# Patient Record
Sex: Male | Born: 1960 | Race: Black or African American | Hispanic: No | Marital: Single | Smoking: Former smoker
Health system: Southern US, Community
[De-identification: ages and names within clinical notes are randomized; demographics above are authoritative.]

## PROBLEM LIST (undated history)

## (undated) DIAGNOSIS — M1990 Unspecified osteoarthritis, unspecified site: Secondary | ICD-10-CM

## (undated) DIAGNOSIS — R011 Cardiac murmur, unspecified: Secondary | ICD-10-CM

## (undated) DIAGNOSIS — G709 Myoneural disorder, unspecified: Secondary | ICD-10-CM

## (undated) DIAGNOSIS — I10 Essential (primary) hypertension: Secondary | ICD-10-CM

## (undated) DIAGNOSIS — T7840XA Allergy, unspecified, initial encounter: Secondary | ICD-10-CM

## (undated) DIAGNOSIS — D649 Anemia, unspecified: Secondary | ICD-10-CM

## (undated) DIAGNOSIS — G4730 Sleep apnea, unspecified: Secondary | ICD-10-CM

## (undated) DIAGNOSIS — H269 Unspecified cataract: Secondary | ICD-10-CM

## (undated) HISTORY — DX: Allergy, unspecified, initial encounter: T78.40XA

## (undated) HISTORY — DX: Sleep apnea, unspecified: G47.30

## (undated) HISTORY — PX: FOOT SURGERY: SHX648

## (undated) HISTORY — DX: Unspecified osteoarthritis, unspecified site: M19.90

## (undated) HISTORY — PX: NASAL SEPTUM SURGERY: SHX37

## (undated) HISTORY — DX: Myoneural disorder, unspecified: G70.9

## (undated) HISTORY — PX: VASECTOMY: SHX75

## (undated) HISTORY — DX: Cardiac murmur, unspecified: R01.1

## (undated) HISTORY — DX: Unspecified cataract: H26.9

---

## 2002-09-25 ENCOUNTER — Emergency Department (HOSPITAL_COMMUNITY): Admission: EM | Admit: 2002-09-25 | Discharge: 2002-09-25 | Payer: Self-pay | Admitting: Emergency Medicine

## 2011-01-07 ENCOUNTER — Emergency Department (HOSPITAL_COMMUNITY): Payer: BC Managed Care – PPO

## 2011-01-07 ENCOUNTER — Inpatient Hospital Stay (HOSPITAL_COMMUNITY)
Admission: EM | Admit: 2011-01-07 | Discharge: 2011-01-10 | DRG: 204 | Disposition: A | Discharge status: Home / Self Care | Payer: BC Managed Care – PPO | Attending: Internal Medicine | Admitting: Internal Medicine

## 2011-01-07 DIAGNOSIS — E871 Hypo-osmolality and hyponatremia: Secondary | ICD-10-CM | POA: Diagnosis present

## 2011-01-07 DIAGNOSIS — K7010 Alcoholic hepatitis without ascites: Secondary | ICD-10-CM | POA: Diagnosis present

## 2011-01-07 DIAGNOSIS — D61818 Other pancytopenia: Secondary | ICD-10-CM | POA: Diagnosis present

## 2011-01-07 DIAGNOSIS — K859 Acute pancreatitis without necrosis or infection, unspecified: Principal | ICD-10-CM | POA: Diagnosis present

## 2011-01-07 DIAGNOSIS — R188 Other ascites: Secondary | ICD-10-CM | POA: Diagnosis present

## 2011-01-07 DIAGNOSIS — I10 Essential (primary) hypertension: Secondary | ICD-10-CM | POA: Diagnosis present

## 2011-01-07 DIAGNOSIS — F1020 Alcohol dependence, uncomplicated: Secondary | ICD-10-CM | POA: Diagnosis present

## 2011-01-07 LAB — CBC
HCT: 31.4 % — ABNORMAL LOW (ref 39.0–52.0)
Hemoglobin: 11.5 g/dL — ABNORMAL LOW (ref 13.0–17.0)
MCH: 33.5 pg (ref 26.0–34.0)
MCHC: 36.6 g/dL — ABNORMAL HIGH (ref 30.0–36.0)
MCV: 91.5 fL (ref 78.0–100.0)
Platelets: 166 10*3/uL (ref 150–400)
RBC: 3.43 MIL/uL — ABNORMAL LOW (ref 4.22–5.81)
RDW: 11.9 % (ref 11.5–15.5)
WBC: 3.4 10*3/uL — ABNORMAL LOW (ref 4.0–10.5)

## 2011-01-07 LAB — DIFFERENTIAL
Basophils Absolute: 0.1 10*3/uL (ref 0.0–0.1)
Basophils Relative: 2 % — ABNORMAL HIGH (ref 0–1)
Eosinophils Absolute: 0.3 10*3/uL (ref 0.0–0.7)
Eosinophils Relative: 8 % — ABNORMAL HIGH (ref 0–5)
Lymphocytes Relative: 30 % (ref 12–46)
Lymphs Abs: 1 10*3/uL (ref 0.7–4.0)
Monocytes Absolute: 0.4 10*3/uL (ref 0.1–1.0)
Monocytes Relative: 11 % (ref 3–12)
Neutro Abs: 1.7 10*3/uL (ref 1.7–7.7)
Neutrophils Relative %: 49 % (ref 43–77)

## 2011-01-07 LAB — URINALYSIS, ROUTINE W REFLEX MICROSCOPIC
Bilirubin Urine: NEGATIVE
Bilirubin Urine: NEGATIVE
Glucose, UA: NEGATIVE mg/dL
Glucose, UA: 100 mg/dL — AB
Hgb urine dipstick: NEGATIVE
Hgb urine dipstick: NEGATIVE
Ketones, ur: NEGATIVE mg/dL
Leukocytes, UA: NEGATIVE
Leukocytes, UA: NEGATIVE
Nitrite: NEGATIVE
Nitrite: NEGATIVE
Protein, ur: NEGATIVE mg/dL
Protein, ur: NEGATIVE mg/dL
Specific Gravity, Urine: 1.02 (ref 1.005–1.030)
Specific Gravity, Urine: 1.045 — ABNORMAL HIGH (ref 1.005–1.030)
Urobilinogen, UA: 0.2 mg/dL (ref 0.0–1.0)
Urobilinogen, UA: 0.2 mg/dL (ref 0.0–1.0)
pH: 6 (ref 5.0–8.0)
pH: 6 (ref 5.0–8.0)

## 2011-01-07 LAB — COMPREHENSIVE METABOLIC PANEL WITH GFR
ALT: 133 U/L — ABNORMAL HIGH (ref 0–53)
AST: 223 U/L — ABNORMAL HIGH (ref 0–37)
Albumin: 5 g/dL (ref 3.5–5.2)
Alkaline Phosphatase: 133 U/L — ABNORMAL HIGH (ref 39–117)
BUN: 10 mg/dL (ref 6–23)
CO2: 23 meq/L (ref 19–32)
Calcium: 9.6 mg/dL (ref 8.4–10.5)
Chloride: 82 meq/L — ABNORMAL LOW (ref 96–112)
Creatinine, Ser: 1.18 mg/dL (ref 0.50–1.35)
GFR calc Af Amer: >60 mL/min
GFR calc non Af Amer: >60 mL/min
Glucose, Bld: 95 mg/dL (ref 70–99)
Potassium: 3.9 meq/L (ref 3.5–5.1)
Sodium: 122 meq/L — ABNORMAL LOW (ref 135–145)
Total Bilirubin: 1 mg/dL (ref 0.3–1.2)
Total Protein: 8.4 g/dL — ABNORMAL HIGH (ref 6.0–8.3)

## 2011-01-07 LAB — RAPID URINE DRUG SCREEN, HOSP PERFORMED
Amphetamines: NOT DETECTED
Barbiturates: NOT DETECTED
Benzodiazepines: NOT DETECTED
Cocaine: NOT DETECTED
Opiates: NOT DETECTED
Tetrahydrocannabinol: NOT DETECTED

## 2011-01-07 LAB — HEPATIC FUNCTION PANEL
ALT: 103 U/L — ABNORMAL HIGH (ref 0–53)
Alkaline Phosphatase: 105 U/L (ref 39–117)
Indirect Bilirubin: 0.4 mg/dL (ref 0.3–0.9)
Total Bilirubin: 0.8 mg/dL (ref 0.3–1.2)

## 2011-01-07 LAB — LIPASE, BLOOD: Lipase: 158 U/L — ABNORMAL HIGH (ref 11–59)

## 2011-01-07 LAB — PROTIME-INR
INR: 1.22 (ref 0.00–1.49)
Prothrombin Time: 15.7 s — ABNORMAL HIGH (ref 11.6–15.2)

## 2011-01-07 MED ORDER — IOHEXOL 300 MG/ML  SOLN
100.0000 mL | Freq: Once | INTRAMUSCULAR | Status: AC | PRN
  Administered 2011-01-07: 100 mL via INTRAVENOUS

## 2011-01-08 LAB — CBC
HCT: 28.6 % — ABNORMAL LOW (ref 39.0–52.0)
MCV: 93.2 fL (ref 78.0–100.0)
Platelets: 125 10*3/uL — ABNORMAL LOW (ref 150–400)
RBC: 3.07 MIL/uL — ABNORMAL LOW (ref 4.22–5.81)
RDW: 11.9 % (ref 11.5–15.5)
WBC: 3.2 10*3/uL — ABNORMAL LOW (ref 4.0–10.5)

## 2011-01-08 LAB — COMPREHENSIVE METABOLIC PANEL
AST: 121 U/L — ABNORMAL HIGH (ref 0–37)
Albumin: 3.9 g/dL (ref 3.5–5.2)
Alkaline Phosphatase: 91 U/L (ref 39–117)
CO2: 23 mEq/L (ref 19–32)
Chloride: 88 mEq/L — ABNORMAL LOW (ref 96–112)
GFR calc non Af Amer: >60 mL/min (ref >60)
Potassium: 4.1 mEq/L (ref 3.5–5.1)
Total Bilirubin: 1.1 mg/dL (ref 0.3–1.2)

## 2011-01-08 LAB — IRON AND TIBC
Saturation Ratios: 36 % (ref 20–55)
UIBC: 171 ug/dL

## 2011-01-09 DIAGNOSIS — F329 Major depressive disorder, single episode, unspecified: Secondary | ICD-10-CM

## 2011-01-09 LAB — URINE CULTURE

## 2011-01-09 LAB — VITAMIN B12: Vitamin B-12: 629 pg/mL (ref 211–911)

## 2011-01-09 LAB — FERRITIN: Ferritin: 2629 ng/mL — ABNORMAL HIGH (ref 22–322)

## 2011-01-09 LAB — FOLATE: Folate: >20 ng/mL

## 2011-01-10 LAB — BASIC METABOLIC PANEL
BUN: 3 mg/dL — ABNORMAL LOW (ref 6–23)
CO2: 25 mEq/L (ref 19–32)
Chloride: 95 mEq/L — ABNORMAL LOW (ref 96–112)
GFR calc non Af Amer: >60 mL/min (ref >60)
Glucose, Bld: 97 mg/dL (ref 70–99)
Potassium: 3.5 mEq/L (ref 3.5–5.1)
Sodium: 130 mEq/L — ABNORMAL LOW (ref 135–145)

## 2011-01-10 NOTE — Consult Note (Signed)
NAMEIMANOL, BIHL NO.:  [id number]  MEDICAL RECORD NO.:  [id number]  LOCATION:  1514                         FACILITY:  [facility]  PHYSICIAN:  Atziri Zubiate T. Meeyah Ovitt, M.D.   DATE OF BIRTH:  08-27-1960  DATE OF CONSULTATION:01/09/11                                 CONSULTATION   HISTORY OF PRESENT ILLNESS:  Patient is a 50 year old, divorced, employed, African-American man, who is admitted to the medical floor after severe nausea and vomiting.  Patient endorsed that he has been drinking heavily in past few weeks due to increased stress and anxiety. He admitted to stress coming from his family member and fiance.  He told his mother who has recently retired and has been sick and there is a lot of financial distress because family is not determined who is paying her medical bills.  Patient admitted he has been drinking heavily to calm himself and tried to go to sleep with alcohol.  Recently, he has been complaining of abdominal pain and apparently found that he has an elevated lipase and he was admitted with possible pancreatitis.  As per chart, patient's brother is very concerned about continued use and heavy use of alcohol.  Patient denies ever being suicidal or any thoughts to taking someone else life.  The patient told that he has history of using alcohol since 80s and he was in [employer]; however, he admitted recently his intake has been increased and at some point out of the control.  He denies any other illegal substance.  Currently, patient is on CIWA protocol for alcohol withdrawal symptoms.  He denies any psychosis, any hallucinations, any violence or agitation.  PAST PSYCHIATRIC HISTORY:  Patient denies any previous psychiatric inpatient treatment or any previous suicidal attempt or any psychiatric medication.  PAST MEDICAL HISTORY: 1. Patient has history of right toe surgery. 2. Sinus surgery. 3. Hypertension. 4. Vasectomy. 5. Recently  pancreatitis.  ALLERGIES:  The patient denies any drug allergies.  PSYCHOSOCIAL HISTORY:  Patient lives by himself.  He is working in a [employer], [employer].  He admitted that job has been very stressful.  He has two daughters, 74 year old and 72 year old, who he has been in contact with them on a regular basis.  Patient is divorced.  ALCOHOL AND SUBSTANCE ABUSE HISTORY:  As mentioned above.  Patient denies any other illegal substances use.  MENTAL STATUS EXAMINATION:  Patient is a middle-aged man, who is wearing the hospital TJ's.  He appears very tired, somewhat sedated.  He continued to endorse pain in is abdomen.  His speech is soft, but clear and coherent.  His thought processes are logical, linear and goal- directed.  He has some tremors and noticeable anxiety, but he denies any auditory hallucinations, suicidal thoughts or homicidal thoughts.  He described his mood as depressed and his affect was constricted, but there were no paranoia, delusions or psychotic symptoms noted.  He is alert and oriented x3.  His thought process was slow, but logical, linear and goal directed.  His attention and concentration were distracted at times, but he was alert and oriented x3.  His insight, judgment and pulse control were okay.  DIAGNOSES:  AXIS I: 1. Alcohol dependence.  2. Depressive disorder, not otherwise specified. AXIS II:  Deferred. AXIS III:  See medical history. AXIS IV:  Mild to moderate. AXIS V:  50-55.  PLAN:  At this time, patient will continue on CIWA protocol.  We did offer voluntary admission to psychiatry unit; however, patient refused; however, he is willing to get treatment as an outpatient as he does not want to lose his job.  I discussed in detail with the possible consequences for continued alcohol use, which he acknowledged.  The patient does not poses any danger to self or other and does not meet criteria for involuntary commitment.  Social worker and case  manager needs a follow-up treatment for chemical dependency outpatient program and a psychiatrist for treatment of depression.  Patient is willing to get those referral and to get help as an outpatient.  Please call us if you have any further questions.     Verdine Grenfell T. Lolly Mustache, M.D.     STA/MEDQ  D:  01/09/2011  T:  01/09/2011  Job:  [id number]  Electronically Signed by Kathryne Sharper M.D. on 01/09/2011 11:59:56 PM

## 2011-01-11 NOTE — H&P (Signed)
[name], BUBLITZ NO.:  [id number]  MEDICAL RECORD NO.:  [id number]  LOCATION:  1514                         FACILITY:  [name]  PHYSICIAN:  Hillery Aldo, M.D.   DATE OF BIRTH:  1960-07-20  DATE OF ADMISSION:  01/07/2011 DATE OF DISCHARGE:                             HISTORY & PHYSICAL   PRIMARY CARE PHYSICIAN:  [place], [name]  CHIEF COMPLAINT:  Abdominal pain.  HISTORY OF PRESENT ILLNESS:  The patient is a 50 year old male with past medical history of alcohol dependency who presented to the hospital for evaluation at the direction of his physicians at the [name], [name]. The patient states that he presented to the [name], [name], yesterday complaining of abdominal pain and apparently the physicians there drew labs and upon review of the labs today, he was found to have an elevated lipase and told to come to the emergency department  The patient states that he has had 3 months of progressive abdominal pain that has worsened over time.  Pain has been associated with nausea and vomiting and it is worse with eating.  He denies any diarrhea, melena, or hematochezia. Pain is intermittent and rated as 8/10 at the worst.  Upon initial evaluation in the emergency department, the patient was found to have an elevated lipase as well as elevated liver function studies and has been referred to the hospitalist service for further evaluation and treatment.  PAST MEDICAL HISTORY:  Hypertension.  PAST SURGICAL HISTORY: 1. Vasectomy. 2. Right toe surgery. 3. Sinus surgery.  FAMILY HISTORY:  The patient's father died at 75 from lung cancer.  The patient's mother is alive at 22 and has heart disease.  He has 4 healthy siblings.  SOCIAL HISTORY:  The patient is divorced and lives alone.  He quit smoking 15-20 years ago.  He drinks approximately 9 cans of beer per day.  He denies any drug use.  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  ALLERGIES:  No known drug allergies.  MEDICATIONS:  Will need to be verified, but he is apparently on advanced relief eyedrops, meloxicam, cetirizine, hydrochlorothiazide, and lisinopril at uncertain doses.  REVIEW OF SYSTEMS:  CONSTITUTIONAL:  No complaints.  HEENT:  No complaints.  CARDIOVASCULAR:  No chest pain or dysrhythmia. RESPIRATORY:  Shortness of breath or cough.  GI:  As per the HPI above. GU:  No dysuria or hematuria.  MUSCULOSKELETAL:  No myalgias or arthralgias.  Comprehensive 14-point review of systems is otherwise unremarkable.  PHYSICAL EXAMINATION:  VITAL SIGNS:  Temperature 97.6, pulse 79, respirations 20, blood pressure 122/83. GENERAL:  A well-developed, well-nourished male who is slightly lethargic, but answers questions appropriately, although slowly. HEENT:  Normocephalic, atraumatic.  PERRL.  EOMI.  Oropharynx is clear. NECK:  Supple.  No thyromegaly.  No lymphadenopathy.  No jugular venous distention. CHEST:  Lungs clear to auscultation bilaterally with good air movement. HEART:  Regular rate, rhythm.  No murmurs, rubs, or gallops. ABDOMEN:  Distended.  Mildly tender.  Positive bowel sounds. EXTREMITIES:  No clubbing, edema, or cyanosis. SKIN:  Warm and dry.  No rashes. NEUROLOGIC:  Somewhat lethargic, but oriented x3.  Cranial nerves II through XII  grossly intact.  Nonfocal.  DATA REVIEW:  CT scan of the abdomen and pelvis shows diffuse hepatic steatosis and mild hepatomegaly, which are nonspecific findings and can be seen with hepatitis or other diffuse hepatic cellular diseases. Small amount of pelvic ascites, which is nonspecific, but may be secondary to hepatocellular disease.  Mild diffuse bladder wall thickening, which is also nonspecific.  Cystitis cannot be excluded.  LABORATORY DATA:  Urinalysis is negative for nitrites and leukocytes. CBC shows a white blood cell count of 3.4, hemoglobin 11.5, hematocrit 31.4, platelets 166.   Serum lipase is 158.  Sodium is 122, potassium 3.9, chloride 82, bicarb 23, BUN 10, creatinine 1.18, glucose 95, calcium 9.6, total bilirubin 1, alkaline phosphatase 133, AST 223, ALT 133, total protein 8.4, albumin 5.0.  ASSESSMENT/PLAN: 1. Alcohol dependency:  We will admit the patient and detox him with     Ativan per the CIWA protocol.  We will supplement him with thiamine     and folic acid.  Psychiatry referral for further outpatient     treatment, once he is medically stable, will be obtained. 2. Acute alcohol-induced pancreatitis:  We will admit the patient and     provide him with IV fluids and bowel rest.  We will provide pain     medications and antiemetics p.r.n. 3. Hyponatremia:  Most likely due to beer drinkers potomania.  We will     hydrate him with normal saline and monitor his electrolytes     closely. 4. Alcohol-induced hepatitis:  We will check viral hepatitis studies     for completeness, but his hepatitis is most likely alcohol induced.     The patient will be counseled and referred for alcohol and drug     treatment services when stable. 5. Probable alcoholic liver disease:  The patient does have likely    alcoholic liver disease, given his alcohol intake and CT scan     findings pertaining to his liver.  We will check a PT and PTT to     determine his synthetic function.  His bilirubin is normal and his     albumin is not low, which is reassuring. 6. Hypertension:  The patient is not currently hypertensive.  We will     monitor his blood pressure closely and initiate treatment if     indicated. 7. Prophylaxis:  The patient will be placed on PAS hoses with early     ambulation for deep venous thrombosis prophylaxis.  Time spent on admission including face-to-face time equals approximately 1 hour.     Hillery Aldo, M.D.     CR/MEDQ  D:  01/07/2011  T:  01/08/2011  Job:  [id number]  cc:   [facility]  Electronically Signed by Hillery Aldo M.D.  on 01/11/2011 06:07:26 PM

## 2011-01-11 NOTE — Discharge Summary (Signed)
[name], [name] NO.:  [id number]  MEDICAL RECORD NO.:  [id number]  LOCATION:  1514                         FACILITY:  [facility]  PHYSICIAN:  Hillery Aldo, M.D.   DATE OF BIRTH:  [birth year]  DATE OF ADMISSION:  01/07/2011 DATE OF DISCHARGE:  01/10/2011                              DISCHARGE SUMMARY   PRIMARY CARE PHYSICIAN:  Dr. Joseph Carrillo at the [place], [name].  DISCHARGE DIAGNOSES: 1. Alcohol dependency. 2. Acute pancreatitis. 3. Hyponatremia secondary to potomania. 4. Alcohol-induced hepatitis. 5. Hepatic steatosis. 6. Ascites. 7. Hypertension. 8. Pancytopenia.  DISCHARGE MEDICATIONS: 1. Clonidine 0.1 mg p.o. b.i.d. 2. Folic acid 1 mg p.o. daily. 3. Thiamine 100 mg p.o. daily. 4. Advanced relief eyedrops 1 drop in both eyes daily. 5. Cetirizine 10 mg p.o. daily. 6. Hydrochlorothiazide 25 mg p.o. daily. 7. Lisinopril 40 mg p.o. daily.  CONSULTATIONS: 1. Syed T. Lolly Mustache, M.D., psychiatry.2. Psychiatric social worker.  BRIEF ADMISSION HPI:  Patient is a 50 year old male, who presented to the hospital with chief complaint of abdominal pain.  Patient apparently was seen by his primary care physician at the [facility] and had labs drawn. Upon review of the labs, he was advised to come to the hospital for an elevated lipase.  Upon initial evaluation in the Emergency Department, the patient was complaining of abdominal pain, rated to 8/10 at the worst and had persistent lipasemia and therefore was referred to the hospitalist service for treatment of acute pancreatitis.  PROCEDURES AND DIAGNOSTIC STUDIES:  CT scan of the abdomen and pelvis on January 07, 2011 showed diffuse hepatic steatosis and mild hepatomegaly, which are nonspecific findings and can be seen with hepatitis or other diffuse hepatocellular diseases.  Small amounts of pelvic ascites, nonspecific.  Mild diffuse bladder wall thickening.  DISCHARGE LABORATORY VALUES:  Sodium was 130, potassium 3.5,  chloride 95, bicarb 25, BUN 3, creatinine 0.85, glucose 97, calcium 9.3.  Urine cultures were negative.  An anemia panel showed iron 97, total iron binding capacity 68, 36% saturation, urine iron-binding capacity 171, vitamin B12 629, serum folate greater than /20, ferritin 2629.  Liver function studies showed a total bilirubin of 1.1, alkaline phosphatase 91, AST 121, ALT 96, total protein 7.3, albumin 3.9, calcium 8.7. Phosphorus is 2.5.  Magnesium was 1.5.  Urine drug screen was negative.  HOSPITAL COURSE BY PROBLEM: 1. Alcohol dependency:  Patient was admitted and put on a detox     protocol with Ativan.  Patient's CIWA scores were monitored closely     and he had improvement of his scores from a range of 5-10 over the     first 24 hours down to zero over the last 24 hours.  Patient was     maintained on thiamine and folic acid and because of his alcohol     dependency, psychiatric consultation was requested and kindly     provided by Dr. Lolly Mustache.  Patient declined inpatient alcohol rehab     and has been seen by the psychiatric social worker who provide him     with outpatient resources for further aftercare.  Patient was     counseled extensively regarding the importance of alcohol cessation  given his liver disease and his need for complete abstinence. 2. Alcohol-induced pancreatitis:  Patient was initially put on bowel     rest and had rapid resolution of his symptoms.  His diet was     advanced and he is tolerating a regular diet at this point with no     risk of resumption of pain. 3. Hyponatremia:  Patient's hyponatremia is felt to be secondary to     beer drinkers potomania.  He was hydrated with normal saline and     ultimately his hydrochlorothiazide was resumed and he had gradual     improvement of his serum sodium from values of 123 in the first 24     hours to 130 at discharge. 4. Alcohol-induced hepatitis:  Patient's liver function studies were     elevated and this  was felt to be due to alcohol-induced hepatitis.     A recheck of his liver function during the course of his hospital     stay showed improvement with abstinence. 5. Alcohol liver disease as evidenced by hepatic steatosis and     ascites:  Patient did have coagulation studies done and although     his PT was mildly elevated, his PTT was normal, his albumin was     normal,, and his bilirubin was not elevated.  He does continue to     have good synthetic function, again, he was advised regarding the     importance of alcohol cessation. 6. Hypertension:  Patient's antihypertensives were initially held.  As     his blood pressure became abnormally elevated, his lisinopril and     hydrochlorothiazide was resumed.  Despite resumption of his normal     antihypertensives, his blood pressure was still elevated and he     ultimately was put on clonidine and will be discharged on clonidine     as well. 7. Pancytopenia:  Patient has a mild pancytopenia and it is felt this     is most likely due to the toxic effects of alcohol on bone marrow.     Patient should follow up with his primary care physician for     ongoing monitoring.  DISPOSITION:  Patient is medically stable and has been cleared for discharge by Psychiatry.  CONDITION ON DISCHARGE:  Improved.  DISCHARGE INSTRUCTIONS:  ACTIVITY:  No restrictions. DIET:  Low sodium heart-healthy.  Avoid alcohol.  HOSPITAL FOLLOWUP:  With Dr. Joseph Carrillo at the [place], [name] in 1-2 weeks.  Patient instructed to call for appointment.  Time spent coordinating care for discharge, discharge instructions including face-to-face time equals approximately 35 minutes.     Hillery Aldo, M.D.     CR/MEDQ  D:  01/10/2011  T:  01/10/2011  Job:  [id number]  cc:   Dr. Joseph Carrillo  Electronically Signed by Hillery Aldo M.D. on 01/11/2011 06:07:32 PM

## 2013-11-12 ENCOUNTER — Emergency Department (HOSPITAL_COMMUNITY): Payer: BC Managed Care – PPO

## 2013-11-12 ENCOUNTER — Encounter (HOSPITAL_COMMUNITY): Payer: Self-pay | Admitting: Emergency Medicine

## 2013-11-12 ENCOUNTER — Inpatient Hospital Stay (HOSPITAL_COMMUNITY)
Admission: EM | Admit: 2013-11-12 | Discharge: 2013-11-14 | DRG: 682 | Disposition: A | Discharge status: Home / Self Care | Payer: BC Managed Care – PPO | Attending: Family Medicine | Admitting: Family Medicine

## 2013-11-12 DIAGNOSIS — I10 Essential (primary) hypertension: Secondary | ICD-10-CM | POA: Diagnosis present

## 2013-11-12 DIAGNOSIS — K859 Acute pancreatitis without necrosis or infection, unspecified: Secondary | ICD-10-CM | POA: Diagnosis present

## 2013-11-12 DIAGNOSIS — R55 Syncope and collapse: Secondary | ICD-10-CM

## 2013-11-12 DIAGNOSIS — Z87891 Personal history of nicotine dependence: Secondary | ICD-10-CM

## 2013-11-12 DIAGNOSIS — I951 Orthostatic hypotension: Secondary | ICD-10-CM | POA: Diagnosis present

## 2013-11-12 DIAGNOSIS — N179 Acute kidney failure, unspecified: Principal | ICD-10-CM

## 2013-11-12 DIAGNOSIS — N19 Unspecified kidney failure: Secondary | ICD-10-CM | POA: Diagnosis present

## 2013-11-12 HISTORY — DX: Essential (primary) hypertension: I10

## 2013-11-12 LAB — I-STAT CHEM 8, ED
BUN: 11 mg/dL (ref 6–23)
CALCIUM ION: 1.01 mmol/L — AB (ref 1.12–1.23)
Chloride: 97 mEq/L (ref 96–112)
Creatinine, Ser: 2.9 mg/dL — ABNORMAL HIGH (ref 0.50–1.35)
Glucose, Bld: 97 mg/dL (ref 70–99)
HCT: 42 % (ref 39.0–52.0)
HEMOGLOBIN: 14.3 g/dL (ref 13.0–17.0)
Potassium: 3.9 mEq/L (ref 3.7–5.3)
Sodium: 138 mEq/L (ref 137–147)
TCO2: 25 mmol/L (ref 0–100)

## 2013-11-12 LAB — URINALYSIS, ROUTINE W REFLEX MICROSCOPIC
Bilirubin Urine: NEGATIVE
Glucose, UA: NEGATIVE mg/dL
KETONES UR: NEGATIVE mg/dL
Leukocytes, UA: NEGATIVE
NITRITE: NEGATIVE
Protein, ur: 30 mg/dL — AB
Specific Gravity, Urine: 1.017 (ref 1.005–1.030)
UROBILINOGEN UA: 1 mg/dL (ref 0.0–1.0)
pH: 5.5 (ref 5.0–8.0)

## 2013-11-12 LAB — TROPONIN I: Troponin I: <0.3 ng/mL (ref <0.30)

## 2013-11-12 LAB — COMPREHENSIVE METABOLIC PANEL
ALT: 56 U/L — ABNORMAL HIGH (ref 0–53)
AST: 116 U/L — ABNORMAL HIGH (ref 0–37)
Albumin: 4.2 g/dL (ref 3.5–5.2)
Alkaline Phosphatase: 97 U/L (ref 39–117)
BILIRUBIN TOTAL: 1.3 mg/dL — AB (ref 0.3–1.2)
BUN: 11 mg/dL (ref 6–23)
CALCIUM: 8.9 mg/dL (ref 8.4–10.5)
CO2: 22 meq/L (ref 19–32)
CREATININE: 2.45 mg/dL — AB (ref 0.50–1.35)
Chloride: 94 mEq/L — ABNORMAL LOW (ref 96–112)
GFR, EST AFRICAN AMERICAN: 33 mL/min — AB (ref >90)
GFR, EST NON AFRICAN AMERICAN: 29 mL/min — AB (ref >90)
Glucose, Bld: 96 mg/dL (ref 70–99)
Potassium: 3.9 mEq/L (ref 3.7–5.3)
Sodium: 138 mEq/L (ref 137–147)
Total Protein: 8.5 g/dL — ABNORMAL HIGH (ref 6.0–8.3)

## 2013-11-12 LAB — CBC
HCT: 36.3 % — ABNORMAL LOW (ref 39.0–52.0)
HEMOGLOBIN: 13 g/dL (ref 13.0–17.0)
MCH: 33 pg (ref 26.0–34.0)
MCHC: 35.8 g/dL (ref 30.0–36.0)
MCV: 92.1 fL (ref 78.0–100.0)
Platelets: 161 10*3/uL (ref 150–400)
RBC: 3.94 MIL/uL — AB (ref 4.22–5.81)
RDW: 12.6 % (ref 11.5–15.5)
WBC: 7.6 10*3/uL (ref 4.0–10.5)

## 2013-11-12 LAB — URINE MICROSCOPIC-ADD ON

## 2013-11-12 LAB — LIPASE, BLOOD: LIPASE: 87 U/L — AB (ref 11–59)

## 2013-11-12 LAB — I-STAT TROPONIN, ED: Troponin i, poc: 0 ng/mL (ref 0.00–0.08)

## 2013-11-12 MED ORDER — HYDROCHLOROTHIAZIDE 25 MG PO TABS
12.5000 mg | ORAL_TABLET | Freq: Every day | ORAL | Status: DC
  Filled 2013-11-12: qty 0.5

## 2013-11-12 MED ORDER — SODIUM CHLORIDE 0.45 % IV SOLN
INTRAVENOUS | Status: DC
  Administered 2013-11-12 – 2013-11-14 (×2): via INTRAVENOUS

## 2013-11-12 MED ORDER — SODIUM CHLORIDE 0.9 % IV SOLN
INTRAVENOUS | Status: DC
  Administered 2013-11-12: 23:00:00 via INTRAVENOUS

## 2013-11-12 MED ORDER — HEPARIN SODIUM (PORCINE) 5000 UNIT/ML IJ SOLN
5000.0000 [IU] | Freq: Three times a day (TID) | INTRAMUSCULAR | Status: DC
  Administered 2013-11-12 – 2013-11-14 (×5): 5000 [IU] via SUBCUTANEOUS
  Filled 2013-11-12 (×8): qty 1

## 2013-11-12 MED ORDER — ACETAMINOPHEN 650 MG RE SUPP: 650.0000 mg | Freq: Four times a day (QID) | RECTAL | Status: DC | PRN

## 2013-11-12 MED ORDER — PANTOPRAZOLE SODIUM 40 MG PO TBEC
40.0000 mg | DELAYED_RELEASE_TABLET | Freq: Every day | ORAL | Status: DC
Start: 2013-11-13 — End: 2013-11-14
  Administered 2013-11-13 – 2013-11-14 (×2): 40 mg via ORAL
  Filled 2013-11-12 (×2): qty 1

## 2013-11-12 MED ORDER — SODIUM CHLORIDE 0.9 % IV BOLUS (SEPSIS)
1000.0000 mL | Freq: Once | INTRAVENOUS | Status: AC
  Administered 2013-11-12: 1000 mL via INTRAVENOUS

## 2013-11-12 MED ORDER — ACETAMINOPHEN 325 MG PO TABS: 650.0000 mg | ORAL_TABLET | Freq: Four times a day (QID) | ORAL | Status: DC | PRN

## 2013-11-12 NOTE — ED Notes (Signed)
Pt. Is aware of needing a urine specimen.   Urinal at bedside

## 2013-11-12 NOTE — ED Notes (Signed)
Pt arrived per EMS related to near syncopal episode. Per EMS pt bp 60/38 900 ml bolus given en route. Pt alert x4 respirations easy non labored.

## 2013-11-12 NOTE — ED Notes (Signed)
Recorded room number for patient and family.

## 2013-11-12 NOTE — ED Notes (Signed)
Patient preparing to go to CT.

## 2013-11-12 NOTE — ED Provider Notes (Signed)
CSN: [id number]     Arrival date & time 11/12/13  1628 History   First MD Initiated Contact with Patient 11/12/13 1711     Chief Complaint  Patient presents with  . Near Syncope     (Consider location/radiation/quality/duration/timing/severity/associated sxs/prior Treatment) HPI Comments: Patient complains of syncopal episode while working at the [employer]. He was working in the mail room pushing a cart when he began to feel lightheaded dizzy and nauseated. She sat down for a few minutes and felt better. He tried to go back to work where he felt dizzy and nauseated again and next thing he knows he was on the floor. Denies any chest pain or shortness of breath. He is not certain if he hit his head. He was found to have a blood pressure of 60s systolic for EMS. Denies any recent illness. No nausea, vomiting, fever, diarrhea. He takes lisinopril for hypertension at home. Denies any focal weakness, numbness or tingling. No bowel bladder incontinence or tongue biting. He feels back to baseline now. Denies any dizziness. States he didn't eat very much today. He says he did drink normally  The history is provided by the patient and the EMS personnel.    Past Medical History  Diagnosis Date  . Hypertension    Past Surgical History  Procedure Laterality Date  . Foot surgery Right   . Nasal septum surgery    . Vasectomy     Family History  Problem Relation Age of Onset  . Atrial fibrillation Mother   . CVA Mother   . Hypertension Mother   . Emphysema Mother    History  Substance Use Topics  . Smoking status: Former Smoker    Types: Cigarettes  . Smokeless tobacco: Not on file  . Alcohol Use: Yes     Comment: occasional    Review of Systems  Constitutional: Positive for activity change, appetite change and fatigue. Negative for fever.  HENT: Negative for congestion and rhinorrhea.   Respiratory: Negative for cough, chest tightness and shortness of breath.   Cardiovascular:  Positive for near-syncope. Negative for chest pain and leg swelling.  Gastrointestinal: Positive for nausea. Negative for vomiting and abdominal pain.  Genitourinary: Negative for dysuria and hematuria.  Musculoskeletal: Negative for arthralgias.  Skin: Negative for rash.  Neurological: Positive for dizziness, weakness and light-headedness. Negative for headaches.  A complete 10 system review of systems was obtained and all systems are negative except as noted in the HPI and PMH.      Allergies  Lactose intolerance (gi)  Home Medications   Prior to Admission medications   Medication Sig Start Date End Date Taking? Authorizing Provider  hydrochlorothiazide (HYDRODIURIL) 12.5 MG tablet Take 12.5 mg by mouth daily.   Yes Historical Provider, MD  lisinopril (PRINIVIL,ZESTRIL) 40 MG tablet Take 40 mg by mouth daily.   Yes Historical Provider, MD  omeprazole (PRILOSEC) 20 MG capsule Take 20 mg by mouth daily.   Yes Historical Provider, MD   BP 127/90  Pulse 83  Temp(Src) 98.9 F (37.2 C) (Oral)  Resp 18  Ht 6' (1.829 m)  Wt 199 lb 3.2 oz (90.357 kg)  BMI 27.01 kg/m2  SpO2 100% Physical Exam  Constitutional: He is oriented to person, place, and time. He appears well-developed and well-nourished. No distress.  HENT:  Head: Normocephalic and atraumatic.  Mouth/Throat: Oropharynx is clear and moist. No oropharyngeal exudate.  Eyes: Conjunctivae and EOM are normal. Pupils are equal, round, and reactive to light.  Neck: Normal range of motion. Neck supple.  Cardiovascular: Normal rate, regular rhythm, normal heart sounds and intact distal pulses.   No murmur heard. Pulmonary/Chest: Effort normal and breath sounds normal. No respiratory distress.  Abdominal: Soft. There is no tenderness. There is no rebound and no guarding.  Musculoskeletal: Normal range of motion. He exhibits no edema and no tenderness.  Neurological: He is alert and oriented to person, place, and time. No cranial  nerve deficit. He exhibits normal muscle tone. Coordination normal.  CN 2-12 intact, no ataxia on finger to nose, no nystagmus, 5/5 strength throughout, no pronator drift, Romberg negative, normal gait.   Skin: Skin is warm.    ED Course  Procedures (including critical care time) Labs Review Labs Reviewed  CBC - Abnormal; Notable for the following:    RBC 3.94 (*)    HCT 36.3 (*)    All other components within normal limits  COMPREHENSIVE METABOLIC PANEL - Abnormal; Notable for the following:    Chloride 94 (*)    Creatinine, Ser 2.45 (*)    Total Protein 8.5 (*)    AST 116 (*)    ALT 56 (*)    Total Bilirubin 1.3 (*)    GFR calc non Af Amer 29 (*)    GFR calc Af Amer 33 (*)    All other components within normal limits  LIPASE, BLOOD - Abnormal; Notable for the following:    Lipase 87 (*)    All other components within normal limits  URINALYSIS, ROUTINE W REFLEX MICROSCOPIC - Abnormal; Notable for the following:    APPearance CLOUDY (*)    Hgb urine dipstick SMALL (*)    Protein, ur 30 (*)    All other components within normal limits  URINE MICROSCOPIC-ADD ON - Abnormal; Notable for the following:    Casts GRANULAR CAST (*)    All other components within normal limits  I-STAT CHEM 8, ED - Abnormal; Notable for the following:    Creatinine, Ser 2.90 (*)    Calcium, Ion 1.01 (*)    All other components within normal limits  TROPONIN I  COMPREHENSIVE METABOLIC PANEL  SODIUM, URINE, RANDOM  CREATININE, URINE, RANDOM  I-STAT TROPOININ, ED    Imaging Review Ct Head Wo Contrast  11/12/2013   CLINICAL DATA:  Near syncopal episode.  EXAM: CT HEAD WITHOUT CONTRAST  CT CERVICAL SPINE WITHOUT CONTRAST  TECHNIQUE: Multidetector CT imaging of the head and cervical spine was performed following the standard protocol without intravenous contrast. Multiplanar CT image reconstructions of the cervical spine were also generated.  COMPARISON:  None.  FINDINGS: CT HEAD FINDINGS  The  ventricles are normal in size and position. There is no intracranial hemorrhage or intracranial mass effect. There is punctate basal ganglia calcification on the right. There is no evidence of an evolving ischemic infarction. The cerebellum and brainstem are normal in density.  At bone window settings the observed portions of the paranasal sinuses and mastoid air cells are clear. There is no evidence of an acute skull fracture.  CT CERVICAL SPINE FINDINGS  The cervical vertebral bodies are preserved in height. The intervertebral disc space heights are well maintained. The prevertebral soft tissue spaces appear normal. The odontoid is intact and the lateral masses of C1 align normally with those of C2. There is no perched facet nor spinous process fracture. The bony ring at each cervical level is intact. The observed portions of the first and second ribs appear normal. The pulmonary apices  are clear. The soft tissues of the neck exhibit no acute abnormalities.  IMPRESSION: 1. There is no acute intracranial hemorrhage nor evidence of an evolving ischemic event nor other acute intracranial abnormality. There are very mild age-appropriate atrophic changes of the cerebrum. 2. There is no evidence of an acute cervical spine fracture nor dislocation. No more than mild age-appropriate degenerative change is present.   Electronically Signed   By: David  [place]   On: 11/12/2013 20:07   Ct Cervical Spine Wo Contrast  11/12/2013   CLINICAL DATA:  Near syncopal episode.  EXAM: CT HEAD WITHOUT CONTRAST  CT CERVICAL SPINE WITHOUT CONTRAST  TECHNIQUE: Multidetector CT imaging of the head and cervical spine was performed following the standard protocol without intravenous contrast. Multiplanar CT image reconstructions of the cervical spine were also generated.  COMPARISON:  None.  FINDINGS: CT HEAD FINDINGS  The ventricles are normal in size and position. There is no intracranial hemorrhage or intracranial mass effect. There is  punctate basal ganglia calcification on the right. There is no evidence of an evolving ischemic infarction. The cerebellum and brainstem are normal in density.  At bone window settings the observed portions of the paranasal sinuses and mastoid air cells are clear. There is no evidence of an acute skull fracture.  CT CERVICAL SPINE FINDINGS  The cervical vertebral bodies are preserved in height. The intervertebral disc space heights are well maintained. The prevertebral soft tissue spaces appear normal. The odontoid is intact and the lateral masses of C1 align normally with those of C2. There is no perched facet nor spinous process fracture. The bony ring at each cervical level is intact. The observed portions of the first and second ribs appear normal. The pulmonary apices are clear. The soft tissues of the neck exhibit no acute abnormalities.  IMPRESSION: 1. There is no acute intracranial hemorrhage nor evidence of an evolving ischemic event nor other acute intracranial abnormality. There are very mild age-appropriate atrophic changes of the cerebrum. 2. There is no evidence of an acute cervical spine fracture nor dislocation. No more than mild age-appropriate degenerative change is present.   Electronically Signed   By: David  [place]   On: 11/12/2013 20:07   [place] Aorta  11/12/2013   CLINICAL DATA:  Hypotension  EXAM: ULTRASOUND OF ABDOMINAL AORTA  TECHNIQUE: Ultrasound examination of the abdominal aorta was performed to evaluate for abdominal aortic aneurysm.  COMPARISON:  CT abdomen and pelvis January 07, 2012  FINDINGS: Abdominal Aorta  The aorta tapers distally as is expected. In the distal aorta, the maximum transverse diameter is 2.3 cm. The maximum AP diameter is 1.5 cm. There is no abdominal aortic aneurysm. There is no periaortic fluid or adenopathy. Doppler signal is biphasic within the aorta, normal. The proximal common iliac arteries are non aneurysmal. There is no evidence of ascites in the visualized  regions.  IMPRESSION: No demonstrable abdominal aortic aneurysm.  No periaortic fluid.   Electronically Signed   By: Bretta BangWilliam  Woodruff M.D.   On: 11/12/2013 19:05     EKG Interpretation None      MDM   Final diagnoses:  Acute renal failure  Syncope  Orthostatic hypotension   Syncopal episode at work with episodes of hypotension that resolved with IV fluids. No chest pain or shortness of breath.  EKG without ST changes. No brugada. No prolonged QT.  Blood pressure stabilized in the 120 systolic. Heart rate remains around 100. Acute renal failure with creatinine of 2.9. Last value 0.8  in 2012.  No AAA on US. CT head and C  Spine negative.  Patient asymptomatic in ED. Renal injury possible due to transient hypotension.  Syncope likely orthostatic per report. No arrhythmias in the ED. No seizure activity. [facility] will [facility] for renal failure.   Date: 11/12/2013  Rate: 91  Rhythm: normal sinus rhythm  QRS Axis: normal  Intervals: normal  ST/T Wave abnormalities: nonspecific ST/T changes  Conduction Disutrbances:none  Narrative Interpretation: no brugada, no prolonged QT  Old EKG Reviewed: none available    Glynn OctaveStephen Thaddius Manes, MD 11/13/13 [phone]

## 2013-11-12 NOTE — H&P (Signed)
[facility] and Physical Service Pager: [phone]  Patient name: [name] Medical record number: [id number] Date of birth: 08/05/1960 Age: 53 y.o. Gender: male  Primary Care Provider: Pcp Not In System Consultants: None Code Status: Full code  Chief Complaint: Syncope  Assessment and Plan: [name] is a 53 y.o. male presenting with syncope x1 . PMH is significant for hypertension  # Syncope: unsure of etiology. Could be vasovagal vs orthostatic hypotension vs arrhythmia vs seizure vs ETOH or drug induced weakness. History does not support seizure as patient has no prior history, no post-ictal state, no rhythmic movements or incontinence. Patient has no known history of heart arrythmia and is currently sinus rhythm. Orthostatic vitals in ED were negative. Pt endorses small daily ETOH consumption and w/ transaminitis and mild pancreatitis on labs on admission  Admit to inpatient, telemetry, attending physician Dr. Armen PickupFunches  Continuous cardiac monitoring; consider echocardiogram if events noticed overnight  Repeat EKG in AM  Repeat orthostatic vitals in AM  UDS and ETOH level  # Kidney injury: last creatinine in 2012 of 0.85. Creatinine on admission: 2.45. Patient states he has not been told of any kidney problems by his PCP. Unknown if this is acute or chronic. Could be chronic from hypertension, although patient reports good control of blood pressures (130s SBPs). Could also be acute from possible hypoperfusion with additional insult from lisinopril.  IV hydration in addition to regular diet  Follow-up FENa (urine sodium and creatinine; serum sodium and creatinine)  Follow-up CMet in AM  Hold Lisinopril as pressure nml and in face of acute injury  Consider outpt renal f/u if remains elevated  # Hypertension: on lisinopril and HCTZ at home. Pressure well within the normal range on admission and even reported hypotension  per EMS of sbp of 60 (??).  Discontinue home lisinopril and will not continue on discharge  Continue home HCTZ  # Elevated transaminases: currently down from 2012 values. Most likely from recent alcohol consumption. Question amount of ETOH consumption.  Follow-up Cmet in AM to trend  ETOH level  FEN/GI: Heart healthy, 1/2NS @100 , continue home PPI Prophylaxis: Heparin subq  Disposition: Admit to telemetry  History of Present Illness: [name] is a 53 y.o. male presenting with syncope.  Patient reports going to work today and pushing a heavy cart. Shortly after, as he walked away, he was feeling lightheaded and the lights had a glare when he looked at them. The next thing he knew, he was on the ground. This episode was witnessed by his co-workers. He did not have any rhythmic body movements, no bladder/bowel incontinence, no chest pain, palpitations, shortness of breath, nausea or vomiting. He reports no post-ictal state. He does not remember taking more blood pressure medication than normal.   En route to the hospital, blood pressure was 60/38 per EMS report and received 900ml NS bolus. Received 1L bolus in the ED. Orthostatic vitals obtained and negative. CT head showed mild age-appropriate degenerative changes. Abdominal U/S obtained to assess abdominal aorta and was negative. CT neck was normal.   Review Of Systems: Per HPI with the following additions: None Otherwise 12 point review of systems was performed and was unremarkable.  There are no active problems to display for this patient.  Past Medical History: Past Medical History  Diagnosis Date  . Hypertension    Past Surgical History: Past Surgical History  Procedure Laterality Date  . Foot surgery Right   . Nasal septum surgery    .  Vasectomy     Social History: History  Substance Use Topics  . Smoking status: Former Smoker    Types: Cigarettes  . Smokeless tobacco: Not on file  . Alcohol Use: Yes      Comment: occasional   Additional social history: None  Please also refer to relevant sections of EMR.  Family History: Family History  Problem Relation Age of Onset  . Atrial fibrillation Mother   . CVA Mother   . Hypertension Mother   . Emphysema Mother    Allergies and Medications: Allergies  Allergen Reactions  . Lactose Intolerance (Gi)     unknown   No current facility-administered medications on file prior to encounter.   No current outpatient prescriptions on file prior to encounter.    Objective: BP 119/82  Pulse 84  Temp(Src) 98.4 F (36.9 C) (Oral)  Resp 16  SpO2 99%  Exam: General: Laying in bed, in no acute distress, brother and friend at bedside HEENT: PERRL, EOMI, moist mucous membranes Cardiovascular: Regular rate/rhythm, no murmur, 2+ pulses distally Respiratory: Clear to auscultation bilaterally, no wheezing Abdomen: Soft, non-tender, non-distended Extremities: No cyanosis, no calftenderness Skin: Warm and well perfused Neuro: cranial nerves intact, alert, oriented x3, 5/5 upper and lower extremity strength bilaterally  Labs and Imaging:  CBC    Component Value Date/Time   WBC 7.6 11/12/2013 1643   RBC 3.94* 11/12/2013 1643   HGB 14.3 11/12/2013 1714   HCT 42.0 11/12/2013 1714   PLT 161 11/12/2013 1643   MCV 92.1 11/12/2013 1643   MCH 33.0 11/12/2013 1643   MCHC 35.8 11/12/2013 1643   RDW 12.6 11/12/2013 1643   LYMPHSABS 1.0 01/07/2011 1317   MONOABS 0.4 01/07/2011 1317   EOSABS 0.3 01/07/2011 1317   BASOSABS 0.1 01/07/2011 1317   CMP     Component Value Date/Time   NA 138 11/12/2013 1724   K 3.9 11/12/2013 1724   CL 94* 11/12/2013 1724   CO2 22 11/12/2013 1724   GLUCOSE 96 11/12/2013 1724   BUN 11 11/12/2013 1724   CREATININE 2.45* 11/12/2013 1724   CALCIUM 8.9 11/12/2013 1724   PROT 8.5* 11/12/2013 1724   ALBUMIN 4.2 11/12/2013 1724   AST 116* 11/12/2013 1724   ALT 56* 11/12/2013 1724   ALKPHOS 97 11/12/2013 1724   BILITOT 1.3* 11/12/2013 1724    GFRNONAA 29* 11/12/2013 1724   GFRAA 33* 11/12/2013 1724   Urinalysis    Component Value Date/Time   COLORURINE YELLOW 11/12/2013 2056   APPEARANCEUR CLOUDY* 11/12/2013 2056   LABSPEC 1.017 11/12/2013 2056   PHURINE 5.5 11/12/2013 2056   GLUCOSEU NEGATIVE 11/12/2013 2056   HGBUR SMALL* 11/12/2013 2056   BILIRUBINUR NEGATIVE 11/12/2013 2056   KETONESUR NEGATIVE 11/12/2013 2056   PROTEINUR 30* 11/12/2013 2056   UROBILINOGEN 1.0 11/12/2013 2056   NITRITE NEGATIVE 11/12/2013 2056   LEUKOCYTESUR NEGATIVE 11/12/2013 2056   Ct Head Wo Contrast  11/12/2013   CLINICAL DATA:  Near syncopal episode.  EXAM: CT HEAD WITHOUT CONTRAST  CT CERVICAL SPINE WITHOUT CONTRAST  TECHNIQUE: Multidetector CT imaging of the head and cervical spine was performed following the standard protocol without intravenous contrast. Multiplanar CT image reconstructions of the cervical spine were also generated.  COMPARISON:  None.  FINDINGS: CT HEAD FINDINGS  The ventricles are normal in size and position. There is no intracranial hemorrhage or intracranial mass effect. There is punctate basal ganglia calcification on the right. There is no evidence of an evolving ischemic infarction. The cerebellum  and brainstem are normal in density.  At bone window settings the observed portions of the paranasal sinuses and mastoid air cells are clear. There is no evidence of an acute skull fracture.  CT CERVICAL SPINE FINDINGS  The cervical vertebral bodies are preserved in height. The intervertebral disc space heights are well maintained. The prevertebral soft tissue spaces appear normal. The odontoid is intact and the lateral masses of C1 align normally with those of C2. There is no perched facet nor spinous process fracture. The bony ring at each cervical level is intact. The observed portions of the first and second ribs appear normal. The pulmonary apices are clear. The soft tissues of the neck exhibit no acute abnormalities.  IMPRESSION: 1. There is no  acute intracranial hemorrhage nor evidence of an evolving ischemic event nor other acute intracranial abnormality. There are very mild age-appropriate atrophic changes of the cerebrum. 2. There is no evidence of an acute cervical spine fracture nor dislocation. No more than mild age-appropriate degenerative change is present.   Electronically Signed   By: David  [place]   On: 11/12/2013 20:07   Ct Cervical Spine Wo Contrast  11/12/2013   CLINICAL DATA:  Near syncopal episode.  EXAM: CT HEAD WITHOUT CONTRAST  CT CERVICAL SPINE WITHOUT CONTRAST  TECHNIQUE: Multidetector CT imaging of the head and cervical spine was performed following the standard protocol without intravenous contrast. Multiplanar CT image reconstructions of the cervical spine were also generated.  COMPARISON:  None.  FINDINGS: CT HEAD FINDINGS  The ventricles are normal in size and position. There is no intracranial hemorrhage or intracranial mass effect. There is punctate basal ganglia calcification on the right. There is no evidence of an evolving ischemic infarction. The cerebellum and brainstem are normal in density.  At bone window settings the observed portions of the paranasal sinuses and mastoid air cells are clear. There is no evidence of an acute skull fracture.  CT CERVICAL SPINE FINDINGS  The cervical vertebral bodies are preserved in height. The intervertebral disc space heights are well maintained. The prevertebral soft tissue spaces appear normal. The odontoid is intact and the lateral masses of C1 align normally with those of C2. There is no perched facet nor spinous process fracture. The bony ring at each cervical level is intact. The observed portions of the first and second ribs appear normal. The pulmonary apices are clear. The soft tissues of the neck exhibit no acute abnormalities.  IMPRESSION: 1. There is no acute intracranial hemorrhage nor evidence of an evolving ischemic event nor other acute intracranial abnormality.  There are very mild age-appropriate atrophic changes of the cerebrum. 2. There is no evidence of an acute cervical spine fracture nor dislocation. No more than mild age-appropriate degenerative change is present.   Electronically Signed   By: David  [place]   On: 11/12/2013 20:07   US Aorta  11/12/2013   CLINICAL DATA:  Hypotension  EXAM: ULTRASOUND OF ABDOMINAL AORTA  TECHNIQUE: Ultrasound examination of the abdominal aorta was performed to evaluate for abdominal aortic aneurysm.  COMPARISON:  CT abdomen and pelvis January 07, 2012  FINDINGS: Abdominal Aorta  The aorta tapers distally as is expected. In the distal aorta, the maximum transverse diameter is 2.3 cm. The maximum AP diameter is 1.5 cm. There is no abdominal aortic aneurysm. There is no periaortic fluid or adenopathy. Doppler signal is biphasic within the aorta, normal. The proximal common iliac arteries are non aneurysmal. There is no evidence of ascites in the  visualized regions.  IMPRESSION: No demonstrable abdominal aortic aneurysm.  No periaortic fluid.   Electronically Signed   By: Bretta Bang M.D.   On: 11/12/2013 19:05    Jacquelin Hawking, MD 11/12/2013, 10:01 PM PGY-1, [facility] Family Medicine FPTS Intern pager: [phone], text pages welcome  ------------------------------------------------------------------------  Upper Level Addendum  Pt seen and examined with Dr. Caleb Popp and agree with above H&P w/ additions noted in [facility]  Shelly Flatten, MD Family Medicine PGY-3 11/13/2013, 6:20 AM

## 2013-11-12 NOTE — ED Notes (Signed)
Discussed with patient the need to void for urine sample.  Patient acknowledges, reports no urge at this time.  Will provide water to encourage urine output.

## 2013-11-13 ENCOUNTER — Inpatient Hospital Stay (HOSPITAL_COMMUNITY): Payer: BC Managed Care – PPO

## 2013-11-13 LAB — COMPREHENSIVE METABOLIC PANEL
ALT: 49 U/L (ref 0–53)
AST: 90 U/L — AB (ref 0–37)
Albumin: 3.7 g/dL (ref 3.5–5.2)
Alkaline Phosphatase: 93 U/L (ref 39–117)
BUN: 14 mg/dL (ref 6–23)
CALCIUM: 8.3 mg/dL — AB (ref 8.4–10.5)
CO2: 25 mEq/L (ref 19–32)
Chloride: 97 mEq/L (ref 96–112)
Creatinine, Ser: 1.7 mg/dL — ABNORMAL HIGH (ref 0.50–1.35)
GFR calc Af Amer: 52 mL/min — ABNORMAL LOW (ref >90)
GFR calc non Af Amer: 45 mL/min — ABNORMAL LOW (ref >90)
GLUCOSE: 96 mg/dL (ref 70–99)
Potassium: 3.7 mEq/L (ref 3.7–5.3)
SODIUM: 137 meq/L (ref 137–147)
TOTAL PROTEIN: 7.6 g/dL (ref 6.0–8.3)
Total Bilirubin: 1.8 mg/dL — ABNORMAL HIGH (ref 0.3–1.2)

## 2013-11-13 LAB — RAPID URINE DRUG SCREEN, HOSP PERFORMED
AMPHETAMINES: NOT DETECTED
BENZODIAZEPINES: NOT DETECTED
Barbiturates: NOT DETECTED
COCAINE: NOT DETECTED
OPIATES: NOT DETECTED
TETRAHYDROCANNABINOL: NOT DETECTED

## 2013-11-13 LAB — TSH: TSH: 1.07 u[IU]/mL (ref 0.350–4.500)

## 2013-11-13 LAB — ETHANOL

## 2013-11-13 LAB — SODIUM, URINE, RANDOM: Sodium, Ur: 68 mEq/L

## 2013-11-13 LAB — CREATININE, URINE, RANDOM: Creatinine, Urine: 377.45 mg/dL

## 2013-11-13 MED ORDER — HYDROCHLOROTHIAZIDE 12.5 MG PO CAPS
12.5000 mg | ORAL_CAPSULE | Freq: Every day | ORAL | Status: DC
  Administered 2013-11-13: 12.5 mg via ORAL
  Filled 2013-11-13 (×2): qty 1

## 2013-11-13 NOTE — Progress Notes (Signed)
*  PRELIMINARY RESULTS* Echocardiogram 2D Echocardiogram has been performed.  Bradley PullerJohanna R Griselda Carrillo 11/13/2013, 12:27 PM

## 2013-11-13 NOTE — Progress Notes (Signed)
Family Medicine Teaching Service Daily Progress Note Intern Pager: [phone]  Patient name: [name] Diers Medical record number: [id number] Date of birth: 08/09/1960 Age: 53 y.o. Gender: male  Primary Care Provider: Pcp Not In System Consultants: None Code Status: Full code  Pt Overview and Major Events to Date:  4/28: patient admitted  Assessment and Plan: [name] Cavalieri is a 53 y.o. male presenting with syncope x1 . PMH is significant for hypertension   # Syncope: unsure of etiology. Could be vasovagal vs orthostatic hypotension vs arrhythmia vs seizure vs ETOH or drug induced weakness. History does not support seizure as patient has no prior history, no post-ictal state, no rhythmic movements or incontinence. Patient has no known history of heart arrythmia and is currently sinus rhythm. Orthostatic vitals in ED were negative. Pt endorses small daily ETOH consumption and w/ transaminitis and mild pancreatitis on labs on admission. UDS and EtOH level normal. Orthostatics negative this morning. EKG normal sinus rhythm. Continuous cardiac monitoring  # Kidney injury: last creatinine in 2012 of 0.85. Creatinine on admission: 2.45. Current creatinine 1.70. FENa: 0.2% IV hydration in addition to regular diet Consider outpt renal f/u if remains elevated Lisinopril held as in below problem Daily Cmet  # Hypertension: on lisinopril and HCTZ at home. Pressure well within the normal range on admission and even reported hypotension per EMS of sbp of 60 (??).  Discontinue home lisinopril and will not continue on discharge  Continue home HCTZ  # Elevated transaminases: currently down from 2012 values. Most likely from recent alcohol consumption. Question amount of ETOH consumption. EtOH level normal. Trend with Cmet  Disposition: Probably discharge home tomorrow pending improvement of probable AKI  Subjective:  Patient reports no complaints today. No lightheadedness, chest pain, shortness of  breath, palpitations, nausea/vomiting or dysuria.  Objective: Temp:  [98.4 F (36.9 C)-99 F (37.2 C)] 99 F (37.2 C) (04/29 0517) Pulse Rate:  [83-110] 108 (04/29 0523) Resp:  [14-24] 18 (04/29 0517) BP: (103-139)/(67-100) 139/100 mmHg (04/29 0523) SpO2:  [97 %-100 %] 99 % (04/29 0517) Weight:  [199 lb 3.2 oz (90.357 kg)] 199 lb 3.2 oz (90.357 kg) (04/28 2325)  Physical Exam: General: Laying in bed in no acute distress Cardiovascular: Regular rate and rhythm, no murmur Respiratory: Clear to auscultation bilaterally, no wheezing Abdomen: Soft, non-tender, non-distended Extremities: No tenderness Neuro: Alert and oriented x3  Laboratory:  Recent Labs Lab 11/12/13 1643 11/12/13 1714  WBC 7.6  --   HGB 13.0 14.3  HCT 36.3* 42.0  PLT 161  --     Recent Labs Lab 11/12/13 1714 11/12/13 1724 11/13/13 0600  NA 138 138 137  K 3.9 3.9 3.7  CL 97 94* 97  CO2  --  22 25  BUN 11 11 14   CREATININE 2.90* 2.45* 1.70*  CALCIUM  --  8.9 8.3*  PROT  --  8.5* 7.6  BILITOT  --  1.3* 1.8*  ALKPHOS  --  97 93  ALT  --  56* 49  AST  --  116* 90*  GLUCOSE 97 96 96     Imaging/Diagnostic Tests:  No recent imaging  EKG (4/29): Normal sinus rhythm  Jacquelin Hawkingalph Anacarolina Evelyn, MD 11/13/2013, 5:50 AM PGY-1, [facility] FPTS Intern pager: [phone], text pages welcome

## 2013-11-13 NOTE — Progress Notes (Signed)
FMTS ATTENDING  NOTE Bradley Flinn,MD I  have seen and examined this patient, reviewed their chart. I have discussed this patient with the resident. I agree with the resident's findings, assessment and care plan. In addition as discussed with the resident he will benefit from ECHO and or carotid doppler, if not done inpatient may get it as outpatient with his PCP.

## 2013-11-13 NOTE — H&P (Signed)
Attending Addendum  I examined the patient and discussed the assessment and plan with Dr. Konrad DoloresMerrell. I have reviewed the note and agree.  Briefly, 53 yo M with HTN and regular ETOH intake of roughly 3 drinks per night admitted for syncope. Acute onset yesterday while at work. No aura. No incontinence or seizure activity. Admits that he did not drink his usual 3 powerades yesterday due to being busy. Feels well this AM. Up to restroom w/o problem. Denies cough, chest pain, back pain, SOB, leg swelling.   BP 139/100  Pulse 108  Temp(Src) 99 F (37.2 C) (Oral)  Resp 18  Ht 6' (1.829 m)  Wt 199 lb 3.2 oz (90.357 kg)  BMI 27.01 kg/m2  SpO2 99% General appearance: alert, cooperative and no distress Lungs: clear to auscultation bilaterally Heart: regular rate and rhythm, S1, S2 normal, no murmur, click, rub or gallop Abdomen: soft, non-tender; bowel sounds normal; no masses,  no organomegaly Extremities: extremities normal, atraumatic, no cyanosis or edema  53 yo M with syncope.   1. Syncope: has risk factors for cardiomyopathy. Presentation consistent with vasovagal episode.  Obtain CXR followed by ECHO if cardiomegaly on CXR. UDS and ETOH negative.  Orthostatic VS normal Hgb stable  Well score 0.    2. AKI: In the setting of sycnope and ACEi use. Improving. Hold ACEi Trend Cr  3. Dispo: to home pending patient remains w/o syncope and w/u negative.      Dessa PhiFUNCHES,Exzavier Ruderman, MD FAMILY MEDICINE TEACHING SERVICE

## 2013-11-14 LAB — BASIC METABOLIC PANEL
BUN: 13 mg/dL (ref 6–23)
CALCIUM: 8.3 mg/dL — AB (ref 8.4–10.5)
CO2: 24 mEq/L (ref 19–32)
CREATININE: 1.13 mg/dL (ref 0.50–1.35)
Chloride: 94 mEq/L — ABNORMAL LOW (ref 96–112)
GFR, EST AFRICAN AMERICAN: 85 mL/min — AB (ref >90)
GFR, EST NON AFRICAN AMERICAN: 74 mL/min — AB (ref >90)
Glucose, Bld: 89 mg/dL (ref 70–99)
Potassium: 3.8 mEq/L (ref 3.7–5.3)
Sodium: 134 mEq/L — ABNORMAL LOW (ref 137–147)

## 2013-11-14 MED ORDER — HYDROCHLOROTHIAZIDE 12.5 MG PO TABS: 25.0000 mg | ORAL_TABLET | Freq: Every day | ORAL | Status: DC

## 2013-11-14 MED ORDER — HYDROCHLOROTHIAZIDE 25 MG PO TABS
25.0000 mg | ORAL_TABLET | Freq: Every day | ORAL | Status: DC
  Administered 2013-11-14: 25 mg via ORAL
  Filled 2013-11-14: qty 1

## 2013-11-14 NOTE — Discharge Summary (Signed)
Family Medicine Teaching [facility] Discharge Summary  Patient name: [name] Medical record number: [id number] Date of birth: 02/18/1961 Age: 53 y.o. Gender: male Date of Admission: 11/12/2013  Date of Discharge: 11/14/2013 Admitting Physician: Lora PaulaJosalyn C Funches, MD  Primary Care Provider: Pcp Not In System Consultants: None  Indication for Hospitalization: Syncope  Discharge Diagnoses/Problem List:  1. Syncope 2. Acute kidney injury 3. Hypertension 4. Elevated transaminases  Disposition: Discharge home  Discharge Condition: Stable  Discharge Exam:  General: Laying in bed in no acute distress  Cardiovascular: Regular rate and rhythm, 1/6 systolic murmur heard best at left upper sternal border  Respiratory: Clear to auscultation bilaterally, no wheezing  Abdomen: Soft, non-tender, non-distended  Extremities: No tenderness  Neuro: Alert and oriented x3  Brief Hospital Course:   HPI: [name] is a 53 y.o. male presenting with syncope.  Patient reports going to work today and pushing a heavy cart. Shortly after, as he walked away, he was feeling lightheaded and the lights had a glare when he looked at them. The next thing he knew, he was on the ground. This episode was witnessed by his co-workers. He did not have any rhythmic body movements, no bladder/bowel incontinence, no chest pain, palpitations, shortness of breath, nausea or vomiting. He reports no post-ictal state. He does not remember taking more blood pressure medication than normal.  En route to the hospital, blood pressure was 60/38 per EMS report and received 900ml NS bolus. Received 1L bolus in the ED. Orthostatic vitals obtained and negative. CT head showed mild age-appropriate degenerative changes. Abdominal U/S obtained to assess abdominal aorta and was negative. CT neck was normal.  Syncope: patient given IV fluids in addition to regular diet. echo was normal. Patient did not have any recurrent  episodes. Unknown mechanism but most likely orthostatic vs vasovagal.  Acute kidney injury: patient's creatinine of 2.90 on first arrival much elevated from previous normal of 0.85 in 2012. Patient stated he was never told he had kidney problems by his PCP. Gave IV fluids. Creatinine eventually came down to 1.13 before discharge. Lisinopril discontinued before discharge.  Hypertension: continued patient's HCTZ and discontinued lisinopril. Blood pressures started elevating to 150s systolic and HCTZ increased to 25mg  from 12.5mg . Blood pressures returned to normal range before discharge.  Elevated transaminases: AST and ALT initially elevated, but patient states drinking alcohol the night before. AST and ALT trended and were decreasing before discharge.  Issues for Follow Up:  1. Blood pressure control. Increased to HCTZ 25mg . Lisinopril stopped. Could benefit from amlodipine if still hypertensive on HCTZ 25mg  alone. Would not recommend lisinopril  Significant Procedures:  1. Echocardiogram  Significant Labs and Imaging:   Recent Labs Lab 11/12/13 1643 11/12/13 1714  WBC 7.6  --   HGB 13.0 14.3  HCT 36.3* 42.0  PLT 161  --     Recent Labs Lab 11/12/13 1714 11/12/13 1724 11/13/13 0600 11/14/13 0801  NA 138 138 137 134*  K 3.9 3.9 3.7 3.8  CL 97 94* 97 94*  CO2  --  22 25 24   GLUCOSE 97 96 96 89  BUN 11 11 14 13   CREATININE 2.90* 2.45* 1.70* 1.13  CALCIUM  --  8.9 8.3* 8.3*  ALKPHOS  --  97 93  --   AST  --  116* 90*  --   ALT  --  56* 49  --   ALBUMIN  --  4.2 3.7  --    Echocardiogram (4/29) Study Conclusions  Left ventricle: The cavity size was normal. Systolic function was normal. The estimated ejection fraction was in the range of 55% to 60%. Wall motion was normal; there were no regional wall motion abnormalities.   Results/Tests Pending at Time of Discharge: None  Discharge Medications:    Medication List    STOP taking these medications        lisinopril 40 MG tablet  Commonly known as:  PRINIVIL,ZESTRIL      TAKE these medications       hydrochlorothiazide 12.5 MG tablet  Commonly known as:  HYDRODIURIL  Take 2 tablets (25 mg total) by mouth daily.     omeprazole 20 MG capsule  Commonly known as:  PRILOSEC  Take 20 mg by mouth daily.        Discharge Instructions: Please refer to Patient Instructions section of EMR for full details.  Patient was counseled important signs and symptoms that should prompt return to medical care, changes in medications, dietary instructions, activity restrictions, and follow up appointments.   Follow-Up Appointments: Follow-up Information   [facility] Schedule an appointment as soon as possible for a visit in 1 week. (For hospital follow-up)    Contact information:   [address] [place], [place] [address] Phone: [phone]      Jacquelin Hawkingalph Clarion Mooneyhan, MD 11/14/2013, 10:53 AM PGY-1, [facility]

## 2013-11-14 NOTE — Progress Notes (Signed)
FMTS ATTENDING  NOTE Yomaira Solar,MD I  have seen and examined this patient, reviewed their chart. I have discussed this patient with the resident. I agree with the resident's findings, assessment and care plan. 

## 2013-11-14 NOTE — Progress Notes (Signed)
11/14/13 1209 nsg Pt d/c to home . Discharge instructions understood.

## 2013-11-14 NOTE — Progress Notes (Signed)
Family Medicine Teaching Service Daily Progress Note Intern Pager: [phone]  Patient name: [name] Medical record number: [id number] Date of birth: 10/25/1960 Age: 53 y.o. Gender: male  Primary Care Provider: Pcp Not In System Consultants: None Code Status: Full code  Pt Overview and Major Events to Date:  4/28: patient admitted  Assessment and Plan: [name] is a 53 y.o. male presenting with syncope x1 . PMH is significant for hypertension   # Syncope: unsure of etiology. Could be vasovagal vs orthostatic hypotension vs arrhythmia vs seizure vs ETOH or drug induced weakness. History does not support seizure as patient has no prior history, no post-ictal state, no rhythmic movements or incontinence. Patient has no known history of heart arrythmia and is currently sinus rhythm. Orthostatic vitals in ED were negative. Pt endorses small daily ETOH consumption and w/ transaminitis and mild pancreatitis on labs on admission. UDS and EtOH level normal. Orthostatics negative this morning. EKG normal sinus rhythm. Continuous cardiac monitoring  # Acute kidney injury: last creatinine in 2012 of 0.85. Creatinine on admission: 2.45. Current creatinine 1.13. FENa: 0.2% IV hydration in addition to regular diet Consider outpt renal f/u if remains elevated Lisinopril held as in below problem Daily Cmet  # Hypertension: on lisinopril and HCTZ at home. Pressure well within the normal range on admission and even reported hypotension per EMS of sbp of 60. Currently hypertensive with BPs 150s/100s Discontinue home lisinopril and will not continue on discharge  Increase to HCTZ 25mg   # Elevated transaminases: currently down from 2012 values. Most likely from recent alcohol consumption. Question amount of ETOH consumption. EtOH level normal. Improving.  Disposition: Discharge home today  Subjective:  Patient reports no complaints today. No lightheadedness, chest pain, shortness of breath,  palpitations, nausea/vomiting or dysuria.  Objective: Temp:  [98.4 F (36.9 C)-98.9 F (37.2 C)] 98.7 F (37.1 C) (04/30 0545) Pulse Rate:  [76-95] 90 (04/30 0620) Resp:  [18] 18 (04/30 0545) BP: (124-158)/(76-110) 158/110 mmHg (04/30 0620) SpO2:  [98 %-100 %] 99 % (04/30 0545) Weight:  [203 lb (92.08 kg)] 203 lb (92.08 kg) (04/29 2124)  Physical Exam: General: Laying in bed in no acute distress Cardiovascular: Regular rate and rhythm, 1/6 systolic murmur heard best at left upper sternal border Respiratory: Clear to auscultation bilaterally, no wheezing Abdomen: Soft, non-tender, non-distended Extremities: No tenderness Neuro: Alert and oriented x3  Laboratory:  Recent Labs Lab 11/12/13 1643 11/12/13 1714  WBC 7.6  --   HGB 13.0 14.3  HCT 36.3* 42.0  PLT 161  --     Recent Labs Lab 11/12/13 1724 11/13/13 0600 11/14/13 0801  NA 138 137 134*  K 3.9 3.7 3.8  CL 94* 97 94*  CO2 22 25 24   BUN 11 14 13   CREATININE 2.45* 1.70* 1.13  CALCIUM 8.9 8.3* 8.3*  PROT 8.5* 7.6  --   BILITOT 1.3* 1.8*  --   ALKPHOS 97 93  --   ALT 56* 49  --   AST 116* 90*  --   GLUCOSE 96 96 89   TSH  Date Value Ref Range Status  11/13/2013 1.070  0.350 - 4.500 uIU/mL Final     Please note change in reference range.    Imaging/Diagnostic Tests:  Echo performed, waiting on read.  Jacquelin Hawkingalph Leomia Blake, MD 11/14/2013, 7:45 AM PGY-1, [facility] FPTS Intern pager: [phone], text pages welcome

## 2013-11-14 NOTE — Discharge Instructions (Signed)
We admitted you because you passed out at work and because your kidneys looked like they were working harder than usual. We gave you fluids which helped your kidneys greatly. We also stopped your Lisinopril. Regarding your passing out, we could not find a definitive cause, but dehydration may have led to decreased blood volume which may have made you more susceptible to passing out. We got an ultrasound of your heart, which has not been read yet. We increased your HCTZ to 25mg  daily. Please stop taking the lisinopril. Also, please stay very well hydrated as the HCTZ can cause increase volume loss through urination. Please make an appointment to see your doctor in the next week.

## 2013-11-16 NOTE — Discharge Summary (Signed)
FMTS ATTENDING  NOTE Olyvia Gopal,MD I  have seen and examined this patient, reviewed their chart. I have discussed this patient with the resident. I agree with the resident's findings, assessment and care plan. 

## 2014-06-11 ENCOUNTER — Ambulatory Visit (HOSPITAL_COMMUNITY)
Admission: RE | Admit: 2014-06-11 | Discharge: 2014-06-11 | Disposition: A | Discharge status: Home / Self Care | Payer: BC Managed Care – PPO | Source: Ambulatory Visit | Attending: Family Medicine | Admitting: Family Medicine

## 2014-06-11 ENCOUNTER — Other Ambulatory Visit (HOSPITAL_COMMUNITY): Payer: Self-pay | Admitting: Unknown Physician Specialty

## 2014-06-11 DIAGNOSIS — M25569 Pain in unspecified knee: Secondary | ICD-10-CM

## 2014-06-11 DIAGNOSIS — M7989 Other specified soft tissue disorders: Secondary | ICD-10-CM

## 2014-06-11 NOTE — Progress Notes (Signed)
*  Preliminary Results* Bilateral lower extremity venous duplex completed. Bilateral lower extremities are negative for deep vein thrombosis. There is no evidence of Baker's cyst bilaterally.  06/11/2014  Gertie FeyMichelle Lenora Gomes, RVT, RDCS, RDMS

## 2014-11-13 ENCOUNTER — Telehealth: Payer: Self-pay | Admitting: *Deleted

## 2014-11-13 NOTE — Telephone Encounter (Signed)
Unable to reach patient at time of Pre-Visit Call.  Left message for patient to return call when available.    

## 2014-11-14 ENCOUNTER — Telehealth: Payer: Self-pay | Admitting: Medical

## 2014-11-14 ENCOUNTER — Encounter: Payer: Self-pay | Admitting: Medical

## 2014-11-14 ENCOUNTER — Ambulatory Visit (INDEPENDENT_AMBULATORY_CARE_PROVIDER_SITE_OTHER): Payer: Federal, State, Local not specified - PPO | Admitting: Medical

## 2014-11-14 VITALS — BP 140/90 | HR 92 | Temp 98.5°F | Ht 72.75 in | Wt 200.4 lb

## 2014-11-14 DIAGNOSIS — R55 Syncope and collapse: Secondary | ICD-10-CM

## 2014-11-14 DIAGNOSIS — G4730 Sleep apnea, unspecified: Secondary | ICD-10-CM

## 2014-11-14 DIAGNOSIS — M1990 Unspecified osteoarthritis, unspecified site: Secondary | ICD-10-CM | POA: Diagnosis not present

## 2014-11-14 DIAGNOSIS — J301 Allergic rhinitis due to pollen: Secondary | ICD-10-CM

## 2014-11-14 DIAGNOSIS — S86019A Strain of unspecified Achilles tendon, initial encounter: Secondary | ICD-10-CM | POA: Insufficient documentation

## 2014-11-14 DIAGNOSIS — S86011A Strain of right Achilles tendon, initial encounter: Secondary | ICD-10-CM

## 2014-11-14 DIAGNOSIS — I10 Essential (primary) hypertension: Secondary | ICD-10-CM | POA: Diagnosis not present

## 2014-11-14 DIAGNOSIS — R748 Abnormal levels of other serum enzymes: Secondary | ICD-10-CM

## 2014-11-14 DIAGNOSIS — R600 Localized edema: Secondary | ICD-10-CM

## 2014-11-14 DIAGNOSIS — G629 Polyneuropathy, unspecified: Secondary | ICD-10-CM | POA: Insufficient documentation

## 2014-11-14 DIAGNOSIS — J309 Allergic rhinitis, unspecified: Secondary | ICD-10-CM | POA: Insufficient documentation

## 2014-11-14 DIAGNOSIS — N19 Unspecified kidney failure: Secondary | ICD-10-CM

## 2014-11-14 DIAGNOSIS — D696 Thrombocytopenia, unspecified: Secondary | ICD-10-CM

## 2014-11-14 DIAGNOSIS — H269 Unspecified cataract: Secondary | ICD-10-CM

## 2014-11-14 LAB — CBC WITH DIFFERENTIAL/PLATELET
Basophils Absolute: 0.1 10*3/uL (ref 0.0–0.1)
Basophils Relative: 1.7 % (ref 0.0–3.0)
Eosinophils Absolute: 0.3 10*3/uL (ref 0.0–0.7)
Eosinophils Relative: 5.1 % — ABNORMAL HIGH (ref 0.0–5.0)
HCT: 37.4 % — ABNORMAL LOW (ref 39.0–52.0)
Hemoglobin: 12.9 g/dL — ABNORMAL LOW (ref 13.0–17.0)
Lymphocytes Relative: 39.1 % (ref 12.0–46.0)
Lymphs Abs: 2.3 10*3/uL (ref 0.7–4.0)
MCHC: 34.4 g/dL (ref 30.0–36.0)
MCV: 95.5 fl (ref 78.0–100.0)
Monocytes Absolute: 0.7 10*3/uL (ref 0.1–1.0)
Monocytes Relative: 11.3 % (ref 3.0–12.0)
Neutro Abs: 2.6 10*3/uL (ref 1.4–7.7)
Neutrophils Relative %: 42.8 % — ABNORMAL LOW (ref 43.0–77.0)
Platelets: 75 10*3/uL — ABNORMAL LOW (ref 150.0–400.0)
RBC: 3.92 Mil/uL — ABNORMAL LOW (ref 4.22–5.81)
RDW: 18.8 % — ABNORMAL HIGH (ref 11.5–15.5)
WBC: 6 10*3/uL (ref 4.0–10.5)

## 2014-11-14 LAB — COMPREHENSIVE METABOLIC PANEL WITH GFR
ALT: 52 U/L (ref 0–53)
AST: 191 U/L — ABNORMAL HIGH (ref 0–37)
Albumin: 3.3 g/dL — ABNORMAL LOW (ref 3.5–5.2)
Alkaline Phosphatase: 142 U/L — ABNORMAL HIGH (ref 39–117)
BUN: 3 mg/dL — ABNORMAL LOW (ref 6–23)
CO2: 27 meq/L (ref 19–32)
Calcium: 8.2 mg/dL — ABNORMAL LOW (ref 8.4–10.5)
Chloride: 101 meq/L (ref 96–112)
Creatinine, Ser: 0.65 mg/dL (ref 0.40–1.50)
GFR: 165.75 mL/min
Glucose, Bld: 97 mg/dL (ref 70–99)
Potassium: 3.2 meq/L — ABNORMAL LOW (ref 3.5–5.1)
Sodium: 138 meq/L (ref 135–145)
Total Bilirubin: 3.3 mg/dL — ABNORMAL HIGH (ref 0.2–1.2)
Total Protein: 8.4 g/dL — ABNORMAL HIGH (ref 6.0–8.3)

## 2014-11-14 MED ORDER — POTASSIUM CHLORIDE CRYS ER 20 MEQ PO TBCR: 20.0000 meq | EXTENDED_RELEASE_TABLET | Freq: Once | ORAL | Status: DC

## 2014-11-14 NOTE — Assessment & Plan Note (Signed)
Rt achilles tendon tear hx in August. Pt is in therapy. Initial visit with myself.

## 2014-11-14 NOTE — Assessment & Plan Note (Signed)
History of sleep apnea- maybe. Pt states no hx of machine use. Pt does snore a lot.

## 2014-11-14 NOTE — Assessment & Plan Note (Signed)
Htn- history of htn. Pt is on diuretic. Get cmp today. Check bp daily and see if on average less than 140/90.

## 2014-11-14 NOTE — Assessment & Plan Note (Signed)
Arthritis- lt knee, lt shoulder, both feet, left hip.

## 2014-11-14 NOTE — Assessment & Plan Note (Signed)
Cataracts- In past. States had surgery.

## 2014-11-14 NOTE — Assessment & Plan Note (Signed)
Intermittent not constant. No med for presently. Rt lateral calf since achilles tendon tear in august

## 2014-11-14 NOTE — Progress Notes (Signed)
Subjective:    Patient ID: [name], male    DOB: 08/13/1960, 54 y.o.   MRN: [id number]  HPI   I have reviewed pt PMH, PSH, FH, Social History and Surgical History  [employer], No caffeinated, Pt eating healthy but girlfriend states does not eat much. Has girlfriend. Former smoker. Stopped 15-20 yrs.   Allergies- seasonal allergies.  Arthritis- lt knee, lt shoulder, both feet, left hip.  Cataracts- In past. States had surgery.  Heart murmur- slight murmur. Pt not sure what type.  History of sleep apnea- maybe. Pt states no hx of machine use. Pt does snore a lot.   Htn- history of htn. Pt is on diuretic.   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   Rt achilles tendon tear hx in August.    Some pedal edema complaints. Swelling distal 1/4 tibia area down to feet bilaterally. This has been going on since november. Swelling is present all the time. Pt states was worse until he started wearing compression socks on month ago. Pt states he had tear  Rt achilles tendon. He was very active before the tear Now mostly hangs around house only. Pt is going to PT. No weight gain. No sob. Pt states in am the swelling is no better. Not very active at all.  Pt states cxr about 4- months ago.  Pt also states some dopplers in the past.      Review of Systems  Constitutional: Negative for fever, chills, diaphoresis, activity change and fatigue.  Respiratory: Negative for cough, chest tightness and shortness of breath.   Cardiovascular: Negative for chest pain, palpitations and leg swelling.  Gastrointestinal: Negative for nausea, vomiting and abdominal pain.  Musculoskeletal: Negative for neck pain and neck stiffness.       Lower extremity. Pedal edema bilaterally.   Neurological: Negative for dizziness, tremors, seizures, syncope, facial asymmetry, speech difficulty, weakness, light-headedness, numbness and headaches.  Psychiatric/Behavioral:  Negative for behavioral problems, confusion and agitation. The patient is not nervous/anxious.      Past Medical History  Diagnosis Date  . Hypertension   . Allergy   . Arthritis   . Cataract   . Heart murmur   . Sleep apnea     Problems with sleeping  . Neuromuscular disorder     neuropathy rt lateral calf.    History   Social History  . Marital Status: Single    Spouse Name: N/A  . Number of Children: N/A  . Years of Education: N/A   Occupational History  . Not on file.   Social History Main Topics  . Smoking status: Former Smoker    Types: Cigarettes  . Smokeless tobacco: Never Used  . Alcohol Use: 0.0 oz/week    0 Standard drinks or equivalent per week     Comment: . 4-5 drinks glasses of wine at night.(every night)  . Drug Use: No  . Sexual Activity: Yes   Other Topics Concern  . Not on file   Social History Narrative    Past Surgical History  Procedure Laterality Date  . Foot surgery Right   . Nasal septum surgery    . Vasectomy      Family History  Problem Relation Age of Onset  . Atrial fibrillation Mother   . CVA Mother   . Hypertension Mother   . Emphysema Mother     Allergies  Allergen Reactions  . Lactose Intolerance (Gi)     unknown  Current Outpatient Prescriptions on File Prior to Visit  Medication Sig Dispense Refill  . hydrochlorothiazide (HYDRODIURIL) 12.5 MG tablet Take 2 tablets (25 mg total) by mouth daily. 30 tablet 0  . omeprazole (PRILOSEC) 20 MG capsule Take 20 mg by mouth daily.     No current facility-administered medications on file prior to visit.    BP 140/90 mmHg  Pulse 92  Temp(Src) 98.5 F (36.9 C) (Oral)  Ht 6' 0.75" (1.848 m)  Wt 200 lb 6.4 oz (90.901 kg)  BMI 26.62 kg/m2  SpO2 95%       Objective:   Physical Exam  General Mental Status- Alert. General Appearance- Not in acute distress.   Skin General: Color- Normal Color. Moisture- Normal Moisture.  Neck Carotid Arteries- Normal color.  Moisture- Normal Moisture.   Chest and Lung Exam Auscultation: Breath Sounds:-Normal.  Cardiovascular Auscultation:Rythm- Regular. Murmurs & Other Heart Sounds:Auscultation of the heart reveals- No Murmurs.  Abdomen Inspection:-Inspeection Normal. Palpation/Percussion:Note:No mass. Palpation and Percussion of the abdomen reveal- Non Tender, Non Distended + BS, no rebound or guarding.    Neurologic Cranial Nerve exam:- CN III-XII intact(No nystagmus), symmetric smile. Strength:- 5/5 equal and symmetric strength both upper and lower extremities.  Lower extremity-  Bilateral pedal edema. 1-2+ pedal edema bilaterally. Negative homans signs.     Assessment & Plan:

## 2014-11-14 NOTE — Assessment & Plan Note (Signed)
Described by patient acute in past. Check cmp today.

## 2014-11-14 NOTE — Telephone Encounter (Signed)
Note to referral staff for pt referral on Monday 2nd.

## 2014-11-14 NOTE — Progress Notes (Signed)
Pre visit review using our clinic review tool, if applicable. No additional management support is needed unless otherwise documented below in the visit note. 

## 2014-11-14 NOTE — Assessment & Plan Note (Signed)
Allergies- seasonal allergies.

## 2014-11-14 NOTE — Telephone Encounter (Signed)
I called pt at 6:25 or so on Friday. Advised him on his low platlets, liver enzyme elevation and and low k. Pt has no nose bleeds, no blood stools, no black stools. No excess bruising. No extreme fatigue. I advised pt to stop drinking any alcohol. Earlier today when I asked him amount his girlfriend frowned. I think he underestimated amount. I explained to him sometimes liver issues can effect platlets but also other causes. If any nose bleeds, black stools, excess brusing or fatigue then ED this weekend. Pt expressed understanding. I will call in k-dur to his pharmacy. Put in order for cbc, cmp, and ggt on Sunday to be done stat. Also I am going to try to get urgent/stat referal to hematology Monday or Tuesday. I wanted pt to come in 1st thing on Monday. He states he can't come in to get blood work until afternoon on Monday.

## 2014-11-14 NOTE — Assessment & Plan Note (Signed)
Dependant edema likley. Will get cxr today. Use compression stocking, elevate legs when he can, and be more active as tolerated(with instruction of PT).  If any popliteal pain or worsening edema then be seen for stat doppler UC, ED or here. Note hx of prior dopplers negative. IN past legs more swollen.

## 2014-11-14 NOTE — Assessment & Plan Note (Signed)
Maybe related dehydration and kidney issues which he states were transient.

## 2014-11-14 NOTE — Patient Instructions (Signed)
Allergic rhinitis Allergies- seasonal allergies.   Syncope Maybe related dehydration and kidney issues which he states were transient.   Renal failure Described by patient acute in past. Check cmp today.   Arthritis Arthritis- lt knee, lt shoulder, both feet, left hip.   Cataracts, bilateral Cataracts- In past. States had surgery.   Sleep apnea History of sleep apnea- maybe. Pt states no hx of machine use. Pt does snore a lot.    HTN (hypertension) Htn- history of htn. Pt is on diuretic. Get cmp today. Check bp daily and see if on average less than 140/90.   Neuropathy Intermittent not constant. No med for presently. Rt lateral calf since achilles tendon tear in august   Achilles tendon tear Rt achilles tendon tear hx in August. Pt is in therapy. Initial visit with myself.   Pedal edema Dependant edema likley. Will get cxr today. Use compression stocking, elevate legs when he can, and be more active as tolerated(with instruction of PT).  If any popliteal pain or worsening edema then be seen for stat doppler UC, ED or here. Note hx of prior dopplers negative. IN past legs more swollen.     Follow up in 2 wks or as needed.

## 2014-11-18 ENCOUNTER — Telehealth: Payer: Self-pay | Admitting: Medical

## 2014-11-18 NOTE — Telephone Encounter (Signed)
Referral sent to Dr Myna HidalgoEnnever, they will contact pt directly.

## 2014-11-18 NOTE — Telephone Encounter (Signed)
Called patient no answer. Left message for patient to return call.

## 2014-11-18 NOTE — Telephone Encounter (Signed)
Lpn to call pt and see how he is. Check on repeat labs and referal?

## 2014-11-19 NOTE — Telephone Encounter (Signed)
Question to referal staff.

## 2014-11-19 NOTE — Telephone Encounter (Signed)
Dr Myna Hidalgoennever reviewed the notes labs and referral and said the patient needed to be seen within 2 weeks.  Raiford NobleRick will schedule for that time frame

## 2014-11-19 NOTE — Telephone Encounter (Signed)
Ok thanks for update

## 2014-11-19 NOTE — Telephone Encounter (Signed)
Called patient. Left message for callback regarding labs not collected. Asked patient how he is feeling.

## 2014-11-19 NOTE — Telephone Encounter (Signed)
Ask my nurse to call pt and see why he has not drawn the labs I wanted repeated on Monday. See how he is?

## 2014-11-20 NOTE — Telephone Encounter (Signed)
Dr Myna Hidalgoennever has reviewed the records and the patient will be seen within 2 week

## 2014-11-26 NOTE — Telephone Encounter (Signed)
Called patient back regarding labwork repeat. Left message for him to call office.

## 2014-11-28 ENCOUNTER — Encounter: Payer: Self-pay | Admitting: Medical

## 2014-11-28 ENCOUNTER — Ambulatory Visit (INDEPENDENT_AMBULATORY_CARE_PROVIDER_SITE_OTHER): Payer: Federal, State, Local not specified - PPO | Admitting: Medical

## 2014-11-28 ENCOUNTER — Ambulatory Visit: Payer: Federal, State, Local not specified - PPO | Admitting: Medical

## 2014-11-28 VITALS — BP 140/93 | HR 95 | Temp 98.4°F | Ht 72.75 in | Wt 196.8 lb

## 2014-11-28 DIAGNOSIS — R748 Abnormal levels of other serum enzymes: Secondary | ICD-10-CM

## 2014-11-28 DIAGNOSIS — I10 Essential (primary) hypertension: Secondary | ICD-10-CM

## 2014-11-28 DIAGNOSIS — D696 Thrombocytopenia, unspecified: Secondary | ICD-10-CM

## 2014-11-28 LAB — COMPREHENSIVE METABOLIC PANEL
ALK PHOS: 188 U/L — AB (ref 39–117)
ALT: 45 U/L (ref 0–53)
AST: 144 U/L — AB (ref 0–37)
Albumin: 3.4 g/dL — ABNORMAL LOW (ref 3.5–5.2)
BUN: 3 mg/dL — ABNORMAL LOW (ref 6–23)
CALCIUM: 8.7 mg/dL (ref 8.4–10.5)
CO2: 28 mEq/L (ref 19–32)
Chloride: 101 mEq/L (ref 96–112)
Creatinine, Ser: 0.72 mg/dL (ref 0.40–1.50)
GFR: 147.27 mL/min (ref >60.00)
Glucose, Bld: 99 mg/dL (ref 70–99)
POTASSIUM: 3.3 meq/L — AB (ref 3.5–5.1)
Sodium: 136 mEq/L (ref 135–145)
Total Bilirubin: 2.8 mg/dL — ABNORMAL HIGH (ref 0.2–1.2)
Total Protein: 8.9 g/dL — ABNORMAL HIGH (ref 6.0–8.3)

## 2014-11-28 LAB — CBC WITH DIFFERENTIAL/PLATELET
BASOS PCT: 2 % (ref 0.0–3.0)
Basophils Absolute: 0.1 10*3/uL (ref 0.0–0.1)
EOS ABS: 0.2 10*3/uL (ref 0.0–0.7)
Eosinophils Relative: 4.5 % (ref 0.0–5.0)
HEMATOCRIT: 37.1 % — AB (ref 39.0–52.0)
HEMOGLOBIN: 12.8 g/dL — AB (ref 13.0–17.0)
Lymphocytes Relative: 36.4 % (ref 12.0–46.0)
Lymphs Abs: 2 10*3/uL (ref 0.7–4.0)
MCHC: 34.5 g/dL (ref 30.0–36.0)
MCV: 96.4 fl (ref 78.0–100.0)
Monocytes Absolute: 0.6 10*3/uL (ref 0.1–1.0)
Monocytes Relative: 10.1 % (ref 3.0–12.0)
NEUTROS ABS: 2.6 10*3/uL (ref 1.4–7.7)
Neutrophils Relative %: 47 % (ref 43.0–77.0)
Platelets: 128 10*3/uL — ABNORMAL LOW (ref 150.0–400.0)
RBC: 3.85 Mil/uL — AB (ref 4.22–5.81)
RDW: 18.5 % — ABNORMAL HIGH (ref 11.5–15.5)
WBC: 5.5 10*3/uL (ref 4.0–10.5)

## 2014-11-28 LAB — GAMMA GT: GGT: 635 U/L — ABNORMAL HIGH (ref 7–51)

## 2014-11-28 MED ORDER — POTASSIUM CHLORIDE CRYS ER 20 MEQ PO TBCR: 20.0000 meq | EXTENDED_RELEASE_TABLET | Freq: Once | ORAL | Status: DC

## 2014-11-28 MED ORDER — HYDROCHLOROTHIAZIDE 12.5 MG PO TABS: 25.0000 mg | ORAL_TABLET | Freq: Every day | ORAL | Status: DC

## 2014-11-28 MED ORDER — LISINOPRIL 10 MG PO TABS: 10.0000 mg | ORAL_TABLET | Freq: Every day | ORAL | Status: DC

## 2014-11-28 NOTE — Assessment & Plan Note (Signed)
Borderline. I asked pt to check his bp daily and document. Then in 2 wks see if we need to increase lisinopril to 20 mg a day.

## 2014-11-28 NOTE — Progress Notes (Signed)
Subjective:    Patient ID: [name], male    DOB: 05/20/1961, 54 y.o.   MRN: [id number]  HPI   Pt in for follow up. See last note. Pt had some elevated platelets were low. Liver enzymes were elevated. Pt never came in to get repeat labs as I had asked him to do. I tried to get pt in with hematology. But pt not aware of his appointment. Pt has no nose bleeds,no bruising, no gingiva bleeds and no bloody stools.  Pt reports no abdomen pain. Pt lower ext swelling present since November and December.  Pt was unaware of referral. But hematologist left 5 message with brother and friend.   Borderline heart enlargement on xray.  I did advise pt to cut back on alcohol. Now drinking 3-4 glasses of wine every other day.  Review of Systems  Constitutional: Negative for fever, chills and fatigue.  HENT: Negative for congestion, ear discharge and ear pain.   Respiratory: Negative for cough, chest tightness, shortness of breath and wheezing.   Cardiovascular: Negative for chest pain and palpitations.  Gastrointestinal: Negative for abdominal pain.  Genitourinary: Negative.   Musculoskeletal: Negative for back pain.  Neurological: Negative for dizziness, tremors, speech difficulty, weakness, light-headedness, numbness and headaches.  Hematological: Negative for adenopathy. Does not bruise/bleed easily.    Past Medical History  Diagnosis Date  . Hypertension   . Allergy   . Arthritis   . Cataract   . Heart murmur   . Sleep apnea     Problems with sleeping  . Neuromuscular disorder     neuropathy rt lateral calf.    History   Social History  . Marital Status: Single    Spouse Name: N/A  . Number of Children: N/A  . Years of Education: N/A   Occupational History  . Not on file.   Social History Main Topics  . Smoking status: Former Smoker    Types: Cigarettes  . Smokeless tobacco: Never Used  . Alcohol Use: 0.0 oz/week    0 Standard drinks or equivalent per week   Comment: . 4-5 drinks glasses of wine at night.(every night)  . Drug Use: No  . Sexual Activity: Yes   Other Topics Concern  . Not on file   Social History Narrative    Past Surgical History  Procedure Laterality Date  . Foot surgery Right   . Nasal septum surgery    . Vasectomy      Family History  Problem Relation Age of Onset  . Atrial fibrillation Mother   . CVA Mother   . Hypertension Mother   . Emphysema Mother     Allergies  Allergen Reactions  . Lactose Intolerance (Gi)     unknown    Current Outpatient Prescriptions on File Prior to Visit  Medication Sig Dispense Refill  . hydrochlorothiazide (HYDRODIURIL) 12.5 MG tablet Take 2 tablets (25 mg total) by mouth daily. 30 tablet 0  . lisinopril (PRINIVIL,ZESTRIL) 10 MG tablet Take 10 mg by mouth daily.    . meloxicam (MOBIC) 15 MG tablet Take 15 mg by mouth daily.    Marland Kitchen. omeprazole (PRILOSEC) 20 MG capsule Take 20 mg by mouth daily.    . potassium chloride SA (K-DUR,KLOR-CON) 20 MEQ tablet Take 1 tablet (20 mEq total) by mouth once. 14 tablet 0   No current facility-administered medications on file prior to visit.    BP 143/96 mmHg  Pulse 95  Temp(Src) 98.4 F (36.9 C) (Oral)  Ht 6' 0.75" (1.848 m)  Wt 196 lb 12.8 oz (89.268 kg)  BMI 26.14 kg/m2  SpO2 98%       Objective:   Physical Exam  Physical Exam  General Mental Status- Alert. General Appearance- Not in acute distress.   Skin General: Color- Normal Color. Moisture- Normal Moisture. No jaundiced color.  Neck Carotid Arteries- Normal color. Moisture- Normal Moisture.   Chest and Lung Exam Auscultation: Breath Sounds:-Normal. CTA  Cardiovascular Auscultation:Rythm- Regular. Murmurs & Other Heart Sounds:Auscultation of the heart reveals- No Murmurs.  Abdomen Inspection:-Inspeection Normal. Palpation/Percussion:Note:No mass. Palpation and Percussion of the abdomen reveal- Non Tender, Non Distended + BS, no rebound or  guarding.    Neurologic Cranial Nerve exam:- CN III-XII intact(No nystagmus), symmetric smile. Strength:- 5/5 equal and symmetric strength both upper and lower extremities.  Lower extremity- Bilateral pedal edema. 1-2+ pedal edema bilaterally. Negative homans signs.      Assessment & Plan:  In future follow xrays to see if gets chf. See last cxr.

## 2014-11-28 NOTE — Progress Notes (Signed)
Pre visit review using our clinic review tool, if applicable. No additional management support is needed unless otherwise documented below in the visit note. 

## 2014-11-28 NOTE — Telephone Encounter (Signed)
Patient in office today for follow up.   

## 2014-11-28 NOTE — Assessment & Plan Note (Signed)
Advised get labs which I had asked him to do 2 wks ago. Instructed/showed him where lab is. Asked lab to do test stat. Advised pt to watch for fatigue, nose bleeds, bruising, bleeding gums or bloody stools. If such were to occur then ED evaluation.  Stop drinking any alcohol.

## 2014-11-28 NOTE — Patient Instructions (Addendum)
Try to eliminate alcohol completely. Get stat labs today.  We are going to make referal to hematology again for next week hopefully.  I am refilling your bp meds and potassium.  If we can't reach you to give appointment date then call Dr Myna HidalgoEnnever office at 3 pm for your appointnment date.   Follow up in 3 wks or as needed.  Please check you bp daily and document readings.

## 2014-12-16 ENCOUNTER — Ambulatory Visit: Payer: Federal, State, Local not specified - PPO | Admitting: Medical

## 2014-12-16 ENCOUNTER — Telehealth: Payer: Self-pay | Admitting: Medical

## 2014-12-16 DIAGNOSIS — Z0289 Encounter for other administrative examinations: Secondary | ICD-10-CM

## 2014-12-16 NOTE — Telephone Encounter (Signed)
Note to lpn on rescheduling pt.

## 2015-10-31 ENCOUNTER — Encounter (HOSPITAL_COMMUNITY): Payer: Self-pay | Admitting: Emergency Medicine

## 2015-10-31 ENCOUNTER — Inpatient Hospital Stay (HOSPITAL_COMMUNITY): Payer: Federal, State, Local not specified - PPO

## 2015-10-31 ENCOUNTER — Emergency Department (HOSPITAL_COMMUNITY): Payer: Federal, State, Local not specified - PPO

## 2015-10-31 ENCOUNTER — Inpatient Hospital Stay (HOSPITAL_COMMUNITY)
Admission: EM | Admit: 2015-10-31 | Discharge: 2015-11-13 | DRG: 871 | Disposition: A | Discharge status: Skilled Nursing Facility | Payer: Federal, State, Local not specified - PPO | Attending: Family Medicine | Admitting: Family Medicine

## 2015-10-31 DIAGNOSIS — R579 Shock, unspecified: Secondary | ICD-10-CM | POA: Diagnosis not present

## 2015-10-31 DIAGNOSIS — A419 Sepsis, unspecified organism: Secondary | ICD-10-CM | POA: Diagnosis present

## 2015-10-31 DIAGNOSIS — K7011 Alcoholic hepatitis with ascites: Secondary | ICD-10-CM | POA: Diagnosis present

## 2015-10-31 DIAGNOSIS — K559 Vascular disorder of intestine, unspecified: Secondary | ICD-10-CM | POA: Diagnosis present

## 2015-10-31 DIAGNOSIS — K7200 Acute and subacute hepatic failure without coma: Secondary | ICD-10-CM | POA: Insufficient documentation

## 2015-10-31 DIAGNOSIS — Z87891 Personal history of nicotine dependence: Secondary | ICD-10-CM

## 2015-10-31 DIAGNOSIS — N19 Unspecified kidney failure: Secondary | ICD-10-CM | POA: Diagnosis present

## 2015-10-31 DIAGNOSIS — B1920 Unspecified viral hepatitis C without hepatic coma: Secondary | ICD-10-CM | POA: Diagnosis present

## 2015-10-31 DIAGNOSIS — R1084 Generalized abdominal pain: Secondary | ICD-10-CM | POA: Diagnosis not present

## 2015-10-31 DIAGNOSIS — J811 Chronic pulmonary edema: Secondary | ICD-10-CM

## 2015-10-31 DIAGNOSIS — G5792 Unspecified mononeuropathy of left lower limb: Secondary | ICD-10-CM | POA: Diagnosis present

## 2015-10-31 DIAGNOSIS — K7031 Alcoholic cirrhosis of liver with ascites: Secondary | ICD-10-CM

## 2015-10-31 DIAGNOSIS — J189 Pneumonia, unspecified organism: Secondary | ICD-10-CM | POA: Diagnosis present

## 2015-10-31 DIAGNOSIS — E162 Hypoglycemia, unspecified: Secondary | ICD-10-CM | POA: Diagnosis not present

## 2015-10-31 DIAGNOSIS — E872 Acidosis, unspecified: Secondary | ICD-10-CM

## 2015-10-31 DIAGNOSIS — R0600 Dyspnea, unspecified: Secondary | ICD-10-CM

## 2015-10-31 DIAGNOSIS — Z7189 Other specified counseling: Secondary | ICD-10-CM | POA: Diagnosis not present

## 2015-10-31 DIAGNOSIS — R17 Unspecified jaundice: Secondary | ICD-10-CM | POA: Diagnosis not present

## 2015-10-31 DIAGNOSIS — R571 Hypovolemic shock: Secondary | ICD-10-CM | POA: Diagnosis present

## 2015-10-31 DIAGNOSIS — N179 Acute kidney failure, unspecified: Secondary | ICD-10-CM

## 2015-10-31 DIAGNOSIS — R402222 Coma scale, best verbal response, incomprehensible words, at arrival to emergency department: Secondary | ICD-10-CM | POA: Diagnosis present

## 2015-10-31 DIAGNOSIS — I471 Supraventricular tachycardia, unspecified: Secondary | ICD-10-CM

## 2015-10-31 DIAGNOSIS — R509 Fever, unspecified: Secondary | ICD-10-CM

## 2015-10-31 DIAGNOSIS — G9341 Metabolic encephalopathy: Secondary | ICD-10-CM | POA: Diagnosis present

## 2015-10-31 DIAGNOSIS — R402122 Coma scale, eyes open, to pain, at arrival to emergency department: Secondary | ICD-10-CM | POA: Diagnosis present

## 2015-10-31 DIAGNOSIS — F4321 Adjustment disorder with depressed mood: Secondary | ICD-10-CM | POA: Diagnosis not present

## 2015-10-31 DIAGNOSIS — D500 Iron deficiency anemia secondary to blood loss (chronic): Secondary | ICD-10-CM | POA: Diagnosis present

## 2015-10-31 DIAGNOSIS — R600 Localized edema: Secondary | ICD-10-CM

## 2015-10-31 DIAGNOSIS — K7460 Unspecified cirrhosis of liver: Secondary | ICD-10-CM | POA: Diagnosis not present

## 2015-10-31 DIAGNOSIS — L8990 Pressure ulcer of unspecified site, unspecified stage: Secondary | ICD-10-CM | POA: Insufficient documentation

## 2015-10-31 DIAGNOSIS — K7469 Other cirrhosis of liver: Secondary | ICD-10-CM | POA: Diagnosis not present

## 2015-10-31 DIAGNOSIS — D649 Anemia, unspecified: Secondary | ICD-10-CM | POA: Diagnosis present

## 2015-10-31 DIAGNOSIS — I4891 Unspecified atrial fibrillation: Secondary | ICD-10-CM | POA: Diagnosis present

## 2015-10-31 DIAGNOSIS — E43 Unspecified severe protein-calorie malnutrition: Secondary | ICD-10-CM | POA: Diagnosis present

## 2015-10-31 DIAGNOSIS — S7012XD Contusion of left thigh, subsequent encounter: Secondary | ICD-10-CM | POA: Diagnosis not present

## 2015-10-31 DIAGNOSIS — K7682 Hepatic encephalopathy: Secondary | ICD-10-CM | POA: Insufficient documentation

## 2015-10-31 DIAGNOSIS — R402341 Coma scale, best motor response, flexion withdrawal, in the field [EMT or ambulance]: Secondary | ICD-10-CM | POA: Diagnosis present

## 2015-10-31 DIAGNOSIS — D689 Coagulation defect, unspecified: Secondary | ICD-10-CM | POA: Diagnosis not present

## 2015-10-31 DIAGNOSIS — R58 Hemorrhage, not elsewhere classified: Secondary | ICD-10-CM

## 2015-10-31 DIAGNOSIS — D62 Acute posthemorrhagic anemia: Secondary | ICD-10-CM | POA: Diagnosis not present

## 2015-10-31 DIAGNOSIS — K7290 Hepatic failure, unspecified without coma: Secondary | ICD-10-CM | POA: Insufficient documentation

## 2015-10-31 DIAGNOSIS — R188 Other ascites: Secondary | ICD-10-CM | POA: Diagnosis not present

## 2015-10-31 DIAGNOSIS — Z66 Do not resuscitate: Secondary | ICD-10-CM | POA: Diagnosis not present

## 2015-10-31 DIAGNOSIS — R791 Abnormal coagulation profile: Secondary | ICD-10-CM

## 2015-10-31 DIAGNOSIS — J9600 Acute respiratory failure, unspecified whether with hypoxia or hypercapnia: Secondary | ICD-10-CM

## 2015-10-31 DIAGNOSIS — R4182 Altered mental status, unspecified: Secondary | ICD-10-CM

## 2015-10-31 DIAGNOSIS — R5381 Other malaise: Secondary | ICD-10-CM | POA: Diagnosis not present

## 2015-10-31 DIAGNOSIS — Z79899 Other long term (current) drug therapy: Secondary | ICD-10-CM

## 2015-10-31 DIAGNOSIS — J9602 Acute respiratory failure with hypercapnia: Secondary | ICD-10-CM | POA: Diagnosis not present

## 2015-10-31 DIAGNOSIS — R609 Edema, unspecified: Secondary | ICD-10-CM

## 2015-10-31 DIAGNOSIS — I10 Essential (primary) hypertension: Secondary | ICD-10-CM | POA: Diagnosis present

## 2015-10-31 DIAGNOSIS — Z789 Other specified health status: Secondary | ICD-10-CM

## 2015-10-31 DIAGNOSIS — K766 Portal hypertension: Secondary | ICD-10-CM | POA: Diagnosis not present

## 2015-10-31 DIAGNOSIS — K7030 Alcoholic cirrhosis of liver without ascites: Secondary | ICD-10-CM | POA: Diagnosis not present

## 2015-10-31 DIAGNOSIS — D696 Thrombocytopenia, unspecified: Secondary | ICD-10-CM | POA: Diagnosis present

## 2015-10-31 DIAGNOSIS — K7040 Alcoholic hepatic failure without coma: Secondary | ICD-10-CM | POA: Diagnosis present

## 2015-10-31 DIAGNOSIS — Z95828 Presence of other vascular implants and grafts: Secondary | ICD-10-CM

## 2015-10-31 DIAGNOSIS — R601 Generalized edema: Secondary | ICD-10-CM | POA: Diagnosis not present

## 2015-10-31 DIAGNOSIS — R34 Anuria and oliguria: Secondary | ICD-10-CM | POA: Diagnosis present

## 2015-10-31 DIAGNOSIS — J9690 Respiratory failure, unspecified, unspecified whether with hypoxia or hypercapnia: Secondary | ICD-10-CM

## 2015-10-31 DIAGNOSIS — J9601 Acute respiratory failure with hypoxia: Secondary | ICD-10-CM | POA: Diagnosis present

## 2015-10-31 DIAGNOSIS — Z8249 Family history of ischemic heart disease and other diseases of the circulatory system: Secondary | ICD-10-CM | POA: Diagnosis not present

## 2015-10-31 DIAGNOSIS — Z515 Encounter for palliative care: Secondary | ICD-10-CM | POA: Insufficient documentation

## 2015-10-31 DIAGNOSIS — Z91018 Allergy to other foods: Secondary | ICD-10-CM

## 2015-10-31 DIAGNOSIS — S7012XA Contusion of left thigh, initial encounter: Secondary | ICD-10-CM | POA: Diagnosis present

## 2015-10-31 DIAGNOSIS — E876 Hypokalemia: Secondary | ICD-10-CM | POA: Diagnosis present

## 2015-10-31 HISTORY — DX: Anemia, unspecified: D64.9

## 2015-10-31 LAB — COMPREHENSIVE METABOLIC PANEL
ALBUMIN: 1.6 g/dL — AB (ref 3.5–5.0)
ALBUMIN: 1.8 g/dL — AB (ref 3.5–5.0)
ALK PHOS: 70 U/L (ref 38–126)
ALT: 41 U/L (ref 17–63)
ALT: 72 U/L — AB (ref 17–63)
ANION GAP: 18 — AB (ref 5–15)
AST: 170 U/L — AB (ref 15–41)
AST: 364 U/L — ABNORMAL HIGH (ref 15–41)
Alkaline Phosphatase: 74 U/L (ref 38–126)
Anion gap: 23 — ABNORMAL HIGH (ref 5–15)
BILIRUBIN TOTAL: 4.4 mg/dL — AB (ref 0.3–1.2)
BUN: 36 mg/dL — AB (ref 6–20)
BUN: 36 mg/dL — ABNORMAL HIGH (ref 6–20)
CALCIUM: 7.3 mg/dL — AB (ref 8.9–10.3)
CALCIUM: 7.1 mg/dL — AB (ref 8.9–10.3)
CHLORIDE: 107 mmol/L (ref 101–111)
CO2: 7 mmol/L — AB (ref 22–32)
CO2: 11 mmol/L — ABNORMAL LOW (ref 22–32)
CREATININE: 2.51 mg/dL — AB (ref 0.61–1.24)
CREATININE: 2.41 mg/dL — AB (ref 0.61–1.24)
Chloride: 109 mmol/L (ref 101–111)
GFR calc Af Amer: 32 mL/min — ABNORMAL LOW (ref >60)
GFR calc non Af Amer: 28 mL/min — ABNORMAL LOW (ref >60)
GFR calc non Af Amer: 29 mL/min — ABNORMAL LOW (ref >60)
GFR, EST AFRICAN AMERICAN: 34 mL/min — AB (ref >60)
GLUCOSE: 189 mg/dL — AB (ref 65–99)
Glucose, Bld: 148 mg/dL — ABNORMAL HIGH (ref 65–99)
Potassium: 4.2 mmol/L (ref 3.5–5.1)
Potassium: 3.9 mmol/L (ref 3.5–5.1)
SODIUM: 139 mmol/L (ref 135–145)
SODIUM: 136 mmol/L (ref 135–145)
TOTAL PROTEIN: 7.3 g/dL (ref 6.5–8.1)
Total Bilirubin: 5 mg/dL — ABNORMAL HIGH (ref 0.3–1.2)
Total Protein: 6.3 g/dL — ABNORMAL LOW (ref 6.5–8.1)

## 2015-10-31 LAB — URINALYSIS, ROUTINE W REFLEX MICROSCOPIC
Glucose, UA: NEGATIVE mg/dL
Ketones, ur: NEGATIVE mg/dL
LEUKOCYTES UA: NEGATIVE
NITRITE: NEGATIVE
PH: 5 (ref 5.0–8.0)
Protein, ur: NEGATIVE mg/dL
Specific Gravity, Urine: 1.017 (ref 1.005–1.030)

## 2015-10-31 LAB — I-STAT CG4 LACTIC ACID, ED: LACTIC ACID, VENOUS: 14.49 mmol/L — AB (ref 0.5–2.0)

## 2015-10-31 LAB — URINE MICROSCOPIC-ADD ON

## 2015-10-31 LAB — ETHANOL: Alcohol, Ethyl (B): <5 mg/dL (ref <5)

## 2015-10-31 LAB — CBC WITH DIFFERENTIAL/PLATELET
BASOS ABS: 0 10*3/uL (ref 0.0–0.1)
Basophils Relative: 0 %
EOS ABS: 0 10*3/uL (ref 0.0–0.7)
Eosinophils Relative: 0 %
HCT: 8.9 % — ABNORMAL LOW (ref 39.0–52.0)
HEMOGLOBIN: 2.7 g/dL — AB (ref 13.0–17.0)
LYMPHS PCT: 7 %
Lymphs Abs: 0.5 10*3/uL — ABNORMAL LOW (ref 0.7–4.0)
MCH: 25.7 pg — ABNORMAL LOW (ref 26.0–34.0)
MCHC: 30.3 g/dL (ref 30.0–36.0)
MCV: 84.8 fL (ref 78.0–100.0)
Monocytes Absolute: 0.6 10*3/uL (ref 0.1–1.0)
Monocytes Relative: 9 %
NEUTROS ABS: 6.1 10*3/uL (ref 1.7–7.7)
NEUTROS PCT: 84 %
Platelets: 47 10*3/uL — ABNORMAL LOW (ref 150–400)
RBC: 1.05 MIL/uL — AB (ref 4.22–5.81)
RDW: 26.7 % — ABNORMAL HIGH (ref 11.5–15.5)
WBC: 7.2 10*3/uL (ref 4.0–10.5)

## 2015-10-31 LAB — I-STAT ARTERIAL BLOOD GAS, ED
ACID-BASE DEFICIT: 18 mmol/L — AB (ref 0.0–2.0)
Acid-base deficit: 12 mmol/L — ABNORMAL HIGH (ref 0.0–2.0)
BICARBONATE: 9.6 meq/L — AB (ref 20.0–24.0)
Bicarbonate: 13.2 mEq/L — ABNORMAL LOW (ref 20.0–24.0)
O2 Saturation: 99 %
O2 Saturation: 100 %
PCO2 ART: 28 mmHg — AB (ref 35.0–45.0)
PO2 ART: 184 mmHg — AB (ref 80.0–100.0)
PO2 ART: 216 mmHg — AB (ref 80.0–100.0)
Patient temperature: 98.6
Patient temperature: 98.6
TCO2: 11 mmol/L (ref 0–100)
TCO2: 14 mmol/L (ref 0–100)
pCO2 arterial: 30.1 mmHg — ABNORMAL LOW (ref 35.0–45.0)
pH, Arterial: 7.112 — CL (ref 7.350–7.450)
pH, Arterial: 7.283 — ABNORMAL LOW (ref 7.350–7.450)

## 2015-10-31 LAB — PROCALCITONIN: PROCALCITONIN: 0.58 ng/mL

## 2015-10-31 LAB — CBG MONITORING, ED
GLUCOSE-CAPILLARY: 64 mg/dL — AB (ref 65–99)
Glucose-Capillary: 108 mg/dL — ABNORMAL HIGH (ref 65–99)

## 2015-10-31 LAB — MRSA PCR SCREENING: MRSA BY PCR: NEGATIVE

## 2015-10-31 LAB — APTT: aPTT: 47 seconds — ABNORMAL HIGH (ref 24–37)

## 2015-10-31 LAB — LACTATE DEHYDROGENASE: LDH: 597 U/L — AB (ref 98–192)

## 2015-10-31 LAB — TECHNOLOGIST SMEAR REVIEW

## 2015-10-31 LAB — CBC
HEMATOCRIT: 16.6 % — AB (ref 39.0–52.0)
HEMOGLOBIN: 5.1 g/dL — AB (ref 13.0–17.0)
MCH: 25.9 pg — AB (ref 26.0–34.0)
MCHC: 30.7 g/dL (ref 30.0–36.0)
MCV: 84.3 fL (ref 78.0–100.0)
Platelets: 37 10*3/uL — ABNORMAL LOW (ref 150–400)
RBC: 1.97 MIL/uL — AB (ref 4.22–5.81)
RDW: 20.9 % — ABNORMAL HIGH (ref 11.5–15.5)
WBC: 7.9 10*3/uL (ref 4.0–10.5)

## 2015-10-31 LAB — PROTIME-INR
INR: 4.35 — AB (ref 0.00–1.49)
PROTHROMBIN TIME: 40.5 s — AB (ref 11.6–15.2)

## 2015-10-31 LAB — ACETAMINOPHEN LEVEL

## 2015-10-31 LAB — LACTIC ACID, PLASMA: LACTIC ACID, VENOUS: 7.6 mmol/L — AB (ref 0.5–2.0)

## 2015-10-31 LAB — PREPARE RBC (CROSSMATCH)

## 2015-10-31 LAB — ABO/RH: ABO/RH(D): O POS

## 2015-10-31 LAB — I-STAT TROPONIN, ED: TROPONIN I, POC: 0.06 ng/mL (ref 0.00–0.08)

## 2015-10-31 LAB — LIPASE, BLOOD: LIPASE: 270 U/L — AB (ref 11–51)

## 2015-10-31 LAB — BRAIN NATRIURETIC PEPTIDE: B NATRIURETIC PEPTIDE 5: 694.4 pg/mL — AB (ref 0.0–100.0)

## 2015-10-31 LAB — POC OCCULT BLOOD, ED: FECAL OCCULT BLD: POSITIVE — AB

## 2015-10-31 LAB — TROPONIN I: TROPONIN I: 0.04 ng/mL — AB (ref <0.031)

## 2015-10-31 MED ORDER — SODIUM CHLORIDE 0.9 % IV SOLN
INTRAVENOUS | Status: DC | PRN
  Administered 2015-10-31: 1000 mL via INTRAVENOUS

## 2015-10-31 MED ORDER — PHENYLEPHRINE HCL 10 MG/ML IJ SOLN
INTRAMUSCULAR | Status: DC | PRN
  Administered 2015-10-31: 80 ug
  Administered 2015-10-31: 120 ug

## 2015-10-31 MED ORDER — ROCURONIUM BROMIDE 50 MG/5ML IV SOLN
INTRAVENOUS | Status: DC | PRN
  Administered 2015-10-31: 100 mg via INTRAVENOUS

## 2015-10-31 MED ORDER — SODIUM CHLORIDE 0.9% FLUSH
3.0000 mL | INTRAVENOUS | Status: DC | PRN
  Administered 2015-11-01: 10 mL via INTRAVENOUS
  Filled 2015-10-31: qty 3

## 2015-10-31 MED ORDER — DEXTROSE 50 % IV SOLN
INTRAVENOUS | Status: DC | PRN
  Administered 2015-10-31: 1 via INTRAVENOUS

## 2015-10-31 MED ORDER — FENTANYL CITRATE (PF) 100 MCG/2ML IJ SOLN
100.0000 ug | INTRAMUSCULAR | Status: DC | PRN
  Administered 2015-10-31 – 2015-11-02 (×4): 100 ug via INTRAVENOUS
  Filled 2015-10-31 (×4): qty 2

## 2015-10-31 MED ORDER — FENTANYL CITRATE (PF) 100 MCG/2ML IJ SOLN
100.0000 ug | INTRAMUSCULAR | Status: AC | PRN
  Administered 2015-10-31 – 2015-11-01 (×3): 100 ug via INTRAVENOUS
  Filled 2015-10-31 (×3): qty 2

## 2015-10-31 MED ORDER — VANCOMYCIN HCL 10 G IV SOLR
1750.0000 mg | INTRAVENOUS | Status: AC
  Administered 2015-10-31: 1750 mg via INTRAVENOUS
  Filled 2015-10-31: qty 1750

## 2015-10-31 MED ORDER — FENTANYL CITRATE (PF) 100 MCG/2ML IJ SOLN: 50.0000 ug | INTRAMUSCULAR | Status: DC | PRN

## 2015-10-31 MED ORDER — MIDAZOLAM HCL 2 MG/2ML IJ SOLN
2.0000 mg | INTRAMUSCULAR | Status: AC | PRN
  Administered 2015-11-01 (×3): 2 mg via INTRAVENOUS
  Filled 2015-10-31 (×2): qty 2

## 2015-10-31 MED ORDER — MIDAZOLAM HCL 2 MG/2ML IJ SOLN
2.0000 mg | INTRAMUSCULAR | Status: DC | PRN
  Administered 2015-11-01 (×2): 2 mg via INTRAVENOUS
  Filled 2015-10-31 (×4): qty 2

## 2015-10-31 MED ORDER — DEXTROSE 50 % IV SOLN
INTRAVENOUS | Status: AC
  Filled 2015-10-31: qty 50

## 2015-10-31 MED ORDER — SODIUM CHLORIDE 0.9 % IV BOLUS (SEPSIS)
1000.0000 mL | Freq: Once | INTRAVENOUS | Status: AC
  Administered 2015-10-31: 1000 mL via INTRAVENOUS

## 2015-10-31 MED ORDER — SODIUM CHLORIDE 0.9 % IV SOLN
Freq: Once | INTRAVENOUS | Status: AC
  Administered 2015-10-31: 21:00:00 via INTRAVENOUS

## 2015-10-31 MED ORDER — KETAMINE HCL-SODIUM CHLORIDE 100-0.9 MG/10ML-% IV SOSY
80.0000 mg | PREFILLED_SYRINGE | Freq: Once | INTRAVENOUS | Status: AC
  Administered 2015-10-31: 80 mg via INTRAVENOUS
  Filled 2015-10-31: qty 10

## 2015-10-31 MED ORDER — NOREPINEPHRINE BITARTRATE 1 MG/ML IV SOLN
0.0000 ug/min | Freq: Once | INTRAVENOUS | Status: AC
  Administered 2015-10-31: 10 ug/min via INTRAVENOUS
  Filled 2015-10-31: qty 4

## 2015-10-31 MED ORDER — SODIUM BICARBONATE 8.4 % IV SOLN
INTRAVENOUS | Status: DC
  Administered 2015-10-31 – 2015-11-01 (×2): via INTRAVENOUS
  Filled 2015-10-31 (×4): qty 150

## 2015-10-31 MED ORDER — PHENYLEPHRINE 40 MCG/ML (10ML) SYRINGE FOR IV PUSH (FOR BLOOD PRESSURE SUPPORT)
PREFILLED_SYRINGE | INTRAVENOUS | Status: AC
  Filled 2015-10-31: qty 10

## 2015-10-31 MED ORDER — PANTOPRAZOLE SODIUM 40 MG IV SOLR
40.0000 mg | Freq: Two times a day (BID) | INTRAVENOUS | Status: DC
  Administered 2015-10-31 – 2015-11-05 (×10): 40 mg via INTRAVENOUS
  Filled 2015-10-31 (×10): qty 40

## 2015-10-31 MED ORDER — SODIUM CHLORIDE 0.9 % IV SOLN: Freq: Once | INTRAVENOUS | Status: DC

## 2015-10-31 MED ORDER — PANTOPRAZOLE SODIUM 40 MG IV SOLR
40.0000 mg | Freq: Once | INTRAVENOUS | Status: AC
  Administered 2015-10-31: 40 mg via INTRAVENOUS
  Filled 2015-10-31: qty 40

## 2015-10-31 MED ORDER — VANCOMYCIN HCL IN DEXTROSE 750-5 MG/150ML-% IV SOLN
750.0000 mg | Freq: Two times a day (BID) | INTRAVENOUS | Status: DC
  Administered 2015-11-01 – 2015-11-02 (×3): 750 mg via INTRAVENOUS
  Filled 2015-10-31 (×4): qty 150

## 2015-10-31 MED ORDER — DEXTROSE 5 % IV SOLN
2.0000 g | INTRAVENOUS | Status: DC
  Administered 2015-11-01 – 2015-11-03 (×3): 2 g via INTRAVENOUS
  Filled 2015-10-31 (×5): qty 2

## 2015-10-31 MED ORDER — NOREPINEPHRINE BITARTRATE 1 MG/ML IV SOLN
0.0000 ug/min | INTRAVENOUS | Status: DC
  Filled 2015-10-31: qty 4

## 2015-10-31 MED ORDER — DEXTROSE 5 % IV SOLN
10.0000 mg | INTRAVENOUS | Status: AC
  Administered 2015-10-31: 10 mg via INTRAVENOUS
  Filled 2015-10-31 (×2): qty 1

## 2015-10-31 MED ORDER — DEXTROSE 5 % IV SOLN
2.0000 g | Freq: Once | INTRAVENOUS | Status: AC
  Administered 2015-10-31: 2 g via INTRAVENOUS
  Filled 2015-10-31: qty 2

## 2015-10-31 NOTE — Progress Notes (Signed)
CRITICAL VALUE ALERT  Critical value received: Lactate 7.6  Date of notification:  10/31/15  Time of notification: 2321  Critical value read back:Yes.    Nurse who received alert:  Ernie HewJohn Perrin RN, Holland Fallingebecca Kaelei Wheeler RN  MD notified (1st page):  Dr. Darrick Pennaeterding via elink RN  Time of first page:  2335 (called elink directly)  MD notified (2nd page):  Time of second page:  Responding MD:  Dr. Darrick Pennaeterding  Time MD responded:  Immediately  Also updated MD on other labs, vital signs, assessment findings, transfusion status

## 2015-10-31 NOTE — ED Notes (Signed)
Per EMS, they were called out due to pt being SOB. Pt was lying in bed semi reclined upon their arrival. Pt was alert and oriented upon arrival of EMS. Pts family reported pt has had pink frothy sputum. Pt was placed on C-pap by EMS and given a nitro tab. Pt became very lethargic for EMS and is obtundant on arrival to the ED.

## 2015-10-31 NOTE — ED Notes (Signed)
Unable to review all screening questions due to pts mental status.  

## 2015-10-31 NOTE — Code Documentation (Signed)
Successful intubation by MD Erma HeritageIsaacs.

## 2015-10-31 NOTE — Progress Notes (Signed)
   10/31/15 1500  Clinical Encounter Type  Visited With Family  Visit Type ED  Referral From Nurse  Spiritual Encounters  Spiritual Needs Other (Comment) (Logistical and hospitality)  Stress Factors  Family Stress Factors Lack of knowledge  Responded to request to take family for Trauma C to consult room. Did so and provided blanket, water, and crackers at request of family. Alerted doctor to family's location and kept family informed. Kaena Santori, Chaplain

## 2015-10-31 NOTE — H&P (Signed)
PULMONARY / CRITICAL CARE MEDICINE   Name: [name] MRN: [id number] DOB: 07/09/1961    ADMISSION DATE:  10/31/2015  CHIEF COMPLAINT:  Obtundation   HISTORY OF PRESENT ILLNESS:   55 yo man, hx of EtOH abuse with associated liver disease, HTN, OSA (untreated). Was brought to ED for c/o dyspnea that quickly progressed to obtundation. He required urgent intubation in the ED for airway protection. He developed hypotension post-intubation, started on norepi. Labs revealed severe anemia, hgb 2.7 and thrombocytopenia. PRBC transfusing. No obvious source of bleeding and no bleeding reported by family.  A foley was attempted by unable to be placed - ? Bladder abnormality present. Lactic acid 14, acute renal failure. INR 4.35.   PAST MEDICAL HISTORY :  He  has a past medical history of Hypertension; Allergy; Arthritis; Cataract; Heart murmur; Sleep apnea; and Neuromuscular disorder.  PAST SURGICAL HISTORY: He  has past surgical history that includes Foot surgery (Right); Nasal septum surgery; and Vasectomy.  Allergies  Allergen Reactions  . Lactose Intolerance (Gi)     unknown    No current facility-administered medications on file prior to encounter.   Current Outpatient Prescriptions on File Prior to Encounter  Medication Sig  . hydrochlorothiazide (HYDRODIURIL) 12.5 MG tablet Take 2 tablets (25 mg total) by mouth daily.  Marland Kitchen. lisinopril (PRINIVIL,ZESTRIL) 10 MG tablet Take 1 tablet (10 mg total) by mouth daily.  . meloxicam (MOBIC) 15 MG tablet Take 15 mg by mouth daily.  Marland Kitchen. omeprazole (PRILOSEC) 20 MG capsule Take 20 mg by mouth daily.  . potassium chloride SA (K-DUR,KLOR-CON) 20 MEQ tablet Take 1 tablet (20 mEq total) by mouth once.    FAMILY HISTORY:  His indicated that his mother is alive. He indicated that his father is deceased.   SOCIAL HISTORY: He  reports that he has quit smoking. His smoking use included Cigarettes. He has never used smokeless tobacco. He reports that he  drinks alcohol. He reports that he does not use illicit drugs.  REVIEW OF SYSTEMS:   Unable to obtain  SUBJECTIVE:  Unable to obtain  VITAL SIGNS: BP 90/59 mmHg  Pulse 154  Temp(Src) 97.4 F (36.3 C) (Oral)  Resp 17  SpO2 100%  HEMODYNAMICS:    VENTILATOR SETTINGS: Vent Mode:  [-] PRVC FiO2 (%):  [50 %] 50 % Set Rate:  [16 bmp] 16 bmp Vt Set:  [560 mL] 560 mL PEEP:  [5 cmH20] 5 cmH20 Plateau Pressure:  [20 cmH20] 20 cmH20  INTAKE / OUTPUT:    PHYSICAL EXAMINATION: General:  Pale critically ill man, unresponsive  Neuro:  Slight movement to pain, no response to voice, no movement, breathing over set rate  HEENT:  OP dry, ETT in place Cardiovascular:  Tachycardic, regular  Lungs:  Clear B  Abdomen:  Slightly protuberant, fluid wave present Musculoskeletal:  No deformities, B LE edema present Skin:  Cool feet, no rash  LABS:  BMET  Recent Labs Lab 10/31/15 1505  NA 139  K 4.2  CL 109  CO2 7*  BUN 36*  CREATININE 2.51*  GLUCOSE 189*    Electrolytes  Recent Labs Lab 10/31/15 1505  CALCIUM 7.3*    CBC  Recent Labs Lab 10/31/15 1505  WBC 7.2  HGB 2.7*  HCT 8.9*  PLT 47*    Coag's  Recent Labs Lab 10/31/15 1505  APTT 47*  INR 4.35*    Sepsis Markers  Recent Labs Lab 10/31/15 1523  LATICACIDVEN 14.49*    ABG  Recent Labs  Lab 10/31/15 1553  PHART 7.112*  PCO2ART 30.1*  PO2ART 184.0*    Liver Enzymes  Recent Labs Lab 10/31/15 1505  AST 170*  ALT 41  ALKPHOS 70  BILITOT 4.4*  ALBUMIN 1.6*    Cardiac Enzymes No results for input(s): TROPONINI, PROBNP in the last 168 hours.  Glucose  Recent Labs Lab 10/31/15 1459 10/31/15 1611  GLUCAP 64* 108*    Imaging Dg Chest Portable 1 View  10/31/2015  CLINICAL DATA:  55 year old male status post intubation. EXAM: PORTABLE CHEST 1 VIEW COMPARISON:  Chest x-ray 11/13/2013. FINDINGS: An endotracheal tube is in place with tip 2.8 cm above the carina. A nasogastric tube  is seen extending into the stomach, however, the tip of the nasogastric tube extends below the lower margin of the image. Lung volumes are very low. Opacity at the right base presumably reflects atelectasis in the right middle and lower lobes. Left lung appears relatively clear with minimal subsegmental atelectasis in the left lower lobe. No definite pleural effusions. No evidence of pulmonary edema. Heart size is normal. The patient is rotated to the right on today's exam, resulting in distortion of the mediastinal contours and reduced diagnostic sensitivity and specificity for mediastinal pathology. IMPRESSION: 1. Support apparatus, as above. 2. Low lung volumes with extensive atelectasis in the right middle and lower lobes. Electronically Signed   By: Trudie Reed M.D.   On: 10/31/2015 15:47     STUDIES:   CULTURES: Blood 4/15 >>  Urine 4/15 >>   ANTIBIOTICS: Cefepime 4/15 >>  vanco 4/15 >>   SIGNIFICANT EVENTS  LINES/TUBES: ETT 4/15 >>   DISCUSSION: 55 yo man with reported cirrhosis, presents with shock, severe anemia, acute renal failure.   ASSESSMENT / PLAN:  PULMONARY A: Acute respiratory failure due to AMS and shock P:   PRVC 8cc/kg, PEEP 5, wean FiO2 as able  CARDIOVASCULAR A:  Shock, presumed hemorrhagic, consider also septic or cardiogenic.  SVT P:  Blood and volume resuscitation Wean norepi for MAP > 65 Follow Troponin Check procalcitonin Will likely need CVC, if placed will follow CVP Empiric abx Check TTE to eval LV Fxn Will add sedation, hopefully improve his tachycardia  RENAL A:   Acute renal failure, presumed due to hypoperfusion  Acute metabolic acidosis, lactic acidosis.  P:   Follow BMP with resuscitation Will consult renal if acidosis, electrolytes dictate this; does not appear to need HD based on volume status  GASTROINTESTINAL A:   Cirrhosis associated with coagulopathy P:   Correct inr Consider RUQ [place] vs CT abd/pelvis Will consult  GI. Pt has had suspected cirrhosis for a few years but has not sought care  HEMATOLOGIC A:   Profound anemia with hgb 2.8 Coagulopathy, likely hepatic P:  Transfusing now ? Source of bleed, abd most likely Check hemolysis labs stat Will obtain GI consult if evidence for active GIB Vit K, FFP if active bleeding noted  INFECTIOUS A:   ? Infectious process P:   Pan culture Continue empiric abx   ENDOCRINE A:   Hypergycemia  P:   SSI  NEUROLOGIC A:   Decreased LOC which require intubation Presumed ETOH abuse P:   RASS goal:0 to -1 Add prn sedation   FAMILY  - Updates: Brother updated by EDP as to grave situation with poor prognosis.  - Inter-disciplinary family meet or Palliative Care meeting due by: 11/07/15   Independent CC time 50 minutes  Levy Pupa, MD, PhD 10/31/2015, 6:30 PM [facility] [phone] or if no answer [phone]

## 2015-10-31 NOTE — Progress Notes (Signed)
eLink Physician-Brief Progress Note Patient Name: [name] DOB: 11/17/1960 MRN: [id number]   Date of Service  10/31/2015  HPI/Events of Note  Brother expresses -no CPR Shock , pressors ok D/w his kids  eICU Interventions  Limited code issued     Intervention Category Major Interventions: End of life / care limitation discussion  [name] V. 10/31/2015, 9:44 PM

## 2015-10-31 NOTE — ED Notes (Signed)
Pt converted back into SVT. MD Byrum notified.

## 2015-10-31 NOTE — ED Notes (Signed)
Patient to CT then to take to the floor

## 2015-10-31 NOTE — ED Provider Notes (Signed)
CSN: [id number]     Arrival date & time 10/31/15  1456 History   First MD Initiated Contact with Patient 10/31/15 1459     Chief Complaint  Patient presents with  . Shortness of Breath     (Consider location/radiation/quality/duration/timing/severity/associated sxs/prior Treatment) HPI Comments: 55 year old male with unknown PMHx who presents with AMS, SOB. Per report from EMS, the patient was in his usual status until today when he developed SOB. On EMS arrival, pt was confused and tachypnic. He was placed on BIPAP then became increasingly confused and altered. On arrival, pt lethargic, disoriented, with withdrawal to pain only. Remainder of history limited due to no there family members/witnesses. Pt unable to provide history. Per EMS report, pt tachycardic, hypotensive en route.  The history is provided by the patient.    Past Medical History  Diagnosis Date  . Hypertension   . Allergy   . Arthritis   . Cataract   . Heart murmur   . Sleep apnea     Problems with sleeping  . Neuromuscular disorder     neuropathy rt lateral calf.   Past Surgical History  Procedure Laterality Date  . Foot surgery Right   . Nasal septum surgery    . Vasectomy     Family History  Problem Relation Age of Onset  . Atrial fibrillation Mother   . CVA Mother   . Hypertension Mother   . Emphysema Mother    Social History  Substance Use Topics  . Smoking status: Former Smoker    Types: Cigarettes  . Smokeless tobacco: Never Used  . Alcohol Use: 0.0 oz/week    0 Standard drinks or equivalent per week     Comment: . 4-5 drinks glasses of wine at night.(every night)    Review of Systems  Unable to perform ROS: Patient unresponsive      Allergies  Lactose intolerance (gi)  Home Medications   Prior to Admission medications   Medication Sig Start Date End Date Taking? Authorizing Provider  hydrochlorothiazide (HYDRODIURIL) 12.5 MG tablet Take 2 tablets (25 mg total) by mouth daily.  11/28/14   Ramon Dredge [name], PA-C  lisinopril (PRINIVIL,ZESTRIL) 10 MG tablet Take 1 tablet (10 mg total) by mouth daily. 11/28/14   Ramon Dredge [name], PA-C  meloxicam (MOBIC) 15 MG tablet Take 15 mg by mouth daily.    Historical Provider, MD  omeprazole (PRILOSEC) 20 MG capsule Take 20 mg by mouth daily.    Historical Provider, MD  potassium chloride SA (K-DUR,KLOR-CON) 20 MEQ tablet Take 1 tablet (20 mEq total) by mouth once. 11/28/14   Edward [name], PA-C   BP 73/45 mmHg  Pulse 96  Resp 16  SpO2 100% Physical Exam  Constitutional: He appears lethargic. He has a sickly appearance. He appears ill. He appears distressed.  HENT:  Head: Normocephalic and atraumatic.  Mouth/Throat: No oropharyngeal exudate.  Eyes: Pupils are equal, round, and reactive to light. Left eye exhibits no discharge. Scleral icterus is present.  Jaundice and subconjunctival pallor bilaterally  Neck: Neck supple. JVD present.  Cardiovascular: Normal heart sounds and intact distal pulses.  Tachycardia present.  Exam reveals no friction rub.   No murmur heard. Pulmonary/Chest: No stridor. He is in respiratory distress. He has no wheezes. He has rales. He exhibits no tenderness.  Abdominal: Soft. He exhibits distension. There is no tenderness. There is no guarding.  + fluid wave with ascites. Caput medusae on abdominal wall.  Musculoskeletal: He exhibits edema.  Neurological: He appears lethargic. He  exhibits normal muscle tone. GCS eye subscore is 2. GCS verbal subscore is 1. GCS motor subscore is 4.  Skin: Skin is warm. There is pallor.  Jaundiced  Nursing note and vitals reviewed.   ED Course  .Intubation Date/Time: 11/01/2015 3:26 AM Performed by: Shaune Pollack Authorized by: Shaune Pollack Consent: The procedure was performed in an emergent situation. Patient identity confirmed: arm band Time out: Immediately prior to procedure a "time out" was called to verify the correct patient, procedure, equipment, support  staff and site/side marked as required. Indications: airway protection and  respiratory distress Intubation method: video-assisted Patient status: paralyzed (RSI) Preoxygenation: Bipap. Sedatives: ketmine Paralytic: rocuronium Laryngoscope size: Mac 3 Tube size: 7.5 mm Tube type: cuffed Number of attempts: 1 Cords visualized: yes Post-procedure assessment: chest rise and ETCO2 monitor Breath sounds: equal Cuff inflated: yes Tube secured with: ETT holder Chest x-ray interpreted by me. Chest x-ray findings: endotracheal tube in appropriate position Patient tolerance: Patient tolerated the procedure well with no immediate complications   (including critical care time) Labs Review Labs Reviewed  CBG MONITORING, ED - Abnormal; Notable for the following:    Glucose-Capillary 64 (*)    All other components within normal limits  I-STAT CG4 LACTIC ACID, ED - Abnormal; Notable for the following:    Lactic Acid, Venous 14.49 (*)    All other components within normal limits  I-STAT ARTERIAL BLOOD GAS, ED - Abnormal; Notable for the following:    pH, Arterial 7.112 (*)    pCO2 arterial 30.1 (*)    pO2, Arterial 184.0 (*)    Bicarbonate 9.6 (*)    Acid-base deficit 18.0 (*)    All other components within normal limits  URINE CULTURE  CULTURE, BLOOD (ROUTINE X 2)  CULTURE, BLOOD (ROUTINE X 2)  ACETAMINOPHEN LEVEL  COMPREHENSIVE METABOLIC PANEL  ETHANOL  BRAIN NATRIURETIC PEPTIDE  LIPASE, BLOOD  CBC WITH DIFFERENTIAL/PLATELET  PROTIME-INR  URINALYSIS, ROUTINE W REFLEX MICROSCOPIC (NOT AT [facility])  AMMONIA  APTT  I-STAT CHEM 8, ED  I-STAT TROPOININ, ED  I-STAT TROPOININ, ED  I-STAT CG4 LACTIC ACID, ED  TYPE AND SCREEN    Imaging Review Dg Chest Portable 1 View  10/31/2015  CLINICAL DATA:  55 year old male status post intubation. EXAM: PORTABLE CHEST 1 VIEW COMPARISON:  Chest x-ray 11/13/2013. FINDINGS: An endotracheal tube is in place with tip 2.8 cm above the carina. A  nasogastric tube is seen extending into the stomach, however, the tip of the nasogastric tube extends below the lower margin of the image. Lung volumes are very low. Opacity at the right base presumably reflects atelectasis in the right middle and lower lobes. Left lung appears relatively clear with minimal subsegmental atelectasis in the left lower lobe. No definite pleural effusions. No evidence of pulmonary edema. Heart size is normal. The patient is rotated to the right on today's exam, resulting in distortion of the mediastinal contours and reduced diagnostic sensitivity and specificity for mediastinal pathology. IMPRESSION: 1. Support apparatus, as above. 2. Low lung volumes with extensive atelectasis in the right middle and lower lobes. Electronically Signed   By: Trudie Reed M.D.   On: 10/31/2015 15:47   I have personally reviewed and evaluated these images and lab results as part of my medical decision-making.   EKG Interpretation   Date/Time:  Saturday October 31 2015 14:59:13 EDT Ventricular Rate:  100 PR Interval:  140 QRS Duration: 97 QT Interval:  358 QTC Calculation: 462 R Axis:   79 Text Interpretation:  Sinus tachycardia new Nonspecific repol abnormality,  diffuse leads Baseline wander in lead(s) V1 Confirmed by Anitra LauthPLUNKETT  MD,  Alphonzo LemmingsWHITNEY ([id number]) on 10/31/2015 3:47:36 PM      MDM   55 yo M with unknown PMHx presents with AMS, hypotension, tachycardia. GCS 4-6 on arrival. Pt intubated as above, tolerated well. Will start broad-spectrum ABX, IVF, send stat labs with CXR and CT Head. Initial DDx includes: metabolic encephalopathy, hyperammonemia, CVA, intracranial bleed, severe sepsis, shock 2/2 sepsis, cardiogenic, hepatorenal, renal failure. Pt given 2L NS, neosticks for pressure support.  CXR shows atelectasis, appropriate ETT placement. CT head negative. Labs as above, remarkable for severe anemia (Hgb 2.7), lactic acidosis, elevated INR, hypoglycemia (repleted with D50),  transaminitis, and metabolic acidosis. Pt typed and crossed for 4 units. Vanc/Cefepime given. BP improving with fluids, blood, and IV levophed via PIV (site monitored).  Will admit to ICU.  Discussed with ICU. Family updated. At this time, suspect persistent shock 2/2 severe anemia, decompensated cirrhosis with coagulopathy, and possible sepsis - no signs of UGIB on NGT. No BRBPR. PPI given. ICU team at bedside and will admit.  Clinical Impression: 1. Acute respiratory failure with hypoxia (HCC)   2. Acute respiratory failure (HCC)   3. Shock (HCC)   4. Alcoholic cirrhosis of liver with ascites (HCC)   5. Anemia, unspecified anemia type   6. Lactic acidosis   7. AKI (acute kidney injury) (HCC)   8. Altered mental status, unspecified altered mental status type   9. Elevated INR   10. SVT (supraventricular tachycardia) (HCC)     Disposition: Admit  Condition: Critical  Pt seen in conjunction with Dr. Era SkeenPlunkett     Aalani Aikens, MD 11/01/15 [id number]  Shaune Pollackameron Meilah Delrosario, MD 11/01/15 [id number]  Gwyneth SproutWhitney Plunkett, MD 11/02/15 [phone]

## 2015-10-31 NOTE — ED Notes (Signed)
This RN attempted to place foley catheter. No resistance felt inserted to bifurcation. Foley removed due to no urine output.

## 2015-10-31 NOTE — ED Notes (Signed)
MD notified that pts HR increased to 150's from 100's. EKG captured.

## 2015-10-31 NOTE — ED Notes (Signed)
Attempted foley catheter. Felt no resistance while inserting, however no urine returned. Catheter immediately removed.

## 2015-10-31 NOTE — Progress Notes (Signed)
Pharmacy Antibiotic Note  [name] is a 55 y.o. male admitted on 10/31/2015 with sepsis.  Pharmacy has been consulted for vancomycin, cefepime dosing. eCrCl ~ 40 ml/min, AKI with bump from last year 0.72 > 2.5, LA 14, intubated in ED.  Plan: -Vancomycin 1750 mg IV x1 then 750/12h -Cefepime 2 g IV q 24h -Monitor renal fx, cultures, VT at Css     No data recorded.   Recent Labs Lab 10/31/15 1523  LATICACIDVEN 14.49*    CrCl cannot be calculated (Unknown ideal weight.).    Allergies  Allergen Reactions  . Lactose Intolerance (Gi)     unknown    Antimicrobials this admission: 4/15 cefepime >>  4/15 vancomycin >>   Dose adjustments this admission: NA  Microbiology results: 4/15 BCx:  4/15 UCx:   4/15 Sputum:   4/15 MRSA PCR:   Thank you for allowing pharmacy to be a part of this patient's care.  [name], [name] 10/31/2015 3:29 PM

## 2015-11-01 ENCOUNTER — Inpatient Hospital Stay (HOSPITAL_COMMUNITY): Payer: Federal, State, Local not specified - PPO

## 2015-11-01 ENCOUNTER — Encounter (HOSPITAL_COMMUNITY): Payer: Self-pay | Admitting: *Deleted

## 2015-11-01 ENCOUNTER — Encounter (HOSPITAL_COMMUNITY)
Admission: EM | Disposition: A | Discharge status: Skilled Nursing Facility | Payer: Self-pay | Source: Home / Self Care | Attending: Emergency Medicine

## 2015-11-01 DIAGNOSIS — J9601 Acute respiratory failure with hypoxia: Secondary | ICD-10-CM

## 2015-11-01 DIAGNOSIS — D689 Coagulation defect, unspecified: Secondary | ICD-10-CM

## 2015-11-01 DIAGNOSIS — R579 Shock, unspecified: Secondary | ICD-10-CM

## 2015-11-01 LAB — SODIUM, URINE, RANDOM: Sodium, Ur: <10 mmol/L

## 2015-11-01 LAB — COMPREHENSIVE METABOLIC PANEL
ALK PHOS: 60 U/L (ref 38–126)
ALT: 93 U/L — ABNORMAL HIGH (ref 17–63)
ANION GAP: 11 (ref 5–15)
AST: 500 U/L — ABNORMAL HIGH (ref 15–41)
Albumin: 1.6 g/dL — ABNORMAL LOW (ref 3.5–5.0)
BILIRUBIN TOTAL: 4.8 mg/dL — AB (ref 0.3–1.2)
BUN: 36 mg/dL — ABNORMAL HIGH (ref 6–20)
CALCIUM: 6.9 mg/dL — AB (ref 8.9–10.3)
CO2: 19 mmol/L — ABNORMAL LOW (ref 22–32)
Chloride: 111 mmol/L (ref 101–111)
Creatinine, Ser: 2.24 mg/dL — ABNORMAL HIGH (ref 0.61–1.24)
GFR, EST AFRICAN AMERICAN: 37 mL/min — AB (ref >60)
GFR, EST NON AFRICAN AMERICAN: 32 mL/min — AB (ref >60)
Glucose, Bld: 191 mg/dL — ABNORMAL HIGH (ref 65–99)
Potassium: 3.3 mmol/L — ABNORMAL LOW (ref 3.5–5.1)
SODIUM: 141 mmol/L (ref 135–145)
TOTAL PROTEIN: 6 g/dL — AB (ref 6.5–8.1)

## 2015-11-01 LAB — GLUCOSE, CAPILLARY
GLUCOSE-CAPILLARY: 168 mg/dL — AB (ref 65–99)
GLUCOSE-CAPILLARY: 138 mg/dL — AB (ref 65–99)
GLUCOSE-CAPILLARY: 136 mg/dL — AB (ref 65–99)
GLUCOSE-CAPILLARY: 143 mg/dL — AB (ref 65–99)
GLUCOSE-CAPILLARY: 118 mg/dL — AB (ref 65–99)

## 2015-11-01 LAB — CBC
HCT: 22 % — ABNORMAL LOW (ref 39.0–52.0)
HEMATOCRIT: 18.7 % — AB (ref 39.0–52.0)
Hemoglobin: 7.6 g/dL — ABNORMAL LOW (ref 13.0–17.0)
Hemoglobin: 6.3 g/dL — CL (ref 13.0–17.0)
MCH: 28.7 pg (ref 26.0–34.0)
MCH: 27.5 pg (ref 26.0–34.0)
MCHC: 34.5 g/dL (ref 30.0–36.0)
MCHC: 33.7 g/dL (ref 30.0–36.0)
MCV: 83 fL (ref 78.0–100.0)
MCV: 81.7 fL (ref 78.0–100.0)
Platelets: 26 10*3/uL — CL (ref 150–400)
Platelets: 25 10*3/uL — CL (ref 150–400)
RBC: 2.65 MIL/uL — ABNORMAL LOW (ref 4.22–5.81)
RBC: 2.29 MIL/uL — ABNORMAL LOW (ref 4.22–5.81)
RDW: 16.7 % — ABNORMAL HIGH (ref 11.5–15.5)
RDW: 17.1 % — AB (ref 11.5–15.5)
WBC: 8.3 10*3/uL (ref 4.0–10.5)
WBC: 6.8 10*3/uL (ref 4.0–10.5)

## 2015-11-01 LAB — BLOOD GAS, ARTERIAL
ACID-BASE DEFICIT: 3.4 mmol/L — AB (ref 0.0–2.0)
Bicarbonate: 20.1 mEq/L (ref 20.0–24.0)
Drawn by: 345601
FIO2: 0.4
O2 Saturation: 99.8 %
PEEP: 5 cmH2O
PH ART: 7.442 (ref 7.350–7.450)
PRESSURE SUPPORT: 15 cmH2O
Patient temperature: 98.6
TCO2: 21 mmol/L (ref 0–100)
pCO2 arterial: 29.9 mmHg — ABNORMAL LOW (ref 35.0–45.0)
pO2, Arterial: 184 mmHg — ABNORMAL HIGH (ref 80.0–100.0)

## 2015-11-01 LAB — LACTIC ACID, PLASMA
Lactic Acid, Venous: 2.8 mmol/L (ref 0.5–2.0)
Lactic Acid, Venous: 2.1 mmol/L (ref 0.5–2.0)

## 2015-11-01 LAB — BASIC METABOLIC PANEL
ANION GAP: 11 (ref 5–15)
BUN: 35 mg/dL — AB (ref 6–20)
CHLORIDE: 109 mmol/L (ref 101–111)
CO2: 21 mmol/L — AB (ref 22–32)
Calcium: 7 mg/dL — ABNORMAL LOW (ref 8.9–10.3)
Creatinine, Ser: 2.22 mg/dL — ABNORMAL HIGH (ref 0.61–1.24)
GFR calc Af Amer: 37 mL/min — ABNORMAL LOW (ref >60)
GFR, EST NON AFRICAN AMERICAN: 32 mL/min — AB (ref >60)
GLUCOSE: 149 mg/dL — AB (ref 65–99)
POTASSIUM: 3.1 mmol/L — AB (ref 3.5–5.1)
Sodium: 141 mmol/L (ref 135–145)

## 2015-11-01 LAB — POCT I-STAT 3, ART BLOOD GAS (G3+)
ACID-BASE EXCESS: 1 mmol/L (ref 0.0–2.0)
BICARBONATE: 24.1 meq/L — AB (ref 20.0–24.0)
O2 Saturation: 100 %
PH ART: 7.476 — AB (ref 7.350–7.450)
TCO2: 25 mmol/L (ref 0–100)
pCO2 arterial: 32.8 mmHg — ABNORMAL LOW (ref 35.0–45.0)
pO2, Arterial: 165 mmHg — ABNORMAL HIGH (ref 80.0–100.0)

## 2015-11-01 LAB — PREPARE RBC (CROSSMATCH)

## 2015-11-01 LAB — TROPONIN I
TROPONIN I: 0.04 ng/mL — AB (ref <0.031)
TROPONIN I: 0.04 ng/mL — AB (ref <0.031)
TROPONIN I: 0.05 ng/mL — AB (ref <0.031)
Troponin I: 0.05 ng/mL — ABNORMAL HIGH (ref <0.031)

## 2015-11-01 LAB — HEMOGLOBIN AND HEMATOCRIT, BLOOD
HEMATOCRIT: 21.4 % — AB (ref 39.0–52.0)
HEMOGLOBIN: 7.1 g/dL — AB (ref 13.0–17.0)

## 2015-11-01 LAB — APTT: aPTT: 42 seconds — ABNORMAL HIGH (ref 24–37)

## 2015-11-01 LAB — CREATININE, URINE, RANDOM: CREATININE, URINE: 228.36 mg/dL

## 2015-11-01 LAB — PHOSPHORUS: Phosphorus: 3.4 mg/dL (ref 2.5–4.6)

## 2015-11-01 LAB — AMMONIA: Ammonia: 46 umol/L — ABNORMAL HIGH (ref 9–35)

## 2015-11-01 LAB — PROTIME-INR
INR: 3.1 — ABNORMAL HIGH (ref 0.00–1.49)
Prothrombin Time: 31.4 seconds — ABNORMAL HIGH (ref 11.6–15.2)

## 2015-11-01 LAB — MAGNESIUM: Magnesium: 1.4 mg/dL — ABNORMAL LOW (ref 1.7–2.4)

## 2015-11-01 SURGERY — EGD (ESOPHAGOGASTRODUODENOSCOPY)
Anesthesia: Moderate Sedation

## 2015-11-01 MED ORDER — POTASSIUM CHLORIDE 10 MEQ/50ML IV SOLN
10.0000 meq | INTRAVENOUS | Status: AC
  Administered 2015-11-01 (×6): 10 meq via INTRAVENOUS
  Filled 2015-11-01 (×3): qty 50

## 2015-11-01 MED ORDER — DEXTROSE 5 % IV SOLN
5.0000 mg | Freq: Once | INTRAVENOUS | Status: AC
  Administered 2015-11-01: 5 mg via INTRAVENOUS
  Filled 2015-11-01: qty 0.5

## 2015-11-01 MED ORDER — CHLORHEXIDINE GLUCONATE 0.12 % MT SOLN
15.0000 mL | Freq: Once | OROMUCOSAL | Status: AC
  Administered 2015-10-31: 15 mL via OROMUCOSAL

## 2015-11-01 MED ORDER — DEXTROSE-NACL 5-0.9 % IV SOLN
INTRAVENOUS | Status: DC
  Administered 2015-11-01 (×2): 75 mL/h via INTRAVENOUS
  Administered 2015-11-03: 50 mL via INTRAVENOUS
  Administered 2015-11-04 – 2015-11-05 (×2): via INTRAVENOUS

## 2015-11-01 MED ORDER — LACTATED RINGERS IV BOLUS (SEPSIS)
1000.0000 mL | Freq: Once | INTRAVENOUS | Status: AC
  Administered 2015-11-01: 1000 mL via INTRAVENOUS

## 2015-11-01 MED ORDER — ADENOSINE 6 MG/2ML IV SOLN
INTRAVENOUS | Status: AC
  Administered 2015-11-01: 6 mg via INTRAVENOUS
  Filled 2015-11-01: qty 6

## 2015-11-01 MED ORDER — AMIODARONE HCL IN DEXTROSE 360-4.14 MG/200ML-% IV SOLN
60.0000 mg/h | INTRAVENOUS | Status: AC
  Administered 2015-11-01 (×2): 60 mg/h via INTRAVENOUS
  Filled 2015-11-01 (×2): qty 200

## 2015-11-01 MED ORDER — AMIODARONE LOAD VIA INFUSION
150.0000 mg | Freq: Once | INTRAVENOUS | Status: AC
  Administered 2015-11-01: 150 mg via INTRAVENOUS
  Filled 2015-11-01: qty 83.34

## 2015-11-01 MED ORDER — POTASSIUM CHLORIDE 10 MEQ/50ML IV SOLN
10.0000 meq | INTRAVENOUS | Status: AC
  Administered 2015-11-01 (×4): 10 meq via INTRAVENOUS
  Filled 2015-11-01 (×4): qty 50

## 2015-11-01 MED ORDER — AMIODARONE HCL IN DEXTROSE 360-4.14 MG/200ML-% IV SOLN
30.0000 mg/h | INTRAVENOUS | Status: DC
  Administered 2015-11-01 – 2015-11-03 (×5): 30 mg/h via INTRAVENOUS
  Filled 2015-11-01 (×5): qty 200

## 2015-11-01 MED ORDER — SODIUM CHLORIDE 0.9 % IV SOLN
Freq: Once | INTRAVENOUS | Status: DC
Start: 2015-11-01 — End: 2015-11-01

## 2015-11-01 MED ORDER — ADENOSINE 6 MG/2ML IV SOLN
6.0000 mg | Freq: Once | INTRAVENOUS | Status: AC
  Administered 2015-11-01: 6 mg via INTRAVENOUS

## 2015-11-01 MED ORDER — SODIUM CHLORIDE 0.9 % IV SOLN
Freq: Once | INTRAVENOUS | Status: AC
Start: 2015-11-01 — End: 2015-11-01
  Administered 2015-11-01: 03:00:00 via INTRAVENOUS

## 2015-11-01 MED ORDER — INSULIN ASPART 100 UNIT/ML ~~LOC~~ SOLN
0.0000 [IU] | SUBCUTANEOUS | Status: DC
  Administered 2015-11-01: 1 [IU] via SUBCUTANEOUS
  Administered 2015-11-01: 2 [IU] via SUBCUTANEOUS
  Administered 2015-11-01: 1 [IU] via SUBCUTANEOUS
  Administered 2015-11-02: 3 [IU] via SUBCUTANEOUS
  Administered 2015-11-02: 1 [IU] via SUBCUTANEOUS
  Administered 2015-11-03 (×2): 2 [IU] via SUBCUTANEOUS
  Administered 2015-11-03 (×3): 1 [IU] via SUBCUTANEOUS
  Administered 2015-11-04: 2 [IU] via SUBCUTANEOUS
  Administered 2015-11-04 – 2015-11-06 (×7): 1 [IU] via SUBCUTANEOUS
  Administered 2015-11-06: 2 [IU] via SUBCUTANEOUS
  Administered 2015-11-07 – 2015-11-12 (×10): 1 [IU] via SUBCUTANEOUS

## 2015-11-01 MED ORDER — PHENYLEPHRINE HCL 10 MG/ML IJ SOLN
30.0000 ug/min | INTRAVENOUS | Status: DC
  Administered 2015-11-01: 10 ug/min via INTRAVENOUS
  Filled 2015-11-01: qty 1

## 2015-11-01 MED ORDER — FOLIC ACID 5 MG/ML IJ SOLN
1.0000 mg | Freq: Every day | INTRAMUSCULAR | Status: DC
  Administered 2015-11-01 – 2015-11-05 (×5): 1 mg via INTRAVENOUS
  Filled 2015-11-01 (×6): qty 0.2

## 2015-11-01 MED ORDER — SODIUM CHLORIDE 0.9 % IV SOLN
INTRAVENOUS | Status: DC | PRN
  Administered 2015-11-02: 11:00:00 via INTRA_ARTERIAL

## 2015-11-01 MED ORDER — MAGNESIUM SULFATE 4 GM/100ML IV SOLN
4.0000 g | Freq: Once | INTRAVENOUS | Status: AC
  Administered 2015-11-01: 4 g via INTRAVENOUS
  Filled 2015-11-01: qty 100

## 2015-11-01 MED ORDER — SODIUM CHLORIDE 0.9 % IV SOLN
Freq: Once | INTRAVENOUS | Status: AC
  Administered 2015-11-01: 20:00:00 via INTRAVENOUS

## 2015-11-01 MED ORDER — SODIUM CHLORIDE 0.9 % IV SOLN
Freq: Once | INTRAVENOUS | Status: AC
  Administered 2015-11-01: 05:00:00 via INTRAVENOUS

## 2015-11-01 MED ORDER — ALBUMIN HUMAN 5 % IV SOLN
12.5000 g | Freq: Once | INTRAVENOUS | Status: AC
  Administered 2015-11-01: 12.5 g via INTRAVENOUS
  Filled 2015-11-01: qty 500

## 2015-11-01 MED ORDER — ANTISEPTIC ORAL RINSE SOLUTION (CORINZ)
7.0000 mL | Freq: Four times a day (QID) | OROMUCOSAL | Status: DC
Start: 2015-11-01 — End: 2015-11-02
  Administered 2015-11-01 – 2015-11-02 (×7): 7 mL via OROMUCOSAL

## 2015-11-01 MED ORDER — THIAMINE HCL 100 MG/ML IJ SOLN
100.0000 mg | Freq: Every day | INTRAMUSCULAR | Status: DC
  Administered 2015-11-01 – 2015-11-05 (×5): 100 mg via INTRAVENOUS
  Filled 2015-11-01: qty 2
  Filled 2015-11-01: qty 1
  Filled 2015-11-01: qty 2
  Filled 2015-11-01: qty 1
  Filled 2015-11-01: qty 2

## 2015-11-01 MED ORDER — CHLORHEXIDINE GLUCONATE 0.12% ORAL RINSE (MEDLINE KIT)
15.0000 mL | Freq: Two times a day (BID) | OROMUCOSAL | Status: DC
Start: 2015-11-01 — End: 2015-11-02
  Administered 2015-11-01 – 2015-11-02 (×3): 15 mL via OROMUCOSAL

## 2015-11-01 NOTE — Progress Notes (Signed)
Update called to Dr. Darrick Pennaeterding (elink)

## 2015-11-01 NOTE — Progress Notes (Signed)
eLink Physician-Brief Progress Note Patient Name: [name] DOB: 02/04/1961 MRN: [id number]   Date of Service  11/01/2015  HPI/Events of Note  Hypokalemia and hypomag  eICU Interventions  Potassium and mag replaced        DETERDING,ELIZABETH 11/01/2015, 5:17 AM

## 2015-11-01 NOTE — Progress Notes (Signed)
eLink Physician-Brief Progress Note Patient Name: [name] DOB: 01/29/1961 MRN: [id number]   Date of Service  11/01/2015  HPI/Events of Note  Anemia - Hgb = 6.3.  eICU Interventions  Will transfuse 1 PRBC now.     Intervention Category Intermediate Interventions: Other:  Lenell AntuSommer,Karel Mowers Eugene 11/01/2015, 6:49 PM

## 2015-11-01 NOTE — Plan of Care (Signed)
Problem: Physical Regulation: Goal: Ability to maintain clinical measurements within normal limits will improve Outcome: Progressing Vital signs improving with treatment  Problem: Fluid Volume: Goal: Ability to maintain a balanced intake and output will improve Outcome: Progressing Rehydrating patient

## 2015-11-01 NOTE — Progress Notes (Signed)
eLink Physician-Brief Progress Note Patient Name: [name] DOB: 11/22/1960 MRN: [id number]   Date of Service  11/01/2015  HPI/Events of Note  K+ = 3.1 and Creatinine = 2.22.  eICU Interventions  Will replete K+.     Intervention Category Intermediate Interventions: Electrolyte abnormality - evaluation and management  [name],[name] 11/01/2015, 7:31 PM

## 2015-11-01 NOTE — Progress Notes (Signed)
PULMONARY / CRITICAL CARE MEDICINE   Name: Jacques Fife MRN: [id number] DOB: 09/12/1960    ADMISSION DATE:  10/31/2015  CHIEF COMPLAINT:  Obtundation   SUBJECTIVE:  Needed more PRBC overnight, and got cardioverted for SVT.    VITAL SIGNS: BP 102/77 mmHg  Pulse 92  Temp(Src) 98.6 F (37 C) (Oral)  Resp 11  Ht 6' (1.829 m)  Wt 231 lb 7.7 oz (105 kg)  BMI 31.39 kg/m2  SpO2 100%  HEMODYNAMICS:    VENTILATOR SETTINGS: Vent Mode:  [-] PSV;CPAP FiO2 (%):  [40 %-50 %] 40 % Set Rate:  [16 bmp-24 bmp] 24 bmp Vt Set:  [560 mL] 560 mL PEEP:  [5 cmH20] 5 cmH20 Pressure Support:  [15 cmH20] 15 cmH20 Plateau Pressure:  [17 cmH20-23 cmH20] 19 cmH20  INTAKE / OUTPUT: I/O last 3 completed shifts: In: 8287.5 [I.V.:4736.5; Blood:2141; NG/GT:60; IV Piggyback:1350] Out: 805 [Urine:255; Emesis/NG output:550]  PHYSICAL EXAMINATION: General: ill appearing Neuro: follows simple commands HEENT: ETT in place Cardiac: regular, no murmur Chest: b/l crackles Abd: soft, distended, non tender Ext: lymphedema b/l lower extremities Skin: chronic venous stasis changes   LABS:  BMET  Recent Labs Lab 10/31/15 1505 10/31/15 2045 11/01/15 0420  NA 139 136 141  K 4.2 3.9 3.3*  CL 109 107 111  CO2 7* 11* 19*  BUN 36* 36* 36*  CREATININE 2.51* 2.41* 2.24*  GLUCOSE 189* 148* 191*    Electrolytes  Recent Labs Lab 10/31/15 1505 10/31/15 2045 11/01/15 0420  CALCIUM 7.3* 7.1* 6.9*  MG  --   --  1.4*  PHOS  --   --  3.4    CBC  Recent Labs Lab 10/31/15 1505 10/31/15 1945 11/01/15 0025 11/01/15 0420  WBC 7.2 7.9  --  8.3  HGB 2.7* 5.1* 7.1* 5.7*  HCT 8.9* 16.6* 21.4* 16.9*  PLT 47* 37*  --  23*    Coag's  Recent Labs Lab 10/31/15 1505  APTT 47*  INR 4.35*    Sepsis Markers  Recent Labs Lab 10/31/15 1523 10/31/15 2045 10/31/15 2237  LATICACIDVEN 14.49*  --  7.6*  PROCALCITON  --  0.58  --     ABG  Recent Labs Lab 10/31/15 1553 10/31/15 1846  11/01/15 0425  PHART 7.112* 7.283* 7.442  PCO2ART 30.1* 28.0* 29.9*  PO2ART 184.0* 216.0* 184*    Liver Enzymes  Recent Labs Lab 10/31/15 1505 10/31/15 2045 11/01/15 0420  AST 170* 364* 500*  ALT 41 72* 93*  ALKPHOS 70 74 60  BILITOT 4.4* 5.0* 4.8*  ALBUMIN 1.6* 1.8* 1.6*    Cardiac Enzymes  Recent Labs Lab 10/31/15 2237 11/01/15 0420  TROPONINI 0.04* 0.04*    Glucose  Recent Labs Lab 10/31/15 1459 10/31/15 1611  GLUCAP 64* 108*    Imaging Ct Head Wo Contrast  10/31/2015  CLINICAL DATA:  Alcohol abuse. Dyspnea requiring intubation. Altered mental status. Hypotensive post intubation requiring norepinephrine. EXAM: CT HEAD WITHOUT CONTRAST TECHNIQUE: Contiguous axial images were obtained from the base of the skull through the vertex without intravenous contrast. COMPARISON:  11/12/2013 head CT. FINDINGS: Two oral route tubes enter the upper trachea and hypopharynx on the lateral scout topogram. Mild fluid is seen layering in the nasopharynx. No evidence of parenchymal hemorrhage or extra-axial fluid collection. No mass lesion, mass effect, or midline shift. No CT evidence of acute infarction. Intracranial atherosclerosis. Nonspecific mild subcortical and periventricular white matter hypodensity, most in keeping with chronic small vessel ischemic change. Cerebral volume is age appropriate.  No ventriculomegaly. The visualized paranasal sinuses are essentially clear. The mastoid air cells are unopacified. No evidence of calvarial fracture. IMPRESSION: 1.  No evidence of acute intracranial abnormality. 2. Intracranial atherosclerosis and mild chronic small vessel ischemia. Electronically Signed   By: Delbert PhenixJason A Poff M.D.   On: 10/31/2015 20:33   [place] Abdomen Complete  10/31/2015  CLINICAL DATA:  Inpatient with cirrhosis, shock and renal failure. EXAM: ABDOMEN ULTRASOUND COMPLETE COMPARISON:  01/07/2011 CT abdomen/ pelvis. FINDINGS: Gallbladder: No gallstones or wall thickening  visualized. No sonographic Murphy sign noted by sonographer. Common bile duct: Diameter: 4 mm Liver: Liver parenchyma is diffusely heterogeneous in echotexture in the liver surface is diffusely irregular in contour, in keeping with cirrhosis. There is reversal of flow in the main portal vein. No liver masses demonstrated. IVC: Not visualized. Pancreas: Not well visualized due to overlying bowel gas and other patient related factors for Spleen: Size and appearance within normal limits. Right Kidney: Length: 10.6 cm. Echogenicity within normal limits. No mass or hydronephrosis visualized. Left Kidney: Length: 11.2 cm. Echogenicity within normal limits. No mass or hydronephrosis visualized. Abdominal aorta: No aneurysm visualized. Other findings: Moderate volume ascites. IMPRESSION: 1. Cirrhosis.  No liver mass detected. 2. Reversed (hepatofugal) flow in the main portal vein. Moderate volume ascites. No splenomegaly . 3. Normal gallbladder with no cholelithiasis. No biliary ductal dilatation. Electronically Signed   By: Delbert PhenixJason A Poff M.D.   On: 10/31/2015 23:58   Dg Chest Port 1 View  11/01/2015  CLINICAL DATA:  Central line placement. EXAM: PORTABLE CHEST 1 VIEW COMPARISON:  Yesterday. FINDINGS: Endotracheal tube in satisfactory position. Nasogastric tube extending into the stomach. Interval left jugular catheter with its tip directed superiorly. Poor inspiration. Normal sized heart. Mild increase in bibasilar opacity. Unremarkable bones. IMPRESSION: 1. Poor inspiration with increased bibasilar atelectasis. 2. Left jugular catheter tip directed superiorly. This may be in the right innominate vein, azygos vein or proximal superior vena cava. 3. No pneumothorax. Electronically Signed   By: Beckie SaltsSteven  Reid M.D.   On: 11/01/2015 07:13   Dg Chest Portable 1 View  10/31/2015  CLINICAL DATA:  55 year old male status post intubation. EXAM: PORTABLE CHEST 1 VIEW COMPARISON:  Chest x-ray 11/13/2013. FINDINGS: An endotracheal  tube is in place with tip 2.8 cm above the carina. A nasogastric tube is seen extending into the stomach, however, the tip of the nasogastric tube extends below the lower margin of the image. Lung volumes are very low. Opacity at the right base presumably reflects atelectasis in the right middle and lower lobes. Left lung appears relatively clear with minimal subsegmental atelectasis in the left lower lobe. No definite pleural effusions. No evidence of pulmonary edema. Heart size is normal. The patient is rotated to the right on today's exam, resulting in distortion of the mediastinal contours and reduced diagnostic sensitivity and specificity for mediastinal pathology. IMPRESSION: 1. Support apparatus, as above. 2. Low lung volumes with extensive atelectasis in the right middle and lower lobes. Electronically Signed   By: Trudie Reedaniel  Entrikin M.D.   On: 10/31/2015 15:47     STUDIES:  4/15 CT head >> chronic small vessel ischemic changes 4/15 Abd u/s >> cirrhosis, ascites  CULTURES: Blood 4/15 >>  Urine 4/15 >>   ANTIBIOTICS: Cefepime 4/15 >>  vanco 4/15 >>   SIGNIFICANT EVENTS 4/15 Admit, multiple units of PRBC/FFP; limited code established 4/16 SVT >> cardioversion/start amiodarone; transfuse 2 units PRBC; off pressors  LINES/TUBES: ETT 4/15 >>  Lt IJ CVL 4/16 >>  Rt radial aline 4/16 >>  DISCUSSION: 55 yo male former smoker with alcoholic cirrhosis presents with shock, AKI with severe anemia >> likely acute on chronic blood loss.  PMHx of HTN, Lt leg neuropathy.   ASSESSMENT / PLAN:  PULMONARY A: Acute respiratory failure in setting of shock, severe acidosis, anemia, and pneumonia. P:   Pressure support wean as tolerated >> not ready for extubation yet F/u CXR, ABG  CARDIOVASCULAR A:  Hypovolemic shock 2nd to severe anemia. Hx of HTN. SVT s/p cardioversion 4/16. P:  Continue volume resuscitation Continue amiodarone F/u Echo   RENAL A:   AKI >> baseline creatinine  0.72 from 11/28/14. Acute metabolic acidosis, lactic acidosis >> improved. P:   Continue volume resuscitation >> will give albumin 4/16 Check FeNa D/c HCO3 from IV fluid 4/16 Might need nephrology assessment  GASTROINTESTINAL A:   ETOH cirrhosis with ascites. Presumed chronic GI bleed. P:   NPO Protonix Thiamine, folic acid Consult GI  HEMATOLOGIC A:   Severe anemia >> likely acute on chronic blood loss. Thrombocytopenia, coagulopathy 2nd to ETOH. P:  F/u CBC, INR Vitamin K x one 4/16 Transfuse for Hb < 7 or bleeding F/u Haptoglobin from 4/15  INFECTIOUS A:   Pneumonia. P:   Day 2 vancomycin, cefepime  ENDOCRINE A:   Hypoglycemia. P:   D5 in IV fluid Monitor CBGs  NEUROLOGIC A:   Acute metabolic encephalopathy. P:   RASS goal -1  Goals of care >> No CPR  Updated pt's family at bedside.  CC time 49 minutes.  Coralyn Helling, MD [facility] Pulmonary/Critical Care 11/01/2015, 8:33 AM Pager:  [phone] After 3pm call: [phone]

## 2015-11-01 NOTE — Progress Notes (Signed)
eLink Physician-Brief Progress Note Patient Name: [name] DOB: 02/23/1961 MRN: [id number]   Date of Service  11/01/2015  HPI/Events of Note  Patient just received 2 units pRBC.  Now hypotension with SBP in the 70s.  Also with HR of 162 ST vs other on monitor.  EKG earlier states ST but has been persistent since 8pm.  Only PIVs  eICU Interventions  Plan: Concern that ongoing tachycardia may be aggravating hypotension>>amio bolus/gtt Start NEO gtt for BP support given PIVs and tachycardia Fellow to evaluate at bedside.  CVL but INR is out.     Intervention Category Intermediate Interventions: Arrhythmia - evaluation and management  DETERDING,ELIZABETH 11/01/2015, 1:36 AM

## 2015-11-01 NOTE — Consult Note (Signed)
[facility] Consult: 8:57 AM 11/01/2015  LOS: 1 day    Referring Provider: Dr Lamonte Sakai  Primary Care Physician:  Elise Benne Primary Gastroenterologist:    Valere [phone]   Reason for Consultation:  unassigned   HPI: [name] is a 55 y.o. male.  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 Anemia (Hgb 10.1 in 12/2010) that PMD planned  hematology referral in 11/2014.  Thrombocytopenia of 125 in 12/2010.  Untreated OSA.  EF by 10/2013 echo: EF 55 to  60%.    Family called EMS, transported  to ED 4/15 with dyspnea and obtundation requiring intubation in ED to protect airway. 4 weeks of progressive weakness and altered conciousness.  Tachy at home to 190s per his brother who was called in to see pt that day by the girlfriend.   Anorexia but no reports of emesis or melena Lactic acid 14, INR 4.3. Lipase 270.  T bili 4.8, alk phos 60, AST/ALT 500/93, GGT 635 Coags 40/4.3.   + AKI vs evolution to sage 3 CKD since 11/2014.  Lactic acidosis. Pneumonia on Cefepime/Vanc.  .  .  Small vessel dz and atherosclerosis on head CT Ultrasound shows cirrhosis, new dx for him.  Also hepatofugal (reversed) flow into main portal vein, moderate ascites.  Normal GB.  Normal ducts.   Hgb 12.8 in 11/2014, 2.7 on 4/15, to 5.1 to 7.1 to 5.6.  MCV normal.  PRBCs x 6 in total since the 2.7.   Platelets are 23K. Na level is 141.  FOBT + but no stools or melena as inpt.  Oozing from IV sites and some blood in sputum.  NGT to LIS and no blood there.  S/p multiple blood Products: 6 PRBCs 2 FFP Pt in SVT and cardioverted this AM, amiodarone in place. .  Was on neo but it is discontinued as of 0345 today. sodium bicarb  discontinued this AM  On  BID Protonix.      Past Medical History  Diagnosis Date  . Hypertension   . Allergy   . Arthritis   . Cataract   . Heart murmur   . Sleep apnea     Problems with sleeping  . Neuromuscular disorder (HCC)     neuropathy rt lateral calf.  . Anemia 10/31/2015    Past Surgical History  Procedure Laterality Date  . Foot surgery Right   . Nasal septum surgery    . Vasectomy      Prior to Admission medications   Medication Sig Start Date End Date Taking? Authorizing Provider  hydrochlorothiazide (HYDRODIURIL) 12.5 MG tablet Take 2 tablets (25 mg total) by mouth daily. 11/28/14   Percell Miller [name], PA-C  lisinopril (PRINIVIL,ZESTRIL) 10 MG tablet Take 1 tablet (10 mg total) by mouth daily. 11/28/14   Percell Miller [name], PA-C  meloxicam (MOBIC) 15 MG tablet Take 15 mg by mouth daily.    Historical Provider, MD  omeprazole (PRILOSEC) 20 MG capsule Take 20 mg by mouth  daily.    Historical Provider, MD  potassium chloride SA (K-DUR,KLOR-CON) 20 MEQ tablet Take 1 tablet (20 mEq total) by mouth once. 11/28/14   Percell Miller [name], PA-C    Scheduled Meds: . albumin human  12.5 g Intravenous Once  . antiseptic oral rinse  7 mL Mouth Rinse QID  . ceFEPime (MAXIPIME) IV  2 g Intravenous Q24H  . chlorhexidine gluconate (SAGE KIT)  15 mL Mouth Rinse BID  . folic acid  1 mg Intravenous Daily  . insulin aspart  0-9 Units Subcutaneous 6 times per day  . pantoprazole (PROTONIX) IV  40 mg Intravenous Q12H  . phytonadione (VITAMIN K) IV  5 mg Intravenous Once  . potassium chloride  10 mEq Intravenous Q1 Hr x 6  . thiamine IV  100 mg Intravenous Daily  . vancomycin  750 mg Intravenous Q12H   Infusions: . amiodarone 30 mg/hr (11/01/15 0800)  . dextrose 5 % and 0.9% NaCl    . norepinephrine (LEVOPHED) Adult infusion     PRN Meds: Place/Maintain arterial line **AND** sodium chloride, dextrose, fentaNYL (SUBLIMAZE) injection, fentaNYL (SUBLIMAZE) injection, midazolam, sodium chloride  flush   Allergies as of 10/31/2015 - Review Complete 11/28/2014  Allergen Reaction Noted  . Lactose intolerance (gi)  11/12/2013    Family History  Problem Relation Age of Onset  . Atrial fibrillation Mother   . CVA Mother   . Hypertension Mother   . Emphysema Mother     Social History   Social History  . Marital Status: Single    Spouse Name: N/A  . Number of Children: N/A  . Years of Education: N/A   Occupational History  . Not on file.   Social History Main Topics  . Smoking status: Former Smoker    Types: Cigarettes  . Smokeless tobacco: Never Used  . Alcohol Use: 0.0 oz/week    0 Standard drinks or equivalent per week     Comment: . 4-5 drinks glasses of wine at night.(every night)  . Drug Use: No  . Sexual Activity: Yes   Other Topics Concern  . Not on file   Social History Narrative    REVIEW OF SYSTEMS: Constitutional:  3 to 4 weeks of weakness ENT:  No nose bleeds Pulm:  Progressive dyspnea.  CV:  No palpitations, no LE edema.  GU:  No hematuria, no frequency GI:  No previous EGD or colonoscopy or GERD Heme:  No previous issues with excessive bleeding or bruising.    Transfusions:  Per HPI Neuro:  No headaches, no peripheral tingling or numbness Derm:  No itching, no rash or sores.  Endocrine:  No sweats or chills.  No polyuria or dysuria Immunization:  Not queried Travel:  None beyond local counties in last few months.    PHYSICAL EXAM: Vital signs in last 24 hours: Filed Vitals:   11/01/15 0800 11/01/15 0816  BP: 105/75 105/75  Pulse: 91   Temp:  97.6 F (36.4 C)  Resp: 18 9   Wt Readings from Last 3 Encounters:  11/01/15 105 kg (231 lb 7.7 oz)  11/28/14 89.268 kg (196 lb 12.8 oz)  11/14/14 90.901 kg (200 lb 6.4 oz)    General: ill, intubated, sedated, shaking rigors Head:  No trauma or swelling  Eyes:  No icterus, + pallor Ears:  Unable to assess hearing  Nose:  No congestion  Mouth:  Intubated.   Neck:  No masses Lungs:   Clear in front Heart: RRR.  Rate in 90s  Abdomen:  Distended, tense, NT.  BS quiet.  .   Rectal: deferred   Musc/Skeltl: no joint redness or swelling Extremities:  Marked LE edema and woody  Skin changes.  Looks like lymphedema  Neurologic:  sedated Skin:  No sores     Intake/Output from previous day: 04/15 0701 - 04/16 0700 In: 8287.5 [I.V.:4736.5; Blood:2141; NG/GT:60; IV Piggyback:1350] Out: 805 [Urine:255; Emesis/NG output:550] Intake/Output this shift: Total I/O In: 748.3 [I.V.:283.3; Blood:335; NG/GT:30; IV Piggyback:100] Out: 0   LAB RESULTS:  Recent Labs  10/31/15 1505 10/31/15 1945 11/01/15 0025 11/01/15 0420  WBC 7.2 7.9  --  8.3  HGB 2.7* 5.1* 7.1* 5.7*  HCT 8.9* 16.6* 21.4* 16.9*  PLT 47* 37*  --  23*   BMET Lab Results  Component Value Date   NA 141 11/01/2015   NA 136 10/31/2015   NA 139 10/31/2015   K 3.3* 11/01/2015   K 3.9 10/31/2015   K 4.2 10/31/2015   CL 111 11/01/2015   CL 107 10/31/2015   CL 109 10/31/2015   CO2 19* 11/01/2015   CO2 11* 10/31/2015   CO2 7* 10/31/2015   GLUCOSE 191* 11/01/2015   GLUCOSE 148* 10/31/2015   GLUCOSE 189* 10/31/2015   BUN 36* 11/01/2015   BUN 36* 10/31/2015   BUN 36* 10/31/2015   CREATININE 2.24* 11/01/2015   CREATININE 2.41* 10/31/2015   CREATININE 2.51* 10/31/2015   CALCIUM 6.9* 11/01/2015   CALCIUM 7.1* 10/31/2015   CALCIUM 7.3* 10/31/2015   LFT  Recent Labs  10/31/15 1505 10/31/15 2045 11/01/15 0420  PROT 6.3* 7.3 6.0*  ALBUMIN 1.6* 1.8* 1.6*  AST 170* 364* 500*  ALT 41 72* 93*  ALKPHOS 70 74 60  BILITOT 4.4* 5.0* 4.8*   PT/INR Lab Results  Component Value Date   INR 4.35* 10/31/2015   INR 1.22 01/07/2011   Hepatitis Panel No results for input(s): HEPBSAG, HCVAB, HEPAIGM, HEPBIGM in the last 72 hours. C-Diff No components found for: CDIFF Lipase     Component Value Date/Time   LIPASE 270* 10/31/2015 1505    Drugs of Abuse     Component Value Date/Time   LABOPIA NONE  DETECTED 11/13/2013 0534   COCAINSCRNUR NONE DETECTED 11/13/2013 0534   LABBENZ NONE DETECTED 11/13/2013 0534   AMPHETMU NONE DETECTED 11/13/2013 0534   THCU NONE DETECTED 11/13/2013 0534   LABBARB NONE DETECTED 11/13/2013 0534     RADIOLOGY STUDIES: Ct Head Wo Contrast  10/31/2015  CLINICAL DATA:  Alcohol abuse. Dyspnea requiring intubation. Altered mental status. Hypotensive post intubation requiring norepinephrine. EXAM: CT HEAD WITHOUT CONTRAST TECHNIQUE: Contiguous axial images were obtained from the base of the skull through the vertex without intravenous contrast. COMPARISON:  11/12/2013 head CT. FINDINGS: Two oral route tubes enter the upper trachea and hypopharynx on the lateral scout topogram. Mild fluid is seen layering in the nasopharynx. No evidence of parenchymal hemorrhage or extra-axial fluid collection. No mass lesion, mass effect, or midline shift. No CT evidence of acute infarction. Intracranial atherosclerosis. Nonspecific mild subcortical and periventricular white matter hypodensity, most in keeping with chronic small vessel ischemic change. Cerebral volume is age appropriate. No ventriculomegaly. The visualized paranasal sinuses are essentially clear. The mastoid air cells are unopacified. No evidence of calvarial fracture. IMPRESSION: 1.  No evidence of acute intracranial abnormality. 2. Intracranial atherosclerosis and mild chronic small vessel ischemia. Electronically Signed   By: Ilona Sorrel M.D.   On: 10/31/2015 20:33   US Abdomen Complete  10/31/2015  CLINICAL  DATA:  Inpatient with cirrhosis, shock and renal failure. EXAM: ABDOMEN ULTRASOUND COMPLETE COMPARISON:  01/07/2011 CT abdomen/ pelvis. FINDINGS: Gallbladder: No gallstones or wall thickening visualized. No sonographic Murphy sign noted by sonographer. Common bile duct: Diameter: 4 mm Liver: Liver parenchyma is diffusely heterogeneous in echotexture in the liver surface is diffusely irregular in contour, in keeping  with cirrhosis. There is reversal of flow in the main portal vein. No liver masses demonstrated. IVC: Not visualized. Pancreas: Not well visualized due to overlying bowel gas and other patient related factors for Spleen: Size and appearance within normal limits. Right Kidney: Length: 10.6 cm. Echogenicity within normal limits. No mass or hydronephrosis visualized. Left Kidney: Length: 11.2 cm. Echogenicity within normal limits. No mass or hydronephrosis visualized. Abdominal aorta: No aneurysm visualized. Other findings: Moderate volume ascites. IMPRESSION: 1. Cirrhosis.  No liver mass detected. 2. Reversed (hepatofugal) flow in the main portal vein. Moderate volume ascites. No splenomegaly . 3. Normal gallbladder with no cholelithiasis. No biliary ductal dilatation. Electronically Signed   By: Ilona Sorrel M.D.   On: 10/31/2015 23:58   Dg Chest Port 1 View  11/01/2015  CLINICAL DATA:  Central line placement. EXAM: PORTABLE CHEST 1 VIEW COMPARISON:  Yesterday. FINDINGS: Endotracheal tube in satisfactory position. Nasogastric tube extending into the stomach. Interval left jugular catheter with its tip directed superiorly. Poor inspiration. Normal sized heart. Mild increase in bibasilar opacity. Unremarkable bones. IMPRESSION: 1. Poor inspiration with increased bibasilar atelectasis. 2. Left jugular catheter tip directed superiorly. This may be in the right innominate vein, azygos vein or proximal superior vena cava. 3. No pneumothorax. Electronically Signed   By: Claudie Revering M.D.   On: 11/01/2015 07:13   Dg Chest Portable 1 View  10/31/2015  CLINICAL DATA:  55 year old male status post intubation. EXAM: PORTABLE CHEST 1 VIEW COMPARISON:  Chest x-ray 11/13/2013. FINDINGS: An endotracheal tube is in place with tip 2.8 cm above the carina. A nasogastric tube is seen extending into the stomach, however, the tip of the nasogastric tube extends below the lower margin of the image. Lung volumes are very low. Opacity  at the right base presumably reflects atelectasis in the right middle and lower lobes. Left lung appears relatively clear with minimal subsegmental atelectasis in the left lower lobe. No definite pleural effusions. No evidence of pulmonary edema. Heart size is normal. The patient is rotated to the right on today's exam, resulting in distortion of the mediastinal contours and reduced diagnostic sensitivity and specificity for mediastinal pathology. IMPRESSION: 1. Support apparatus, as above. 2. Low lung volumes with extensive atelectasis in the right middle and lower lobes. Electronically Signed   By: Vinnie Langton M.D.   On: 10/31/2015 15:47    ENDOSCOPIC STUDIES: None ever  IMPRESSION:   *  Decompensated cirrhosis  *  Anemia.  amaxingly low Hgb at admission.  Hgb dropped this AM despite 3 PRBCs in previous 8 hours.    *  Thrombocytopenia.  critical  *  Coagulopathy.  b  *  Ascites.  Eventually needs tap to r/o SBP  *  CAP on vanc/cefepime, on vent.   *  Lactic acidosis  *  AMS.  Ammonia level 46    PLAN:     *  Await pending CBC and coags.  EGD later this AM   Azucena Freed  11/01/2015, 8:57 AM Pager: [phone]

## 2015-11-01 NOTE — Progress Notes (Signed)
CRITICAL VALUE ALERT  Critical value received:  Lactic Acid  Date of notification:  11/01/15  Time of notification:  0935  Critical value read back: Yes  Nurse who received alert:  Domenica Failebekah Nevelyn Mellott, RN  MD notified (1st page):  Jennye MoccasinSarah Gribbin, PA  Time of first page:  [phone]  MD notified (2nd page):  Time of second page:  Responding MD:  Jennye MoccasinSarah Gribbin, PA  Time MD responded:  [phone]

## 2015-11-01 NOTE — Procedures (Signed)
Arterial Catheter Insertion Procedure Note [name] [id number] 04/13/1961  Procedure: Insertion of Arterial Catheter  Indications: Blood pressure monitoring and Frequent blood sampling  Procedure Details Consent: Unable to obtain consent because of emergent medical necessity. Time Out: Verified patient identification, verified procedure, site/side was marked, verified correct patient position, special equipment/implants available, medications/allergies/relevent history reviewed, required imaging and test results available.  Performed  Maximum sterile technique was used including antiseptics, cap, gloves, gown, hand hygiene, mask and sheet. Skin prep: Chlorhexidine; local anesthetic administered 20 gauge catheter was inserted into right radial artery using the Seldinger technique.  Evaluation Blood flow good; BP tracing good. Complications: No apparent complications.   Bradley MedianVictor E Pankaj Carrillo 11/01/2015

## 2015-11-01 NOTE — Progress Notes (Signed)
CRITICAL VALUE ALERT  Critical value received: Hgb 5.7  Date of notification:  11/01/15  Time of notification:  0448  Critical value read back:Yes.    Nurse who received alert:  Ernie HewJohn Perrin RN, Holland Fallingebecca Alycen Mack RN  MD notified (1st page):  Dr. Darrick Pennaeterding (elink)  Time of first page:  [phone], when I called she was already aware, orders being place  MD notified (2nd page):  Time of second page:  Responding MD: n/a  Time MD responded:  n/a

## 2015-11-01 NOTE — Progress Notes (Signed)
eLink Physician-Brief Progress Note Patient Name: [name] DOB: 08/28/1960 MRN: [id number]   Date of Service  11/01/2015  HPI/Events of Note  Hgb drop to 5.7 from 7.1  eICU Interventions  Plan: Transfuse 2 units pRBC Post-transfusion CBC     Intervention Category Intermediate Interventions: Other:  DETERDING,ELIZABETH 11/01/2015, 4:53 AM

## 2015-11-01 NOTE — Progress Notes (Signed)
Dr.Lo Verde at bedside  

## 2015-11-01 NOTE — Progress Notes (Signed)
CRITICAL VALUE ALERT  Critical value received:  Hg 6.3  Date of notification:  11/01/15  Time of notification:  1825  Critical value read back: Yes  Nurse who received alert:  Domenica Failebekah Lamyia Cdebaca, RN  MD notified (1st page):  Dr. Arsenio LoaderSommer  Time of first page:  1830  MD notified (2nd page):  Time of second page:  Responding MD:  Dr. Arsenio LoaderSommer  Time MD responded:  [phone]

## 2015-11-01 NOTE — Procedures (Signed)
CENTRAL VENOUS CATHETER INSERTION   Indication: Access/vasopressors Consent: yes Time out: yes Appropriate position: yes Hand washing: yes Patient Sterilized and Draped: yes Location: Left IJ # of Attempts: 1 Ultrasound Guidance: yes Wire Confirmed with US: yes Insertion depth: 20 cm All Ports Draw and flush: yes CXR:   Pneumothorax: no  Line position appropriate: yes Line sutured in place: yes EBL: 5 cc Complications: no Patient Tolerated Procedure Well: yes     Galvin Profferaniel Raylene Carmickle, DO., MS [facility]

## 2015-11-01 NOTE — Progress Notes (Signed)
Utilization review completed.  

## 2015-11-02 ENCOUNTER — Inpatient Hospital Stay (HOSPITAL_COMMUNITY): Payer: Federal, State, Local not specified - PPO

## 2015-11-02 DIAGNOSIS — K7030 Alcoholic cirrhosis of liver without ascites: Secondary | ICD-10-CM

## 2015-11-02 DIAGNOSIS — E43 Unspecified severe protein-calorie malnutrition: Secondary | ICD-10-CM

## 2015-11-02 DIAGNOSIS — N179 Acute kidney failure, unspecified: Secondary | ICD-10-CM

## 2015-11-02 DIAGNOSIS — R579 Shock, unspecified: Secondary | ICD-10-CM

## 2015-11-02 LAB — COMPREHENSIVE METABOLIC PANEL
ALK PHOS: 61 U/L (ref 38–126)
ALT: 101 U/L — ABNORMAL HIGH (ref 17–63)
ANION GAP: 8 (ref 5–15)
AST: 431 U/L — ABNORMAL HIGH (ref 15–41)
Albumin: 1.9 g/dL — ABNORMAL LOW (ref 3.5–5.0)
BUN: 35 mg/dL — ABNORMAL HIGH (ref 6–20)
CALCIUM: 7 mg/dL — AB (ref 8.9–10.3)
CO2: 23 mmol/L (ref 22–32)
Chloride: 112 mmol/L — ABNORMAL HIGH (ref 101–111)
Creatinine, Ser: 2.13 mg/dL — ABNORMAL HIGH (ref 0.61–1.24)
GFR calc non Af Amer: 34 mL/min — ABNORMAL LOW (ref >60)
GFR, EST AFRICAN AMERICAN: 39 mL/min — AB (ref >60)
Glucose, Bld: 133 mg/dL — ABNORMAL HIGH (ref 65–99)
POTASSIUM: 3.5 mmol/L (ref 3.5–5.1)
SODIUM: 143 mmol/L (ref 135–145)
Total Bilirubin: 5.1 mg/dL — ABNORMAL HIGH (ref 0.3–1.2)
Total Protein: 6.1 g/dL — ABNORMAL LOW (ref 6.5–8.1)

## 2015-11-02 LAB — PREPARE FRESH FROZEN PLASMA
UNIT DIVISION: 0
Unit division: 0

## 2015-11-02 LAB — CBC
HCT: 16.9 % — ABNORMAL LOW (ref 39.0–52.0)
HCT: 21.3 % — ABNORMAL LOW (ref 39.0–52.0)
HEMATOCRIT: 20.8 % — AB (ref 39.0–52.0)
HEMOGLOBIN: 7 g/dL — AB (ref 13.0–17.0)
Hemoglobin: 5.7 g/dL — CL (ref 13.0–17.0)
Hemoglobin: 7.4 g/dL — ABNORMAL LOW (ref 13.0–17.0)
MCH: 28.2 pg (ref 26.0–34.0)
MCH: 28.9 pg (ref 26.0–34.0)
MCH: 27.8 pg (ref 26.0–34.0)
MCHC: 33.7 g/dL (ref 30.0–36.0)
MCHC: 34.7 g/dL (ref 30.0–36.0)
MCHC: 33.7 g/dL (ref 30.0–36.0)
MCV: 83.7 fL (ref 78.0–100.0)
MCV: 83.2 fL (ref 78.0–100.0)
MCV: 82.5 fL (ref 78.0–100.0)
PLATELETS: 23 10*3/uL — AB (ref 150–400)
PLATELETS: 26 10*3/uL — AB (ref 150–400)
Platelets: 43 10*3/uL — ABNORMAL LOW (ref 150–400)
RBC: 2.02 MIL/uL — ABNORMAL LOW (ref 4.22–5.81)
RBC: 2.56 MIL/uL — ABNORMAL LOW (ref 4.22–5.81)
RBC: 2.52 MIL/uL — AB (ref 4.22–5.81)
RDW: 18.3 % — AB (ref 11.5–15.5)
RDW: 16.9 % — AB (ref 11.5–15.5)
RDW: 17.5 % — ABNORMAL HIGH (ref 11.5–15.5)
WBC: 8.3 10*3/uL (ref 4.0–10.5)
WBC: 7.1 10*3/uL (ref 4.0–10.5)
WBC: 9.8 10*3/uL (ref 4.0–10.5)

## 2015-11-02 LAB — ECHOCARDIOGRAM COMPLETE
Height: 72 in
WEIGHTICAEL: 3774.28 [oz_av]

## 2015-11-02 LAB — MAGNESIUM: Magnesium: 1.9 mg/dL (ref 1.7–2.4)

## 2015-11-02 LAB — TROPONIN I
TROPONIN I: 0.05 ng/mL — AB (ref <0.031)
Troponin I: 0.04 ng/mL — ABNORMAL HIGH (ref <0.031)

## 2015-11-02 LAB — BLOOD GAS, ARTERIAL
Acid-base deficit: 0.8 mmol/L (ref 0.0–2.0)
BICARBONATE: 22.5 meq/L (ref 20.0–24.0)
Drawn by: 345601
FIO2: 0.4
LHR: 14 {breaths}/min
O2 SAT: 99.8 %
PCO2 ART: 31.6 mmHg — AB (ref 35.0–45.0)
PEEP/CPAP: 5 cmH2O
PH ART: 7.467 — AB (ref 7.350–7.450)
Patient temperature: 98.6
TCO2: 23.5 mmol/L (ref 0–100)
VT: 560 mL
pO2, Arterial: 168 mmHg — ABNORMAL HIGH (ref 80.0–100.0)

## 2015-11-02 LAB — GLUCOSE, CAPILLARY
GLUCOSE-CAPILLARY: 118 mg/dL — AB (ref 65–99)
GLUCOSE-CAPILLARY: 119 mg/dL — AB (ref 65–99)
GLUCOSE-CAPILLARY: 204 mg/dL — AB (ref 65–99)
Glucose-Capillary: 114 mg/dL — ABNORMAL HIGH (ref 65–99)
Glucose-Capillary: 114 mg/dL — ABNORMAL HIGH (ref 65–99)
Glucose-Capillary: 121 mg/dL — ABNORMAL HIGH (ref 65–99)

## 2015-11-02 LAB — PROTIME-INR
INR: 3.24 — ABNORMAL HIGH (ref 0.00–1.49)
PROTHROMBIN TIME: 32.4 s — AB (ref 11.6–15.2)

## 2015-11-02 LAB — PHOSPHORUS: PHOSPHORUS: 2 mg/dL — AB (ref 2.5–4.6)

## 2015-11-02 LAB — URINE CULTURE: CULTURE: NO GROWTH

## 2015-11-02 MED ORDER — VITAMIN K1 10 MG/ML IJ SOLN
10.0000 mg | Freq: Once | INTRAVENOUS | Status: AC
  Administered 2015-11-02: 10 mg via INTRAVENOUS
  Filled 2015-11-02: qty 1

## 2015-11-02 MED ORDER — ALBUMIN HUMAN 5 % IV SOLN
12.5000 g | Freq: Four times a day (QID) | INTRAVENOUS | Status: AC
  Administered 2015-11-02 – 2015-11-03 (×4): 12.5 g via INTRAVENOUS
  Filled 2015-11-02 (×4): qty 250

## 2015-11-02 NOTE — Plan of Care (Signed)
Problem: Activity: Goal: Risk for activity intolerance will decrease Outcome: Progressing Pt out of bed today  Problem: Fluid Volume: Goal: Ability to maintain a balanced intake and output will improve Outcome: Progressing Pt has had increase in urine output, still edematous  Problem: Nutrition: Goal: Adequate nutrition will be maintained Outcome: Progressing Diet advanced today

## 2015-11-02 NOTE — Progress Notes (Signed)
PULMONARY / CRITICAL CARE MEDICINE   Name: [name] MRN: [id number] DOB: 04/11/1961    ADMISSION DATE:  10/31/2015  CHIEF COMPLAINT:  Obtundation   55 yo male former smoker with alcoholic cirrhosis presents with shock, AKI with severe anemia >> likely acute on chronic blood loss.  PMHx of HTN, Lt leg neuropathy.   STUDIES:  4/15 CT head >> chronic small vessel ischemic changes 4/15 Abd u/s >> cirrhosis, ascites  CULTURES: Blood 4/15 >> ng Urine 4/15 >>  ng ANTIBIOTICS: Cefepime 4/15 >>  vanco 4/15 >>4/17   SIGNIFICANT EVENTS 4/15 Admit, multiple units of PRBC/FFP; limited code established 4/16 SVT >> cardioversion/start amiodarone; transfuse 2 units PRBC; off pressors  SUBJECTIVE:  Afebrile UO picking up Wide awake  VITAL SIGNS: BP 112/82 mmHg  Pulse 91  Temp(Src) 98.9 F (37.2 C) (Axillary)  Resp 10  Ht 6' (1.829 m)  Wt 235 lb 14.3 oz (107 kg)  BMI 31.99 kg/m2  SpO2 100%  HEMODYNAMICS:    VENTILATOR SETTINGS: Vent Mode:  [-] PSV;CPAP FiO2 (%):  [40 %] 40 % Set Rate:  [14 bmp] 14 bmp Vt Set:  [560 mL] 560 mL PEEP:  [5 cmH20] 5 cmH20 Pressure Support:  [5 cmH20-15 cmH20] 8 cmH20 Plateau Pressure:  [16 cmH20-22 cmH20] 22 cmH20  INTAKE / OUTPUT: I/O last 3 completed shifts: In: 9177.4 [I.V.:4736.4; Blood:2141; NG/GT:150; IV Piggyback:2150] Out: 1675 [Urine:1125; Emesis/NG output:550]  PHYSICAL EXAMINATION: General: chr ill appearing Neuro: alert, interactive,follows simple commands HEENT: ETT in place Cardiac: regular, no murmur Chest: b/l crackles Abd: soft, distended, non tender Ext: lymphedema b/l lower extremities Skin: chronic venous stasis changes   LABS:  BMET  Recent Labs Lab 11/01/15 0420 11/01/15 1742 11/02/15 0300  NA 141 141 143  K 3.3* 3.1* 3.5  CL 111 109 112*  CO2 19* 21* 23  BUN 36* 35* 35*  CREATININE 2.24* 2.22* 2.13*  GLUCOSE 191* 149* 133*    Electrolytes  Recent Labs Lab 11/01/15 0420 11/01/15 1742  11/02/15 0300  CALCIUM 6.9* 7.0* 7.0*  MG 1.4*  --  1.9  PHOS 3.4  --  2.0*    CBC  Recent Labs Lab 11/01/15 0843 11/01/15 1742 11/02/15 0300  WBC 8.3 6.8 7.1  HGB 7.6* 6.3* 7.4*  HCT 22.0* 18.7* 21.3*  PLT 26* 25* 26*    Coag's  Recent Labs Lab 10/31/15 1505 11/01/15 0917 11/02/15 0300  APTT 47* 42*  --   INR 4.35* 3.10* 3.24*    Sepsis Markers  Recent Labs Lab 10/31/15 2045 10/31/15 2237 11/01/15 0843 11/01/15 1229  LATICACIDVEN  --  7.6* 2.8* 2.1*  PROCALCITON 0.58  --   --   --     ABG  Recent Labs Lab 11/01/15 0425 11/01/15 1741 11/02/15 0534  PHART 7.442 7.476* 7.467*  PCO2ART 29.9* 32.8* 31.6*  PO2ART 184* 165.0* 168*    Liver Enzymes  Recent Labs Lab 10/31/15 2045 11/01/15 0420 11/02/15 0300  AST 364* 500* 431*  ALT 72* 93* 101*  ALKPHOS 74 60 61  BILITOT 5.0* 4.8* 5.1*  ALBUMIN 1.8* 1.6* 1.9*    Cardiac Enzymes  Recent Labs Lab 11/01/15 1742 11/02/15 0300 11/02/15 0850  TROPONINI 0.05* 0.05* 0.04*    Glucose  Recent Labs Lab 11/01/15 1544 11/01/15 1720 11/01/15 2015 11/01/15 2355 11/02/15 0353 11/02/15 0829  GLUCAP 136* 143* 118* 114* 118* 121*    Imaging Dg Chest Port 1 View  11/02/2015  CLINICAL DATA:  55 year old male with respiratory failure. Subsequent  encounter. EXAM: PORTABLE CHEST 1 VIEW COMPARISON:  11/01/2015. FINDINGS: Endotracheal tube tip 3.6 cm above the carina. Left central line is in place with the tip angulated possibly entering the azygos vein unchanged from prior exam. Lateral view be necessary for further delineation. No gross pneumothorax. Nasogastric tube courses below the diaphragm. Tip is not included on the present exam. Asymmetric airspace disease greatest lung bases more notable on the right may represent pulmonary edema with basilar atelectasis, pleural fluid or infiltrate. Heart size top-normal. Calcified aorta. IMPRESSION: Similar appearance of asymmetric airspace disease most notable  lung bases greater on the right. Left central line tip possibly entering the azygos vein. Please see above. Electronically Signed   By: Lacy Duverney M.D.   On: 11/02/2015 08:07      LINES/TUBES: ETT 4/15 >>  Lt IJ CVL 4/16 >> Rt radial aline 4/16 >>  DISCUSSION:  Ready for extubation , Unclear cause of severe anemia & needs EGD  ASSESSMENT / PLAN:  PULMONARY A: Acute respiratory failure in setting of shock, severe acidosis, anemia, and pneumonia. P:   Pressure support wean as tolerated >> ready for extubation    CARDIOVASCULAR A:  Hypovolemic shock 2nd to severe anemia. Hx of HTN. SVT s/p cardioversion 4/16. Echo EF 50% P:  Continue amiodarone   RENAL A:   AKI >> baseline creatinine 0.72 from 11/28/14. FeNa <1 s/p pre-renal, ? hepato renal Acute metabolic acidosis, lactic acidosis >> improved. P:   albumin for volume expansion D/c HCO3 from IV fluid 4/16 Might need nephrology assessment  GASTROINTESTINAL A:   ETOH cirrhosis with ascites. Presumed chronic GI bleed. P:   NPO Protonix Thiamine, folic acid  GI following  HEMATOLOGIC A:   Severe anemia >> likely acute on chronic blood loss. Thrombocytopenia, coagulopathy 2nd to ETOH. P:  Vitamin K x one 4/16, rpt Transfuse for Hb < 7 or bleeding F/u Haptoglobin from 4/15  INFECTIOUS A:   Pneumonia. P:   Day 3  Cefepime Dc vancomycin  ENDOCRINE A:   Hypoglycemia. P:   D5 in IV fluid Monitor CBGs  NEUROLOGIC A:   Acute metabolic encephalopathy- resolved. P:   RASS goal -1 fent ptn  Goals of care >> No CPR  Updated pt's family at bedside - brother  The patient is critically ill with multiple organ systems failure and requires high complexity decision making for assessment and support, frequent evaluation and titration of therapies, application of advanced monitoring technologies and extensive interpretation of multiple databases. Critical Care Time devoted to patient care services  described in this note independent of APP time is 35 minutes.    Cyril Mourning MD. Tonny Bollman. [facility] Pager [phone] If no response call [phone]   11/02/2015    11/02/2015, 11:55 AM

## 2015-11-02 NOTE — Progress Notes (Signed)
  Echocardiogram 2D Echocardiogram has been performed.  Leta JunglingCooper, Roschelle Calandra M 11/02/2015, 11:43 AM

## 2015-11-02 NOTE — Procedures (Signed)
Extubation Procedure Note  Patient Details:   Name: [name] DOB: 11/04/1960 MRN: [id number]   Airway Documentation:     Evaluation  O2 sats: stable throughout Complications: No apparent complications Patient did tolerate procedure well. Bilateral Breath Sounds: Clear, Diminished   Yes  [name], [name] 11/02/2015, 12:18 PM

## 2015-11-02 NOTE — Progress Notes (Signed)
Patient ID: [name], male   DOB: 1961-07-07, 55 y.o.   MRN: [id number]    Progress Note   Subjective   Pt is alert , wanting tube out-  Brother at bedside, well aware of situation  Off pressors No  Bleeding- NG aspirate brown    Objective   Vital signs in last 24 hours: Temp:  [98.7 F (37.1 C)-99.5 F (37.5 C)] 98.9 F (37.2 C) (04/17 0834) Pulse Rate:  [80-100] 93 (04/17 1000) Resp:  [0-20] 14 (04/17 1000) BP: (86-117)/(58-87) 114/76 mmHg (04/17 1000) SpO2:  [99 %-100 %] 100 % (04/17 1000) Arterial Line BP: (70-127)/(48-79) 118/75 mmHg (04/16 2200) FiO2 (%):  [40 %] 40 % (04/17 1000) Weight:  [235 lb 14.3 oz (107 kg)] 235 lb 14.3 oz (107 kg) (04/17 0500)   General:     AA male  Alert, intubated Heart:  Regular rate and rhythm; no murmurs Lungs: Respirations even and unlabored,decreased bases bilaterally Abdomen:  Soft, nontender, distended fairly tense ascites. Normal bowel sounds. + edema in abd wall Extremities: 2-3+ edema. Neurologic:  Alert and   grossly normal neurologically. Psych:  Cooperative. Normal mood and affect. Neuro: Slightly tremulous but no asterixis, awake, alert, oriented  Intake/Output from previous day: 04/16 0701 - 04/17 0700 In: 4109.9 [I.V.:2549.9; Blood:670; NG/GT:90; IV Piggyback:800] Out: 870 [Urine:870] Intake/Output this shift: Total I/O In: 335.1 [I.V.:305.1; NG/GT:30] Out: 250 [Urine:200; Emesis/NG output:50]  Lab Results:  Recent Labs  11/01/15 0843 11/01/15 1742 11/02/15 0300  WBC 8.3 6.8 7.1  HGB 7.6* 6.3* 7.4*  HCT 22.0* 18.7* 21.3*  PLT 26* 25* 26*   BMET  Recent Labs  11/01/15 0420 11/01/15 1742 11/02/15 0300  NA 141 141 143  K 3.3* 3.1* 3.5  CL 111 109 112*  CO2 19* 21* 23  GLUCOSE 191* 149* 133*  BUN 36* 35* 35*  CREATININE 2.24* 2.22* 2.13*  CALCIUM 6.9* 7.0* 7.0*   LFT  Recent Labs  11/02/15 0300  PROT 6.1*  ALBUMIN 1.9*  AST 431*  ALT 101*  ALKPHOS 61  BILITOT 5.1*   PT/INR  Recent  Labs  11/01/15 0917 11/02/15 0300  LABPROT 31.4* 32.4*  INR 3.10* 3.24*    Studies/Results: Ct Head Wo Contrast  10/31/2015  CLINICAL DATA:  Alcohol abuse. Dyspnea requiring intubation. Altered mental status. Hypotensive post intubation requiring norepinephrine. EXAM: CT HEAD WITHOUT CONTRAST TECHNIQUE: Contiguous axial images were obtained from the base of the skull through the vertex without intravenous contrast. COMPARISON:  11/12/2013 head CT. FINDINGS: Two oral route tubes enter the upper trachea and hypopharynx on the lateral scout topogram. Mild fluid is seen layering in the nasopharynx. No evidence of parenchymal hemorrhage or extra-axial fluid collection. No mass lesion, mass effect, or midline shift. No CT evidence of acute infarction. Intracranial atherosclerosis. Nonspecific mild subcortical and periventricular white matter hypodensity, most in keeping with chronic small vessel ischemic change. Cerebral volume is age appropriate. No ventriculomegaly. The visualized paranasal sinuses are essentially clear. The mastoid air cells are unopacified. No evidence of calvarial fracture. IMPRESSION: 1.  No evidence of acute intracranial abnormality. 2. Intracranial atherosclerosis and mild chronic small vessel ischemia. Electronically Signed   By: Delbert Phenix M.D.   On: 10/31/2015 20:33   US Abdomen Complete  10/31/2015  CLINICAL DATA:  Inpatient with cirrhosis, shock and renal failure. EXAM: ABDOMEN ULTRASOUND COMPLETE COMPARISON:  01/07/2011 CT abdomen/ pelvis. FINDINGS: Gallbladder: No gallstones or wall thickening visualized. No sonographic Murphy sign noted by sonographer. Common bile duct: Diameter:  4 mm Liver: Liver parenchyma is diffusely heterogeneous in echotexture in the liver surface is diffusely irregular in contour, in keeping with cirrhosis. There is reversal of flow in the main portal vein. No liver masses demonstrated. IVC: Not visualized. Pancreas: Not well visualized due to  overlying bowel gas and other patient related factors for Spleen: Size and appearance within normal limits. Right Kidney: Length: 10.6 cm. Echogenicity within normal limits. No mass or hydronephrosis visualized. Left Kidney: Length: 11.2 cm. Echogenicity within normal limits. No mass or hydronephrosis visualized. Abdominal aorta: No aneurysm visualized. Other findings: Moderate volume ascites. IMPRESSION: 1. Cirrhosis.  No liver mass detected. 2. Reversed (hepatofugal) flow in the main portal vein. Moderate volume ascites. No splenomegaly . 3. Normal gallbladder with no cholelithiasis. No biliary ductal dilatation. Electronically Signed   By: Delbert PhenixJason A Poff M.D.   On: 10/31/2015 23:58   Dg Chest Port 1 View  11/02/2015  CLINICAL DATA:  55 year old male with respiratory failure. Subsequent encounter. EXAM: PORTABLE CHEST 1 VIEW COMPARISON:  11/01/2015. FINDINGS: Endotracheal tube tip 3.6 cm above the carina. Left central line is in place with the tip angulated possibly entering the azygos vein unchanged from prior exam. Lateral view be necessary for further delineation. No gross pneumothorax. Nasogastric tube courses below the diaphragm. Tip is not included on the present exam. Asymmetric airspace disease greatest lung bases more notable on the right may represent pulmonary edema with basilar atelectasis, pleural fluid or infiltrate. Heart size top-normal. Calcified aorta. IMPRESSION: Similar appearance of asymmetric airspace disease most notable lung bases greater on the right. Left central line tip possibly entering the azygos vein. Please see above. Electronically Signed   By: Lacy DuverneySteven  Olson M.D.   On: 11/02/2015 08:07   Dg Chest Port 1 View  11/01/2015  CLINICAL DATA:  Central line placement. EXAM: PORTABLE CHEST 1 VIEW COMPARISON:  Yesterday. FINDINGS: Endotracheal tube in satisfactory position. Nasogastric tube extending into the stomach. Interval left jugular catheter with its tip directed superiorly. Poor  inspiration. Normal sized heart. Mild increase in bibasilar opacity. Unremarkable bones. IMPRESSION: 1. Poor inspiration with increased bibasilar atelectasis. 2. Left jugular catheter tip directed superiorly. This may be in the right innominate vein, azygos vein or proximal superior vena cava. 3. No pneumothorax. Electronically Signed   By: Beckie SaltsSteven  Reid M.D.   On: 11/01/2015 07:13   Dg Chest Portable 1 View  10/31/2015  CLINICAL DATA:  55 year old male status post intubation. EXAM: PORTABLE CHEST 1 VIEW COMPARISON:  Chest x-ray 11/13/2013. FINDINGS: An endotracheal tube is in place with tip 2.8 cm above the carina. A nasogastric tube is seen extending into the stomach, however, the tip of the nasogastric tube extends below the lower margin of the image. Lung volumes are very low. Opacity at the right base presumably reflects atelectasis in the right middle and lower lobes. Left lung appears relatively clear with minimal subsegmental atelectasis in the left lower lobe. No definite pleural effusions. No evidence of pulmonary edema. Heart size is normal. The patient is rotated to the right on today's exam, resulting in distortion of the mediastinal contours and reduced diagnostic sensitivity and specificity for mediastinal pathology. IMPRESSION: 1. Support apparatus, as above. 2. Low lung volumes with extensive atelectasis in the right middle and lower lobes. Electronically Signed   By: Trudie Reedaniel  Entrikin M.D.   On: 10/31/2015 15:47       Assessment / Plan:    #1 55 yo male alcoholic critically ill , admitted 4/15 with shock/obtundation/resp failure ,  required vent support- CAP Has weaned off pressors Weaning vent #2 ARF- stable #3 profound   Anemia- HGb 2 on admit ? Of melena prior to admit- no bleeding since admit and hgb stable at 7.4 post 6 units Prbc's Presumably upper GI bleed loss ?chronic /subacute-- will discuss timing of EGD - may do later today while still on Vent Suspect portal gastropathy #4   Severely decompensated ETOH liver disease  woth marked coagulopathy, thrombocytopenia, ascites- MELD =33 Child's C #5 Severe protein calorie malnutrition   LOS: 2 days   Amy Esterwood  11/02/2015, 10:36 AM    [facility] Attending   I have taken an interval history, reviewed the chart and examined the patient. I agree with the Advanced Practitioner's note, impression and recommendations.   Remains critically ill but improved - now extubated.  Has cirrhosis presumably from EtOH with hepatic failure (coagulopathy) and ascites. Profound anemia probably multifactorial - prior bleeding?, bone marrow failure? Has anasarca - cirrhosis, low albumin  Ageree w/ current care - dx studies needed - hepatitis B and C and B12 level EGD could be needed - at this point would like to see PLT and INR improved. Dietitian to see also re #5  Will f/u tomorrow.  Iva Boop, MD, [facility] [phone] (pager) [phone] after 5 PM, weekends and holidays  11/02/2015 1:54 PM

## 2015-11-03 DIAGNOSIS — K7030 Alcoholic cirrhosis of liver without ascites: Secondary | ICD-10-CM

## 2015-11-03 DIAGNOSIS — J9600 Acute respiratory failure, unspecified whether with hypoxia or hypercapnia: Secondary | ICD-10-CM

## 2015-11-03 LAB — CBC WITH DIFFERENTIAL/PLATELET
BASOS ABS: 0 10*3/uL (ref 0.0–0.1)
BASOS PCT: 0 %
EOS PCT: 1 %
Eosinophils Absolute: 0.1 10*3/uL (ref 0.0–0.7)
HEMATOCRIT: 16.8 % — AB (ref 39.0–52.0)
Hemoglobin: 5.7 g/dL — CL (ref 13.0–17.0)
Lymphocytes Relative: 9 %
Lymphs Abs: 0.7 10*3/uL (ref 0.7–4.0)
MCH: 28.5 pg (ref 26.0–34.0)
MCHC: 33.9 g/dL (ref 30.0–36.0)
MCV: 84 fL (ref 78.0–100.0)
MONO ABS: 1.8 10*3/uL — AB (ref 0.1–1.0)
MONOS PCT: 22 %
NEUTROS ABS: 5.6 10*3/uL (ref 1.7–7.7)
Neutrophils Relative %: 69 %
PLATELETS: 63 10*3/uL — AB (ref 150–400)
RBC: 2 MIL/uL — ABNORMAL LOW (ref 4.22–5.81)
RDW: 16.9 % — AB (ref 11.5–15.5)
WBC: 8.1 10*3/uL (ref 4.0–10.5)

## 2015-11-03 LAB — CBC
HCT: 18.8 % — ABNORMAL LOW (ref 39.0–52.0)
HEMATOCRIT: 13.2 % — AB (ref 39.0–52.0)
HEMOGLOBIN: 4.4 g/dL — AB (ref 13.0–17.0)
HEMOGLOBIN: 6.4 g/dL — AB (ref 13.0–17.0)
MCH: 27.8 pg (ref 26.0–34.0)
MCH: 28.8 pg (ref 26.0–34.0)
MCHC: 33.3 g/dL (ref 30.0–36.0)
MCHC: 34 g/dL (ref 30.0–36.0)
MCV: 83.5 fL (ref 78.0–100.0)
MCV: 84.7 fL (ref 78.0–100.0)
Platelets: 30 10*3/uL — ABNORMAL LOW (ref 150–400)
Platelets: 38 10*3/uL — ABNORMAL LOW (ref 150–400)
RBC: 1.58 MIL/uL — ABNORMAL LOW (ref 4.22–5.81)
RBC: 2.22 MIL/uL — AB (ref 4.22–5.81)
RDW: 17.9 % — AB (ref 11.5–15.5)
RDW: 17.8 % — ABNORMAL HIGH (ref 11.5–15.5)
WBC: 6.5 10*3/uL (ref 4.0–10.5)
WBC: 8.4 10*3/uL (ref 4.0–10.5)

## 2015-11-03 LAB — PREPARE FRESH FROZEN PLASMA
UNIT DIVISION: 0
Unit division: 0
Unit division: 0

## 2015-11-03 LAB — BASIC METABOLIC PANEL
ANION GAP: 11 (ref 5–15)
BUN: 34 mg/dL — ABNORMAL HIGH (ref 6–20)
CO2: 20 mmol/L — ABNORMAL LOW (ref 22–32)
Calcium: 6.9 mg/dL — ABNORMAL LOW (ref 8.9–10.3)
Chloride: 108 mmol/L (ref 101–111)
Creatinine, Ser: 2.22 mg/dL — ABNORMAL HIGH (ref 0.61–1.24)
GFR calc Af Amer: 37 mL/min — ABNORMAL LOW (ref >60)
GFR, EST NON AFRICAN AMERICAN: 32 mL/min — AB (ref >60)
Glucose, Bld: 125 mg/dL — ABNORMAL HIGH (ref 65–99)
POTASSIUM: 3.1 mmol/L — AB (ref 3.5–5.1)
SODIUM: 139 mmol/L (ref 135–145)

## 2015-11-03 LAB — PREPARE RBC (CROSSMATCH)

## 2015-11-03 LAB — PHOSPHORUS: PHOSPHORUS: 2 mg/dL — AB (ref 2.5–4.6)

## 2015-11-03 LAB — PROTIME-INR
INR: 3.73 — AB (ref 0.00–1.49)
PROTHROMBIN TIME: 36 s — AB (ref 11.6–15.2)

## 2015-11-03 LAB — GLUCOSE, CAPILLARY
GLUCOSE-CAPILLARY: 121 mg/dL — AB (ref 65–99)
GLUCOSE-CAPILLARY: 123 mg/dL — AB (ref 65–99)
GLUCOSE-CAPILLARY: 140 mg/dL — AB (ref 65–99)
GLUCOSE-CAPILLARY: 164 mg/dL — AB (ref 65–99)
GLUCOSE-CAPILLARY: 167 mg/dL — AB (ref 65–99)
GLUCOSE-CAPILLARY: 91 mg/dL (ref 65–99)

## 2015-11-03 LAB — APTT: APTT: 46 s — AB (ref 24–37)

## 2015-11-03 LAB — HAPTOGLOBIN

## 2015-11-03 LAB — CULTURE, RESPIRATORY: CULTURE: NORMAL

## 2015-11-03 LAB — FIBRINOGEN: FIBRINOGEN: 67 mg/dL — AB (ref 204–475)

## 2015-11-03 LAB — RETICULOCYTES
RBC.: 1.49 MIL/uL — ABNORMAL LOW (ref 4.22–5.81)
Retic Count, Absolute: 52.2 10*3/uL (ref 19.0–186.0)
Retic Ct Pct: 3.5 % — ABNORMAL HIGH (ref 0.4–3.1)

## 2015-11-03 LAB — VITAMIN B12: Vitamin B-12: 2311 pg/mL — ABNORMAL HIGH (ref 180–914)

## 2015-11-03 LAB — CULTURE, RESPIRATORY W GRAM STAIN

## 2015-11-03 LAB — MAGNESIUM: Magnesium: 1.6 mg/dL — ABNORMAL LOW (ref 1.7–2.4)

## 2015-11-03 MED ORDER — PNEUMOCOCCAL VAC POLYVALENT 25 MCG/0.5ML IJ INJ
0.5000 mL | INJECTION | INTRAMUSCULAR | Status: DC
  Filled 2015-11-03: qty 0.5

## 2015-11-03 MED ORDER — SODIUM CHLORIDE 0.9 % IV SOLN: Freq: Once | INTRAVENOUS | Status: DC

## 2015-11-03 MED ORDER — "THROMBI-PAD 3""X3"" EX PADS"
1.0000 | MEDICATED_PAD | Freq: Once | CUTANEOUS | Status: AC
  Administered 2015-11-03: 1 via TOPICAL
  Filled 2015-11-03: qty 1

## 2015-11-03 MED ORDER — SODIUM CHLORIDE 0.9 % IV SOLN: Freq: Once | INTRAVENOUS | Status: AC

## 2015-11-03 MED FILL — Medication: Qty: 1 | Status: AC

## 2015-11-03 NOTE — Progress Notes (Signed)
CRITICAL VALUE ALERT  Critical value received:  Hgb 4.4  Date of notification:  11/03/2015   Time of notification:  0510  Critical value read back:Yes.    Nurse who received alert:  Harvin HazelMegan Warlick  MD notified (1st page):  Mannam  Time of first page:  [phone]  MD notified (2nd page):  Time of second page:  Responding MD:  Mannam  Time MD responded:  [phone]

## 2015-11-03 NOTE — Progress Notes (Signed)
   Patient Name: Teola Bradleyhomas Teehan Date of Encounter: 11/03/2015, 9:27 AM    Subjective  No pain tol liquids He had decline in Hgb overnight - oozing from line site - no stools   Objective  BP 100/69 mmHg  Pulse 100  Temp(Src) 99 F (37.2 C) (Oral)  Resp 22  Ht 6' (1.829 m)  Wt 254 lb 10.1 oz (115.5 kg)  BMI 34.53 kg/m2  SpO2 100% Acutely/chronically ill Scleral icterus Lungs CTA ant Ht tachy s1s2 rrr abd distended w/ edema in wall,BS + NT Ext 3+ edema into thighs (anasarca) Alert and oriented x 3 No asterixis Oozing blood from IJ line site  CBC Latest Ref Rng 11/03/2015 11/02/2015 11/02/2015  WBC 4.0 - 10.5 K/uL 6.5 9.8 7.1  Hemoglobin 13.0 - 17.0 g/dL 4.4(LL) 7.0(L) 7.4(L)  Hematocrit 39.0 - 52.0 % 13.2(L) 20.8(L) 21.3(L)  Platelets 150 - 400 K/uL 30(L) 43(L) 26(LL)   Lab Results  Component Value Date   ALT 101* 11/02/2015   AST 431* 11/02/2015   ALKPHOS 61 11/02/2015   BILITOT 5.1* 11/02/2015   Lab Results  Component Value Date   INR 3.24* 11/02/2015   INR 3.10* 11/01/2015   INR 4.35* 10/31/2015        Assessment and Plan  1) Cirrhosis - decompensated w/ ascites, edema/anasarcaand coagulopathy - hepatic failure 2) Severe protein calorie malnutrition 3) decreased hgb again - no obvious GI bleeding but at risk 4) AKI  Remains critically ill and tenuous  Await post transfusion Hgb Check fibrinogen - may need cryoppt Prognosis is poor If he shows signs of GI bleeding will consider egd but depends upon coag status  Would try to fix that w/ PLT and factors before if we have the option   Iva Booparl E. Gessner, MD, [facility] [phone]) [phone] after 5 PM, weekends and holidays  11/03/2015 9:27 AM

## 2015-11-03 NOTE — Progress Notes (Signed)
PT Cancellation Note  Patient Details Name: [name] MRN: [id number] DOB: 05/01/1961   Cancelled Treatment:    Reason Eval/Treat Not Completed: Medical issues which prohibited therapy; patient with hemoglobin of 4.4.  Will hold on PT today.   Elray McgregorCynthia Lacrisha Bielicki 11/03/2015, 8:54 AM Sheran Lawlessyndi Aissata Wilmore, PT [phone] 11/03/2015

## 2015-11-03 NOTE — Progress Notes (Signed)
   11/03/15 1000  Clinical Encounter Type  Visited With Family;Patient;Health care provider  Visit Type Follow-up;Other (Comment) (ADV Directive paperwork)  Referral From Family  Spiritual Encounters  Spiritual Needs Literature  Advance Directives (For Healthcare)  Does patient have an advance directive? No  Would patient like information on creating an advanced directive? Yes - Educational materials given  [place]  Pt to complete and RN should contact Spiritual Care ([phone]) to arrange notary if possible before 3PM.  10:34 AM [name]

## 2015-11-03 NOTE — Progress Notes (Signed)
Cbc not performed at 1515 due to proximity of blood product infusion, cbc to be performed after current blood infusion complete

## 2015-11-03 NOTE — Progress Notes (Signed)
CRITICAL VALUE ALERT  Critical value received:  Fibrinogen 67  Date of notification:  11/03/15  Time of notification:  1205  Critical value read back:Yes.    Nurse who received alert:  Nelsie Domino rn    Notified Dr. Vassie LollAlva at this time who is on the unit.  See orders.

## 2015-11-03 NOTE — Progress Notes (Signed)
Initial Nutrition Assessment  DOCUMENTATION CODES:   Non-severe (moderate) malnutrition in context of chronic illness  INTERVENTION:   Boost Breeze (berry flavor) po TID, each supplement provides 250 kcal and 9 grams of protein  NUTRITION DIAGNOSIS:   Increased nutrient needs related to chronic illness, wound healing as evidenced by estimated needs  GOAL:   Patient will meet greater than or equal to 90% of their needs  MONITOR:   PO intake, Supplement acceptance, Labs, Weight trends, I & O's  REASON FOR ASSESSMENT:   Consult Assessment of nutrition requirement/status  ASSESSMENT:   55 yo Male wiht PMH of EtOH abuse with associated liver disease, HTN, OSA. Was brought to ED for c/o dyspnea that quickly progressed to obtundation. He required urgent intubation in the ED for airway protection. He developed hypotension post-intubation, started on norepi. Labs revealed severe anemia, hgb 2.7 and thrombocytopenia. PRBC transfusing. No obvious source of bleeding and no bleeding reported by family. A foley was attempted by unable to be placed - ? Bladder abnormality present. Lactic acid 14, acute renal failure. INR 4.35.   Patient a bit slow to answer RD questions; sisters' at bedside. Reports he's eating OK - he had some breakfast.  PO intake 100% per flowsheet records. Reveals however he hadn't been eating well for about a month PTA. Suspects he's lost weight but unable to quantify amount or time frame. Amenable to berry flavored Boost Breeze; he does not care for Ens[facility] and/or Boost.  Nutrition-Focused physical exam completed. Findings are mild fat depletion, mild muscle depletion, and severe fluid accumulation.   Diet Order:  Diet full liquid Room service appropriate?: Yes; Fluid consistency:: Thin  Skin:  Wound (see comment) (Stage II to buttocks, coccyx & elbow)  Last BM:  N/A  Height:   Ht Readings from Last 1 Encounters:  10/31/15 6' (1.829 m)    Weight:    Wt Readings from Last 1 Encounters:  11/03/15 254 lb 10.1 oz (115.5 kg)  10/31/15         218 lb (99.1 kg)  Ideal Body Weight:  81 kg  BMI:  Body mass index is 34.53 kg/(m^2). -- highly skewed   Estimated Nutritional Needs:   Kcal:  2150-2350  Protein:  120-130 gm  Fluid:  per MD  EDUCATION NEEDS:   No education needs identified at this time  Maureen ChattersKatie Makaley Storts, RD, LDN Pager #: [phone] After-Hours Pager #: [phone]

## 2015-11-03 NOTE — Progress Notes (Signed)
eLink Physician-Brief Progress Note Patient Name: [name] DOB: 12/27/1960 MRN: [id number]   Date of Service  11/03/2015  HPI/Events of Note  Hb 4.4. No sign of active bleed except for slow ooze from CVC site  eICU Interventions  Transfuse 2 units PRBC     Intervention Category Major Interventions: Other:  Christyanna Mckeon 11/03/2015, 5:16 AM

## 2015-11-03 NOTE — Progress Notes (Signed)
eLink Physician-Brief Progress Note Patient Name: [name] Rakes DOB: 09/17/1960 MRN: [id number]   Date of Service  11/03/2015  HPI/Events of Note  Oozing from CVC site  eICU Interventions  Thrombi pad dressings     Intervention Category Evaluation Type: Other  Ethne Jeon 11/03/2015, 3:33 AM

## 2015-11-03 NOTE — Progress Notes (Signed)
Pharmacy Antibiotic Note  [name] is a 55 y.o. male admitted on 10/31/2015 with pneumonia.  Pt remains culture negative, on day #4 cefepime. Scr remains elevated at 2.2, of note pt is also fairly thrombocytopenic.   Plan: -Cefepime 2 g IV q 24h -Monitor renal fx, cultures -Monitor duration of therapy   Height: 6' (182.9 cm) Weight: 235 lb 14.3 oz (107 kg) IBW/kg (Calculated) : 77.6  Temp (24hrs), Avg:98.7 F (37.1 C), Min:98.3 F (36.8 C), Max:99 F (37.2 C)   Recent Labs Lab 10/31/15 1523  10/31/15 2045 10/31/15 2237 11/01/15 0420 11/01/15 0843 11/01/15 1229 11/01/15 1742 11/02/15 0300 11/02/15 1558 11/03/15 0442  WBC  --   < >  --   --  8.3 8.3  --  6.8 7.1 9.8 6.5  CREATININE  --   --  2.41*  --  2.24*  --   --  2.22* 2.13*  --  2.22*  LATICACIDVEN 14.49*  --   --  7.6*  --  2.8* 2.1*  --   --   --   --   < > = values in this interval not displayed.  Estimated Creatinine Clearance: 48.7 mL/min (by C-G formula based on Cr of 2.22).    Allergies  Allergen Reactions  . Lactose Intolerance (Gi)     unknown    Antimicrobials this admission: 4/15 cefepime >>  4/15 vancomycin >> 4/17  Dose adjustments this admission: NA  Microbiology results: 4/15 BCx: ngtd 4/15 UCx:  ngtd 4/15 Sputum:  NF 4/15 MRSA PCR: neg  Thank you for allowing pharmacy to be a part of this patient's care.  Baldemar FridayMasters, Jaideep Pollack M 11/03/2015 7:54 AM

## 2015-11-03 NOTE — Progress Notes (Signed)
PULMONARY / CRITICAL CARE MEDICINE   Name: [name] MRN: [id number] DOB: 10/06/1960    ADMISSION DATE:  10/31/2015  CHIEF COMPLAINT:  Obtundation   55 yo male former smoker with alcoholic cirrhosis presents with shock, AKI with severe anemia >> likely acute on chronic blood loss.  PMHx of HTN, Lt leg neuropathy.   STUDIES:  4/15 CT head >> chronic small vessel ischemic changes 4/15 Abd u/s >> cirrhosis, ascites  CULTURES: Blood 4/15 >> ng Urine 4/15 >> ng   ANTIBIOTICS: Cefepime 4/15 >>  vanco 4/15 >>4/17   SIGNIFICANT EVENTS 4/15 Admit, multiple units of PRBC/FFP; limited code established 4/16 SVT >> cardioversion/start amiodarone; transfuse 2 units PRBC; off pressors 4/17 extubated  SUBJECTIVE:  Afebrile UO poor overnight Hb dropped again - being transfused  VITAL SIGNS: BP 118/71 mmHg  Pulse 100  Temp(Src) 99 F (37.2 C) (Oral)  Resp 28  Ht 6' (1.829 m)  Wt 254 lb 10.1 oz (115.5 kg)  BMI 34.53 kg/m2  SpO2 99%  HEMODYNAMICS:    VENTILATOR SETTINGS: Vent Mode:  [-] PSV;CPAP FiO2 (%):  [40 %] 40 % PEEP:  [5 cmH20] 5 cmH20 Pressure Support:  [8 cmH20] 8 cmH20  INTAKE / OUTPUT: I/O last 3 completed shifts: In: 6294.1 [P.O.:1180; I.V.:3209.1; Blood:365; NG/GT:90; IV Piggyback:1450] Out: 1330 [Urine:1260; Emesis/NG output:70]  PHYSICAL EXAMINATION: General: chr ill appearing Neuro: alert, interactive,follows simple commands HEENT:pale , no JVD, icterus + Cardiac: regular, no murmur Chest: b/l crackles Abd: soft, distended, non tender Ext: lymphedema b/l lower extremities Skin: chronic venous stasis changes   LABS:  BMET  Recent Labs Lab 11/01/15 1742 11/02/15 0300 11/03/15 0442  NA 141 143 139  K 3.1* 3.5 3.1*  CL 109 112* 108  CO2 21* 23 20*  BUN 35* 35* 34*  CREATININE 2.22* 2.13* 2.22*  GLUCOSE 149* 133* 125*    Electrolytes  Recent Labs Lab 11/01/15 0420 11/01/15 1742 11/02/15 0300 11/03/15 0442  CALCIUM 6.9* 7.0*  7.0* 6.9*  MG 1.4*  --  1.9 1.6*  PHOS 3.4  --  2.0* 2.0*    CBC  Recent Labs Lab 11/02/15 0300 11/02/15 1558 11/03/15 0442  WBC 7.1 9.8 6.5  HGB 7.4* 7.0* 4.4*  HCT 21.3* 20.8* 13.2*  PLT 26* 43* 30*    Coag's  Recent Labs Lab 10/31/15 1505 11/01/15 0917 11/02/15 0300  APTT 47* 42*  --   INR 4.35* 3.10* 3.24*    Sepsis Markers  Recent Labs Lab 10/31/15 2045 10/31/15 2237 11/01/15 0843 11/01/15 1229  LATICACIDVEN  --  7.6* 2.8* 2.1*  PROCALCITON 0.58  --   --   --     ABG  Recent Labs Lab 11/01/15 0425 11/01/15 1741 11/02/15 0534  PHART 7.442 7.476* 7.467*  PCO2ART 29.9* 32.8* 31.6*  PO2ART 184* 165.0* 168*    Liver Enzymes  Recent Labs Lab 10/31/15 2045 11/01/15 0420 11/02/15 0300  AST 364* 500* 431*  ALT 72* 93* 101*  ALKPHOS 74 60 61  BILITOT 5.0* 4.8* 5.1*  ALBUMIN 1.8* 1.6* 1.9*    Cardiac Enzymes  Recent Labs Lab 11/01/15 1742 11/02/15 0300 11/02/15 0850  TROPONINI 0.05* 0.05* 0.04*    Glucose  Recent Labs Lab 11/02/15 1220 11/02/15 1604 11/02/15 1942 11/03/15 0018 11/03/15 0408 11/03/15 0737  GLUCAP 119* 114* 204* 91 121* 123*    Imaging No results found.    LINES/TUBES: ETT 4/15 >> 4/17 Lt IJ CVL 4/16 >> Rt radial aline 4/16 >>4/17  DISCUSSION:  Unclear  cause of severe anemia - low haptoglobin raises concern for hemolysis  ASSESSMENT / PLAN:  PULMONARY A: Acute respiratory failure in setting of shock, severe acidosis, anemia, and pneumonia. P:   Resolved    CARDIOVASCULAR A:  Hypovolemic shock 2nd to severe anemia. Hx of HTN. SVT s/p cardioversion 4/16. Echo EF 50% P:  Dc  amiodarone   RENAL A:   AKI >> baseline creatinine 0.72 from 11/28/14. FeNa <1 s/p pre-renal, ? hepato renal Acute metabolic acidosis, lactic acidosis >> improved. P:   Use blood products for volume expansion Might need nephrology assessment  GASTROINTESTINAL A:   ETOH cirrhosis with ascites. Presumed  chronic GI bleed. P:   NPO Protonix Thiamine, folic acid  GI following, EGD deferred  HEMATOLOGIC A:   Severe anemia >> likely acute on chronic blood loss, retic inappropriately low, low haptoglobin raises concern for hemolysis Thrombocytopenia, coagulopathy 2nd to ETOH S/p Vitamin K x2 P:  Transfuse for Hb < 7 or bleeding -2U today, await rpt Transfuse plts Will give FFP if INR 2 & above   INFECTIOUS A:   Pneumonia. Concern for SBP P:   Day 4  Cefepime  ENDOCRINE A:   Hypoglycemia. P:   D5 in IV fluid Monitor CBGs  NEUROLOGIC A:   Acute metabolic encephalopathy- resolved. P:   fent prn pain  Goals of care >> No CPR, need to establish POA, Palliative care consult requested  Updated pt's family at bedside - brother    The patient is critically ill with multiple organ systems failure and requires high complexity decision making for assessment and support, frequent evaluation and titration of therapies, application of advanced monitoring technologies and extensive interpretation of multiple databases. Critical Care Time devoted to patient care services described in this note independent of APP time is 35 minutes.    Cyril Mourning MD. Tonny Bollman. [facility] Pager [phone] If no response call [phone]    11/03/2015, 10:59 AM

## 2015-11-04 DIAGNOSIS — R791 Abnormal coagulation profile: Secondary | ICD-10-CM | POA: Insufficient documentation

## 2015-11-04 DIAGNOSIS — R000 Tachycardia, unspecified: Secondary | ICD-10-CM

## 2015-11-04 DIAGNOSIS — K7031 Alcoholic cirrhosis of liver with ascites: Secondary | ICD-10-CM | POA: Insufficient documentation

## 2015-11-04 DIAGNOSIS — J9690 Respiratory failure, unspecified, unspecified whether with hypoxia or hypercapnia: Secondary | ICD-10-CM

## 2015-11-04 DIAGNOSIS — N179 Acute kidney failure, unspecified: Secondary | ICD-10-CM | POA: Insufficient documentation

## 2015-11-04 DIAGNOSIS — E872 Acidosis, unspecified: Secondary | ICD-10-CM | POA: Insufficient documentation

## 2015-11-04 DIAGNOSIS — J9601 Acute respiratory failure with hypoxia: Secondary | ICD-10-CM | POA: Insufficient documentation

## 2015-11-04 DIAGNOSIS — R4182 Altered mental status, unspecified: Secondary | ICD-10-CM | POA: Insufficient documentation

## 2015-11-04 DIAGNOSIS — Z515 Encounter for palliative care: Secondary | ICD-10-CM

## 2015-11-04 DIAGNOSIS — I4891 Unspecified atrial fibrillation: Secondary | ICD-10-CM

## 2015-11-04 DIAGNOSIS — K7200 Acute and subacute hepatic failure without coma: Secondary | ICD-10-CM

## 2015-11-04 DIAGNOSIS — R188 Other ascites: Secondary | ICD-10-CM

## 2015-11-04 LAB — TYPE AND SCREEN
ABO/RH(D): O POS
ANTIBODY SCREEN: NEGATIVE
UNIT DIVISION: 0
UNIT DIVISION: 0
UNIT DIVISION: 0
UNIT DIVISION: 0
UNIT DIVISION: 0
UNIT DIVISION: 0
Unit division: 0
Unit division: 0
Unit division: 0
Unit division: 0
Unit division: 0
Unit division: 0

## 2015-11-04 LAB — PREPARE PLATELET PHERESIS: Unit division: 0

## 2015-11-04 LAB — DIRECT ANTIGLOBULIN TEST (NOT AT ARMC)
DAT, IgG: NEGATIVE
DAT, complement: NEGATIVE

## 2015-11-04 LAB — CBC
HCT: 22.4 % — ABNORMAL LOW (ref 39.0–52.0)
HEMOGLOBIN: 7.9 g/dL — AB (ref 13.0–17.0)
MCH: 29.5 pg (ref 26.0–34.0)
MCHC: 35.3 g/dL (ref 30.0–36.0)
MCV: 83.6 fL (ref 78.0–100.0)
PLATELETS: 59 10*3/uL — AB (ref 150–400)
RBC: 2.68 MIL/uL — AB (ref 4.22–5.81)
RDW: 15.6 % — ABNORMAL HIGH (ref 11.5–15.5)
WBC: 9.1 10*3/uL (ref 4.0–10.5)

## 2015-11-04 LAB — BODY FLUID CELL COUNT WITH DIFFERENTIAL
LYMPHS FL: 82 %
MONOCYTE-MACROPHAGE-SEROUS FLUID: 12 % — AB (ref 50–90)
NEUTROPHIL FLUID: 6 % (ref 0–25)
WBC FLUID: 5 uL (ref 0–1000)

## 2015-11-04 LAB — MAGNESIUM
Magnesium: 1.5 mg/dL — ABNORMAL LOW (ref 1.7–2.4)
Magnesium: 1.7 mg/dL (ref 1.7–2.4)

## 2015-11-04 LAB — GRAM STAIN

## 2015-11-04 LAB — BASIC METABOLIC PANEL
ANION GAP: 9 (ref 5–15)
Anion gap: 11 (ref 5–15)
BUN: 38 mg/dL — ABNORMAL HIGH (ref 6–20)
BUN: 39 mg/dL — ABNORMAL HIGH (ref 6–20)
CALCIUM: 7.3 mg/dL — AB (ref 8.9–10.3)
CALCIUM: 7.4 mg/dL — AB (ref 8.9–10.3)
CO2: 19 mmol/L — AB (ref 22–32)
CO2: 18 mmol/L — ABNORMAL LOW (ref 22–32)
CREATININE: 2.52 mg/dL — AB (ref 0.61–1.24)
CREATININE: 2.58 mg/dL — AB (ref 0.61–1.24)
Chloride: 108 mmol/L (ref 101–111)
Chloride: 107 mmol/L (ref 101–111)
GFR calc non Af Amer: 27 mL/min — ABNORMAL LOW (ref >60)
GFR, EST AFRICAN AMERICAN: 32 mL/min — AB (ref >60)
GFR, EST AFRICAN AMERICAN: 31 mL/min — AB (ref >60)
GFR, EST NON AFRICAN AMERICAN: 28 mL/min — AB (ref >60)
Glucose, Bld: 125 mg/dL — ABNORMAL HIGH (ref 65–99)
Glucose, Bld: 141 mg/dL — ABNORMAL HIGH (ref 65–99)
Potassium: 3 mmol/L — ABNORMAL LOW (ref 3.5–5.1)
Potassium: 3 mmol/L — ABNORMAL LOW (ref 3.5–5.1)
SODIUM: 136 mmol/L (ref 135–145)
Sodium: 136 mmol/L (ref 135–145)

## 2015-11-04 LAB — PREPARE CRYOPRECIPITATE
UNIT DIVISION: 0
UNIT DIVISION: 0

## 2015-11-04 LAB — GLUCOSE, CAPILLARY
GLUCOSE-CAPILLARY: 117 mg/dL — AB (ref 65–99)
Glucose-Capillary: 114 mg/dL — ABNORMAL HIGH (ref 65–99)
Glucose-Capillary: 129 mg/dL — ABNORMAL HIGH (ref 65–99)
Glucose-Capillary: 137 mg/dL — ABNORMAL HIGH (ref 65–99)
Glucose-Capillary: 147 mg/dL — ABNORMAL HIGH (ref 65–99)
Glucose-Capillary: 159 mg/dL — ABNORMAL HIGH (ref 65–99)

## 2015-11-04 LAB — HEPATITIS PANEL, ACUTE
HCV AB: 0.1 {s_co_ratio} (ref 0.0–0.9)
HEP A IGM: NEGATIVE
HEP B C IGM: NEGATIVE
Hepatitis B Surface Ag: NEGATIVE

## 2015-11-04 LAB — ALBUMIN, FLUID (OTHER): Albumin, Fluid: <1 g/dL

## 2015-11-04 LAB — CBC WITH DIFFERENTIAL/PLATELET
BASOS ABS: 0 10*3/uL (ref 0.0–0.1)
Basophils Relative: 0 %
Eosinophils Absolute: 0.2 10*3/uL (ref 0.0–0.7)
Eosinophils Relative: 2 %
HEMATOCRIT: 20.2 % — AB (ref 39.0–52.0)
Hemoglobin: 7.2 g/dL — ABNORMAL LOW (ref 13.0–17.0)
LYMPHS ABS: 0.8 10*3/uL (ref 0.7–4.0)
LYMPHS PCT: 7 %
MCH: 29.8 pg (ref 26.0–34.0)
MCHC: 35.6 g/dL (ref 30.0–36.0)
MCV: 83.5 fL (ref 78.0–100.0)
Monocytes Absolute: 3.1 10*3/uL — ABNORMAL HIGH (ref 0.1–1.0)
Monocytes Relative: 30 %
NEUTROS ABS: 6.1 10*3/uL (ref 1.7–7.7)
Neutrophils Relative %: 60 %
Platelets: 66 10*3/uL — ABNORMAL LOW (ref 150–400)
RBC: 2.42 MIL/uL — AB (ref 4.22–5.81)
RDW: 16.2 % — ABNORMAL HIGH (ref 11.5–15.5)
WBC: 10.1 10*3/uL (ref 4.0–10.5)

## 2015-11-04 LAB — PROTIME-INR
INR: 2.77 — ABNORMAL HIGH (ref 0.00–1.49)
PROTHROMBIN TIME: 28.8 s — AB (ref 11.6–15.2)

## 2015-11-04 LAB — SAVE SMEAR

## 2015-11-04 LAB — PREPARE RBC (CROSSMATCH)

## 2015-11-04 LAB — LACTATE DEHYDROGENASE, PLEURAL OR PERITONEAL FLUID: LD FL: 57 U/L — AB (ref 3–23)

## 2015-11-04 LAB — PHOSPHORUS: PHOSPHORUS: 2.6 mg/dL (ref 2.5–4.6)

## 2015-11-04 LAB — LACTATE DEHYDROGENASE: LDH: 353 U/L — ABNORMAL HIGH (ref 98–192)

## 2015-11-04 LAB — RETICULOCYTES
RBC.: 2.7 MIL/uL — ABNORMAL LOW (ref 4.22–5.81)
RETIC COUNT ABSOLUTE: 97.2 10*3/uL (ref 19.0–186.0)
Retic Ct Pct: 3.6 % — ABNORMAL HIGH (ref 0.4–3.1)

## 2015-11-04 LAB — ALBUMIN: Albumin: 2.1 g/dL — ABNORMAL LOW (ref 3.5–5.0)

## 2015-11-04 LAB — FIBRINOGEN: Fibrinogen: 119 mg/dL — ABNORMAL LOW (ref 204–475)

## 2015-11-04 MED ORDER — METRONIDAZOLE IN NACL 5-0.79 MG/ML-% IV SOLN
500.0000 mg | Freq: Three times a day (TID) | INTRAVENOUS | Status: DC
  Filled 2015-11-04 (×3): qty 100

## 2015-11-04 MED ORDER — "THROMBI-PAD 3""X3"" EX PADS"
1.0000 | MEDICATED_PAD | Freq: Once | CUTANEOUS | Status: AC
  Administered 2015-11-04: 1 via TOPICAL
  Filled 2015-11-04: qty 1

## 2015-11-04 MED ORDER — LIDOCAINE HCL (PF) 1 % IJ SOLN
INTRAMUSCULAR | Status: AC
  Administered 2015-11-04: 03:00:00
  Filled 2015-11-04: qty 5

## 2015-11-04 MED ORDER — FUROSEMIDE 10 MG/ML IJ SOLN
80.0000 mg | Freq: Once | INTRAMUSCULAR | Status: AC
  Administered 2015-11-04: 80 mg via INTRAVENOUS
  Filled 2015-11-04: qty 8

## 2015-11-04 MED ORDER — PIPERACILLIN-TAZOBACTAM 3.375 G IVPB
3.3750 g | Freq: Three times a day (TID) | INTRAVENOUS | Status: DC
  Administered 2015-11-04 – 2015-11-05 (×3): 3.375 g via INTRAVENOUS
  Filled 2015-11-04 (×5): qty 50

## 2015-11-04 MED ORDER — AMIODARONE IV BOLUS ONLY 150 MG/100ML
150.0000 mg | Freq: Once | INTRAVENOUS | Status: AC
  Administered 2015-11-04: 150 mg via INTRAVENOUS

## 2015-11-04 MED ORDER — SODIUM CHLORIDE 0.9 % IV SOLN: Freq: Once | INTRAVENOUS | Status: DC

## 2015-11-04 MED ORDER — POTASSIUM CHLORIDE 10 MEQ/50ML IV SOLN
10.0000 meq | INTRAVENOUS | Status: AC
  Administered 2015-11-04 (×4): 10 meq via INTRAVENOUS
  Filled 2015-11-04 (×3): qty 50

## 2015-11-04 MED ORDER — POTASSIUM CHLORIDE 20 MEQ/15ML (10%) PO SOLN
40.0000 meq | Freq: Once | ORAL | Status: AC
  Administered 2015-11-04: 40 meq via ORAL
  Filled 2015-11-04: qty 30

## 2015-11-04 MED ORDER — VITAMIN K1 10 MG/ML IJ SOLN
10.0000 mg | Freq: Once | INTRAMUSCULAR | Status: AC
  Administered 2015-11-04: 10 mg via INTRAVENOUS
  Filled 2015-11-04 (×2): qty 1

## 2015-11-04 MED ORDER — AMIODARONE HCL IN DEXTROSE 360-4.14 MG/200ML-% IV SOLN
30.0000 mg/h | INTRAVENOUS | Status: DC
  Administered 2015-11-04 – 2015-11-05 (×5): 30 mg/h via INTRAVENOUS
  Filled 2015-11-04 (×10): qty 200

## 2015-11-04 MED ORDER — POTASSIUM CHLORIDE 10 MEQ/50ML IV SOLN
10.0000 meq | INTRAVENOUS | Status: DC
  Filled 2015-11-04: qty 50

## 2015-11-04 MED ORDER — SODIUM CHLORIDE 0.9 % IV SOLN
Freq: Once | INTRAVENOUS | Status: AC
  Administered 2015-11-04: 12:00:00 via INTRAVENOUS

## 2015-11-04 MED ORDER — MAGNESIUM SULFATE 2 GM/50ML IV SOLN
2.0000 g | Freq: Once | INTRAVENOUS | Status: AC
  Administered 2015-11-04: 2 g via INTRAVENOUS
  Filled 2015-11-04: qty 50

## 2015-11-04 NOTE — Progress Notes (Signed)
Pharmacy Antibiotic Note  [name] is a 55 y.o. male admitted on 10/31/2015 with pneumonia and concern for SBP.  Pt remains culture negative, on day #5 cefepime. Scr remains elevated at 2.5, of note pt is also fairly thrombocytopenic.    Plan: -D/C cefepime -Zosyn 3.375 gm q8h  -Monitor renal fx, cultures -Monitor duration of therapy   Height: 6' (182.9 cm) Weight: 257 lb 4.4 oz (116.7 kg) IBW/kg (Calculated) : 77.6  Temp (24hrs), Avg:99 F (37.2 C), Min:98.5 F (36.9 C), Max:99.8 F (37.7 C)   Recent Labs Lab 10/31/15 1523  10/31/15 2237 11/01/15 0420 11/01/15 0843 11/01/15 1229 11/01/15 1742 11/02/15 0300 11/02/15 1558 11/03/15 0442 11/03/15 1040 11/03/15 2245 11/04/15 0623  WBC  --   < >  --  8.3 8.3  --  6.8 7.1 9.8 6.5 8.4 8.1 9.1  CREATININE  --   < >  --  2.24*  --   --  2.22* 2.13*  --  2.22*  --   --  2.52*  LATICACIDVEN 14.49*  --  7.6*  --  2.8* 2.1*  --   --   --   --   --   --   --   < > = values in this interval not displayed.  Estimated Creatinine Clearance: 44.7 mL/min (by C-G formula based on Cr of 2.52).    Allergies  Allergen Reactions  . Lactose Intolerance (Gi)     unknown    Antimicrobials this admission: 4/15 cefepime >> 4/19 4/15 vancomycin >> 4/17 4/19 zosyn >>  Dose adjustments this admission: NA  Microbiology results: 4/15 BCx: ngtd 4/15 UCx:  ngtd 4/15 Sputum:  NF 4/15 MRSA PCR: neg  Thank you for allowing pharmacy to be a part of this patient's care.  [name] 11/04/2015 10:59 AM

## 2015-11-04 NOTE — Progress Notes (Signed)
PT Cancellation Note  Patient Details Name: [name] MRN: [id number] DOB: 02/03/1961   Cancelled Treatment:    Reason Eval/Treat Not Completed: Patient not medically ready.  Spoke with RN and HR continues to be A-fib in 150s.  Will hold PT and mobility at this time and Carrillo/u as appropriate.     Sunny SchleinRitenour, [name], [place] CarolinaPT [phone] 11/04/2015, 11:02 AM

## 2015-11-04 NOTE — Progress Notes (Signed)
Patient with excessive bleeding from central line. Thrombi pad applied @ 0100. Patient still bleeding after application. Critical care MDs made aware. BladenRaul, [place] @ bedside to suture line. Line sutured without problem. New thrombipad applied. New blood noticed on dressing. MD made aware.  Patient went into episode of tachycardia, undetermined rhythm around 0230. Andrey CampanileWilson, MD made aware. EKG obtained and showed afib with RVR. Awaiting orders at this time.

## 2015-11-04 NOTE — Progress Notes (Signed)
Patient still with an elevated HR to the 150s. Patient will occasionally drop to the low 100s but then come back up to the 150s. Andrey CampanileWilson, MD made aware. No new orders at this time.

## 2015-11-04 NOTE — Procedures (Signed)
US abdo  1. Massive large ascites diffuse, no loculations  Mcarthur Rossettianiel J. Tyson AliasFeinstein, MD, FACP Pgr: [phone]  Pulmonary & Critical Care '

## 2015-11-04 NOTE — Progress Notes (Signed)
Patient with increased HR to the 150s. EKG obtained and showed SVT. Andrey CampanileWilson, MD made aware. Amio gtt to be started.

## 2015-11-04 NOTE — Progress Notes (Signed)
eLink Physician-Brief Progress Note Patient Name: [name] DOB: 06/21/1961 MRN: [id number]   Date of Service  11/04/2015  HPI/Events of Note  hgb 5.7  eICU Interventions  Transfuse 2 units PRBC's     Intervention Category Major Interventions: Hemorrhage - evaluation and management  Lewanda Perea 11/04/2015, 12:25 AM

## 2015-11-04 NOTE — Progress Notes (Addendum)
Pt tachy overnight with intermittent rates into the 160s.   Evaluated patient.  Hemodynamically stable.  Denies chest pain, dyspnea.  Does feel heart beating fast.   VSS with exception of tachycardia  General:  Patient in bed watching tv. Heart:  Irregular, no m/r/g Lungs:  Clear anterior Abd:  + BS, distended (similar to prior documented exams), non-tender LE:  3+ pitting edema B/L Neuro:  AAO x 3, responding appropriately  55 year old male with EtOH cirrhosis admitted with shock 2/2 acute on chronic blood loss anemia s/p multiple transfusions of blood products, previously intubated 04/15, extubated 04/17, currently with narrow QRS tachy with rapid rate.  EKG - irregular, no p discernable p waves, 138 bpm, possibly afib.  Patient was on amiodarone 04/16 - 04/18 for SVT. - resume amiodarone gtt  - continue close monitoring of hemodynamic status in ICU  Evelena PeatAlex Wilson, DO IMTS PGY3 [phone]

## 2015-11-04 NOTE — Consult Note (Signed)
[facility] CONSULT NOTE  Patient Care Team: Esperanza Richters, PA-C as PCP - General (Physician Assistant)  CHIEF COMPLAINTS/PURPOSE OF CONSULTATION:  Severe anemia and thrombocytopenia with cirrhosis  HISTORY OF PRESENTING ILLNESS:  [name] 55 y.o. male is admitted to the hospital since 10/31/2015 with severe dyspnea and Obtundation. He was intubated in the emergency room. He developed hypotension and in the emergency room was found to have a hemoglobin of 2.7 and severe thrombocytopenia. He was transfused 3 units of packed red cells. There was no clear source of bleeding identified. He was also found to be in renal failure. He was later extubated. He continues to have multiple medical problems including shock, acidosis, severe anemia, severe pancytopenia and pneumonia. Because of profound low levels of fibrinogen, patient was given cryoprecipitate yesterday along with fresh frozen plasma, blood and platelets. We were consulted to assist with hematology management in this complex and critically ill patient. He is awake alert. Overnight he had multiple episodes of atrial fibrillation with tachycardia in the 150s and 160s. He definitely appears to be more dyspneic. He has been on IV antibiotics with cefepime. This morning his hemoglobin is 7.9 with a platelet count of 59. His INR is 2.77 and a creatinine of 2.5.  I reviewed her records extensively and collaborated the history with the patient.   MEDICAL HISTORY:  Past Medical History  Diagnosis Date  . Hypertension   . Allergy   . Arthritis   . Cataract   . Heart murmur   . Sleep apnea     Problems with sleeping  . Neuromuscular disorder (HCC)     neuropathy rt lateral calf.  . 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 Foot surgery Right   . Nasal septum surgery    . Vasectomy      SOCIAL HISTORY: Social History   Social  History  . Marital Status: Single    Spouse Name: N/A  . Number of Children: N/A  . Years of Education: N/A   Occupational History  . Not on file.   Social History Main Topics  . Smoking status: Former Smoker    Types: Cigarettes  . Smokeless tobacco: Never Used  . Alcohol Use: 0.0 oz/week    0 Standard drinks or equivalent per week     Comment: . 4-5 drinks glasses of wine at night.(every night)  . Drug Use: No  . Sexual Activity: Yes   Other Topics Concern  . Not on file   Social History Narrative    FAMILY HISTORY: Family History  Problem Relation Age of Onset  . Atrial fibrillation Mother   . CVA Mother   . Hypertension Mother   . Emphysema Mother     ALLERGIES:  is allergic to lactose intolerance (gi).  MEDICATIONS:  Current Facility-Administered Medications  Medication Dose Route Frequency Provider Last Rate Last Dose  . 0.9 %  sodium chloride infusion   Intravenous Once Praveen Mannam, MD      . 0.9 %  sodium chloride infusion   Intravenous Once Cyril Mourning V, MD      . 0.9 %  sodium chloride infusion   Intravenous Once Cyril Mourning V, MD      . 0.9 %  sodium chloride infusion   Intravenous Once Yolanda Manges, DO   Stopped at 11/04/15 0328  . 0.9 %  sodium chloride infusion   Intravenous Once Kurian Kasa,  MD   Stopped at 11/04/15 0328  . amiodarone (NEXTERONE PREMIX) 360 MG/200ML (1.8 mg/mL) IV infusion  30 mg/hr Intravenous Continuous Yolanda MangesAlex M Wilson, DO 16.7 mL/hr at 11/04/15 0408 30 mg/hr at 11/04/15 0408  . ceFEPIme (MAXIPIME) 2 g in dextrose 5 % 50 mL IVPB  2 g Intravenous Q24H Darl HouseholderAlison M Masters, RPH   2 g at 11/03/15 1627  . dextrose 5 %-0.9 % sodium chloride infusion   Intravenous Continuous Oretha Milchakesh Alva V, MD 50 mL/hr at 11/04/15 0501    . dextrose 50 % solution   Intravenous PRN Shaune Pollackameron Isaacs, MD   1 ampule at 10/31/15 1506  . folic acid injection 1 mg  1 mg Intravenous Daily Coralyn HellingVineet Sood, MD   1 mg at 11/03/15 1003  . insulin aspart (novoLOG) injection 0-9  Units  0-9 Units Subcutaneous 6 times per day Coralyn HellingVineet Sood, MD   1 Units at 11/04/15 0102  . magnesium sulfate IVPB 2 g 50 mL  2 g Intravenous Once Gust RungErik C Hoffman, DO   2 g at 11/04/15 0747  . pantoprazole (PROTONIX) injection 40 mg  40 mg Intravenous Q12H Leslye Peerobert S Byrum, MD   40 mg at 11/03/15 2157  . pneumococcal 23 valent vaccine (PNU-IMMUNE) injection 0.5 mL  0.5 mL Intramuscular Tomorrow-1000 Cyril Mourningakesh Alva V, MD      . potassium chloride 10 mEq in 50 mL *CENTRAL LINE* IVPB  10 mEq Intravenous Q1 Hr x 4 Gust RungErik C Hoffman, DO   10 mEq at 11/04/15 0747  . sodium chloride flush (NS) 0.9 % injection 3 mL  3 mL Intravenous PRN Leslye Peerobert S Byrum, MD   10 mL at 11/01/15 2257  . thiamine (B-1) injection 100 mg  100 mg Intravenous Daily Coralyn HellingVineet Sood, MD   100 mg at 11/03/15 1004    REVIEW OF SYSTEMS:   Constitutional: Critically ill-appearing patient. Eyes: Mild jaundice Respiratory: Shortness of breath Cardiovascular: Fast heartbeat Gastrointestinal:  Denies any blood in the stool Skin: Bruising Neurological: Generalized weakness but answers questions appropriately.   All other systems were reviewed with the patient and are negative.  PHYSICAL EXAMINATION: ECOG PERFORMANCE STATUS: 3 - Symptomatic, >50% confined to bed  Filed Vitals:   11/04/15 0600 11/04/15 0729  BP: 111/75   Pulse: 92   Temp:  98.6 F (37 C)  Resp: 8    Filed Weights   11/02/15 0500 11/03/15 0754 11/04/15 0500  Weight: 235 lb 14.3 oz (107 kg) 254 lb 10.1 oz (115.5 kg) 257 lb 4.4 oz (116.7 kg)    GENERAL:Ill-appearing with shortness of breath SKIN: Bruises EYES: Mild yellowing of conjunctiva LUNGS: Tachypnea HEART: Atrial fibrillation tachycardia ABDOMEN: Markedly distended with ascites PSYCH: alert & oriented x 3 with fluent speech NEURO: no focal motor/sensory deficits  LABORATORY DATA:  I have reviewed the data as listed Lab Results  Component Value Date   WBC 9.1 11/04/2015   HGB 7.9* 11/04/2015   HCT 22.4*  11/04/2015   MCV 83.6 11/04/2015   PLT 59* 11/04/2015   Lab Results  Component Value Date   NA 136 11/04/2015   K 3.0* 11/04/2015   CL 108 11/04/2015   CO2 19* 11/04/2015    RADIOGRAPHIC STUDIES: I have personally reviewed the radiological reports and agreed with the findings in the report. Ultrasound abdomen: Cirrhosis, moderate volume ascites no splenomegaly  ASSESSMENT AND PLAN:  1. Severe anemia with thrombocytopenia: I reviewed the peripheral blood smear. Although there were some burst cells, there were no significant  increase in schistocytes. The blood work was reviewed extensively. There is elevation of LDH and decreased haptoglobin however these could be related to cirrhosis of the liver rather than hemolysis. I would like to order a direct Coombs test. If the direct Coombs test is positive then I would recommend giving systemic steroid therapy. If the direct Coombs test is negative, I would attribute his severe anemia to bone marrow suppression and would recommend continue supportive therapy with blood transfusions and platelet transfusions as needed.  2. 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 3. Atrial fibrillation with tachycardia: Currently on amiodarone. 4. Acute renal failure 5. Ascites related to cirrhosis: Unfortunately abdomen cannot be tapped with his severe coagulopathy. 6. Respiratory failure related to severe anemia, cirrhosis  Unfortunately the patient has extremely poor prognosis and the patient and his family appear to understand this.      Sabas Sous, MD @

## 2015-11-04 NOTE — Progress Notes (Addendum)
PULMONARY / CRITICAL CARE MEDICINE   Name: [name] MRN: [id number] DOB: 02/02/1961    ADMISSION DATE:  10/31/2015  CHIEF COMPLAINT:  Obtundation   55 yo male former smoker with alcoholic cirrhosis presents with shock, AKI with severe anemia >> likely acute on chronic blood loss.  PMHx of HTN, Lt leg neuropathy.   STUDIES:  4/15 CT head >> chronic small vessel ischemic changes 4/15 Abd u/s >> cirrhosis, ascites  CULTURES: Blood 4/15 >> ng Urine 4/15 >> ng   ANTIBIOTICS: Cefepime 4/15 >>  vanco 4/15 >>4/17   SIGNIFICANT EVENTS 4/15 Admit, multiple units of PRBC/FFP; limited code established 4/16 SVT >> cardioversion/start amiodarone; transfuse 2 units PRBC; off pressors 4/17 extubated 4/18 Hgb continues to drop  SUBJECTIVE:  Hb dropped again - being transfused another 2 units Reports not sleeping well, otherwise no complaints  VITAL SIGNS: BP 101/71 mmHg  Pulse 104  Temp(Src) 98.6 F (37 C) (Oral)  Resp 15  Ht 6' (1.829 m)  Wt 116.7 kg (257 lb 4.4 oz)  BMI 34.89 kg/m2  SpO2 100%  HEMODYNAMICS:    VENTILATOR SETTINGS:    INTAKE / OUTPUT: I/O last 3 completed shifts: In: 6066.4 [P.O.:820; I.V.:2268.4; Blood:2428; IV Piggyback:550] Out: 375 [Urine:375]  PHYSICAL EXAMINATION: General: chr ill appearing Neuro: alert, interactive,follows simple commands HEENT:pale, no JVD, icterus + Cardiac: irregular, no murmur Chest: b/l basilar crackles Abd: soft, distended, non tender Ext: 2+ pitting edema bilaterally Skin: chronic venous stasis changes, small amount of blood at left IJ bandage   LABS:  BMET  Recent Labs Lab 11/02/15 0300 11/03/15 0442 11/04/15 0623  NA 143 139 136  K 3.5 3.1* 3.0*  CL 112* 108 108  CO2 23 20* 19*  BUN 35* 34* 38*  CREATININE 2.13* 2.22* 2.52*  GLUCOSE 133* 125* 125*    Electrolytes  Recent Labs Lab 11/02/15 0300 11/03/15 0442 11/04/15 0623  CALCIUM 7.0* 6.9* 7.3*  MG 1.9 1.6* 1.5*  PHOS 2.0* 2.0* 2.6     CBC  Recent Labs Lab 11/03/15 1040 11/03/15 2245 11/04/15 0623  WBC 8.4 8.1 9.1  HGB 6.4* 5.7* 7.9*  HCT 18.8* 16.8* 22.4*  PLT 38* 63* 59*    Coag's  Recent Labs Lab 10/31/15 1505 11/01/15 0917 11/02/15 0300 11/03/15 1040 11/04/15 0623  APTT 47* 42*  --  46*  --   INR 4.35* 3.10* 3.24* 3.73* 2.77*    Sepsis Markers  Recent Labs Lab 10/31/15 2045 10/31/15 2237 11/01/15 0843 11/01/15 1229  LATICACIDVEN  --  7.6* 2.8* 2.1*  PROCALCITON 0.58  --   --   --     ABG  Recent Labs Lab 11/01/15 0425 11/01/15 1741 11/02/15 0534  PHART 7.442 7.476* 7.467*  PCO2ART 29.9* 32.8* 31.6*  PO2ART 184* 165.0* 168*    Liver Enzymes  Recent Labs Lab 10/31/15 2045 11/01/15 0420 11/02/15 0300  AST 364* 500* 431*  ALT 72* 93* 101*  ALKPHOS 74 60 61  BILITOT 5.0* 4.8* 5.1*  ALBUMIN 1.8* 1.6* 1.9*    Cardiac Enzymes  Recent Labs Lab 11/01/15 1742 11/02/15 0300 11/02/15 0850  TROPONINI 0.05* 0.05* 0.04*    Glucose  Recent Labs Lab 11/03/15 0737 11/03/15 1133 11/03/15 1520 11/03/15 2014 11/04/15 0053 11/04/15 0333  GLUCAP 123* 167* 140* 164* 129* 117*    Imaging No results found.    LINES/TUBES: ETT 4/15 >> 4/17 Lt IJ CVL 4/16 >> Rt radial aline 4/16 >>4/17  DISCUSSION:  Unclear cause of severe anemia - low  haptoglobin raises concern for hemolysis  ASSESSMENT / PLAN:  PULMONARY A: Acute respiratory failure in setting of shock, severe acidosis, anemia, and pneumonia. P:   Resolved  CARDIOVASCULAR A:  Hypovolemic shock 2nd to severe anemia. Hx of HTN. SVT s/p cardioversion 4/16. Echo EF 50% Tachycardia SVT vs A fib P:  Amiodarone restarted overnight, now HR improved, continue amiodarone  prbc as needed Keep k greater 4, mag greater 2  RENAL A:   AKI >> baseline creatinine 0.72 from 11/28/14. FeNa <1 s/p pre-renal, ? hepato renal Acute metabolic acidosis, lactic acidosis >> improved. Hypokalemia Hypomagnesemia P:    - 4 runs of IV potassium - 2g mag - Repeat BMP this afternoon -Monitor, no sign of uremia  GASTROINTESTINAL A:   ETOH cirrhosis with ascites. Presumed chronic GI bleed. P:   NPO Protonix Thiamine, folic acid  GI following, EGD deferred  HEMATOLOGIC A:   Severe anemia >> likely acute on chronic blood loss, retic inappropriately low, low haptoglobin raises concern for hemolysis but patient is cirrhotic  Thrombocytopenia, coagulopathy 2nd to ETOH S/p Vitamin K x 2 R/o dic vs liver dz P:  Transfuse for Hb < 7 or bleeding Repeat Smear No large GI losses noted, FOBT positive 4/15 Repeat fibrinogen, LDH, peripheral smear, retic count obtain factor 8 level  Repeat vit K  If products needed - use cryo  INFECTIOUS A:   Pneumonia. Concern for SBP P:   Day 4  Cefepime, consider improved anaerobic coverage Obtain HIV will [place] abdo Hep panel  ENDOCRINE A:   Hypoglycemia. P:   D5 in IV fluid Monitor CBGs  NEUROLOGIC A:   Acute metabolic encephalopathy- resolved High risk hepatic enceph P:   fent prn pain  Goals of care >> No CPR, need to establish POA, Palliative care consult requested  Updates:   Gust Rung, DO IMTS PGY-3 11/04/2015, 8:32 AM    STAFF NOTE: I, Rory Percy, MD FACP have personally reviewed patient's available data, including medical history, events of note, physical examination and test results as part of my evaluation. I have discussed with resident/NP and other care providers such as pharmacist, RN and RRT. In addition, I personally evaluated patient and elicited key findings of: awake, no distress, coarse bases, pcxr in am , not impressed pna prior, amio maintain for rate control, correct Lytes, K, mag, perform [place] abdo (we will), may need tap for sbp ? Dic? Related, cbc in pm, stool this am not bloody, normal color , making active bleed less likely, heme is a disarray, differential is DIC, liver dz, sepsis indued vs primary hemo.lysis  picture, obtain hiv, hep panel, repeat ldh, per smear, fibrinogen, coags in am , may need heme assessment, it appears his fibribnogen, and maybe coags and plat may be out of proportion to  Liver dz, if needed use cryo if bleeding noted, get factor 8 level, he is now wheezin glsight and apperas sig distended abdo, requires para diag and ther  The patient is critically ill with multiple organ systems failure and requires high complexity decision making for assessment and support, frequent evaluation and titration of therapies, application of advanced monitoring technologies and extensive interpretation of multiple databases.   Critical Care Time devoted to patient care services described in this note is 30 Minutes. This time reflects time of care of this signee: Rory Percy, MD FACP. This critical care time does not reflect procedure time, or teaching time or supervisory time of PA/NP/Med student/Med Resident etc but could  involve care discussion time. Rest per NP/medical resident whose note is outlined above and that I agree with   Mcarthur Rossetti. Tyson Alias, MD, FACP Pgr: [phone] [facility] 11/04/2015 10:08 AM

## 2015-11-04 NOTE — Consult Note (Signed)
Consultation Note Date: 11/04/2015   Patient Name: [name]  DOB: 07/31/1960  MRN: [id number]  Age / Sex: 55 y.o., male  PCP: Esperanza Richters, PA-C Referring Physician: Leslye Peer, MD  Reason for Consultation: Establishing goals of care  55 yo pleasant gentleman PMH ETOH cirrhosis, HTN, OSA (untreated). ED for dyspnea and he required urgent intubation. Found to have severe anemia with hgb 2.7. No signs of bleeding noted by family. Found to have advanced cirrhosis and now complicated with renal failure.   Clinical Assessment/Narrative: I spoke with [name] briefly regarding his severe cirrhosis and expressed concern over his renal function as well. I attempted to explain that options may be limited in such severe organ disease. I am not sure that he understood. He talks of leaving the hospital "maybe tomorrow" and that "I can follow up on anything they want me too." I attempted to explain the importance and why he cannot just go home. Legs are extremely swollen and weak. He admits he was in very poor shape prior to admission for weeks. I do not believe that he understands his poor prognosis at all. I was beginning to discuss this further when we were interrupted by family members ([name] did not seem to want to speak of this with them and changes the subject). His daughter is present and says he has 2 daughters - I did not ask him about HCPOA in front of daughter today.   He will need palliative follow up to further address GOC, prognosis and expectations, as well as HCPOA.   Contacts/Participants in Discussion: Primary Decision Maker: TO BE DETERMINED. Legally would be his daughters.      Code Status/Advance Care Planning: DNR - did not discuss today    Code Status Orders        Start     Ordered   11/04/15 1302  Do not attempt resuscitation (DNR)   Continuous    Question Answer Comment  In the  event of cardiac or respiratory ARREST Do not call a "code blue"   In the event of cardiac or respiratory ARREST Do not perform Intubation, CPR, defibrillation or ACLS   In the event of cardiac or respiratory ARREST Use medication by any route, position, wound care, and other measures to relive pain and suffering. May use oxygen, suction and manual treatment of airway obstruction as needed for comfort.      11/04/15 1301    Code Status History    Date Active Date Inactive Code Status Order ID Comments User Context   10/31/2015  9:43 PM 11/04/2015  1:01 PM Partial Code [id number]  Oretha Milch, MD Inpatient   10/31/2015  8:37 PM 10/31/2015  9:43 PM Full Code [id number]  Leslye Peer, MD Inpatient   11/12/2013 11:17 PM 11/14/2013  3:18 PM Full Code [id number]  Jacquelin Hawking, MD Inpatient    Advance Directive Documentation        Most Recent Value   Type of Advance Directive  Healthcare Power of Attorney   Pre-existing out of facility DNR order (yellow form or pink MOST form)     "MOST" Form in Place?        Symptom Management:   Paracentesis was done today and has greatly relieved his symptoms.   CIWA protocol.   Palliative Prophylaxis:   Bowel Regimen, Delirium Protocol and Frequent Pain Assessment   Psycho-social/Spiritual:  Support System: Adequate Desire for further Chaplaincy support:yes Additional Recommendations: Caregiving  Support/Resources and Education  on Hospice  Prognosis: < 6 months and very likely much less with advanced liver cirrhosis complicated with renal failure.   Discharge Planning: To be determined.    Chief Complaint/ Primary Diagnoses: Present on Admission:  . Renal failure . Thrombocytopenia (HCC) . Shock (HCC) . Anemia . Cirrhosis (HCC) . Acute respiratory failure (HCC) . Protein-calorie malnutrition, severe (HCC)  I have reviewed the medical record, interviewed the patient and family, and examined the patient. The following aspects are  pertinent.  Past Medical History  Diagnosis Date  . Hypertension   . Allergy   . Arthritis   . Cataract   . Heart murmur   . Sleep apnea     Problems with sleeping  . Neuromuscular disorder (HCC)     neuropathy rt lateral calf.  . Anemia 10/31/2015   Social History   Social History  . Marital Status: Single    Spouse Name: N/A  . Number of Children: N/A  . Years of Education: N/A   Social History Main Topics  . Smoking status: Former Smoker    Types: Cigarettes  . Smokeless tobacco: Never Used  . Alcohol Use: 0.0 oz/week    0 Standard drinks or equivalent per week     Comment: . 4-5 drinks glasses of wine at night.(every night)  . Drug Use: No  . Sexual Activity: Yes   Other Topics Concern  . None   Social History Narrative   Family History  Problem Relation Age of Onset  . Atrial fibrillation Mother   . CVA Mother   . Hypertension Mother   . Emphysema Mother    Scheduled Meds: . sodium chloride   Intravenous Once  . sodium chloride   Intravenous Once  . sodium chloride   Intravenous Once  . sodium chloride   Intravenous Once  . sodium chloride   Intravenous Once  . folic acid  1 mg Intravenous Daily  . insulin aspart  0-9 Units Subcutaneous 6 times per day  . pantoprazole (PROTONIX) IV  40 mg Intravenous Q12H  . piperacillin-tazobactam (ZOSYN)  IV  3.375 g Intravenous Q8H  . pneumococcal 23 valent vaccine  0.5 mL Intramuscular Tomorrow-1000  . thiamine IV  100 mg Intravenous Daily   Continuous Infusions: . amiodarone 30 mg/hr (11/04/15 1415)  . dextrose 5 % and 0.9% NaCl 50 mL/hr at 11/04/15 0800   PRN Meds:.dextrose, sodium chloride flush Medications Prior to Admission:  Prior to Admission medications   Medication Sig Start Date End Date Taking? Authorizing Provider  amLODipine (NORVASC) 5 MG tablet Take 5 mg by mouth daily. 08/18/14  Yes Historical Provider, MD  hydrochlorothiazide (HYDRODIURIL) 12.5 MG tablet Take 2 tablets (25 mg total) by mouth  daily. 11/28/14  Yes Edward [name], PA-C  lisinopril (PRINIVIL,ZESTRIL) 10 MG tablet Take 1 tablet (10 mg total) by mouth daily. 11/28/14  Yes Edward [name], PA-C  meloxicam (MOBIC) 15 MG tablet Take 15 mg by mouth daily.   Yes Historical Provider, MD  omeprazole (PRILOSEC) 20 MG capsule Take 20 mg by mouth daily.   Yes Historical Provider, MD  potassium chloride SA (K-DUR,KLOR-CON) 20 MEQ tablet Take 1 tablet (20 mEq total) by mouth once. 11/28/14  Yes Esperanza Richters, PA-C   Allergies  Allergen Reactions  . Lactose Intolerance (Gi)     unknown    Review of Systems  Unable to perform ROS   Physical Exam  Constitutional: He is oriented to person, place, and time. He appears well-developed and well-nourished.  HENT:  Head: Normocephalic and atraumatic.  Cardiovascular: Normal rate.   Respiratory: Effort normal. No accessory muscle usage. No tachypnea. No respiratory distress.  GI: He exhibits distension and ascites.  Neurological: He is alert and oriented to person, place, and time.  Psychiatric: He has a normal mood and affect.    Vital Signs: BP 107/87 mmHg  Pulse 97  Temp(Src) 98 F (36.7 C) (Oral)  Resp 16  Ht 6' (1.829 m)  Wt 116.7 kg (257 lb 4.4 oz)  BMI 34.89 kg/m2  SpO2 100%  SpO2: SpO2: 100 % O2 Device:SpO2: 100 % O2 Flow Rate: .O2 Flow Rate (L/min): 2 L/min  IO: Intake/output summary:  Intake/Output Summary (Last 24 hours) at 11/04/15 1725 Last data filed at 11/04/15 1600  Gross per 24 hour  Intake 3358.17 ml  Output    450 ml  Net 2908.17 ml    LBM: Last BM Date: 11/04/15 Baseline Weight: Weight: 99.1 kg (218 lb 7.6 oz) Most recent weight: Weight: 116.7 kg (257 lb 4.4 oz)      Palliative Assessment/Data:    Additional Data Reviewed:  CBC:    Component Value Date/Time   WBC 9.1 11/04/2015 0623   HGB 7.9* 11/04/2015 0623   HCT 22.4* 11/04/2015 0623   PLT 59* 11/04/2015 0623   MCV 83.6 11/04/2015 0623   NEUTROABS 5.6 11/03/2015 2245   LYMPHSABS  0.7 11/03/2015 2245   MONOABS 1.8* 11/03/2015 2245   EOSABS 0.1 11/03/2015 2245   BASOSABS 0.0 11/03/2015 2245   Comprehensive Metabolic Panel:    Component Value Date/Time   NA 136 11/04/2015 0623   K 3.0* 11/04/2015 0623   CL 108 11/04/2015 0623   CO2 19* 11/04/2015 0623   BUN 38* 11/04/2015 0623   CREATININE 2.52* 11/04/2015 0623   GLUCOSE 125* 11/04/2015 0623   CALCIUM 7.3* 11/04/2015 0623   AST 431* 11/02/2015 0300   ALT 101* 11/02/2015 0300   ALKPHOS 61 11/02/2015 0300   BILITOT 5.1* 11/02/2015 0300   PROT 6.1* 11/02/2015 0300   ALBUMIN 2.1* 11/04/2015 1413     Time In: 1550 Time Out: 1640 Time Total: 50min Greater than 50%  of this time was spent counseling and coordinating care related to the above assessment and plan.  Signed by: Ulice BoldParker, Mcadoo Muzquiz C, NP  Ulice BoldAlicia C Mercedees Convery, NP  11/04/2015, 5:25 PM  Please contact Palliative Medicine Team phone at [phone] for questions and concerns.

## 2015-11-04 NOTE — Progress Notes (Signed)
PCCM Interval Progress Note  Events:  Left IJ CVL with persistent bleeding throughout the night despite holding pressure and application of thrombi pads.  Interventions:  1 horizontal mattress suture placed around catheter insertion site, tied tightly.  New thrombi pad and dressing applied.  Bleeding appeared to have subsided after dressing applied.  RN to continue to monitor.   Rutherford Guysahul Clerence Gubser, [place] - C [facility] Pager: [phone]  or [phone] 11/04/2015, 3:13 AM

## 2015-11-04 NOTE — Progress Notes (Signed)
          Daily Rounding Note  11/04/2015, 11:28 AM  LOS: 4 days   SUBJECTIVE:       Biggest complaint is SOB.  Overall better than at admission.   OBJECTIVE:         Vital signs in last 24 hours:    Temp:  [98.5 F (36.9 C)-99.8 F (37.7 C)] 98.7 F (37.1 C) (04/19 1121) Pulse Rate:  [86-160] 157 (04/19 0900) Resp:  [0-28] 22 (04/19 0900) BP: (91-148)/(58-97) 101/89 mmHg (04/19 0900) SpO2:  [92 %-100 %] 99 % (04/19 0900) Weight:  [116.7 kg (257 lb 4.4 oz)] 116.7 kg (257 lb 4.4 oz) (04/19 0500) Last BM Date: 11/04/15 Filed Weights   11/02/15 0500 11/03/15 0754 11/04/15 0500  Weight: 107 kg (235 lb 14.3 oz) 115.5 kg (254 lb 10.1 oz) 116.7 kg (257 lb 4.4 oz)   General: looks ill   Heart: Irreg, irreg Chest: reduced but clear Abdomen: very tense and tight, protuberant with obvious ascites.  NT.  Umbilical hernia. GU:  Large scrotal and penile edema   Extremities: massive edema/anasarca 3+ pitting.  Neuro/Psych:  Oriented x 3.  Alert.  Appropriate.  Tremor but not asterixis.    Intake/Output from previous day: 04/18 0701 - 04/19 0700 In: 4396 [P.O.:480; I.V.:1468; ZOXWR:6045Blood:2398; IV Piggyback:50] Out: 275 [Urine:275]   Lab Results:  Recent Labs  11/03/15 1040 11/03/15 2245 11/04/15 0623  WBC 8.4 8.1 9.1  HGB 6.4* 5.7* 7.9*  HCT 18.8* 16.8* 22.4*  PLT 38* 63* 59*   BMET  Recent Labs  11/02/15 0300 11/03/15 0442 11/04/15 0623  NA 143 139 136  K 3.5 3.1* 3.0*  CL 112* 108 108  CO2 23 20* 19*  GLUCOSE 133* 125* 125*  BUN 35* 34* 38*  CREATININE 2.13* 2.22* 2.52*  CALCIUM 7.0* 6.9* 7.3*   LFT  Recent Labs  11/02/15 0300  PROT 6.1*  ALBUMIN 1.9*  AST 431*  ALT 101*  ALKPHOS 61  BILITOT 5.1*   PT/INR  Recent Labs  11/03/15 1040 11/04/15 0623  LABPROT 36.0* 28.8*  INR 3.73* 2.77*   Hepatitis Panel  Recent Labs  11/03/15 0442  HEPBSAG Negative  HCVAB 0.1  HEPAIGM Negative  HEPBIGM  Negative     ASSESMENT:   *  Decompensated cirrhosis/Liver failure Coagulopathy.  S/p FFP x 2 yesterday.  Anasarca/ascites:  Massive by exam and by bedside ultrasound per Dr Tyson AliasFeinstein.   *  Sever protein calorie malnutriton.  *  AKI.   oliguric  *  Thrombocytopenia.  S/p FFP x 1 yesterday.    *  Anemia.  S/p 12 PRBCs/    PLAN   *  Dr Tyson AliasFeinstein planning paracentesis once pt receives cryoprecipitate.     Jennye MoccasinSarah Gribbin  11/04/2015, 11:28 AM Pager: [phone]    [facility] Attending   I have taken an interval history, reviewed the chart and examined the patient. I agree with the Advanced Practitioner's note, impression and recommendations.   Also .Marland Kitchen....   Paracentesis = 3 L He feels better Will advance diet Not sure we can turn this around in fact doubt but will see - await goals of care meeting  Iva Booparl E. Gessner, MD, [facility] Center[facility]AC[facility] [phone] (pager) [phone] after 5 PM, weekends and holidays  11/04/2015 7:05 PM

## 2015-11-04 NOTE — Procedures (Signed)
paracecentsis  Pre op dx: large ascites with associated resp failure, r/o sbp Post op: large clear ascites  chloraprep applied to rt lower murphy point Lido 12 cc 1% desmotomy Cathter placed under direct visualization US  Off 3.3 liters clear yellow tolerated Blood loss less 2 cc band aide applied  Mcarthur Rossettianiel J. Tyson AliasFeinstein, MD, FACP Pgr: [phone] [facility]

## 2015-11-05 ENCOUNTER — Inpatient Hospital Stay (HOSPITAL_COMMUNITY): Payer: Federal, State, Local not specified - PPO

## 2015-11-05 ENCOUNTER — Encounter (HOSPITAL_COMMUNITY): Payer: Federal, State, Local not specified - PPO

## 2015-11-05 DIAGNOSIS — Z515 Encounter for palliative care: Secondary | ICD-10-CM | POA: Insufficient documentation

## 2015-11-05 DIAGNOSIS — R600 Localized edema: Secondary | ICD-10-CM

## 2015-11-05 DIAGNOSIS — D649 Anemia, unspecified: Secondary | ICD-10-CM | POA: Insufficient documentation

## 2015-11-05 DIAGNOSIS — K7460 Unspecified cirrhosis of liver: Secondary | ICD-10-CM

## 2015-11-05 LAB — PREPARE RBC (CROSSMATCH)

## 2015-11-05 LAB — PREPARE CRYOPRECIPITATE
UNIT DIVISION: 0
Unit division: 0

## 2015-11-05 LAB — CBC WITH DIFFERENTIAL/PLATELET
BASOS ABS: 0 10*3/uL (ref 0.0–0.1)
Basophils Relative: 0 %
Eosinophils Absolute: 0.3 10*3/uL (ref 0.0–0.7)
Eosinophils Relative: 4 %
HEMATOCRIT: 18 % — AB (ref 39.0–52.0)
HEMOGLOBIN: 6.2 g/dL — AB (ref 13.0–17.0)
LYMPHS ABS: 0.6 10*3/uL — AB (ref 0.7–4.0)
LYMPHS PCT: 8 %
MCH: 28.3 pg (ref 26.0–34.0)
MCHC: 34.4 g/dL (ref 30.0–36.0)
MCV: 82.2 fL (ref 78.0–100.0)
Monocytes Absolute: 2.2 10*3/uL — ABNORMAL HIGH (ref 0.1–1.0)
Monocytes Relative: 29 %
NEUTROS ABS: 4.3 10*3/uL (ref 1.7–7.7)
Neutrophils Relative %: 58 %
Platelets: 55 10*3/uL — ABNORMAL LOW (ref 150–400)
RBC: 2.19 MIL/uL — AB (ref 4.22–5.81)
RDW: 16.2 % — ABNORMAL HIGH (ref 11.5–15.5)
WBC: 7.5 10*3/uL (ref 4.0–10.5)

## 2015-11-05 LAB — CBC
HEMATOCRIT: 20.7 % — AB (ref 39.0–52.0)
HEMOGLOBIN: 7.1 g/dL — AB (ref 13.0–17.0)
MCH: 28.5 pg (ref 26.0–34.0)
MCHC: 34.3 g/dL (ref 30.0–36.0)
MCV: 83.1 fL (ref 78.0–100.0)
Platelets: 62 10*3/uL — ABNORMAL LOW (ref 150–400)
RBC: 2.49 MIL/uL — AB (ref 4.22–5.81)
RDW: 15.9 % — AB (ref 11.5–15.5)
WBC: 8 10*3/uL (ref 4.0–10.5)

## 2015-11-05 LAB — COMPREHENSIVE METABOLIC PANEL
ALBUMIN: 1.9 g/dL — AB (ref 3.5–5.0)
ALT: 73 U/L — ABNORMAL HIGH (ref 17–63)
ANION GAP: 12 (ref 5–15)
AST: 144 U/L — AB (ref 15–41)
Alkaline Phosphatase: 59 U/L (ref 38–126)
BILIRUBIN TOTAL: 6.8 mg/dL — AB (ref 0.3–1.2)
BUN: 37 mg/dL — ABNORMAL HIGH (ref 6–20)
CHLORIDE: 105 mmol/L (ref 101–111)
CO2: 18 mmol/L — ABNORMAL LOW (ref 22–32)
Calcium: 7.4 mg/dL — ABNORMAL LOW (ref 8.9–10.3)
Creatinine, Ser: 2.32 mg/dL — ABNORMAL HIGH (ref 0.61–1.24)
GFR calc Af Amer: 35 mL/min — ABNORMAL LOW (ref >60)
GFR, EST NON AFRICAN AMERICAN: 30 mL/min — AB (ref >60)
Glucose, Bld: 132 mg/dL — ABNORMAL HIGH (ref 65–99)
POTASSIUM: 3 mmol/L — AB (ref 3.5–5.1)
Sodium: 135 mmol/L (ref 135–145)
TOTAL PROTEIN: 6 g/dL — AB (ref 6.5–8.1)

## 2015-11-05 LAB — CULTURE, BLOOD (ROUTINE X 2)
CULTURE: NO GROWTH
Culture: NO GROWTH

## 2015-11-05 LAB — GLUCOSE, CAPILLARY
GLUCOSE-CAPILLARY: 108 mg/dL — AB (ref 65–99)
GLUCOSE-CAPILLARY: 119 mg/dL — AB (ref 65–99)
GLUCOSE-CAPILLARY: 121 mg/dL — AB (ref 65–99)
GLUCOSE-CAPILLARY: 126 mg/dL — AB (ref 65–99)
Glucose-Capillary: 113 mg/dL — ABNORMAL HIGH (ref 65–99)
Glucose-Capillary: 136 mg/dL — ABNORMAL HIGH (ref 65–99)
Glucose-Capillary: 151 mg/dL — ABNORMAL HIGH (ref 65–99)

## 2015-11-05 LAB — HIV ANTIBODY (ROUTINE TESTING W REFLEX): HIV Screen 4th Generation wRfx: NONREACTIVE

## 2015-11-05 LAB — APTT: APTT: 43 s — AB (ref 24–37)

## 2015-11-05 LAB — PATHOLOGIST SMEAR REVIEW

## 2015-11-05 LAB — PROTIME-INR
INR: 2.63 — AB (ref 0.00–1.49)
PROTHROMBIN TIME: 27.8 s — AB (ref 11.6–15.2)

## 2015-11-05 LAB — FACTOR 8 ASSAY: COAGULATION FACTOR VIII: 282 % — AB (ref 57–163)

## 2015-11-05 MED ORDER — VITAMIN B-1 100 MG PO TABS
100.0000 mg | ORAL_TABLET | Freq: Every day | ORAL | Status: DC
  Administered 2015-11-06 – 2015-11-13 (×8): 100 mg via ORAL
  Filled 2015-11-05 (×8): qty 1

## 2015-11-05 MED ORDER — POTASSIUM CHLORIDE 10 MEQ/50ML IV SOLN
10.0000 meq | INTRAVENOUS | Status: AC
  Administered 2015-11-05 (×6): 10 meq via INTRAVENOUS
  Filled 2015-11-05 (×6): qty 50

## 2015-11-05 MED ORDER — SODIUM CHLORIDE 0.9 % IV SOLN: Freq: Once | INTRAVENOUS | Status: DC

## 2015-11-05 MED ORDER — MAGNESIUM SULFATE 2 GM/50ML IV SOLN
2.0000 g | Freq: Once | INTRAVENOUS | Status: AC
  Administered 2015-11-05: 2 g via INTRAVENOUS
  Filled 2015-11-05: qty 50

## 2015-11-05 MED ORDER — FOLIC ACID 1 MG PO TABS
1.0000 mg | ORAL_TABLET | Freq: Every day | ORAL | Status: DC
  Administered 2015-11-06 – 2015-11-13 (×8): 1 mg via ORAL
  Filled 2015-11-05 (×8): qty 1

## 2015-11-05 MED ORDER — FUROSEMIDE 10 MG/ML IJ SOLN
80.0000 mg | Freq: Three times a day (TID) | INTRAMUSCULAR | Status: DC
  Administered 2015-11-05 – 2015-11-06 (×6): 80 mg via INTRAVENOUS
  Filled 2015-11-05 (×7): qty 8

## 2015-11-05 MED ORDER — PANTOPRAZOLE SODIUM 40 MG PO TBEC
40.0000 mg | DELAYED_RELEASE_TABLET | Freq: Every day | ORAL | Status: DC
  Administered 2015-11-06 – 2015-11-13 (×8): 40 mg via ORAL
  Filled 2015-11-05 (×9): qty 1

## 2015-11-05 NOTE — Progress Notes (Signed)
eLink Nursing ICU Electrolyte Replacement Protocol  Patient Name: [name] DOB: 02/04/1961 MRN: [id number]  Date of Service  11/05/2015   HPI/Events of Note    Recent Labs Lab 11/01/15 0420 11/01/15 1742 11/02/15 0300 11/03/15 0442 11/04/15 0623 11/04/15 1805  NA 141 141 143 139 136 136  K 3.3* 3.1* 3.5 3.1* 3.0* 3.0*  CL 111 109 112* 108 108 107  CO2 19* 21* 23 20* 19* 18*  GLUCOSE 191* 149* 133* 125* 125* 141*  BUN 36* 35* 35* 34* 38* 39*  CREATININE 2.24* 2.22* 2.13* 2.22* 2.52* 2.58*  CALCIUM 6.9* 7.0* 7.0* 6.9* 7.3* 7.4*  MG 1.4*  --  1.9 1.6* 1.5* 1.7  PHOS 3.4  --  2.0* 2.0* 2.6  --     Estimated Creatinine Clearance: 43.7 mL/min (by C-G formula based on Cr of 2.58).  Intake/Output      04/19 0701 - 04/20 0700   I.V. (mL/kg) 1669.1 (14.3)   Blood 220   IV Piggyback 450   Total Intake(mL/kg) 2339.1 (20)   Urine (mL/kg/hr) 1450 (0.5)   Stool 0 (0)   Total Output 1450   Net +889.1       Stool Occurrence 1 x    - I/O DETAILED x24h    Total I/O In: 893.7 [I.V.:793.7; IV Piggyback:100] Out: 975 [Urine:975] - I/O THIS SHIFT    ASSESSMENT   eICURN Interventions  K+ 3.0 Replaced using protocol. MD informed.   ASSESSMENT: MAJOR ELECTROLYTE    [name], [name] [name] 11/05/2015, 6:07 AM

## 2015-11-05 NOTE — Progress Notes (Signed)
*  Preliminary Results* Bilateral lower extremity venous duplex completed. Study was very technically difficult due to extensive interstitial fluid and pitting edema. Visualized veins of bilateral lower extremities are negative for deep vein thrombosis. There is no evidence of Baker's cyst bilaterally.   Incidental finding:There is evidence of a heterogenous area of the left groin, suggestive of a possible inguinal lymph node.  11/05/2015 4:20 PM  Gertie FeyMichelle Pantera Winterrowd, RVT, RDCS, RDMS

## 2015-11-05 NOTE — Progress Notes (Signed)
CRITICAL VALUE ALERT  Critical value received:  Hemoglobin 6.2   Date of notification:  11/05/15  Time of notification:  0800  Critical value read back: Yes   Nurse who received alert:  Hazel Samshristian Braya Habermehl RN   MD notified (1st page):  Tyson AliasFeinstein   Time of first page:  MD on unit  MD notified (2nd page):  Time of second page:  Responding MD:  MD Tyson AliasFeinstein   Time MD responded:  1020

## 2015-11-05 NOTE — Evaluation (Signed)
Physical Therapy Evaluation Patient Details Name: [name] MRN: [id number] DOB: 04/29/1961 Today's Date: 11/05/2015   History of Present Illness  pt presents Obtunded with Renal Failure and Shock.  pt with hx of Etoh, HTN, OSA, Cataracts, Etoh Hepatitis, Pancreatitis, Ascites, Hepatomegaly, and Neuropathy.    Clinical Impression  Pt globally weak and with DOE with even minimal exertion.  Pt's HR was A-fib ranging 120s to 160s during session and remained up to 160s simply sitting EOB.  Pt c/o dizziness, but BP unable to read and pt indicated dizziness increasing, so returned to supine.  Pt indicates he lives alone and that his family stops by to check on him, but when asked about increased A he simply states he won't burden them like that.  Pt will need increased A at D/C and would benefit from SNF level of therapy to allow pt to maximize his independence prior to returning to home.      Follow Up Recommendations SNF    Equipment Recommendations  None recommended by PT    Recommendations for Other Services       Precautions / Restrictions Precautions Precautions: Fall Restrictions Weight Bearing Restrictions: No      Mobility  Bed Mobility Overal bed mobility: Needs Assistance;+2 for physical assistance Bed Mobility: Rolling;Supine to Sit;Sit to Supine Rolling: Mod assist   Supine to sit: Mod assist;+2 for physical assistance Sit to supine: Max assist;+2 for physical assistance   General bed mobility comments: A with bringing Bil LEs in/out of bed and A with trunk.  pt with DOE, however pt states he always breathes like this.  pt's HR A-fib up to 160's.    Transfers                    Ambulation/Gait                Stairs            Wheelchair Mobility    Modified Rankin (Stroke Patients Only)       Balance Overall balance assessment: Needs assistance Sitting-balance support: Bilateral upper extremity supported;Feet supported Sitting  balance-Leahy Scale: Poor Sitting balance - Comments: pt needs Bil UE support and intermittent MinA to prevent fall posteriorly.  pt's HR remained in A-fib while sitting EOB ranging 120s to 160s.   Postural control: Posterior lean                                   Pertinent Vitals/Pain Pain Assessment: No/denies pain    Home Living Family/patient expects to be discharged to:: Private residence Living Arrangements: Alone Available Help at Discharge: Family;Friend(s);Available PRN/intermittently           Home Equipment: Walker - 2 wheels;Cane - single point;Crutches;Wheelchair - manual      Prior Function Level of Independence: Needs assistance   Gait / Transfers Assistance Needed: pt indicates he uses his crutches, but also indicates his family/friends have to help him at times.    ADL's / Homemaking Assistance Needed: Unclear how much pt was truly doing for himself.        Hand Dominance        Extremity/Trunk Assessment   Upper Extremity Assessment: Generalized weakness           Lower Extremity Assessment: RLE deficits/detail;LLE deficits/detail RLE Deficits / Details: Generally weak with strength grossly 3/5.  pt very edemnatous.   LLE Deficits / Details:  Generally weak with strength grossly 3/5.  pt very edemnatous.       Communication   Communication: No difficulties  Cognition Arousal/Alertness: Awake/alert Behavior During Therapy: WFL for tasks assessed/performed Overall Cognitive Status: Within Functional Limits for tasks assessed                      General Comments      Exercises        Assessment/Plan    PT Assessment Patient needs continued PT services  PT Diagnosis Difficulty walking;Generalized weakness   PT Problem List Decreased strength;Decreased activity tolerance;Decreased balance;Decreased mobility;Decreased coordination;Decreased knowledge of use of DME;Cardiopulmonary status limiting activity;Impaired  sensation;Decreased skin integrity  PT Treatment Interventions DME instruction;Gait training;Stair training;Functional mobility training;Therapeutic activities;Therapeutic exercise;Balance training;Neuromuscular re-education;Patient/family education   PT Goals (Current goals can be found in the Care Plan section) Acute Rehab PT Goals Patient Stated Goal: Home PT Goal Formulation: With patient Time For Goal Achievement: 11/19/15 Potential to Achieve Goals: Good    Frequency Min 3X/week   Barriers to discharge Decreased caregiver support pt indicates he does not want to burden his family    Co-evaluation               End of Session Equipment Utilized During Treatment: Oxygen Activity Tolerance: Treatment limited secondary to medical complications (Comment) (A-fib and c/o dizziness) Patient left: in bed;with call bell/phone within reach (Bed in chair position) Nurse Communication: Mobility status (HR)         Time: [id number] PT Time Calculation (min) (ACUTE ONLY): 24 min   Charges:   PT Evaluation $PT Eval Moderate Complexity: 1 Procedure PT Treatments $Therapeutic Activity: 8-22 mins   PT G CodesSunny Schlein:        Kenijah Benningfield F, [place] CarolinaPT [phone] 11/05/2015, 11:14 AM

## 2015-11-05 NOTE — Progress Notes (Signed)
Daily Rounding Note  11/05/2015, 11:33 AM  LOS: 5 days   SUBJECTIVE:       eatomg 100% of meals.  Belly feeling better after tap.  Brown stool yesterday evening.  No complaints.  OBJECTIVE:         Vital signs in last 24 hours:    Temp:  [98 F (36.7 C)-98.9 F (37.2 C)] 98.8 F (37.1 C) (04/20 1101) Pulse Rate:  [28-146] 96 (04/20 1100) Resp:  [0-31] 0 (04/20 1100) BP: (86-151)/(58-119) 115/86 mmHg (04/20 0900) SpO2:  [89 %-100 %] 100 % (04/20 1100) Weight:  [116.8 kg (257 lb 8 oz)] 116.8 kg (257 lb 8 oz) (04/20 0500) Last BM Date: 11/04/15 Filed Weights   11/03/15 0754 11/04/15 0500 11/05/15 0500  Weight: 115.5 kg (254 lb 10.1 oz) 116.7 kg (257 lb 4.4 oz) 116.8 kg (257 lb 8 oz)   General: looks ill, sleepy   Heart: RRR Chest: clear bil but decreased bil.   Abdomen: tense, less protuberant.  Not tender.  Leaking of asitic fluid, gauze in place over tap site  Extremities: + 3 to 4 + edema Neuro/Psych:  Oriented x 3.  Somewhat difficult to arouse but stays awake for questions and exam.  Appropriate.  No asterixis.   Intake/Output from previous day: 04/19 0701 - 04/20 0700 In: 2355.8 [I.V.:1685.8; Blood:220; IV Piggyback:450] Out: 1450 [Urine:1450]   Lab Results:  Recent Labs  11/04/15 0623 11/04/15 1805 11/05/15 0745  WBC 9.1 10.1 7.5  HGB 7.9* 7.2* 6.2*  HCT 22.4* 20.2* 18.0*  PLT 59* 66* 55*   BMET  Recent Labs  11/04/15 0623 11/04/15 1805 11/05/15 0552  NA 136 136 135  K 3.0* 3.0* 3.0*  CL 108 107 105  CO2 19* 18* 18*  GLUCOSE 125* 141* 132*  BUN 38* 39* 37*  CREATININE 2.52* 2.58* 2.32*  CALCIUM 7.3* 7.4* 7.4*   LFT  Recent Labs  11/04/15 1413 11/05/15 0552  PROT  --  6.0*  ALBUMIN 2.1* 1.9*  AST  --  144*  ALT  --  73*  ALKPHOS  --  59  BILITOT  --  6.8*   PT/INR  Recent Labs  11/04/15 0623 11/05/15 0552  LABPROT 28.8* 27.8*  INR 2.77* 2.63*   Hepatitis  Panel  Ascites Fluid  Ref. Range 11/04/2015 13:05  Monocyte-Macrophage-Serous Fluid Latest Ref Range: 50-90 % 12 (L)  Other Cells, Fluid Latest Units: % SOME MESOTHELIAL .Marland Kitchen.Marland Kitchen.  Albumin, Fluid Latest Units: g/dL <1.6<1.0  Fluid Type-FALB Unknown PERITONEAL CAVITY  Fluid Type-FLDH Unknown PERITONEAL CAVITY  LD, Fluid Latest Ref Range: 3-23 U/L 57 (H)  Fluid Type-FCT Unknown PERITONEAL CAVITY  Color, Fluid Latest Ref Range: YELLOW  YELLOW  WBC, Fluid Latest Ref Range: 0-1000 cu mm 5  Lymphs, Fluid Latest Units: % 82  Appearance, Fluid Latest Ref Range: CLEAR  HAZY (A)  Neutrophil Count, Fluid Latest Ref Range: 0-25 % 6   Studies/Results: Dg Chest Port 1 View  11/05/2015  CLINICAL DATA:  Dyspnea EXAM: PORTABLE CHEST 1 VIEW COMPARISON:  11/02/2015 FINDINGS: Left internal jugular central line again identified. Tip obscured by overlying EKG leads. Heart size and vascular pattern normal. Low lung volumes. Bilateral lower lobe opacities right worse than left. There has been mild improvement in bilateral lung base aeration. IMPRESSION: Persistent but improved bibasilar opacities. Electronically Signed   By: Esperanza Heiraymond  Rubner M.D.   On: 11/05/2015 07:11    ASSESMENT:   *  Decompensated cirrhosis/Liver failure. ETOH hepatitis.  Hep ABC serologies negative.  Transaminases improved, t bili worse.  Coagulopathy. S/p FFP, cryoprecipitate.  Anasarca/ascites: Massive.  S/p 3 liter tap 4/19.  No SBP.  Still leaking from tap site Na levels ok.   * Severe protein calorie malnutriton.  * AKI. Oliguria improved.  Output 1.4 liters yesterday.   *  Hypokalemia.   * Thrombocytopenia. S/p FFP x 1 yesterday. coags minimally improved.    * Anemia. S/p 12 PRBCs/  .  FOBT +. Hgb drop another 1gram over last 24 hours.   *  Shock due to severe anemia.   *  bil lung opacities. Improving  *  Afib vs SVT.     PLAN   *  Pal care, goals of care discussion in works.  *  If ascites continues to leak, can  place ostomy apparatus to collect fluid.  *  Blood products per CCM.      Jennye Moccasin  11/05/2015, 11:33 AM Pager: [phone]    [facility] Attending   I have taken an interval history, reviewed the chart and examined the patient. I agree with the Advanced Practitioner's note, impression and recommendations.   Overall prognosis remains very poor but the excellent care he has received has ghelped and he is improved and about to leave ICU.  Will f/u tomorrow  Consider endoscopic evaluation as he improves more - if he does but no good hx for sig GI bleed and I would think at least some bone marrow suppression.  Iva Boop, MD, [facility] [phone] (pager) [phone] after 5 PM, weekends and holidays  11/05/2015 5:04 PM

## 2015-11-05 NOTE — Progress Notes (Signed)
PULMONARY / CRITICAL CARE MEDICINE   Name: [name] MRN: [id number] DOB: 08/18/1960    ADMISSION DATE:  10/31/2015  CHIEF COMPLAINT:  Obtundation   55 yo male former smoker with alcoholic cirrhosis presents with shock, AKI with severe anemia >> likely acute on chronic blood loss.  PMHx of HTN, Lt leg neuropathy.  STUDIES:  4/15 CT head >> chronic small vessel ischemic changes 4/15 Abd u/s >> cirrhosis, ascites  CULTURES: Blood 4/15 >> ng Urine 4/15 >> ng  ANTIBIOTICS: Cefepime 4/15 >>  vanco 4/15 >>4/17   SIGNIFICANT EVENTS 4/15 Admit, multiple units of PRBC/FFP; limited code established 4/16 SVT >> cardioversion/start amiodarone; transfuse 2 units PRBC; off pressors 4/17 extubated 4/18 Hgb continues to drop 4/19- paracentesis for resp failure, 3.3 liter yellow  SUBJECTIVE:  Hb stable, patient feels his breathing has improved after paracentesis  VITAL SIGNS: BP 110/73 mmHg  Pulse 89  Temp(Src) 98.2 F (36.8 C) (Oral)  Resp 23  Ht 6' (1.829 m)  Wt 257 lb 8 oz (116.8 kg)  BMI 34.92 kg/m2  SpO2 100%  HEMODYNAMICS: CVP:  [14 mmHg-18 mmHg] 14 mmHg  VENTILATOR SETTINGS:    INTAKE / OUTPUT: I/O last 3 completed shifts: In: 4112 [I.V.:2437; Blood:1225; IV Piggyback:450] Out: 1725 [Urine:1725]  PHYSICAL EXAMINATION: General: chr ill appearing Neuro: alert, interactive,follows simple commands HEENT:pale, no JVD, icterus + Cardiac: regular, no murmur Chest: b/l basilar crackles Abd: soft, distended, non tender, bandage to RLQ damp Ext: 3+ pitting edema bilaterally Skin: chronic venous stasis changes, small amount of blood at left IJ bandage, improved from yesterday (bandage was changed   LABS:  BMET  Recent Labs Lab 11/03/15 0442 11/04/15 0623 11/04/15 1805  NA 139 136 136  K 3.1* 3.0* 3.0*  CL 108 108 107  CO2 20* 19* 18*  BUN 34* 38* 39*  CREATININE 2.22* 2.52* 2.58*  GLUCOSE 125* 125* 141*    Electrolytes  Recent Labs Lab  11/02/15 0300 11/03/15 0442 11/04/15 0623 11/04/15 1805  CALCIUM 7.0* 6.9* 7.3* 7.4*  MG 1.9 1.6* 1.5* 1.7  PHOS 2.0* 2.0* 2.6  --     CBC  Recent Labs Lab 11/03/15 2245 11/04/15 0623 11/04/15 1805  WBC 8.1 9.1 10.1  HGB 5.7* 7.9* 7.2*  HCT 16.8* 22.4* 20.2*  PLT 63* 59* 66*    Coag's  Recent Labs Lab 11/01/15 0917  11/03/15 1040 11/04/15 0623 11/05/15 0552  APTT 42*  --  46*  --  43*  INR 3.10*  < > 3.73* 2.77* 2.63*  < > = values in this interval not displayed.  Sepsis Markers  Recent Labs Lab 10/31/15 2045 10/31/15 2237 11/01/15 0843 11/01/15 1229  LATICACIDVEN  --  7.6* 2.8* 2.1*  PROCALCITON 0.58  --   --   --     ABG  Recent Labs Lab 11/01/15 0425 11/01/15 1741 11/02/15 0534  PHART 7.442 7.476* 7.467*  PCO2ART 29.9* 32.8* 31.6*  PO2ART 184* 165.0* 168*    Liver Enzymes  Recent Labs Lab 10/31/15 2045 11/01/15 0420 11/02/15 0300 11/04/15 1413  AST 364* 500* 431*  --   ALT 72* 93* 101*  --   ALKPHOS 74 60 61  --   BILITOT 5.0* 4.8* 5.1*  --   ALBUMIN 1.8* 1.6* 1.9* 2.1*    Cardiac Enzymes  Recent Labs Lab 11/01/15 1742 11/02/15 0300 11/02/15 0850  TROPONINI 0.05* 0.05* 0.04*    Glucose  Recent Labs Lab 11/04/15 0725 11/04/15 1110 11/04/15 1507 11/04/15 2002 11/04/15 2349  11/05/15 0308  GLUCAP 114* 147* 159* 137* 136* 113*    Imaging Dg Chest Port 1 View  11/05/2015  CLINICAL DATA:  Dyspnea EXAM: PORTABLE CHEST 1 VIEW COMPARISON:  11/02/2015 FINDINGS: Left internal jugular central line again identified. Tip obscured by overlying EKG leads. Heart size and vascular pattern normal. Low lung volumes. Bilateral lower lobe opacities right worse than left. There has been mild improvement in bilateral lung base aeration. IMPRESSION: Persistent but improved bibasilar opacities. Electronically Signed   By: Esperanza Heir M.D.   On: 11/05/2015 07:11    Procedures: 4/19: Paracentesis 3.3 L removed  LINES/TUBES: ETT 4/15  >> 4/17 Lt IJ CVL 4/16 >> Rt radial aline 4/16 >>4/17  DISCUSSION:  Unclear cause of severe anemia - low haptoglobin raises concern for hemolysis  ASSESSMENT / PLAN:  PULMONARY A: Acute respiratory failure in setting of shock, severe acidosis, anemia, and pneumonia. pulm edema P:   Resolved failure with lasix and para Keep neg IS  CARDIOVASCULAR A:  Hypovolemic shock 2nd to severe anemia. Hx of HTN. SVT s/p cardioversion 4/16. Echo EF 50% Tachycardia SVT vs A fib P:  Amiodarone drip maintenance remain IV Keep k greater 4, mag greater 2 Replacing potassium and mag Treated resp failure which was driving svt  RENAL A:   AKI >> baseline creatinine 0.72 from 11/28/14. FeNa <1 s/p pre-renal, ? hepato renal Acute metabolic acidosis, lactic acidosis >> improved. Hypokalemia Hypomagnesemia P:   -Monitor, no sign of uremia -lasix maintain, increase freq  GASTROINTESTINAL A:   ETOH cirrhosis with ascites. Presumed chronic GI bleed, NOT noted clinically P:   NPO Protonix Thiamine, folic acid  GI following, EGD deferred  HEMATOLOGIC A:   Severe anemia >> likely acute on chronic blood loss, retic inappropriately low, low haptoglobin raises concern for hemolysis but patient is cirrhotic  Thrombocytopenia, coagulopathy 2nd to ETOH S/p Vitamin K x 3 R/o dic vs liver dz P:  Transfuse for Hb < 7 or bleeding No large GI losses noted, FOBT positive 4/15  fibrinogen- 119, LDH -353, retic count 3.6%, peripheral smear obtain factor 8 level- pending If products needed - use cryo  INFECTIOUS A:   Pneumonia. Concern for SBP>> no evidence on paracentesis with WBC of 5 P:   Zosyn- dc HIV- neg Hep panel- neg Consider rechecking procalc>> 0.58 on admission  ENDOCRINE A:   Hypoglycemia. P:   Monitor CBGs  NEUROLOGIC A:   Acute metabolic encephalopathy- resolved High risk hepatic enceph P:   fent prn pain PT active  Goals of care >> No CPR, need to establish POA,  Palliative care consult requested  Updates:   Gust Rung, DO IMTS PGY-3 11/05/2015, 7:14 AM   STAFF NOTE: I, Rory Percy, MD FACP have personally reviewed patient's available data, including medical history, events of note, physical examination and test results as part of my evaluation. I have discussed with resident/NP and other care providers such as pharmacist, RN and RRT. In addition, I personally evaluated patient and elicited key findings of: alert, no distress today after lasix and paracentesis, IS addition, neg with lasix needed, renal fxn did well with lasix, maintain a ndn increase to tid, presume some low grade bleed but not evident clinically now, could have hematologic process, fibrinogen improved, await factor 8 etc, cbc to follow in pm, may need heme assessment, await above, dc zosyn, sbp neg on tap, keep IV amio maintenance for now, to sdu, traid  Mcarthur Rossetti. Tyson Alias, MD, FACP Pgr: [phone]  [facility] 11/05/2015 10:15 AM

## 2015-11-05 NOTE — Care Management Note (Signed)
Case Management Note  Patient Details  Name: [name] MRN: [id number] Date of Birth: 11/29/1960  Subjective/Objective:                11-05-15 S/W [facility] @ [employer] : CONTACT FOR ANY D/C PLANNING NEEDS.PHONE # [phone]      Action/Plan:   Expected Discharge Date:                  Expected Discharge Plan:  Home/Self Care  In-House Referral:     Discharge planning Services  CM Consult  Post Acute Care Choice:    Choice offered to:     DME Arranged:    DME Agency:     HH Arranged:    HH Agency:     Status of Service:  In process, will continue to follow  Medicare Important Message Given:    Date Medicare IM Given:    Medicare IM give by:    Date Additional Medicare IM Given:    Additional Medicare Important Message give by:     If discussed at Long Length of Stay Meetings, dates discussed:    Additional Comments:  Vangie BickerBrown, Roch Quach Jane, RN 11/05/2015, 3:18 PM

## 2015-11-06 ENCOUNTER — Inpatient Hospital Stay (HOSPITAL_COMMUNITY): Payer: Federal, State, Local not specified - PPO

## 2015-11-06 DIAGNOSIS — R58 Hemorrhage, not elsewhere classified: Secondary | ICD-10-CM | POA: Insufficient documentation

## 2015-11-06 DIAGNOSIS — E876 Hypokalemia: Secondary | ICD-10-CM

## 2015-11-06 DIAGNOSIS — R401 Stupor: Secondary | ICD-10-CM

## 2015-11-06 DIAGNOSIS — K7469 Other cirrhosis of liver: Secondary | ICD-10-CM

## 2015-11-06 LAB — CBC
HCT: 20 % — ABNORMAL LOW (ref 39.0–52.0)
Hemoglobin: 6.9 g/dL — CL (ref 13.0–17.0)
MCH: 28.3 pg (ref 26.0–34.0)
MCHC: 34.5 g/dL (ref 30.0–36.0)
MCV: 82 fL (ref 78.0–100.0)
PLATELETS: 67 10*3/uL — AB (ref 150–400)
RBC: 2.44 MIL/uL — AB (ref 4.22–5.81)
RDW: 16 % — AB (ref 11.5–15.5)
WBC: 7.9 10*3/uL (ref 4.0–10.5)

## 2015-11-06 LAB — COMPREHENSIVE METABOLIC PANEL
ALBUMIN: 1.9 g/dL — AB (ref 3.5–5.0)
ALT: 69 U/L — AB (ref 17–63)
AST: 109 U/L — AB (ref 15–41)
Alkaline Phosphatase: 66 U/L (ref 38–126)
Anion gap: 9 (ref 5–15)
BUN: 36 mg/dL — AB (ref 6–20)
CHLORIDE: 106 mmol/L (ref 101–111)
CO2: 20 mmol/L — AB (ref 22–32)
Calcium: 7.8 mg/dL — ABNORMAL LOW (ref 8.9–10.3)
Creatinine, Ser: 2.13 mg/dL — ABNORMAL HIGH (ref 0.61–1.24)
GFR calc Af Amer: 39 mL/min — ABNORMAL LOW (ref >60)
GFR, EST NON AFRICAN AMERICAN: 34 mL/min — AB (ref >60)
Glucose, Bld: 112 mg/dL — ABNORMAL HIGH (ref 65–99)
POTASSIUM: 2.9 mmol/L — AB (ref 3.5–5.1)
SODIUM: 135 mmol/L (ref 135–145)
Total Bilirubin: 8 mg/dL — ABNORMAL HIGH (ref 0.3–1.2)
Total Protein: 6 g/dL — ABNORMAL LOW (ref 6.5–8.1)

## 2015-11-06 LAB — GLUCOSE, CAPILLARY
GLUCOSE-CAPILLARY: 102 mg/dL — AB (ref 65–99)
GLUCOSE-CAPILLARY: 103 mg/dL — AB (ref 65–99)
Glucose-Capillary: 104 mg/dL — ABNORMAL HIGH (ref 65–99)
Glucose-Capillary: 110 mg/dL — ABNORMAL HIGH (ref 65–99)
Glucose-Capillary: 128 mg/dL — ABNORMAL HIGH (ref 65–99)

## 2015-11-06 LAB — PREPARE RBC (CROSSMATCH)

## 2015-11-06 LAB — BILIRUBIN, DIRECT: Bilirubin, Direct: 2.9 mg/dL — ABNORMAL HIGH (ref 0.1–0.5)

## 2015-11-06 LAB — DIRECT ANTIGLOBULIN TEST (NOT AT ARMC)
DAT, IGG: NEGATIVE
DAT, complement: NEGATIVE

## 2015-11-06 LAB — HEMOGLOBIN AND HEMATOCRIT, BLOOD
HEMATOCRIT: 21.5 % — AB (ref 39.0–52.0)
Hemoglobin: 7.5 g/dL — ABNORMAL LOW (ref 13.0–17.0)

## 2015-11-06 LAB — BILIRUBIN, TOTAL: BILIRUBIN TOTAL: 8.8 mg/dL — AB (ref 0.3–1.2)

## 2015-11-06 MED ORDER — POTASSIUM CHLORIDE 20 MEQ PO PACK
40.0000 meq | PACK | Freq: Two times a day (BID) | ORAL | Status: DC
  Filled 2015-11-06: qty 2

## 2015-11-06 MED ORDER — LORATADINE 10 MG PO TABS: 10.0000 mg | ORAL_TABLET | Freq: Every day | ORAL | Status: DC

## 2015-11-06 MED ORDER — SODIUM CHLORIDE 0.9% FLUSH
10.0000 mL | INTRAVENOUS | Status: DC | PRN
  Administered 2015-11-06: 30 mL
  Administered 2015-11-07: 10 mL
  Administered 2015-11-07: 30 mL
  Administered 2015-11-07 – 2015-11-10 (×4): 10 mL
  Administered 2015-11-11: 30 mL
  Administered 2015-11-12: 10 mL
  Administered 2015-11-13: 20 mL
  Filled 2015-11-06 (×10): qty 40

## 2015-11-06 MED ORDER — AZELASTINE HCL 0.1 % NA SOLN
2.0000 | Freq: Two times a day (BID) | NASAL | Status: DC
Start: 2015-11-06 — End: 2015-11-13
  Filled 2015-11-06 (×2): qty 30

## 2015-11-06 MED ORDER — POTASSIUM CHLORIDE CRYS ER 20 MEQ PO TBCR
40.0000 meq | EXTENDED_RELEASE_TABLET | ORAL | Status: DC
  Filled 2015-11-06 (×2): qty 2

## 2015-11-06 MED ORDER — POTASSIUM CHLORIDE 10 MEQ/100ML IV SOLN: 10.0000 meq | INTRAVENOUS | Status: DC

## 2015-11-06 MED ORDER — SODIUM CHLORIDE 0.9 % IV SOLN
Freq: Once | INTRAVENOUS | Status: AC
  Administered 2015-11-06: 06:00:00 via INTRAVENOUS

## 2015-11-06 MED ORDER — SODIUM CHLORIDE 0.9 % IV SOLN: Freq: Once | INTRAVENOUS | Status: DC

## 2015-11-06 MED ORDER — POTASSIUM CHLORIDE 10 MEQ/50ML IV SOLN
10.0000 meq | INTRAVENOUS | Status: AC
  Administered 2015-11-06 (×4): 10 meq via INTRAVENOUS
  Filled 2015-11-06 (×4): qty 50

## 2015-11-06 MED ORDER — AMIODARONE HCL 200 MG PO TABS
200.0000 mg | ORAL_TABLET | Freq: Every day | ORAL | Status: DC
  Administered 2015-11-06 – 2015-11-13 (×8): 200 mg via ORAL
  Filled 2015-11-06 (×8): qty 1

## 2015-11-06 MED ORDER — POTASSIUM CHLORIDE CRYS ER 20 MEQ PO TBCR
40.0000 meq | EXTENDED_RELEASE_TABLET | Freq: Once | ORAL | Status: AC
  Administered 2015-11-06: 40 meq via ORAL

## 2015-11-06 MED ORDER — ALBUTEROL SULFATE (2.5 MG/3ML) 0.083% IN NEBU
2.5000 mg | INHALATION_SOLUTION | RESPIRATORY_TRACT | Status: DC | PRN
  Administered 2015-11-07: 2.5 mg via RESPIRATORY_TRACT
  Filled 2015-11-06: qty 3

## 2015-11-06 MED ORDER — POTASSIUM CHLORIDE CRYS ER 20 MEQ PO TBCR
40.0000 meq | EXTENDED_RELEASE_TABLET | Freq: Once | ORAL | Status: AC
  Administered 2015-11-06: 40 meq via ORAL
  Filled 2015-11-06: qty 2

## 2015-11-06 NOTE — Progress Notes (Signed)
PROGRESS NOTE                                                                                                                                                                                                             Patient Demographics:    [name], is a 55 y.o. male, DOB - [birth year], ZOX:[id number]  Admit date - 10/31/2015   Admitting Physician Leslye Peer, MD  Outpatient Primary MD for the patient is [name], [name]  LOS - 6  Outpatient Specialists: None  Chief Complaint  Patient presents with  . Shortness of Breath       Brief Narrative   55 year old male with history of alcoholic cirrhosis, hypertension, OSA (not on CPAP) who was brought to the ED for dyspnea and quickly became obtunded. He was urgently intubated in the ED. Postintubation became hypotensive and was started on norepinephrine. Labs showed severe anemia with hemoglobin of 2.7 and some cytopenia. Was transferred with several units of PRBC and FFP. No source of bleeding noted. He was found to have severely elevated lactic acid of 14 with acute kidney injury and elevated INR of 4.35. He was started on empiric vancomycin and cefepime and then switch to Zosyn. Patient also developed SVT on 4/16 and was started on amiodarone drip. Pressures were discontinued and patient extubated on 4/17. On 4/19 patient had therapeutic paracentesis with 3.3 L yellow fluid removed for increasing ascites and respiratory distress. Transferred to stepdown under hospitalist service on 4/21. GI and oncology following.    Subjective:    complains of abdominal distention with some dyspnea and nasal congestion   Assessment  & Plan :   Principal problem Hypovolemic shock Possibly due to severe anemia. Received several units of PRBC this hospitalization. Anemia thought to be acute on chronic with possible chronic GI bleed. Continue Protonix. GI following. Hold off  on EGD at this time. Transfuse as needed. Receiving another unit PRBC today. SBP negative .    Active Problems: Acute hypoxic respiratory failure with shock Secondary to hypovolemic shock, metabolic acidosis and possible pneumonia and pulmonary edema. Significant positive balance with pulmonary edema. Started on aggressive IV Lasix. Monitor strict I/O. Maintaining O2 sat on 2 L by nasal cannula. Added nebs. Continue bedside spirometry. PC CM plan on repeat therapeutic paracentesis today.  Sinus tachycardia with SVT versus A.  fib Required amiodarone drip in the ICU. Now with better control. Switched to by mouth. K >4 and mag >2. Next line 2-D echo shows EF of 50-50% without wall motion abnormality.  Acute kidney injury Possibly due to hypovolemic shock, now with cirrhosis and pulmonary edema. Getting aggressive diuresis with Lasix. Renal function slowly improving. Monitor daily.   Severe Anemia and thrombocytopenia ? DIC with cirrhosis. Patient also had elevated LDH and fibrinogen. Hematology consulted who recommended severe anemia and some cytopenia is contributed by bone marrow suppression as well. Elevated LDH and haptoglobin difficult to interpret in a cirrhotic patient. Recommend to continue supportive care. Platelets slowly improving.  [facility] following. Suspect component of decompensated alcohol/sepsis cirrhosis. Hemoccult-positive persists suspect this is chronic GI bleed.  Decompensated alcohol excess hep C cirrhosis with anasarca and acute encephalopathy Therapeutic paracentesis done on 4/17 with 3.3 L removed and placed on aggressive IV Lasix. Still has significant ascites and plan for repeat paracentesis today. Encephalopathy resolved.   Shock Likely hypovolemic with? DIC. No signs of SBP. Cultures negative. Concern for pneumonia on admission and has received empiric antibiotics. Lactic acid resolved.  Hypoglycemia Possibly due to shock and poor by mouth intake.  Resolved.  Hypokalemia Replenish. Recheck in a.m. including magnesium.      Code Status : DO NOT RESUSCITATE  Family Communication  : None at bedside  Disposition Plan  : PT recommend skilled nursing facility. Possibly some time next week if continues to improve  Barriers For Discharge : Ongoing anasarca, respiratory failure and ascites, persistent anemia requiring transfusion  Consults  :   PC CM [facility]  Procedures  :  Intubation 2-D echo CT head, ultrasound abdomen Therapeutic paracentesis  DVT Prophylaxis  :  SCD's  Lab Results  Component Value Date   PLT 67* 11/06/2015    Antibiotics  :  Completed  Anti-infectives    Start     Dose/Rate Route Frequency Ordered Stop   11/04/15 1400  piperacillin-tazobactam (ZOSYN) IVPB 3.375 g  Status:  Discontinued     3.375 g 12.5 mL/hr over 240 Minutes Intravenous Every 8 hours 11/04/15 1059 11/05/15 1021   11/04/15 1130  metroNIDAZOLE (FLAGYL) IVPB 500 mg  Status:  Discontinued     500 mg 100 mL/hr over 60 Minutes Intravenous Every 8 hours 11/04/15 1032 11/04/15 1059   11/01/15 1600  ceFEPIme (MAXIPIME) 2 g in dextrose 5 % 50 mL IVPB  Status:  Discontinued     2 g 100 mL/hr over 30 Minutes Intravenous Every 24 hours 10/31/15 1715 11/04/15 1059   11/01/15 0400  vancomycin (VANCOCIN) IVPB 750 mg/150 ml premix  Status:  Discontinued     750 mg 150 mL/hr over 60 Minutes Intravenous Every 12 hours 10/31/15 1715 11/02/15 1206   10/31/15 1600  ceFEPIme (MAXIPIME) 2 g in dextrose 5 % 50 mL IVPB     2 g 100 mL/hr over 30 Minutes Intravenous  Once 10/31/15 1521 10/31/15 1650   10/31/15 1600  vancomycin (VANCOCIN) 1,750 mg in sodium chloride 0.9 % 500 mL IVPB     1,750 mg 250 mL/hr over 120 Minutes Intravenous NOW 10/31/15 1521 10/31/15 1814        Objective:   Filed Vitals:   11/06/15 1100 11/06/15 1200 11/06/15 1300 11/06/15 1400  BP: 119/84 115/84 125/78 132/92  Pulse: 94 100 99 104  Temp:      TempSrc:      Resp:  11 8 12 16   Height:  Weight:      SpO2: 97% 100% 100% 100%    Wt Readings from Last 3 Encounters:  11/06/15 116.7 kg (257 lb 4.4 oz)  11/28/14 89.268 kg (196 lb 12.8 oz)  11/14/14 90.901 kg (200 lb 6.4 oz)     Intake/Output Summary (Last 24 hours) at 11/06/15 1431 Last data filed at 11/06/15 1300  Gross per 24 hour  Intake 1167.9 ml  Output   3800 ml  Net -2632.1 ml     Physical Exam  Gen: Middle aged male appears fatigued, not in distress HEENT: Pallor present, no icterus,, moist mucosa, supple neck, left central line Chest: Diminished bilateral breath sounds, bilateral wheezing CVS: N S1&S2, no murmurs, rubs or gallop GI: Distended with ascites, nontender, Musculoskeletal: warm, 1+ pitting edema bilaterally, scrotal edema, Foley in place CNS: Alert and oriented, no tremors    Data Review:    CBC  Recent Labs Lab 10/31/15 1505  11/03/15 2245 11/04/15 0623 11/04/15 1805 11/05/15 0745 11/05/15 1719 11/06/15 0515 11/06/15 1109  WBC 7.2  < > 8.1 9.1 10.1 7.5 8.0 7.9  --   HGB 2.7*  < > 5.7* 7.9* 7.2* 6.2* 7.1* 6.9* 7.5*  HCT 8.9*  < > 16.8* 22.4* 20.2* 18.0* 20.7* 20.0* 21.5*  PLT 47*  < > 63* 59* 66* 55* 62* 67*  --   MCV 84.8  < > 84.0 83.6 83.5 82.2 83.1 82.0  --   MCH 25.7*  < > 28.5 29.5 29.8 28.3 28.5 28.3  --   MCHC 30.3  < > 33.9 35.3 35.6 34.4 34.3 34.5  --   RDW 26.7*  < > 16.9* 15.6* 16.2* 16.2* 15.9* 16.0*  --   LYMPHSABS 0.5*  --  0.7  --  0.8 0.6*  --   --   --   MONOABS 0.6  --  1.8*  --  3.1* 2.2*  --   --   --   EOSABS 0.0  --  0.1  --  0.2 0.3  --   --   --   BASOSABS 0.0  --  0.0  --  0.0 0.0  --   --   --   < > = values in this interval not displayed.  Chemistries   Recent Labs Lab 10/31/15 2045 11/01/15 0420  11/02/15 0300 11/03/15 0442 11/04/15 0623 11/04/15 1805 11/05/15 0552 11/06/15 0515  NA 136 141  < > 143 139 136 136 135 135  K 3.9 3.3*  < > 3.5 3.1* 3.0* 3.0* 3.0* 2.9*  CL 107 111  < > 112* 108 108 107 105 106   CO2 11* 19*  < > 23 20* 19* 18* 18* 20*  GLUCOSE 148* 191*  < > 133* 125* 125* 141* 132* 112*  BUN 36* 36*  < > 35* 34* 38* 39* 37* 36*  CREATININE 2.41* 2.24*  < > 2.13* 2.22* 2.52* 2.58* 2.32* 2.13*  CALCIUM 7.1* 6.9*  < > 7.0* 6.9* 7.3* 7.4* 7.4* 7.8*  MG  --  1.4*  --  1.9 1.6* 1.5* 1.7  --   --   AST 364* 500*  --  431*  --   --   --  144* 109*  ALT 72* 93*  --  101*  --   --   --  73* 69*  ALKPHOS 74 60  --  61  --   --   --  59 66  BILITOT 5.0* 4.8*  --  5.1*  --   --   --  6.8* 8.0*  < > = values in this interval not displayed. ------------------------------------------------------------------------------------------------------------------ No results for input(s): CHOL, HDL, LDLCALC, TRIG, CHOLHDL, LDLDIRECT in the last 72 hours.  No results found for: HGBA1C ------------------------------------------------------------------------------------------------------------------ No results for input(s): TSH, T4TOTAL, T3FREE, THYROIDAB in the last 72 hours.  Invalid input(s): FREET3 ------------------------------------------------------------------------------------------------------------------  Recent Labs  11/04/15 1111  RETICCTPCT 3.6*    Coagulation profile  Recent Labs Lab 11/01/15 0917 11/02/15 0300 11/03/15 1040 11/04/15 0623 11/05/15 0552  INR 3.10* 3.24* 3.73* 2.77* 2.63*    No results for input(s): DDIMER in the last 72 hours.  Cardiac Enzymes  Recent Labs Lab 11/01/15 1742 11/02/15 0300 11/02/15 0850  TROPONINI 0.05* 0.05* 0.04*   ------------------------------------------------------------------------------------------------------------------    Component Value Date/Time   BNP 694.4* 10/31/2015 1911    Inpatient Medications  Scheduled Meds: . sodium chloride   Intravenous Once  . sodium chloride   Intravenous Once  . sodium chloride   Intravenous Once  . sodium chloride   Intravenous Once  . sodium chloride   Intravenous Once  . sodium  chloride   Intravenous Once  . sodium chloride   Intravenous Once  . amiodarone  200 mg Oral Daily  . azelastine  2 spray Each Nare BID  . folic acid  1 mg Oral Daily  . furosemide  80 mg Intravenous TID  . insulin aspart  0-9 Units Subcutaneous 6 times per day  . pantoprazole  40 mg Oral Daily  . pneumococcal 23 valent vaccine  0.5 mL Intramuscular Tomorrow-1000  . potassium chloride SA  40 mEq Oral Once  . thiamine  100 mg Oral Daily   Continuous Infusions:  PRN Meds:.albuterol, dextrose, sodium chloride flush  Micro Results Recent Results (from the past 240 hour(s))  Blood culture (routine x 2)     Status: None   Collection Time: 10/31/15  3:30 PM  Result Value Ref Range Status   Specimen Description BLOOD RIGHT HAND  Final   Special Requests IN PEDIATRIC BOTTLE  Final   Culture NO GROWTH 5 DAYS  Final   Report Status 11/05/2015 FINAL  Final  Blood culture (routine x 2)     Status: None   Collection Time: 10/31/15  3:56 PM  Result Value Ref Range Status   Specimen Description BLOOD RIGHT HAND  Final   Special Requests BOTTLES DRAWN AEROBIC AND ANAEROBIC 5CC  Final   Culture NO GROWTH 5 DAYS  Final   Report Status 11/05/2015 FINAL  Final  Urine culture     Status: None   Collection Time: 10/31/15  7:14 PM  Result Value Ref Range Status   Specimen Description URINE, CATHETERIZED  Final   Special Requests NONE  Final   Culture NO GROWTH 2 DAYS  Final   Report Status 11/02/2015 FINAL  Final  MRSA PCR Screening     Status: None   Collection Time: 10/31/15  8:45 PM  Result Value Ref Range Status   MRSA by PCR NEGATIVE NEGATIVE Final    Comment:        The GeneXpert MRSA Assay (FDA approved for NASAL specimens only), is one component of a comprehensive MRSA colonization surveillance program. It is not intended to diagnose MRSA infection nor to guide or monitor treatment for MRSA infections.   Culture, respiratory (NON-Expectorated)     Status: None   Collection  Time: 11/01/15 12:22 PM  Result Value Ref Range Status   Specimen Description SPUTUM  Final   Special Requests NONE  Final   Gram Stain   Final    ABUNDANT WBC PRESENT, PREDOMINANTLY PMN FEW SQUAMOUS EPITHELIAL CELLS PRESENT FEW GRAM POSITIVE COCCI IN PAIRS FEW GRAM POSITIVE RODS THIS SPECIMEN IS ACCEPTABLE FOR SPUTUM CULTURE Performed at [facility]    Culture   Final    NORMAL OROPHARYNGEAL FLORA Performed at [facility]    Report Status 11/03/2015 FINAL  Final  Culture, body fluid-bottle     Status: None (Preliminary result)   Collection Time: 11/04/15  1:04 PM  Result Value Ref Range Status   Specimen Description FLUID PERITONEAL  Final   Special Requests NONE  Final   Culture NO GROWTH 2 DAYS  Final   Report Status PENDING  Incomplete  Gram stain     Status: None   Collection Time: 11/04/15  1:04 PM  Result Value Ref Range Status   Specimen Description FLUID PERITONEAL  Final   Special Requests NONE  Final   Gram Stain   Final    FEW WBC PRESENT,BOTH PMN AND MONONUCLEAR NO ORGANISMS SEEN    Report Status 11/04/2015 FINAL  Final    Radiology Reports Ct Head Wo Contrast  10/31/2015  CLINICAL DATA:  Alcohol abuse. Dyspnea requiring intubation. Altered mental status. Hypotensive post intubation requiring norepinephrine. EXAM: CT HEAD WITHOUT CONTRAST TECHNIQUE: Contiguous axial images were obtained from the base of the skull through the vertex without intravenous contrast. COMPARISON:  11/12/2013 head CT. FINDINGS: Two oral route tubes enter the upper trachea and hypopharynx on the lateral scout topogram. Mild fluid is seen layering in the nasopharynx. No evidence of parenchymal hemorrhage or extra-axial fluid collection. No mass lesion, mass effect, or midline shift. No CT evidence of acute infarction. Intracranial atherosclerosis. Nonspecific mild subcortical and periventricular white matter hypodensity, most in keeping with chronic small vessel ischemic  change. Cerebral volume is age appropriate. No ventriculomegaly. The visualized paranasal sinuses are essentially clear. The mastoid air cells are unopacified. No evidence of calvarial fracture. IMPRESSION: 1.  No evidence of acute intracranial abnormality. 2. Intracranial atherosclerosis and mild chronic small vessel ischemia. Electronically Signed   By: Delbert Phenix M.D.   On: 10/31/2015 20:33   US Abdomen Complete  10/31/2015  CLINICAL DATA:  Inpatient with cirrhosis, shock and renal failure. EXAM: ABDOMEN ULTRASOUND COMPLETE COMPARISON:  01/07/2011 CT abdomen/ pelvis. FINDINGS: Gallbladder: No gallstones or wall thickening visualized. No sonographic Murphy sign noted by sonographer. Common bile duct: Diameter: 4 mm Liver: Liver parenchyma is diffusely heterogeneous in echotexture in the liver surface is diffusely irregular in contour, in keeping with cirrhosis. There is reversal of flow in the main portal vein. No liver masses demonstrated. IVC: Not visualized. Pancreas: Not well visualized due to overlying bowel gas and other patient related factors for Spleen: Size and appearance within normal limits. Right Kidney: Length: 10.6 cm. Echogenicity within normal limits. No mass or hydronephrosis visualized. Left Kidney: Length: 11.2 cm. Echogenicity within normal limits. No mass or hydronephrosis visualized. Abdominal aorta: No aneurysm visualized. Other findings: Moderate volume ascites. IMPRESSION: 1. Cirrhosis.  No liver mass detected. 2. Reversed (hepatofugal) flow in the main portal vein. Moderate volume ascites. No splenomegaly . 3. Normal gallbladder with no cholelithiasis. No biliary ductal dilatation. Electronically Signed   By: Delbert Phenix M.D.   On: 10/31/2015 23:58   Dg Chest Port 1 View  11/06/2015  CLINICAL DATA:  Pulmonary edema, acute respiratory failure, cirrhosis, renal failure EXAM: PORTABLE CHEST 1 VIEW COMPARISON:  Portable chest x-ray of November 05, 2015 FINDINGS: The lungs are mildly  hypoinflated. Parenchymal density medially at the right lung base persists. The cardiac silhouette is top-normal in size. The central pulmonary vascularity is mildly prominent. No pleural effusion or pneumothorax is observed. The left internal jugular venous catheter tip projects over the mid portion of the SVC. IMPRESSION: Mild pulmonary interstitial prominence stable likely reflecting mild interstitial edema. Confluent alveolar opacity at the right lung base medially consistent with atelectasis or pneumonia. No significant pleural effusion and no pneumothorax. Electronically Signed   By: David  [place] M.D.   On: 11/06/2015 07:20   Dg Chest Port 1 View  11/05/2015  CLINICAL DATA:  Dyspnea EXAM: PORTABLE CHEST 1 VIEW COMPARISON:  11/02/2015 FINDINGS: Left internal jugular central line again identified. Tip obscured by overlying EKG leads. Heart size and vascular pattern normal. Low lung volumes. Bilateral lower lobe opacities right worse than left. There has been mild improvement in bilateral lung base aeration. IMPRESSION: Persistent but improved bibasilar opacities. Electronically Signed   By: Esperanza Heir M.D.   On: 11/05/2015 07:11   Dg Chest Port 1 View  11/02/2015  CLINICAL DATA:  55 year old male with respiratory failure. Subsequent encounter. EXAM: PORTABLE CHEST 1 VIEW COMPARISON:  11/01/2015. FINDINGS: Endotracheal tube tip 3.6 cm above the carina. Left central line is in place with the tip angulated possibly entering the azygos vein unchanged from prior exam. Lateral view be necessary for further delineation. No gross pneumothorax. Nasogastric tube courses below the diaphragm. Tip is not included on the present exam. Asymmetric airspace disease greatest lung bases more notable on the right may represent pulmonary edema with basilar atelectasis, pleural fluid or infiltrate. Heart size top-normal. Calcified aorta. IMPRESSION: Similar appearance of asymmetric airspace disease most notable lung bases  greater on the right. Left central line tip possibly entering the azygos vein. Please see above. Electronically Signed   By: Lacy Duverney M.D.   On: 11/02/2015 08:07   Dg Chest Port 1 View  11/01/2015  CLINICAL DATA:  Central line placement. EXAM: PORTABLE CHEST 1 VIEW COMPARISON:  Yesterday. FINDINGS: Endotracheal tube in satisfactory position. Nasogastric tube extending into the stomach. Interval left jugular catheter with its tip directed superiorly. Poor inspiration. Normal sized heart. Mild increase in bibasilar opacity. Unremarkable bones. IMPRESSION: 1. Poor inspiration with increased bibasilar atelectasis. 2. Left jugular catheter tip directed superiorly. This may be in the right innominate vein, azygos vein or proximal superior vena cava. 3. No pneumothorax. Electronically Signed   By: Beckie Salts M.D.   On: 11/01/2015 07:13   Dg Chest Portable 1 View  10/31/2015  CLINICAL DATA:  55 year old male status post intubation. EXAM: PORTABLE CHEST 1 VIEW COMPARISON:  Chest x-ray 11/13/2013. FINDINGS: An endotracheal tube is in place with tip 2.8 cm above the carina. A nasogastric tube is seen extending into the stomach, however, the tip of the nasogastric tube extends below the lower margin of the image. Lung volumes are very low. Opacity at the right base presumably reflects atelectasis in the right middle and lower lobes. Left lung appears relatively clear with minimal subsegmental atelectasis in the left lower lobe. No definite pleural effusions. No evidence of pulmonary edema. Heart size is normal. The patient is rotated to the right on today's exam, resulting in distortion of the mediastinal contours and reduced diagnostic sensitivity and specificity for mediastinal pathology. IMPRESSION: 1. Support apparatus, as above. 2. Low lung volumes with extensive atelectasis in the right middle and lower lobes. Electronically Signed   By:  Trudie Reedaniel  Entrikin M.D.   On: 10/31/2015 15:47    Time Spent in minutes   35   Eddie NorthHUNGEL, Genesis Novosad M.D on 11/06/2015 at 2:31 PM  Between 7am to 7pm - Pager - [phone]  After 7pm go to [email or web address] - password [email or web address]  [facility] -  Office  [phone]

## 2015-11-06 NOTE — Care Management Note (Signed)
Case Management Note  Patient Details  Name: Teola Bradleyhomas Salata MRN: [id number] Date of Birth: 11/23/1960  Subjective/Objective:         Lives at home alone.  Has walker, cane and crutches at home - states usually uses cane or crutches.  Sister and nephew in room with him.  Patient understands he needs rehab prior to going  Home.  Has only been at an OP rehab prior[place]  [place] Agreeable to SNF rehab for ST rehab.  All questions answered.  SW consult placed.          Action/Plan:   Expected Discharge Date:                  Expected Discharge Plan:  Skilled Nursing Facility  In-House Referral:  Clinical Social Work  Discharge planning Services  CM Consult  Post Acute Care Choice:    Choice offered to:     DME Arranged:    DME Agency:     HH Arranged:    HH Agency:     Status of Service:  In process, will continue to follow  Medicare Important Message Given:    Date Medicare IM Given:    Medicare IM give by:    Date Additional Medicare IM Given:    Additional Medicare Important Message give by:     If discussed at Long Length of Stay Meetings, dates discussed:    Additional Comments:  Vangie BickerBrown, Edythe Riches Jane, RN 11/06/2015, 1:47 PM

## 2015-11-06 NOTE — Procedures (Signed)
US abdo  1. Small ascites diffuse, NOT enough to tap  Not cuase of distention No para For CT  Mcarthur Rossettianiel J. Tyson AliasFeinstein, MD, FACP Pgr: [phone] [facility]

## 2015-11-06 NOTE — Progress Notes (Signed)
No CBC will be collected this afternoon per MD Tyson AliasFeinstein. Labs collected tomorrow.

## 2015-11-06 NOTE — Progress Notes (Signed)
Patient ID: Teola Bradleyhomas Mcintyre, male   DOB: 01/22/1961, 55 y.o.   MRN: [id number]    Progress Note   Subjective   Looks remarkably better than 4 days ago, sitting up ,alert reading mail- no c/o  Tolerated paracentesis of 3.3 liters yesterday  hgb 6.9 post 12-13 units   Objective   Vital signs in last 24 hours: Temp:  [97.6 F (36.4 C)-99.3 F (37.4 C)] 98.2 F (36.8 C) (04/21 0844) Pulse Rate:  [80-137] 100 (04/21 0900) Resp:  [0-20] 0 (04/21 0900) BP: (94-143)/(64-99) 125/85 mmHg (04/21 0900) SpO2:  [90 %-100 %] 100 % (04/21 0900) Weight:  [257 lb 4.4 oz (116.7 kg)] 257 lb 4.4 oz (116.7 kg) (04/21 0500) Last BM Date: 11/05/15 General:    AA male in NAD Heart:  Regular rate and rhythm; no murmurs Lungs: Respirations even and unlabored, lungs CTA bilaterally Abdomen:  Tight ascites, nontenders. Extremities:  2-3+ edema Neurologic:  Alert and oriented,  grossly normal neurologically. Psych:  Cooperative. Normal mood and affect.  Intake/Output from previous day: 04/20 0701 - 04/21 0700 In: 1509.1 [P.O.:300; I.V.:494.1; Blood:365; IV Piggyback:350] Out: 2950 [Urine:2950] Intake/Output this shift: Total I/O In: 667.4 [P.O.:250; I.V.:33.4; Blood:284; IV Piggyback:100] Out: 325 [Urine:325]  Lab Results:  Recent Labs  11/05/15 0745 11/05/15 1719 11/06/15 0515  WBC 7.5 8.0 7.9  HGB 6.2* 7.1* 6.9*  HCT 18.0* 20.7* 20.0*  PLT 55* 62* 67*   BMET  Recent Labs  11/04/15 1805 11/05/15 0552 11/06/15 0515  NA 136 135 135  K 3.0* 3.0* 2.9*  CL 107 105 106  CO2 18* 18* 20*  GLUCOSE 141* 132* 112*  BUN 39* 37* 36*  CREATININE 2.58* 2.32* 2.13*  CALCIUM 7.4* 7.4* 7.8*   LFT  Recent Labs  11/06/15 0515  PROT 6.0*  ALBUMIN 1.9*  AST 109*  ALT 69*  ALKPHOS 66  BILITOT 8.0*   PT/INR  Recent Labs  11/04/15 0623 11/05/15 0552  LABPROT 28.8* 27.8*  INR 2.77* 2.63*    Assessment / Plan:    #1 55 yo male with severely decompensated ETOH/HEP C cirrhosis admitted  6 days ago  With shock/pneumonia #2 Anasarca- wt stable past 24 hours despite paracentesis of 3 liters #3 severe persistent anemia - s/p multiple units- no overt GI bleeding- hematology feel  DIC related to cirrhosis #4 AKI #5 AMS- resolved  #6 severe coagulopathy  No changes in management today from GI perspective   LOS: 6 days   Amy Esterwood  11/06/2015, 10:43 AM    [facility] Attending   Agree with Ms. Oswald HillockEsterwood's assessment and plan.  Also d/w Dr. Tyson AliasFeinstein - agree w/ checking CT re" ? RP hematoma - maybe he bled there - no signs major GI bleeding  Iva Booparl E. Carlson Belland, MD, [facility]AC[facility]

## 2015-11-06 NOTE — Progress Notes (Signed)
HEMATOLOGY Patient has not been seen labs and notes are reviewed. Hemoglobin 6.9 Platelets 67 Creatinine 2.13 AST 109, ALT 69 Total bilirubin 8 INR on 11/05/2015: 2.63 PTT 43 Fibrinogen 119 on 11/04/2015 LDH decreased to 353  Status post paracentesis 3 L taken out Status post cardioversion and amiodarone drip for atrial fibrillation  Assessment and plan: 1. Severe anemia due to bone marrow suppression: There was no evidence of schistocytes are clear-cut evidence of hemolysis. It is difficult to interpret the results of LDH and haptoglobin in a cirrhotic patient. It does appear to the LDH is coming down. 2. Platelets have stabilized. 3. Patient still requiring blood transfusions on a regular basis. Continue with current supportive care with blood transfusions as needed. 4. Cirrhosis of the liver 5. Atrial fibrillation  Please do not stay to call over the weekend on-call hematology for further questions or concerns.

## 2015-11-06 NOTE — Progress Notes (Addendum)
PULMONARY / CRITICAL CARE MEDICINE   Name: [name] MRN: [id number] DOB: March 01, 1961    ADMISSION DATE:  10/31/2015  CHIEF COMPLAINT:  Obtundation   55 yo male former smoker with alcoholic cirrhosis presents with shock, AKI with severe anemia >> likely acute on chronic blood loss.  PMHx of HTN, Lt leg neuropathy.  STUDIES:  4/15 CT head >> chronic small vessel ischemic changes 4/15 Abd u/s >> cirrhosis, ascites  CULTURES: Blood 4/15 >> ng Urine 4/15 >> ng  ANTIBIOTICS: Vanco 4/15 >>4/17  Cefepime 4/15 >> 04/18 Zosyn 04/19 >> 04/20  SIGNIFICANT EVENTS 4/15 Admit, multiple units of PRBC/FFP; limited code established 4/16 SVT >> cardioversion/start amiodarone; transfuse 2 units PRBC; off pressors 4/17 extubated 4/18 Hgb continues to drop 4/19- paracentesis for resp failure, 3.3 liter yellow  SUBJECTIVE:  No acute events overnight.  Hgb 6.9 --> 1 unit PRBCs is running now. Pt reports breathing is ok but does get dyspneic with minimal exertion.  Worked with PT (was able to sit up on side of bed) yesterday but session stopped due to patient symptoms and HR elevated to 160s. This AM he reports sinus/allergy problem that is usual for him during this time of year.  He is requesting home allergy med (cetirizine).  VITAL SIGNS: BP 94/64 mmHg  Pulse 97  Temp(Src) 99 F (37.2 C) (Oral)  Resp 20  Ht 6' (1.829 m)  Wt 257 lb 4.4 oz (116.7 kg)  BMI 34.89 kg/m2  SpO2 100%  HEMODYNAMICS: CVP:  [8 mmHg-30 mmHg] 20 mmHg  VENTILATOR SETTINGS:  N/A  INTAKE / OUTPUT: I/O last 3 completed shifts: In: 2402.8 [P.O.:300; I.V.:1287.8; Blood:365; IV Piggyback:450] Out: 3925 [Urine:3925]  PHYSICAL EXAMINATION: General: chr ill appearing, awake and alert, watching TV, in NAD Neuro: alert, interactive,follows simple commands HEENT: pale, no JVD, icterus + Cardiac: RRR, no m/r/g Chest: b/l basilar crackles Abd: soft, distended, non tender, bandage to RLQ c/d/i Ext: 3+ pitting  edema bilaterally legs to thighs, scrotum Skin: chronic venous stasis changes, LIJ site bandaged and c/d/i    LABS:  BMET  Recent Labs Lab 11/04/15 1805 11/05/15 0552 11/06/15 0515  NA 136 135 135  K 3.0* 3.0* 2.9*  CL 107 105 106  CO2 18* 18* 20*  BUN 39* 37* 36*  CREATININE 2.58* 2.32* 2.13*  GLUCOSE 141* 132* 112*    Electrolytes  Recent Labs Lab 11/02/15 0300 11/03/15 0442 11/04/15 0623 11/04/15 1805 11/05/15 0552 11/06/15 0515  CALCIUM 7.0* 6.9* 7.3* 7.4* 7.4* 7.8*  MG 1.9 1.6* 1.5* 1.7  --   --   PHOS 2.0* 2.0* 2.6  --   --   --     CBC  Recent Labs Lab 11/05/15 0745 11/05/15 1719 11/06/15 0515  WBC 7.5 8.0 7.9  HGB 6.2* 7.1* 6.9*  HCT 18.0* 20.7* 20.0*  PLT 55* 62* 67*    Coag's  Recent Labs Lab 11/01/15 0917  11/03/15 1040 11/04/15 0623 11/05/15 0552  APTT 42*  --  46*  --  43*  INR 3.10*  < > 3.73* 2.77* 2.63*  < > = values in this interval not displayed.  Sepsis Markers  Recent Labs Lab 10/31/15 2045 10/31/15 2237 11/01/15 0843 11/01/15 1229  LATICACIDVEN  --  7.6* 2.8* 2.1*  PROCALCITON 0.58  --   --   --     ABG  Recent Labs Lab 11/01/15 0425 11/01/15 1741 11/02/15 0534  PHART 7.442 7.476* 7.467*  PCO2ART 29.9* 32.8* 31.6*  PO2ART 184* 165.0* 168*  Liver Enzymes  Recent Labs Lab 11/02/15 0300 11/04/15 1413 11/05/15 0552 11/06/15 0515  AST 431*  --  144* 109*  ALT 101*  --  73* 69*  ALKPHOS 61  --  59 66  BILITOT 5.1*  --  6.8* 8.0*  ALBUMIN 1.9* 2.1* 1.9* 1.9*    Cardiac Enzymes  Recent Labs Lab 11/01/15 1742 11/02/15 0300 11/02/15 0850  TROPONINI 0.05* 0.05* 0.04*    Glucose  Recent Labs Lab 11/05/15 0811 11/05/15 1056 11/05/15 1548 11/05/15 1920 11/05/15 2353 11/06/15 0323  GLUCAP 108* 119* 121* 126* 151* 104*    Imaging No results found. No recent imaging.  Procedures: 4/19: Paracentesis 3.3 L removed  LINES/TUBES: ETT 4/15 >> 4/17 Lt IJ CVL 4/16 >> Rt radial aline  4/16 >>4/17  DISCUSSION: Unclear cause of severe anemia - low haptoglobin raises concern for hemolysis  ASSESSMENT / PLAN:  PULMONARY A: Acute respiratory failure in setting of shock, severe acidosis, anemia, and pneumonia.  Net + 17L. pulm edema improving P:   Resolved failure with lasix and para Continue TID lasix, Keep neg IS Add home allergy med per his request.  No cetirizine on formulary --> loratidine ok For para as will again affect resp status  CARDIOVASCULAR A:  Hypovolemic shock 2nd to severe anemia. Hx of HTN. SVT s/p cardioversion 4/16.  Intermittent AFib.  - rates better Echo EF 50% Tachycardia SVT vs A fib P:  Convert IV --> po amio today Keep k greater 4, mag greater 2 Replacing potassium and mag  RENAL A:   AKI >> baseline creatinine 0.72 from 11/28/14.  Cr downtrending. FeNa <1 s/p pre-renal, ? hepato renal; good UOP but still net +17L Acute metabolic acidosis, lactic acidosis >> improved. Hypokalemia Hypomagnesemia P:   -Monitor, no sign of uremia -continue Lasix q8h -replete lytes k  GASTROINTESTINAL A:   ETOH cirrhosis with ascites. Presumed chronic GI bleed, NOT noted clinically P:   Soft diet Protonix  Thiamine, folic acid supp GI following, EGD deferred Need repeat para given significant distention.  HEMATOLOGIC A:   Severe anemia >> likely acute on chronic blood loss, retic inappropriately low, low haptoglobin raises concern for hemolysis but patient is cirrhotic; fibrinogen- 119, LDH -353, retic count 3.6%, factor 8 level 282 (high) Thrombocytopenia, coagulopathy 2nd to ETOH S/p Vitamin K x 3 NO DIC likely ( factor 8 wnl) P:  Transfuse for Hb < 7 or bleeding; currently getting 1 unit PRBCs for Hgb 6.9 this AM No large GI losses noted, FOBT positive 4/15 If products needed - use cryo plt stable Send indirect bili level send   INFECTIOUS A:   No evidence of SBP on paracentesis with WBC of 5 HIV and hep panel - all neg P:    Monitor CBC  ENDOCRINE A:   Hypoglycemia - improved P:   Encourage po, monitor CBGs, D50 as needed  NEUROLOGIC A:   Acute metabolic encephalopathy- resolved High risk for hepatic encephalopathy P:   Monitor mental status Continue PT as able  Goals of care >> No CPR, need to establish POA, palliative care is following and recommendations are appreciated.  Updates: patient updated    Yolanda MangesAlex M Wilson, DO [facility] PGY-3 11/06/2015, 7:11 AM    STAFF NOTE: I, Rory Percyaniel Feinstein, MD FACP have personally reviewed patient's available data, including medical history, events of note, physical examination and test results as part of my evaluation. I have discussed with resident/NP and other care providers such as pharmacist, RN and RRT.  In addition, I personally evaluated patient and elicited key findings of: he is improved overall, abdo is more distended will [place] and para again, likely this is all terminal liver dz, low grade bleed? Vs hemolysis ( difficult to interpret), need to discuss limiting products at a certain number of units, continued lasix, chem in am , move to floor, amio to oral, triad to take over completely, will sign off today, full No code blue, for 1 unit now, cbc in am , also new erythema, swelling left upper ext, hematoma???, get ct   Mcarthur Rossetti. Tyson Alias, MD, FACP Pgr: [phone] [facility] 11/06/2015 11:46 AM

## 2015-11-06 NOTE — Progress Notes (Signed)
CRITICAL VALUE ALERT  Critical value received:  Hgb 6.2  Date of notification:  11/06/15  Time of notification:  0544  Critical value read back:Yes.    Nurse who received alert:  Abe PeopleStephanie Viviana Trimble  MD notified (1st page):  Earlene PlaterWallace  Time of first page:  0544  MD notified (2nd page):  Time of second page:  Responding MD:  Earlene PlaterWallace  Time MD responded:  [phone]

## 2015-11-06 NOTE — Progress Notes (Signed)
Called Resident [name] to discuss discontinuation of Foley.  Described the pts severe swelling in the penis and scrotum as well as orders for Lasix.  [name] advised not to d/c foley at this moment due to pt condition.

## 2015-11-06 NOTE — Progress Notes (Signed)
Notified MD Dhungel in regards to CT of left femur showing hematoma in muscle. MD Dhungel advised to monitor and recheck CBC in AM.

## 2015-11-07 DIAGNOSIS — D62 Acute posthemorrhagic anemia: Secondary | ICD-10-CM

## 2015-11-07 DIAGNOSIS — S7012XA Contusion of left thigh, initial encounter: Secondary | ICD-10-CM

## 2015-11-07 DIAGNOSIS — R601 Generalized edema: Secondary | ICD-10-CM

## 2015-11-07 LAB — CBC
HEMATOCRIT: 20.9 % — AB (ref 39.0–52.0)
HEMOGLOBIN: 7.1 g/dL — AB (ref 13.0–17.0)
MCH: 28.4 pg (ref 26.0–34.0)
MCHC: 34 g/dL (ref 30.0–36.0)
MCV: 83.6 fL (ref 78.0–100.0)
Platelets: 109 10*3/uL — ABNORMAL LOW (ref 150–400)
RBC: 2.5 MIL/uL — ABNORMAL LOW (ref 4.22–5.81)
RDW: 16.2 % — AB (ref 11.5–15.5)
WBC: 9.6 10*3/uL (ref 4.0–10.5)

## 2015-11-07 LAB — PREPARE PLATELET PHERESIS: UNIT DIVISION: 0

## 2015-11-07 LAB — COMPREHENSIVE METABOLIC PANEL
ALK PHOS: 66 U/L (ref 38–126)
ALT: 58 U/L (ref 17–63)
AST: 85 U/L — ABNORMAL HIGH (ref 15–41)
Albumin: 1.9 g/dL — ABNORMAL LOW (ref 3.5–5.0)
Anion gap: 9 (ref 5–15)
BILIRUBIN TOTAL: 10 mg/dL — AB (ref 0.3–1.2)
BUN: 34 mg/dL — ABNORMAL HIGH (ref 6–20)
CALCIUM: 7.8 mg/dL — AB (ref 8.9–10.3)
CO2: 21 mmol/L — ABNORMAL LOW (ref 22–32)
CREATININE: 1.79 mg/dL — AB (ref 0.61–1.24)
Chloride: 105 mmol/L (ref 101–111)
GFR, EST AFRICAN AMERICAN: 48 mL/min — AB (ref >60)
GFR, EST NON AFRICAN AMERICAN: 42 mL/min — AB (ref >60)
Glucose, Bld: 131 mg/dL — ABNORMAL HIGH (ref 65–99)
Potassium: 3.1 mmol/L — ABNORMAL LOW (ref 3.5–5.1)
Sodium: 135 mmol/L (ref 135–145)
TOTAL PROTEIN: 6.4 g/dL — AB (ref 6.5–8.1)

## 2015-11-07 LAB — MAGNESIUM: Magnesium: 1 mg/dL — ABNORMAL LOW (ref 1.7–2.4)

## 2015-11-07 LAB — GLUCOSE, CAPILLARY
GLUCOSE-CAPILLARY: 103 mg/dL — AB (ref 65–99)
GLUCOSE-CAPILLARY: 103 mg/dL — AB (ref 65–99)
GLUCOSE-CAPILLARY: 104 mg/dL — AB (ref 65–99)
GLUCOSE-CAPILLARY: 111 mg/dL — AB (ref 65–99)
GLUCOSE-CAPILLARY: 127 mg/dL — AB (ref 65–99)
Glucose-Capillary: 129 mg/dL — ABNORMAL HIGH (ref 65–99)

## 2015-11-07 MED ORDER — MAGNESIUM SULFATE 4 GM/100ML IV SOLN
4.0000 g | Freq: Once | INTRAVENOUS | Status: AC
  Administered 2015-11-07: 4 g via INTRAVENOUS
  Filled 2015-11-07: qty 100

## 2015-11-07 MED ORDER — ALBUMIN HUMAN 25 % IV SOLN
50.0000 g | Freq: Once | INTRAVENOUS | Status: AC
  Administered 2015-11-07: 50 g via INTRAVENOUS
  Filled 2015-11-07: qty 200

## 2015-11-07 MED ORDER — FUROSEMIDE 10 MG/ML IJ SOLN
80.0000 mg | Freq: Four times a day (QID) | INTRAMUSCULAR | Status: DC
  Administered 2015-11-07 – 2015-11-13 (×23): 80 mg via INTRAVENOUS
  Filled 2015-11-07 (×27): qty 8

## 2015-11-07 MED ORDER — POTASSIUM CHLORIDE CRYS ER 20 MEQ PO TBCR
40.0000 meq | EXTENDED_RELEASE_TABLET | Freq: Once | ORAL | Status: AC
  Administered 2015-11-07: 40 meq via ORAL
  Filled 2015-11-07: qty 2

## 2015-11-07 MED ORDER — LORATADINE 10 MG PO TABS
10.0000 mg | ORAL_TABLET | Freq: Every day | ORAL | Status: DC | PRN
  Administered 2015-11-07 – 2015-11-13 (×7): 10 mg via ORAL
  Filled 2015-11-07 (×8): qty 1

## 2015-11-07 NOTE — Progress Notes (Signed)
PROGRESS NOTE                                                                                                                                                                                                             Patient Demographics:    [name], is a 55 y.o. male, DOB - 1960-08-30, ZOX:[id number]  Admit date - 10/31/2015   Admitting Physician Leslye Peer, MD  Outpatient Primary MD for the patient is [name], [name]  LOS - 7  Outpatient Specialists: None  Chief Complaint  Patient presents with  . Shortness of Breath       Brief Narrative   55 year old male with history of alcoholic cirrhosis, hypertension, OSA (not on CPAP) who was brought to the ED for dyspnea and quickly became obtunded. He was urgently intubated in the ED. Postintubation became hypotensive and was started on norepinephrine. Labs showed severe anemia with hemoglobin of 2.7 and some cytopenia. Was transferred with several units of PRBC and FFP. No source of bleeding noted. He was found to have severely elevated lactic acid of 14 with acute kidney injury and elevated INR of 4.35. He was started on empiric vancomycin and cefepime and then switch to Zosyn. Patient also developed SVT on 4/16 and was started on amiodarone drip. Pressures were discontinued and patient extubated on 4/17. On 4/19 patient had therapeutic paracentesis with 3.3 L yellow fluid removed for increasing ascites and respiratory distress. Transferred to  hospitalist service on 4/21. CT abdomen and pelvis done on 4/21 showed large hematoma in the left upper thigh. GI and hematology following.    Subjective:    complains of abdominal distention and wheezing   Assessment  & Plan :   Principal problem Hypovolemic shock  due to severe anemia. Received several units of PRBC this hospitalization.  Continue Protonix. GI  If hb <7. SBP negative .    Active  Problems: Acute hypoxic respiratory failure with shock Secondary to hypovolemic shock, metabolic acidosis and possible pneumonia and pulmonary edema. Significant positive balance with pulmonary edema. Started on aggressive IV Lasix. Monitor strict I/O. Maintaining O2 sat on RA.. Added nebs. Continue bedside spirometry.  Decompensated alcohol excess hep C cirrhosis with anasarca and acute encephalopathy Therapeutic paracentesis done on 4/17 with 3.3 L removed and placed on aggressive IV Lasix.  attempted paracentesis on 4/21  without fluid yield. abdominal distension is likely from severe third spacing seen on CT. Encephalopathy resolved. Increase IV lasix to q6hr. Strict I/O . Will order IV albumin.  Severe Anemia and thrombocytopenia ? DIC with cirrhosis. Also found t have large left thigh hematoma on CT which is contribution. monitor with transfuse prn.Hemoccult-positive. elevated LDH and fibrinogen. Hematology consulted , recommended severe anemia and some cytopenia is contributed by bone marrow suppression as well. Elevated LDH and haptoglobin difficult to interpret in a cirrhotic patient. Recommend supportive care. Platelets slowly improving.  [facility] following.    Sinus tachycardia with SVT versus A. fib Required amiodarone drip in the ICU. Now with better control. Switched to by mouth. K >4 and mag >2. Next line 2-D echo shows EF of 50-50% without wall motion abnormality.  Acute kidney injury Possibly due to hypovolemic shock, now with cirrhosis and pulmonary edema. Getting aggressive diuresis with Lasix. Renal function slowly improving.    Shock Likely hypovolemic with acute blood loss and ? DIC. No signs of SBP. Cultures negative. Concern for pneumonia on admission and has received empiric antibiotics. Lactic acid resolved.  Hypoglycemia Possibly due to shock and poor by mouth intake. Resolved.  Hypokalemia / Hypomagnesemia Replenished.  ? Colitis on CT abdomen Colonic  wall thickening noted. Possible  colonoscopy as outpt. Defer to GI.     Code Status : DO NOT RESUSCITATE  Family Communication  : None at bedside. Will update daughter  Disposition Plan  : PT recommend skilled nursing facility. Possibly some time next week once improved  Barriers For Discharge : Ongoing anasarca, respiratory failure and ascites, persistent anemia requiring transfusion  Consults  :   PC CM [facility] hematology ( Dr Pamelia Hoit)  Procedures  :  Intubation 2-D echo CT head, ultrasound abdomen Therapeutic paracentesis CT abdomen  and pelvis, CT pelvis  DVT Prophylaxis  :  SCD's  Lab Results  Component Value Date   PLT 109* 11/07/2015    Antibiotics  :  Completed  Anti-infectives    Start     Dose/Rate Route Frequency Ordered Stop   11/04/15 1400  piperacillin-tazobactam (ZOSYN) IVPB 3.375 g  Status:  Discontinued     3.375 g 12.5 mL/hr over 240 Minutes Intravenous Every 8 hours 11/04/15 1059 11/05/15 1021   11/04/15 1130  metroNIDAZOLE (FLAGYL) IVPB 500 mg  Status:  Discontinued     500 mg 100 mL/hr over 60 Minutes Intravenous Every 8 hours 11/04/15 1032 11/04/15 1059   11/01/15 1600  ceFEPIme (MAXIPIME) 2 g in dextrose 5 % 50 mL IVPB  Status:  Discontinued     2 g 100 mL/hr over 30 Minutes Intravenous Every 24 hours 10/31/15 1715 11/04/15 1059   11/01/15 0400  vancomycin (VANCOCIN) IVPB 750 mg/150 ml premix  Status:  Discontinued     750 mg 150 mL/hr over 60 Minutes Intravenous Every 12 hours 10/31/15 1715 11/02/15 1206   10/31/15 1600  ceFEPIme (MAXIPIME) 2 g in dextrose 5 % 50 mL IVPB     2 g 100 mL/hr over 30 Minutes Intravenous  Once 10/31/15 1521 10/31/15 1650   10/31/15 1600  vancomycin (VANCOCIN) 1,750 mg in sodium chloride 0.9 % 500 mL IVPB     1,750 mg 250 mL/hr over 120 Minutes Intravenous NOW 10/31/15 1521 10/31/15 1814        Objective:   Filed Vitals:   11/07/15 0402 11/07/15 0419 11/07/15 0618 11/07/15 0757  BP: 123/76 131/80   130/97  Pulse: 107 105  106  Temp: 99.8 F (37.7 C) 98.9 F (37.2 C)  99 F (37.2 C)  TempSrc: Oral Oral  Oral  Resp: Height:      Weight:   114.941 kg (253 lb 6.4 oz)   SpO2: 99% 99%  100%    Wt Readings from Last 3 Encounters:  11/07/15 114.941 kg (253 lb 6.4 oz)  11/28/14 89.268 kg (196 lb 12.8 oz)  11/14/14 90.901 kg (200 lb 6.4 oz)     Intake/Output Summary (Last 24 hours) at 11/07/15 0845 Last data filed at 11/07/15 0419  Gross per 24 hour  Intake   1372 ml  Output   2250 ml  Net   -878 ml     Physical Exam  Gen: Middle aged male appears fatigued, not in distress HEENT: Pallor present, no icterus,, moist mucosa, supple neck,  Chest: Diminished bilateral breath sounds,  No wheezing CVS: N S1&S2, no murmurs, rubs or gallop GI: Distended with ascites, nontender, foley + Musculoskeletal: warm, 2+ pitting edema bilaterally with anasarca, , scrotal edema, erythema over left anterior thigh ( from hematoma) CNS: Alert and oriented, no tremors    Data Review:    CBC  Recent Labs Lab 10/31/15 1505  11/03/15 2245  11/04/15 1805 11/05/15 0745 11/05/15 1719 11/06/15 0515 11/06/15 1109 11/07/15 0520  WBC 7.2  < > 8.1  < > 10.1 7.5 8.0 7.9  --  9.6  HGB 2.7*  < > 5.7*  < > 7.2* 6.2* 7.1* 6.9* 7.5* 7.1*  HCT 8.9*  < > 16.8*  < > 20.2* 18.0* 20.7* 20.0* 21.5* 20.9*  PLT 47*  < > 63*  < > 66* 55* 62* 67*  --  109*  MCV 84.8  < > 84.0  < > 83.5 82.2 83.1 82.0  --  83.6  MCH 25.7*  < > 28.5  < > 29.8 28.3 28.5 28.3  --  28.4  MCHC 30.3  < > 33.9  < > 35.6 34.4 34.3 34.5  --  34.0  RDW 26.7*  < > 16.9*  < > 16.2* 16.2* 15.9* 16.0*  --  16.2*  LYMPHSABS 0.5*  --  0.7  --  0.8 0.6*  --   --   --   --   MONOABS 0.6  --  1.8*  --  3.1* 2.2*  --   --   --   --   EOSABS 0.0  --  0.1  --  0.2 0.3  --   --   --   --   BASOSABS 0.0  --  0.0  --  0.0 0.0  --   --   --   --   < > = values in this interval not displayed.  Chemistries   Recent Labs Lab  11/01/15 0420  11/02/15 0300 11/03/15 0442 11/04/15 0623 11/04/15 1805 11/05/15 0552 11/06/15 0515 11/06/15 1452 11/07/15 0520  NA 141  < > 143 139 136 136 135 135  --  135  K 3.3*  < > 3.5 3.1* 3.0* 3.0* 3.0* 2.9*  --  3.1*  CL 111  < > 112* 108 108 107 105 106  --  105  CO2 19*  < > 23 20* 19* 18* 18* 20*  --  21*  GLUCOSE 191*  < > 133* 125* 125* 141* 132* 112*  --  131*  BUN 36*  < > 35* 34* 38* 39* 37* 36*  --  34*  CREATININE 2.24*  < > 2.13* 2.22* 2.52* 2.58* 2.32* 2.13*  --  1.79*  CALCIUM 6.9*  < > 7.0* 6.9* 7.3* 7.4* 7.4* 7.8*  --  7.8*  MG 1.4*  --  1.9 1.6* 1.5* 1.7  --   --   --  1.0*  AST 500*  --  431*  --   --   --  144* 109*  --  85*  ALT 93*  --  101*  --   --   --  73* 69*  --  58  ALKPHOS 60  --  61  --   --   --  59 66  --  66  BILITOT 4.8*  --  5.1*  --   --   --  6.8* 8.0* 8.8* 10.0*  < > = values in this interval not displayed. ------------------------------------------------------------------------------------------------------------------ No results for input(s): CHOL, HDL, LDLCALC, TRIG, CHOLHDL, LDLDIRECT in the last 72 hours.  No results found for: HGBA1C ------------------------------------------------------------------------------------------------------------------ No results for input(s): TSH, T4TOTAL, T3FREE, THYROIDAB in the last 72 hours.  Invalid input(s): FREET3 ------------------------------------------------------------------------------------------------------------------  Recent Labs  11/04/15 1111  RETICCTPCT 3.6*    Coagulation profile  Recent Labs Lab 11/01/15 0917 11/02/15 0300 11/03/15 1040 11/04/15 0623 11/05/15 0552  INR 3.10* 3.24* 3.73* 2.77* 2.63*    No results for input(s): DDIMER in the last 72 hours.  Cardiac Enzymes  Recent Labs Lab 11/01/15 1742 11/02/15 0300 11/02/15 0850  TROPONINI 0.05* 0.05* 0.04*    ------------------------------------------------------------------------------------------------------------------    Component Value Date/Time   BNP 694.4* 10/31/2015 1911    Inpatient Medications  Scheduled Meds: . sodium chloride   Intravenous Once  . sodium chloride   Intravenous Once  . sodium chloride   Intravenous Once  . sodium chloride   Intravenous Once  . sodium chloride   Intravenous Once  . sodium chloride   Intravenous Once  . sodium chloride   Intravenous Once  . sodium chloride   Intravenous Once  . amiodarone  200 mg Oral Daily  . azelastine  2 spray Each Nare BID  . folic acid  1 mg Oral Daily  . furosemide  80 mg Intravenous TID  . insulin aspart  0-9 Units Subcutaneous 6 times per day  . magnesium sulfate 1 - 4 g bolus IVPB  4 g Intravenous Once  . pantoprazole  40 mg Oral Daily  . pneumococcal 23 valent vaccine  0.5 mL Intramuscular Tomorrow-1000  . thiamine  100 mg Oral Daily   Continuous Infusions:  PRN Meds:.albuterol, loratadine, sodium chloride flush, sodium chloride flush  Micro Results Recent Results (from the past 240 hour(s))  Blood culture (routine x 2)     Status: None   Collection Time: 10/31/15  3:30 PM  Result Value Ref Range Status   Specimen Description BLOOD RIGHT HAND  Final   Special Requests IN PEDIATRIC BOTTLE 3ML  Final   Culture NO GROWTH 5 DAYS  Final   Report Status 11/05/2015 FINAL  Final  Blood culture (routine x 2)     Status: None   Collection Time: 10/31/15  3:56 PM  Result Value Ref Range Status   Specimen Description BLOOD RIGHT HAND  Final   Special Requests BOTTLES DRAWN AEROBIC AND ANAEROBIC 5CC  Final   Culture NO GROWTH 5 DAYS  Final   Report Status 11/05/2015 FINAL  Final  Urine culture     Status: None   Collection Time: 10/31/15  7:14 PM  Result Value Ref  Range Status   Specimen Description URINE, CATHETERIZED  Final   Special Requests NONE  Final   Culture NO GROWTH 2 DAYS  Final   Report Status  11/02/2015 FINAL  Final  MRSA PCR Screening     Status: None   Collection Time: 10/31/15  8:45 PM  Result Value Ref Range Status   MRSA by PCR NEGATIVE NEGATIVE Final    Comment:        The GeneXpert MRSA Assay (FDA approved for NASAL specimens only), is one component of a comprehensive MRSA colonization surveillance program. It is not intended to diagnose MRSA infection nor to guide or monitor treatment for MRSA infections.   Culture, respiratory (NON-Expectorated)     Status: None   Collection Time: 11/01/15 12:22 PM  Result Value Ref Range Status   Specimen Description SPUTUM  Final   Special Requests NONE  Final   Gram Stain   Final    ABUNDANT WBC PRESENT, PREDOMINANTLY PMN FEW SQUAMOUS EPITHELIAL CELLS PRESENT FEW GRAM POSITIVE COCCI IN PAIRS FEW GRAM POSITIVE RODS THIS SPECIMEN IS ACCEPTABLE FOR SPUTUM CULTURE Performed at [facility]    Culture   Final    NORMAL OROPHARYNGEAL FLORA Performed at [facility]    Report Status 11/03/2015 FINAL  Final  Culture, body fluid-bottle     Status: None (Preliminary result)   Collection Time: 11/04/15  1:04 PM  Result Value Ref Range Status   Specimen Description FLUID PERITONEAL  Final   Special Requests NONE  Final   Culture NO GROWTH 2 DAYS  Final   Report Status PENDING  Incomplete  Gram stain     Status: None   Collection Time: 11/04/15  1:04 PM  Result Value Ref Range Status   Specimen Description FLUID PERITONEAL  Final   Special Requests NONE  Final   Gram Stain   Final    FEW WBC PRESENT,BOTH PMN AND MONONUCLEAR NO ORGANISMS SEEN    Report Status 11/04/2015 FINAL  Final    Radiology Reports Ct Abdomen Pelvis Wo Contrast  11/06/2015  CLINICAL DATA:  Edema. Decreasing hemoglobin. Cirrhotic patient with hemoccult positive stool. EXAM: CT ABDOMEN AND PELVIS WITHOUT CONTRAST TECHNIQUE: Multidetector CT imaging of the abdomen and pelvis was performed following the standard protocol without IV  contrast. COMPARISON:  Abdominal ultrasound 10/31/2015 FINDINGS: Lower chest: Right lung base consolidation with air bronchograms. Minimal left basilar opacity. Small subpulmonic left pleural effusion. Mild cardiomegaly. Decreased density of blood pool consistent with anemia. Liver: Nodular contours with cirrhotic morphology. No focal lesion detected on this unenhanced exam. Hepatobiliary: Gallbladder physiologically distended. There is high-density material within the dependent gallbladder and cystic duct. Common bile duct is not well-defined. Pancreas: No ductal dilatation. Spleen: Normal in size, maximal mention 12.8 cm. Adrenal glands: No nodule. Kidneys: No hydronephrosis or urolithiasis. Stomach/Bowel: Ingested contents in the stomach. Small hiatal hernia. Limited bowel assessment given intra-abdominal ascites. No large or small bowel obstruction. There is questionable colonic wall thickening at the splenic flexure of the colon. Formed stool in the ascending colon. Appendix tentatively but not definitively identified. Vascular/Lymphatic: Normal caliber abdominal aorta with trace atherosclerosis. No retroperitoneal adenopathy. Limited assessment for intra-abdominal adenopathy given mesenteric edema. Reproductive: Prostate gland normal in size. Bladder: Decompressed by Foley catheter. Other: Severe whole body wall edema and skin thickening. Moderate intra-abdominal ascites. Diffuse mesenteric edema and small volume mesenteric free fluid. No free air. Musculoskeletal: There are no acute or suspicious osseous abnormalities. Degenerative change involving the left  greater than right hip and throughout the spine. IMPRESSION: 1. Probable colonic wall thickening at the splenic flexure of the colon, limited bowel evaluation given lack of enteric contrast and intra-abdominal ascites. This may be reactive or colitis. Ischemic colitis can affect this region, however there is only minimal atherosclerosis. Infectious or  inflammatory etiologies are also considered. 2. Severe body wall edema, skin thickening, and mesenteric edema. Findings consistent with severe third-spacing. 3. Cirrhotic hepatic morphology. Moderate intra-abdominal ascites. No splenomegaly. 4. Right greater than left lung base consolidation with air bronchograms, pneumonia versus atelectasis. Trace left pleural effusion. 5. High-density material within the gallbladder and cystic duct, likely sludge, as no stones were seen on recent abdominal ultrasound. Electronically Signed   By: Rubye Oaks M.D.   On: 11/06/2015 19:06   Ct Head Wo Contrast  10/31/2015  CLINICAL DATA:  Alcohol abuse. Dyspnea requiring intubation. Altered mental status. Hypotensive post intubation requiring norepinephrine. EXAM: CT HEAD WITHOUT CONTRAST TECHNIQUE: Contiguous axial images were obtained from the base of the skull through the vertex without intravenous contrast. COMPARISON:  11/12/2013 head CT. FINDINGS: Two oral route tubes enter the upper trachea and hypopharynx on the lateral scout topogram. Mild fluid is seen layering in the nasopharynx. No evidence of parenchymal hemorrhage or extra-axial fluid collection. No mass lesion, mass effect, or midline shift. No CT evidence of acute infarction. Intracranial atherosclerosis. Nonspecific mild subcortical and periventricular white matter hypodensity, most in keeping with chronic small vessel ischemic change. Cerebral volume is age appropriate. No ventriculomegaly. The visualized paranasal sinuses are essentially clear. The mastoid air cells are unopacified. No evidence of calvarial fracture. IMPRESSION: 1.  No evidence of acute intracranial abnormality. 2. Intracranial atherosclerosis and mild chronic small vessel ischemia. Electronically Signed   By: Delbert Phenix M.D.   On: 10/31/2015 20:33   US Abdomen Complete  10/31/2015  CLINICAL DATA:  Inpatient with cirrhosis, shock and renal failure. EXAM: ABDOMEN ULTRASOUND COMPLETE  COMPARISON:  01/07/2011 CT abdomen/ pelvis. FINDINGS: Gallbladder: No gallstones or wall thickening visualized. No sonographic Murphy sign noted by sonographer. Common bile duct: Diameter: 4 mm Liver: Liver parenchyma is diffusely heterogeneous in echotexture in the liver surface is diffusely irregular in contour, in keeping with cirrhosis. There is reversal of flow in the main portal vein. No liver masses demonstrated. IVC: Not visualized. Pancreas: Not well visualized due to overlying bowel gas and other patient related factors for Spleen: Size and appearance within normal limits. Right Kidney: Length: 10.6 cm. Echogenicity within normal limits. No mass or hydronephrosis visualized. Left Kidney: Length: 11.2 cm. Echogenicity within normal limits. No mass or hydronephrosis visualized. Abdominal aorta: No aneurysm visualized. Other findings: Moderate volume ascites. IMPRESSION: 1. Cirrhosis.  No liver mass detected. 2. Reversed (hepatofugal) flow in the main portal vein. Moderate volume ascites. No splenomegaly . 3. Normal gallbladder with no cholelithiasis. No biliary ductal dilatation. Electronically Signed   By: Delbert Phenix M.D.   On: 10/31/2015 23:58   Ct Femur Left Wo Contrast  11/06/2015  CLINICAL DATA:  Chronic gastrointestinal bleeding. Swelling in the left groin and femur. Question hematoma. Initial encounter. EXAM: CT OF THE LEFT FEMUR WITHOUT CONTRAST TECHNIQUE: Multidetector CT imaging was performed according to the standard protocol. Multiplanar CT image reconstructions were also generated. COMPARISON:  None. FINDINGS: Imaging incidentally includes the right upper leg. Diffuse and intense subcutaneous edema is identified throughout. The short adductor and adductor magnus on the left are markedly swollen and hyperattenuating consistent with the presence of hematoma. Discrete measurement is  not possible but the hematoma measures approximately 6.3 cm AP by 8.1 cm transverse by 14.2 cm craniocaudal. No  other hematoma is identified. No soft tissue gas collection is seen. Partial visualization of degenerative change about the hips. IMPRESSION: Large appearing hematoma in the adductor musculature of the left upper leg cannot be discretely measured. Diffuse and intense subcutaneous edema in the lower pelvis and upper legs bilaterally. No focal bony abnormality. Electronically Signed   By: Drusilla Kanner M.D.   On: 11/06/2015 17:52   Dg Chest Port 1 View  11/06/2015  CLINICAL DATA:  Pulmonary edema, acute respiratory failure, cirrhosis, renal failure EXAM: PORTABLE CHEST 1 VIEW COMPARISON:  Portable chest x-ray of November 05, 2015 FINDINGS: The lungs are mildly hypoinflated. Parenchymal density medially at the right lung base persists. The cardiac silhouette is top-normal in size. The central pulmonary vascularity is mildly prominent. No pleural effusion or pneumothorax is observed. The left internal jugular venous catheter tip projects over the mid portion of the SVC. IMPRESSION: Mild pulmonary interstitial prominence stable likely reflecting mild interstitial edema. Confluent alveolar opacity at the right lung base medially consistent with atelectasis or pneumonia. No significant pleural effusion and no pneumothorax. Electronically Signed   By: David  [place] M.D.   On: 11/06/2015 07:20   Dg Chest Port 1 View  11/05/2015  CLINICAL DATA:  Dyspnea EXAM: PORTABLE CHEST 1 VIEW COMPARISON:  11/02/2015 FINDINGS: Left internal jugular central line again identified. Tip obscured by overlying EKG leads. Heart size and vascular pattern normal. Low lung volumes. Bilateral lower lobe opacities right worse than left. There has been mild improvement in bilateral lung base aeration. IMPRESSION: Persistent but improved bibasilar opacities. Electronically Signed   By: Esperanza Heir M.D.   On: 11/05/2015 07:11   Dg Chest Port 1 View  11/02/2015  CLINICAL DATA:  55 year old male with respiratory failure. Subsequent encounter.  EXAM: PORTABLE CHEST 1 VIEW COMPARISON:  11/01/2015. FINDINGS: Endotracheal tube tip 3.6 cm above the carina. Left central line is in place with the tip angulated possibly entering the azygos vein unchanged from prior exam. Lateral view be necessary for further delineation. No gross pneumothorax. Nasogastric tube courses below the diaphragm. Tip is not included on the present exam. Asymmetric airspace disease greatest lung bases more notable on the right may represent pulmonary edema with basilar atelectasis, pleural fluid or infiltrate. Heart size top-normal. Calcified aorta. IMPRESSION: Similar appearance of asymmetric airspace disease most notable lung bases greater on the right. Left central line tip possibly entering the azygos vein. Please see above. Electronically Signed   By: Lacy Duverney M.D.   On: 11/02/2015 08:07   Dg Chest Port 1 View  11/01/2015  CLINICAL DATA:  Central line placement. EXAM: PORTABLE CHEST 1 VIEW COMPARISON:  Yesterday. FINDINGS: Endotracheal tube in satisfactory position. Nasogastric tube extending into the stomach. Interval left jugular catheter with its tip directed superiorly. Poor inspiration. Normal sized heart. Mild increase in bibasilar opacity. Unremarkable bones. IMPRESSION: 1. Poor inspiration with increased bibasilar atelectasis. 2. Left jugular catheter tip directed superiorly. This may be in the right innominate vein, azygos vein or proximal superior vena cava. 3. No pneumothorax. Electronically Signed   By: Beckie Salts M.D.   On: 11/01/2015 07:13   Dg Chest Portable 1 View  10/31/2015  CLINICAL DATA:  55 year old male status post intubation. EXAM: PORTABLE CHEST 1 VIEW COMPARISON:  Chest x-ray 11/13/2013. FINDINGS: An endotracheal tube is in place with tip 2.8 cm above the carina. A nasogastric tube  is seen extending into the stomach, however, the tip of the nasogastric tube extends below the lower margin of the image. Lung volumes are very low. Opacity at the  right base presumably reflects atelectasis in the right middle and lower lobes. Left lung appears relatively clear with minimal subsegmental atelectasis in the left lower lobe. No definite pleural effusions. No evidence of pulmonary edema. Heart size is normal. The patient is rotated to the right on today's exam, resulting in distortion of the mediastinal contours and reduced diagnostic sensitivity and specificity for mediastinal pathology. IMPRESSION: 1. Support apparatus, as above. 2. Low lung volumes with extensive atelectasis in the right middle and lower lobes. Electronically Signed   By: Trudie Reed M.D.   On: 10/31/2015 15:47    Time Spent in minutes  35   Eddie North M.D on 11/07/2015 at 8:45 AM  Between 7am to 7pm - Pager - [phone]  After 7pm go to [email or web address] - password [facility]  [facility] -  Office  [phone]

## 2015-11-07 NOTE — Progress Notes (Signed)
[facility] Progress Note  Assessment / Plan: #1 55 yo male with severely decompensated ETOH/HEP C cirrhosis admitted 6 days agowith shock/pneumonia #2 Anasarca- wt stable past 24 hours despite paracentesis of 3 liters #3 severe persistent anemia - s/p multiple units (?12 or 13)- no overt GI bleeding- hematology feel DIC related to cirrhosis.  Also found to have large hematoma in left upper leg.  Hgb stable this AM. #4 AKI #5 AMS- resolved #6 severe coagulopathy   LOS: 7 days   ZEHR, JESSICA D.  11/07/2015, 11:02 AM  Pager number [phone]    [facility] Attending   I have taken an interval history, reviewed the chart and examined the patient. I agree with the Advanced Practitioner's note, impression and recommendations.   A this point needs supportive care and diuretic Tx of vol overload when appropriate. The thickened colon on CT was likely from edema/ascites and I do not think it requires w/u.  He can f/u Dr. Rhea Belton re: cirrhosis at dc We are signing off now - I will leave a message re: GI appt after dc with our office and hopefully this will be in place before dc I am anticipating about a 4 week GI f/u - PCP sooner  Iva Boop, MD, [facility] [phone] (pager) [phone] after 5 PM, weekends and holidays  11/07/2015 12:54 PM    Subjective:  Feels ok, just tired.  CT femur showed large hematoma in the musculature of the left upper leg.  No RP bleed on CT abdomen and pelvis.   Objective:  Vital signs in last 24 hours: Temp:  [98.4 F (36.9 C)-99.9 F (37.7 C)] 98.4 F (36.9 C) (04/22 0935) Pulse Rate:  [95-113] 105 (04/22 0935) Resp:  [0-20] 18 (04/22 0935) BP: (115-155)/(64-115) 122/68 mmHg (04/22 0935) SpO2:  [95 %-100 %] 100 % (04/22 0935) Weight:  [253 lb 6.4 oz (114.941 kg)] 253 lb 6.4 oz (114.941 kg) (04/22 0618) Last BM Date: 11/06/15 General:  Alert, Well-developed, in NAD Heart:  Regular rate and rhythm; no  murmurs Pulm:  CTAB.  No W/R/R. Abdomen:  Tense ascites noted.  BS present.  Non-tender.  Extremities:  Significant LE edema noted. And hematoma left thigh Neurologic:  Alert and  oriented x4;  grossly normal neurologically. Psych:  Alert and cooperative. Normal mood and affect.  Intake/Output from previous day: 04/21 0701 - 04/22 0700 In: 1722.7 [P.O.:700; I.V.:76.7; Blood:746; IV Piggyback:200] Out: 2250 [Urine:2250]  Lab Results:  Recent Labs  11/05/15 1719 11/06/15 0515 11/06/15 1109 11/07/15 0520  WBC 8.0 7.9  --  9.6  HGB 7.1* 6.9* 7.5* 7.1*  HCT 20.7* 20.0* 21.5* 20.9*  PLT 62* 67*  --  109*   BMET  Recent Labs  11/05/15 0552 11/06/15 0515 11/07/15 0520  NA 135 135 135  K 3.0* 2.9* 3.1*  CL 105 106 105  CO2 18* 20* 21*  GLUCOSE 132* 112* 131*  BUN 37* 36* 34*  CREATININE 2.32* 2.13* 1.79*  CALCIUM 7.4* 7.8* 7.8*   LFT  Recent Labs  11/06/15 1452 11/07/15 0520  PROT  --  6.4*  ALBUMIN  --  1.9*  AST  --  85*  ALT  --  58  ALKPHOS  --  66  BILITOT 8.8* 10.0*  BILIDIR 2.9*  --    PT/INR  Recent Labs  11/05/15 0552  LABPROT 27.8*  INR 2.63*   Ct Abdomen Pelvis Wo Contrast  11/06/2015  CLINICAL DATA:  Edema. Decreasing  hemoglobin. Cirrhotic patient with hemoccult positive stool. EXAM: CT ABDOMEN AND PELVIS WITHOUT CONTRAST TECHNIQUE: Multidetector CT imaging of the abdomen and pelvis was performed following the standard protocol without IV contrast. COMPARISON:  Abdominal ultrasound 10/31/2015 FINDINGS: Lower chest: Right lung base consolidation with air bronchograms. Minimal left basilar opacity. Small subpulmonic left pleural effusion. Mild cardiomegaly. Decreased density of blood pool consistent with anemia. Liver: Nodular contours with cirrhotic morphology. No focal lesion detected on this unenhanced exam. Hepatobiliary: Gallbladder physiologically distended. There is high-density material within the dependent gallbladder and cystic duct. Common  bile duct is not well-defined. Pancreas: No ductal dilatation. Spleen: Normal in size, maximal mention 12.8 cm. Adrenal glands: No nodule. Kidneys: No hydronephrosis or urolithiasis. Stomach/Bowel: Ingested contents in the stomach. Small hiatal hernia. Limited bowel assessment given intra-abdominal ascites. No large or small bowel obstruction. There is questionable colonic wall thickening at the splenic flexure of the colon. Formed stool in the ascending colon. Appendix tentatively but not definitively identified. Vascular/Lymphatic: Normal caliber abdominal aorta with trace atherosclerosis. No retroperitoneal adenopathy. Limited assessment for intra-abdominal adenopathy given mesenteric edema. Reproductive: Prostate gland normal in size. Bladder: Decompressed by Foley catheter. Other: Severe whole body wall edema and skin thickening. Moderate intra-abdominal ascites. Diffuse mesenteric edema and small volume mesenteric free fluid. No free air. Musculoskeletal: There are no acute or suspicious osseous abnormalities. Degenerative change involving the left greater than right hip and throughout the spine. IMPRESSION: 1. Probable colonic wall thickening at the splenic flexure of the colon, limited bowel evaluation given lack of enteric contrast and intra-abdominal ascites. This may be reactive or colitis. Ischemic colitis can affect this region, however there is only minimal atherosclerosis. Infectious or inflammatory etiologies are also considered. 2. Severe body wall edema, skin thickening, and mesenteric edema. Findings consistent with severe third-spacing. 3. Cirrhotic hepatic morphology. Moderate intra-abdominal ascites. No splenomegaly. 4. Right greater than left lung base consolidation with air bronchograms, pneumonia versus atelectasis. Trace left pleural effusion. 5. High-density material within the gallbladder and cystic duct, likely sludge, as no stones were seen on recent abdominal ultrasound. Electronically  Signed   By: Rubye Oaks M.D.   On: 11/06/2015 19:06   Ct Femur Left Wo Contrast  11/06/2015  CLINICAL DATA:  Chronic gastrointestinal bleeding. Swelling in the left groin and femur. Question hematoma. Initial encounter. EXAM: CT OF THE LEFT FEMUR WITHOUT CONTRAST TECHNIQUE: Multidetector CT imaging was performed according to the standard protocol. Multiplanar CT image reconstructions were also generated. COMPARISON:  None. FINDINGS: Imaging incidentally includes the right upper leg. Diffuse and intense subcutaneous edema is identified throughout. The short adductor and adductor magnus on the left are markedly swollen and hyperattenuating consistent with the presence of hematoma. Discrete measurement is not possible but the hematoma measures approximately 6.3 cm AP by 8.1 cm transverse by 14.2 cm craniocaudal. No other hematoma is identified. No soft tissue gas collection is seen. Partial visualization of degenerative change about the hips. IMPRESSION: Large appearing hematoma in the adductor musculature of the left upper leg cannot be discretely measured. Diffuse and intense subcutaneous edema in the lower pelvis and upper legs bilaterally. No focal bony abnormality. Electronically Signed   By: Drusilla Kanner M.D.   On: 11/06/2015 17:52   Dg Chest Port 1 View  11/06/2015  CLINICAL DATA:  Pulmonary edema, acute respiratory failure, cirrhosis, renal failure EXAM: PORTABLE CHEST 1 VIEW COMPARISON:  Portable chest x-ray of November 05, 2015 FINDINGS: The lungs are mildly hypoinflated. Parenchymal density medially at the right lung  base persists. The cardiac silhouette is top-normal in size. The central pulmonary vascularity is mildly prominent. No pleural effusion or pneumothorax is observed. The left internal jugular venous catheter tip projects over the mid portion of the SVC. IMPRESSION: Mild pulmonary interstitial prominence stable likely reflecting mild interstitial edema. Confluent alveolar opacity at  the right lung base medially consistent with atelectasis or pneumonia. No significant pleural effusion and no pneumothorax. Electronically Signed   By: David  [place] M.D.   On: 11/06/2015 07:20

## 2015-11-08 LAB — CBC
HEMATOCRIT: 21.2 % — AB (ref 39.0–52.0)
HEMOGLOBIN: 7.1 g/dL — AB (ref 13.0–17.0)
MCH: 28.1 pg (ref 26.0–34.0)
MCHC: 33.5 g/dL (ref 30.0–36.0)
MCV: 83.8 fL (ref 78.0–100.0)
Platelets: 123 10*3/uL — ABNORMAL LOW (ref 150–400)
RBC: 2.53 MIL/uL — ABNORMAL LOW (ref 4.22–5.81)
RDW: 16.7 % — ABNORMAL HIGH (ref 11.5–15.5)
WBC: 11.7 10*3/uL — AB (ref 4.0–10.5)

## 2015-11-08 LAB — PREPARE CRYOPRECIPITATE: UNIT DIVISION: 0

## 2015-11-08 LAB — TYPE AND SCREEN
ABO/RH(D): O POS
Antibody Screen: NEGATIVE
UNIT DIVISION: 0
UNIT DIVISION: 0
Unit division: 0
Unit division: 0
Unit division: 0

## 2015-11-08 LAB — BASIC METABOLIC PANEL
Anion gap: 12 (ref 5–15)
BUN: 33 mg/dL — AB (ref 6–20)
CHLORIDE: 102 mmol/L (ref 101–111)
CO2: 21 mmol/L — AB (ref 22–32)
CREATININE: 1.6 mg/dL — AB (ref 0.61–1.24)
Calcium: 8.1 mg/dL — ABNORMAL LOW (ref 8.9–10.3)
GFR calc non Af Amer: 48 mL/min — ABNORMAL LOW (ref >60)
GFR, EST AFRICAN AMERICAN: 55 mL/min — AB (ref >60)
Glucose, Bld: 93 mg/dL (ref 65–99)
POTASSIUM: 3 mmol/L — AB (ref 3.5–5.1)
SODIUM: 135 mmol/L (ref 135–145)

## 2015-11-08 LAB — GLUCOSE, CAPILLARY
GLUCOSE-CAPILLARY: 112 mg/dL — AB (ref 65–99)
GLUCOSE-CAPILLARY: 129 mg/dL — AB (ref 65–99)
GLUCOSE-CAPILLARY: 141 mg/dL — AB (ref 65–99)
GLUCOSE-CAPILLARY: 97 mg/dL (ref 65–99)
Glucose-Capillary: 99 mg/dL (ref 65–99)
Glucose-Capillary: 94 mg/dL (ref 65–99)

## 2015-11-08 LAB — MAGNESIUM: MAGNESIUM: 1.3 mg/dL — AB (ref 1.7–2.4)

## 2015-11-08 MED ORDER — POTASSIUM CHLORIDE CRYS ER 20 MEQ PO TBCR
40.0000 meq | EXTENDED_RELEASE_TABLET | ORAL | Status: AC
  Administered 2015-11-08 (×2): 40 meq via ORAL
  Filled 2015-11-08 (×2): qty 2

## 2015-11-08 MED ORDER — MAGNESIUM SULFATE 4 GM/100ML IV SOLN
4.0000 g | Freq: Once | INTRAVENOUS | Status: AC
  Administered 2015-11-08: 4 g via INTRAVENOUS
  Filled 2015-11-08: qty 100

## 2015-11-08 MED ORDER — POTASSIUM CHLORIDE CRYS ER 10 MEQ PO TBCR: 30.0000 meq | EXTENDED_RELEASE_TABLET | Freq: Two times a day (BID) | ORAL | Status: DC

## 2015-11-08 NOTE — Progress Notes (Signed)
PROGRESS NOTE                                                                                                                                                                                                             Patient Demographics:    [name], is a 55 y.o. male, DOB - 1961/06/09, ZOX:[id number]  Admit date - 10/31/2015   Admitting Physician Leslye Peer, MD  Outpatient Primary MD for the patient is [name], [name]  LOS - 8  Outpatient Specialists: None  Chief Complaint  Patient presents with  . Shortness of Breath       Brief Narrative   55 year old male with history of alcoholic cirrhosis, hypertension, OSA (not on CPAP) who was brought to the ED for dyspnea and quickly became obtunded. He was urgently intubated in the ED. Postintubation became hypotensive and was started on norepinephrine. Labs showed severe anemia with hemoglobin of 2.7 and some cytopenia. Was transferred with several units of PRBC and FFP. No source of bleeding noted. He was found to have severely elevated lactic acid of 14 with acute kidney injury and elevated INR of 4.35. He was started on empiric vancomycin and cefepime and then switch to Zosyn. Patient also developed SVT on 4/16 and was started on amiodarone drip. Pressures were discontinued and patient extubated on 4/17. On 4/19 patient had therapeutic paracentesis with 3.3 L yellow fluid removed for increasing ascites and respiratory distress. Transferred to  hospitalist service on 4/21. CT abdomen and pelvis done on 4/21 showed large hematoma in the left upper thigh.     Subjective:   Still has abdominal distention. Normal wheezing.   Assessment  & Plan :   Principal problem Hypovolemic shock  due to severe anemia. Received several units of PRBC this hospitalization.  Continue Protonix. Transfuse If hb <7. SBP negative .    Active Problems: Acute hypoxic  respiratory failure with shock Secondary to hypovolemic shock, metabolic acidosis and possible pneumonia and pulmonary edema. No signs of respiratory distress and maintaining O2 sat on room air. Significant positive balance with pulmonary edema. On aggressive diuresis with IV Lasix 80 mg every 6 hours. Monitor strict I/O. Scheduled nebs. Continue bedside spirometry.  Decompensated alcohol excess hep C cirrhosis with anasarca and acute encephalopathy Therapeutic paracentesis done on 4/17 with 3.3 L removed and placed on  aggressive IV Lasix.  attempted paracentesis on 4/21 without fluid yield. abdominal distension is likely from severe third spacing seen on CT. Encephalopathy resolved. Aggressive diuresis for anasarca. When necessary IV albumin.   Severe Anemia and thrombocytopenia ? DIC with cirrhosis. Also found t have large left thigh hematoma on CT which is contribution. monitor with transfuse prn.Hemoccult-positive. elevated LDH and fibrinogen. Hematology consulted , recommended severe anemia and some cytopenia is contributed by bone marrow suppression as well. Elevated LDH and haptoglobin difficult to interpret in a cirrhotic patient. Recommend supportive care. Platelets slowly improving.  Appreciate help from GI. Have signed off with plan on outpatient follow-up.  Sinus tachycardia with SVT versus A. fib Required amiodarone drip in the ICU. Now with better control. Switched to by mouth. K >4 and mag >2.  2-D echo shows EF of 50-50% without wall motion abnormality.  Acute kidney injury Possibly due to hypovolemic shock, now with cirrhosis and pulmonary edema. Getting aggressive diuresis with Lasix. Renal function slowly improving.    Shock Likely hypovolemic with acute blood loss and ? DIC. No signs of SBP. Cultures negative. Concern for pneumonia on admission and has received empiric antibiotics. Lactic acid resolved.  Hypoglycemia Possibly due to shock and poor by mouth intake.  Resolved.  Hypokalemia / Hypomagnesemia Replenished. Placed on daily supplement.  ? Colitis on CT abdomen Colonic wall thickening noted. GI recommend this is more edematous change and does not need intervention.     Code Status : DO NOT RESUSCITATE  Family Communication  : None at bedside. Will update daughter  Disposition Plan  : PT recommend skilled nursing facility. Possibly some time next week once improved  Barriers For Discharge : Ongoing anasarca, respiratory failure and ascites, persistent anemia requiring transfusion  Consults  :   PC CM (signed off was then [facility] (signed off) hematology ( Dr Pamelia HoitGudena)  Procedures  :  Intubation 2-D echo CT head, ultrasound abdomen Therapeutic paracentesis CT abdomen  and pelvis, CT pelvis  DVT Prophylaxis  :  SCD's  Lab Results  Component Value Date   PLT 123* 11/08/2015    Antibiotics  :  Completed  Anti-infectives    Start     Dose/Rate Route Frequency Ordered Stop   11/04/15 1400  piperacillin-tazobactam (ZOSYN) IVPB 3.375 g  Status:  Discontinued     3.375 g 12.5 mL/hr over 240 Minutes Intravenous Every 8 hours 11/04/15 1059 11/05/15 1021   11/04/15 1130  metroNIDAZOLE (FLAGYL) IVPB 500 mg  Status:  Discontinued     500 mg 100 mL/hr over 60 Minutes Intravenous Every 8 hours 11/04/15 1032 11/04/15 1059   11/01/15 1600  ceFEPIme (MAXIPIME) 2 g in dextrose 5 % 50 mL IVPB  Status:  Discontinued     2 g 100 mL/hr over 30 Minutes Intravenous Every 24 hours 10/31/15 1715 11/04/15 1059   11/01/15 0400  vancomycin (VANCOCIN) IVPB 750 mg/150 ml premix  Status:  Discontinued     750 mg 150 mL/hr over 60 Minutes Intravenous Every 12 hours 10/31/15 1715 11/02/15 1206   10/31/15 1600  ceFEPIme (MAXIPIME) 2 g in dextrose 5 % 50 mL IVPB     2 g 100 mL/hr over 30 Minutes Intravenous  Once 10/31/15 1521 10/31/15 1650   10/31/15 1600  vancomycin (VANCOCIN) 1,750 mg in sodium chloride 0.9 % 500 mL IVPB     1,750 mg 250 mL/hr  over 120 Minutes Intravenous NOW 10/31/15 1521 10/31/15 1814  Objective:   Filed Vitals:   11/07/15 0935 11/07/15 1711 11/07/15 1950 11/08/15 0851  BP: 122/68 123/77 124/78 131/83  Pulse: 105 101 103 103  Temp: 98.4 F (36.9 C) 100.3 F (37.9 C) 99.6 F (37.6 C) 99.5 F (37.5 C)  TempSrc: Oral Oral Oral Oral  Resp: 18 18 18 18   Height:      Weight:      SpO2: 100% 100% 99% 97%    Wt Readings from Last 3 Encounters:  11/07/15 114.941 kg (253 lb 6.4 oz)  11/28/14 89.268 kg (196 lb 12.8 oz)  11/14/14 90.901 kg (200 lb 6.4 oz)     Intake/Output Summary (Last 24 hours) at 11/08/15 1421 Last data filed at 11/08/15 1300  Gross per 24 hour  Intake    740 ml  Output   2300 ml  Net  -1560 ml     Physical Exam  Gen: M not in distress HEENT: Pallor present, no icterus,, moist mucosa, supple neck,  Chest: Diminished bilateral breath sounds,  No wheezing CVS: N S1&S2, no murmurs, rubs or gallop GI: Distended with ascites, nontender, foley + Musculoskeletal: warm, 2+ pitting edema bilaterally with anasarca, , scrotal edema, erythema over left anterior thigh ( from hematoma) CNS: Alert and oriented, no tremors    Data Review:    CBC  Recent Labs Lab 11/03/15 2245  11/04/15 1805 11/05/15 0745 11/05/15 1719 11/06/15 0515 11/06/15 1109 11/07/15 0520 11/08/15 0636  WBC 8.1  < > 10.1 7.5 8.0 7.9  --  9.6 11.7*  HGB 5.7*  < > 7.2* 6.2* 7.1* 6.9* 7.5* 7.1* 7.1*  HCT 16.8*  < > 20.2* 18.0* 20.7* 20.0* 21.5* 20.9* 21.2*  PLT 63*  < > 66* 55* 62* 67*  --  109* 123*  MCV 84.0  < > 83.5 82.2 83.1 82.0  --  83.6 83.8  MCH 28.5  < > 29.8 28.3 28.5 28.3  --  28.4 28.1  MCHC 33.9  < > 35.6 34.4 34.3 34.5  --  34.0 33.5  RDW 16.9*  < > 16.2* 16.2* 15.9* 16.0*  --  16.2* 16.7*  LYMPHSABS 0.7  --  0.8 0.6*  --   --   --   --   --   MONOABS 1.8*  --  3.1* 2.2*  --   --   --   --   --   EOSABS 0.1  --  0.2 0.3  --   --   --   --   --   BASOSABS 0.0  --  0.0 0.0  --   --    --   --   --   < > = values in this interval not displayed.  Chemistries   Recent Labs Lab 11/02/15 0300 11/03/15 0442 11/04/15 0623 11/04/15 1805 11/05/15 0552 11/06/15 0515 11/06/15 1452 11/07/15 0520 11/08/15 0636  NA 143 139 136 136 135 135  --  135 135  K 3.5 3.1* 3.0* 3.0* 3.0* 2.9*  --  3.1* 3.0*  CL 112* 108 108 107 105 106  --  105 102  CO2 23 20* 19* 18* 18* 20*  --  21* 21*  GLUCOSE 133* 125* 125* 141* 132* 112*  --  131* 93  BUN 35* 34* 38* 39* 37* 36*  --  34* 33*  CREATININE 2.13* 2.22* 2.52* 2.58* 2.32* 2.13*  --  1.79* 1.60*  CALCIUM 7.0* 6.9* 7.3* 7.4* 7.4* 7.8*  --  7.8* 8.1*  MG 1.9  1.6* 1.5* 1.7  --   --   --  1.0* 1.3*  AST 431*  --   --   --  144* 109*  --  85*  --   ALT 101*  --   --   --  73* 69*  --  58  --   ALKPHOS 61  --   --   --  59 66  --  66  --   BILITOT 5.1*  --   --   --  6.8* 8.0* 8.8* 10.0*  --    ------------------------------------------------------------------------------------------------------------------ No results for input(s): CHOL, HDL, LDLCALC, TRIG, CHOLHDL, LDLDIRECT in the last 72 hours.  No results found for: HGBA1C ------------------------------------------------------------------------------------------------------------------ No results for input(s): TSH, T4TOTAL, T3FREE, THYROIDAB in the last 72 hours.  Invalid input(s): FREET3 ------------------------------------------------------------------------------------------------------------------ No results for input(s): VITAMINB12, FOLATE, FERRITIN, TIBC, IRON, RETICCTPCT in the last 72 hours.  Coagulation profile  Recent Labs Lab 11/02/15 0300 11/03/15 1040 11/04/15 0623 11/05/15 0552  INR 3.24* 3.73* 2.77* 2.63*    No results for input(s): DDIMER in the last 72 hours.  Cardiac Enzymes  Recent Labs Lab 11/01/15 1742 11/02/15 0300 11/02/15 0850  TROPONINI 0.05* 0.05* 0.04*    ------------------------------------------------------------------------------------------------------------------    Component Value Date/Time   BNP 694.4* 10/31/2015 1911    Inpatient Medications  Scheduled Meds: . sodium chloride   Intravenous Once  . sodium chloride   Intravenous Once  . sodium chloride   Intravenous Once  . sodium chloride   Intravenous Once  . sodium chloride   Intravenous Once  . sodium chloride   Intravenous Once  . sodium chloride   Intravenous Once  . sodium chloride   Intravenous Once  . amiodarone  200 mg Oral Daily  . azelastine  2 spray Each Nare BID  . folic acid  1 mg Oral Daily  . furosemide  80 mg Intravenous Q6H  . insulin aspart  0-9 Units Subcutaneous 6 times per day  . pantoprazole  40 mg Oral Daily  . pneumococcal 23 valent vaccine  0.5 mL Intramuscular Tomorrow-1000  . [START ON 11/09/2015] potassium chloride  30 mEq Oral BID  . thiamine  100 mg Oral Daily   Continuous Infusions:  PRN Meds:.albuterol, loratadine, sodium chloride flush, sodium chloride flush  Micro Results Recent Results (from the past 240 hour(s))  Blood culture (routine x 2)     Status: None   Collection Time: 10/31/15  3:30 PM  Result Value Ref Range Status   Specimen Description BLOOD RIGHT HAND  Final   Special Requests IN PEDIATRIC BOTTLE  Final   Culture NO GROWTH 5 DAYS  Final   Report Status 11/05/2015 FINAL  Final  Blood culture (routine x 2)     Status: None   Collection Time: 10/31/15  3:56 PM  Result Value Ref Range Status   Specimen Description BLOOD RIGHT HAND  Final   Special Requests BOTTLES DRAWN AEROBIC AND ANAEROBIC 5CC  Final   Culture NO GROWTH 5 DAYS  Final   Report Status 11/05/2015 FINAL  Final  Urine culture     Status: None   Collection Time: 10/31/15  7:14 PM  Result Value Ref Range Status   Specimen Description URINE, CATHETERIZED  Final   Special Requests NONE  Final   Culture NO GROWTH 2 DAYS  Final   Report Status  11/02/2015 FINAL  Final  MRSA PCR Screening     Status: None   Collection Time: 10/31/15  8:45 PM  Result Value Ref Range Status   MRSA by PCR NEGATIVE NEGATIVE Final    Comment:        The GeneXpert MRSA Assay (FDA approved for NASAL specimens only), is one component of a comprehensive MRSA colonization surveillance program. It is not intended to diagnose MRSA infection nor to guide or monitor treatment for MRSA infections.   Culture, respiratory (NON-Expectorated)     Status: None   Collection Time: 11/01/15 12:22 PM  Result Value Ref Range Status   Specimen Description SPUTUM  Final   Special Requests NONE  Final   Gram Stain   Final    ABUNDANT WBC PRESENT, PREDOMINANTLY PMN FEW SQUAMOUS EPITHELIAL CELLS PRESENT FEW GRAM POSITIVE COCCI IN PAIRS FEW GRAM POSITIVE RODS THIS SPECIMEN IS ACCEPTABLE FOR SPUTUM CULTURE Performed at [facility]    Culture   Final    NORMAL OROPHARYNGEAL FLORA Performed at [facility]    Report Status 11/03/2015 FINAL  Final  Culture, body fluid-bottle     Status: None (Preliminary result)   Collection Time: 11/04/15  1:04 PM  Result Value Ref Range Status   Specimen Description FLUID PERITONEAL  Final   Special Requests NONE  Final   Culture NO GROWTH 4 DAYS  Final   Report Status PENDING  Incomplete  Gram stain     Status: None   Collection Time: 11/04/15  1:04 PM  Result Value Ref Range Status   Specimen Description FLUID PERITONEAL  Final   Special Requests NONE  Final   Gram Stain   Final    FEW WBC PRESENT,BOTH PMN AND MONONUCLEAR NO ORGANISMS SEEN    Report Status 11/04/2015 FINAL  Final    Radiology Reports Ct Abdomen Pelvis Wo Contrast  11/06/2015  CLINICAL DATA:  Edema. Decreasing hemoglobin. Cirrhotic patient with hemoccult positive stool. EXAM: CT ABDOMEN AND PELVIS WITHOUT CONTRAST TECHNIQUE: Multidetector CT imaging of the abdomen and pelvis was performed following the standard protocol without IV  contrast. COMPARISON:  Abdominal ultrasound 10/31/2015 FINDINGS: Lower chest: Right lung base consolidation with air bronchograms. Minimal left basilar opacity. Small subpulmonic left pleural effusion. Mild cardiomegaly. Decreased density of blood pool consistent with anemia. Liver: Nodular contours with cirrhotic morphology. No focal lesion detected on this unenhanced exam. Hepatobiliary: Gallbladder physiologically distended. There is high-density material within the dependent gallbladder and cystic duct. Common bile duct is not well-defined. Pancreas: No ductal dilatation. Spleen: Normal in size, maximal mention 12.8 cm. Adrenal glands: No nodule. Kidneys: No hydronephrosis or urolithiasis. Stomach/Bowel: Ingested contents in the stomach. Small hiatal hernia. Limited bowel assessment given intra-abdominal ascites. No large or small bowel obstruction. There is questionable colonic wall thickening at the splenic flexure of the colon. Formed stool in the ascending colon. Appendix tentatively but not definitively identified. Vascular/Lymphatic: Normal caliber abdominal aorta with trace atherosclerosis. No retroperitoneal adenopathy. Limited assessment for intra-abdominal adenopathy given mesenteric edema. Reproductive: Prostate gland normal in size. Bladder: Decompressed by Foley catheter. Other: Severe whole body wall edema and skin thickening. Moderate intra-abdominal ascites. Diffuse mesenteric edema and small volume mesenteric free fluid. No free air. Musculoskeletal: There are no acute or suspicious osseous abnormalities. Degenerative change involving the left greater than right hip and throughout the spine. IMPRESSION: 1. Probable colonic wall thickening at the splenic flexure of the colon, limited bowel evaluation given lack of enteric contrast and intra-abdominal ascites. This may be reactive or colitis. Ischemic colitis can affect this region, however there is only minimal atherosclerosis. Infectious  or  inflammatory etiologies are also considered. 2. Severe body wall edema, skin thickening, and mesenteric edema. Findings consistent with severe third-spacing. 3. Cirrhotic hepatic morphology. Moderate intra-abdominal ascites. No splenomegaly. 4. Right greater than left lung base consolidation with air bronchograms, pneumonia versus atelectasis. Trace left pleural effusion. 5. High-density material within the gallbladder and cystic duct, likely sludge, as no stones were seen on recent abdominal ultrasound. Electronically Signed   By: Rubye Oaks M.D.   On: 11/06/2015 19:06   Ct Head Wo Contrast  10/31/2015  CLINICAL DATA:  Alcohol abuse. Dyspnea requiring intubation. Altered mental status. Hypotensive post intubation requiring norepinephrine. EXAM: CT HEAD WITHOUT CONTRAST TECHNIQUE: Contiguous axial images were obtained from the base of the skull through the vertex without intravenous contrast. COMPARISON:  11/12/2013 head CT. FINDINGS: Two oral route tubes enter the upper trachea and hypopharynx on the lateral scout topogram. Mild fluid is seen layering in the nasopharynx. No evidence of parenchymal hemorrhage or extra-axial fluid collection. No mass lesion, mass effect, or midline shift. No CT evidence of acute infarction. Intracranial atherosclerosis. Nonspecific mild subcortical and periventricular white matter hypodensity, most in keeping with chronic small vessel ischemic change. Cerebral volume is age appropriate. No ventriculomegaly. The visualized paranasal sinuses are essentially clear. The mastoid air cells are unopacified. No evidence of calvarial fracture. IMPRESSION: 1.  No evidence of acute intracranial abnormality. 2. Intracranial atherosclerosis and mild chronic small vessel ischemia. Electronically Signed   By: Delbert Phenix M.D.   On: 10/31/2015 20:33   US Abdomen Complete  10/31/2015  CLINICAL DATA:  Inpatient with cirrhosis, shock and renal failure. EXAM: ABDOMEN ULTRASOUND COMPLETE  COMPARISON:  01/07/2011 CT abdomen/ pelvis. FINDINGS: Gallbladder: No gallstones or wall thickening visualized. No sonographic Murphy sign noted by sonographer. Common bile duct: Diameter: 4 mm Liver: Liver parenchyma is diffusely heterogeneous in echotexture in the liver surface is diffusely irregular in contour, in keeping with cirrhosis. There is reversal of flow in the main portal vein. No liver masses demonstrated. IVC: Not visualized. Pancreas: Not well visualized due to overlying bowel gas and other patient related factors for Spleen: Size and appearance within normal limits. Right Kidney: Length: 10.6 cm. Echogenicity within normal limits. No mass or hydronephrosis visualized. Left Kidney: Length: 11.2 cm. Echogenicity within normal limits. No mass or hydronephrosis visualized. Abdominal aorta: No aneurysm visualized. Other findings: Moderate volume ascites. IMPRESSION: 1. Cirrhosis.  No liver mass detected. 2. Reversed (hepatofugal) flow in the main portal vein. Moderate volume ascites. No splenomegaly . 3. Normal gallbladder with no cholelithiasis. No biliary ductal dilatation. Electronically Signed   By: Delbert Phenix M.D.   On: 10/31/2015 23:58   Ct Femur Left Wo Contrast  11/06/2015  CLINICAL DATA:  Chronic gastrointestinal bleeding. Swelling in the left groin and femur. Question hematoma. Initial encounter. EXAM: CT OF THE LEFT FEMUR WITHOUT CONTRAST TECHNIQUE: Multidetector CT imaging was performed according to the standard protocol. Multiplanar CT image reconstructions were also generated. COMPARISON:  None. FINDINGS: Imaging incidentally includes the right upper leg. Diffuse and intense subcutaneous edema is identified throughout. The short adductor and adductor magnus on the left are markedly swollen and hyperattenuating consistent with the presence of hematoma. Discrete measurement is not possible but the hematoma measures approximately 6.3 cm AP by 8.1 cm transverse by 14.2 cm craniocaudal. No  other hematoma is identified. No soft tissue gas collection is seen. Partial visualization of degenerative change about the hips. IMPRESSION: Large appearing hematoma in the adductor musculature of the left upper  leg cannot be discretely measured. Diffuse and intense subcutaneous edema in the lower pelvis and upper legs bilaterally. No focal bony abnormality. Electronically Signed   By: Drusilla Kanner M.D.   On: 11/06/2015 17:52   Dg Chest Port 1 View  11/06/2015  CLINICAL DATA:  Pulmonary edema, acute respiratory failure, cirrhosis, renal failure EXAM: PORTABLE CHEST 1 VIEW COMPARISON:  Portable chest x-ray of November 05, 2015 FINDINGS: The lungs are mildly hypoinflated. Parenchymal density medially at the right lung base persists. The cardiac silhouette is top-normal in size. The central pulmonary vascularity is mildly prominent. No pleural effusion or pneumothorax is observed. The left internal jugular venous catheter tip projects over the mid portion of the SVC. IMPRESSION: Mild pulmonary interstitial prominence stable likely reflecting mild interstitial edema. Confluent alveolar opacity at the right lung base medially consistent with atelectasis or pneumonia. No significant pleural effusion and no pneumothorax. Electronically Signed   By: David  [place] M.D.   On: 11/06/2015 07:20   Dg Chest Port 1 View  11/05/2015  CLINICAL DATA:  Dyspnea EXAM: PORTABLE CHEST 1 VIEW COMPARISON:  11/02/2015 FINDINGS: Left internal jugular central line again identified. Tip obscured by overlying EKG leads. Heart size and vascular pattern normal. Low lung volumes. Bilateral lower lobe opacities right worse than left. There has been mild improvement in bilateral lung base aeration. IMPRESSION: Persistent but improved bibasilar opacities. Electronically Signed   By: Esperanza Heir M.D.   On: 11/05/2015 07:11   Dg Chest Port 1 View  11/02/2015  CLINICAL DATA:  55 year old male with respiratory failure. Subsequent encounter.  EXAM: PORTABLE CHEST 1 VIEW COMPARISON:  11/01/2015. FINDINGS: Endotracheal tube tip 3.6 cm above the carina. Left central line is in place with the tip angulated possibly entering the azygos vein unchanged from prior exam. Lateral view be necessary for further delineation. No gross pneumothorax. Nasogastric tube courses below the diaphragm. Tip is not included on the present exam. Asymmetric airspace disease greatest lung bases more notable on the right may represent pulmonary edema with basilar atelectasis, pleural fluid or infiltrate. Heart size top-normal. Calcified aorta. IMPRESSION: Similar appearance of asymmetric airspace disease most notable lung bases greater on the right. Left central line tip possibly entering the azygos vein. Please see above. Electronically Signed   By: Lacy Duverney M.D.   On: 11/02/2015 08:07   Dg Chest Port 1 View  11/01/2015  CLINICAL DATA:  Central line placement. EXAM: PORTABLE CHEST 1 VIEW COMPARISON:  Yesterday. FINDINGS: Endotracheal tube in satisfactory position. Nasogastric tube extending into the stomach. Interval left jugular catheter with its tip directed superiorly. Poor inspiration. Normal sized heart. Mild increase in bibasilar opacity. Unremarkable bones. IMPRESSION: 1. Poor inspiration with increased bibasilar atelectasis. 2. Left jugular catheter tip directed superiorly. This may be in the right innominate vein, azygos vein or proximal superior vena cava. 3. No pneumothorax. Electronically Signed   By: Beckie Salts M.D.   On: 11/01/2015 07:13   Dg Chest Portable 1 View  10/31/2015  CLINICAL DATA:  56 year old male status post intubation. EXAM: PORTABLE CHEST 1 VIEW COMPARISON:  Chest x-ray 11/13/2013. FINDINGS: An endotracheal tube is in place with tip 2.8 cm above the carina. A nasogastric tube is seen extending into the stomach, however, the tip of the nasogastric tube extends below the lower margin of the image. Lung volumes are very low. Opacity at the  right base presumably reflects atelectasis in the right middle and lower lobes. Left lung appears relatively clear with minimal subsegmental atelectasis  in the left lower lobe. No definite pleural effusions. No evidence of pulmonary edema. Heart size is normal. The patient is rotated to the right on today's exam, resulting in distortion of the mediastinal contours and reduced diagnostic sensitivity and specificity for mediastinal pathology. IMPRESSION: 1. Support apparatus, as above. 2. Low lung volumes with extensive atelectasis in the right middle and lower lobes. Electronically Signed   By: Trudie Reed M.D.   On: 10/31/2015 15:47    Time Spent in minutes  25   Eddie North M.D on 11/08/2015 at 2:21 PM  Between 7am to 7pm - Pager - [phone]  After 7pm go to [email or web address] - password [facility]  [facility] -  Office  [phone]

## 2015-11-09 ENCOUNTER — Inpatient Hospital Stay (HOSPITAL_COMMUNITY): Payer: Federal, State, Local not specified - PPO

## 2015-11-09 DIAGNOSIS — S7012XD Contusion of left thigh, subsequent encounter: Secondary | ICD-10-CM

## 2015-11-09 DIAGNOSIS — Z7189 Other specified counseling: Secondary | ICD-10-CM

## 2015-11-09 LAB — MAGNESIUM: MAGNESIUM: 1.3 mg/dL — AB (ref 1.7–2.4)

## 2015-11-09 LAB — GLUCOSE, CAPILLARY
GLUCOSE-CAPILLARY: 117 mg/dL — AB (ref 65–99)
GLUCOSE-CAPILLARY: 124 mg/dL — AB (ref 65–99)
GLUCOSE-CAPILLARY: 98 mg/dL (ref 65–99)
GLUCOSE-CAPILLARY: 133 mg/dL — AB (ref 65–99)
GLUCOSE-CAPILLARY: 115 mg/dL — AB (ref 65–99)
GLUCOSE-CAPILLARY: 100 mg/dL — AB (ref 65–99)

## 2015-11-09 LAB — BODY FLUID CELL COUNT WITH DIFFERENTIAL
EOS FL: 1 %
Lymphs, Fluid: 30 %
Monocyte-Macrophage-Serous Fluid: 62 % (ref 50–90)
Neutrophil Count, Fluid: 7 % (ref 0–25)
WBC FLUID: 18 uL (ref 0–1000)

## 2015-11-09 LAB — BASIC METABOLIC PANEL
Anion gap: 12 (ref 5–15)
BUN: 31 mg/dL — AB (ref 6–20)
CALCIUM: 7.9 mg/dL — AB (ref 8.9–10.3)
CO2: 21 mmol/L — ABNORMAL LOW (ref 22–32)
CREATININE: 1.58 mg/dL — AB (ref 0.61–1.24)
Chloride: 101 mmol/L (ref 101–111)
GFR calc non Af Amer: 48 mL/min — ABNORMAL LOW (ref >60)
GFR, EST AFRICAN AMERICAN: 56 mL/min — AB (ref >60)
Glucose, Bld: 133 mg/dL — ABNORMAL HIGH (ref 65–99)
Potassium: 3 mmol/L — ABNORMAL LOW (ref 3.5–5.1)
SODIUM: 134 mmol/L — AB (ref 135–145)

## 2015-11-09 LAB — CBC
HCT: 20.6 % — ABNORMAL LOW (ref 39.0–52.0)
Hemoglobin: 7 g/dL — ABNORMAL LOW (ref 13.0–17.0)
MCH: 29 pg (ref 26.0–34.0)
MCHC: 34 g/dL (ref 30.0–36.0)
MCV: 85.5 fL (ref 78.0–100.0)
Platelets: 137 10*3/uL — ABNORMAL LOW (ref 150–400)
RBC: 2.41 MIL/uL — ABNORMAL LOW (ref 4.22–5.81)
RDW: 17.4 % — AB (ref 11.5–15.5)
WBC: 12.7 10*3/uL — ABNORMAL HIGH (ref 4.0–10.5)

## 2015-11-09 LAB — GRAM STAIN

## 2015-11-09 LAB — PREPARE RBC (CROSSMATCH)

## 2015-11-09 LAB — URINALYSIS, ROUTINE W REFLEX MICROSCOPIC
BILIRUBIN URINE: NEGATIVE
Glucose, UA: NEGATIVE mg/dL
Ketones, ur: NEGATIVE mg/dL
Leukocytes, UA: NEGATIVE
Nitrite: NEGATIVE
PH: 6 (ref 5.0–8.0)
Protein, ur: NEGATIVE mg/dL
SPECIFIC GRAVITY, URINE: 1.01 (ref 1.005–1.030)

## 2015-11-09 LAB — PROTEIN, BODY FLUID

## 2015-11-09 LAB — URINE MICROSCOPIC-ADD ON

## 2015-11-09 LAB — LACTATE DEHYDROGENASE, PLEURAL OR PERITONEAL FLUID: LD, Fluid: 36 U/L — ABNORMAL HIGH (ref 3–23)

## 2015-11-09 LAB — CULTURE, BODY FLUID W GRAM STAIN -BOTTLE

## 2015-11-09 LAB — CULTURE, BODY FLUID-BOTTLE: CULTURE: NO GROWTH

## 2015-11-09 LAB — GLUCOSE, SEROUS FLUID: Glucose, Fluid: 123 mg/dL

## 2015-11-09 MED ORDER — ACETAMINOPHEN 325 MG PO TABS
650.0000 mg | ORAL_TABLET | Freq: Four times a day (QID) | ORAL | Status: DC | PRN
  Administered 2015-11-09: 650 mg via ORAL
  Filled 2015-11-09: qty 2

## 2015-11-09 MED ORDER — POTASSIUM CHLORIDE CRYS ER 20 MEQ PO TBCR
40.0000 meq | EXTENDED_RELEASE_TABLET | Freq: Two times a day (BID) | ORAL | Status: DC
  Administered 2015-11-09 – 2015-11-13 (×9): 40 meq via ORAL
  Filled 2015-11-09 (×8): qty 2

## 2015-11-09 MED ORDER — SODIUM CHLORIDE 0.9 % IV SOLN
Freq: Once | INTRAVENOUS | Status: DC
Start: 2015-11-09 — End: 2015-11-13

## 2015-11-09 MED ORDER — DEXTROSE 5 % IV SOLN
2.0000 g | INTRAVENOUS | Status: DC
  Administered 2015-11-09 – 2015-11-13 (×5): 2 g via INTRAVENOUS
  Filled 2015-11-09 (×5): qty 2

## 2015-11-09 MED ORDER — LIDOCAINE HCL (PF) 1 % IJ SOLN
INTRAMUSCULAR | Status: AC
  Filled 2015-11-09: qty 10

## 2015-11-09 NOTE — Procedures (Signed)
Successful US guided paracentesis from [facility].  Yielded 1.8 liters of clear yellow fluid.  No immediate complications.  Pt tolerated well.   Specimen was sent for labs.  Makaiya Geerdes S Jase Himmelberger PA-C 11/09/2015 11:16 AM

## 2015-11-09 NOTE — NC FL2 (Signed)
[place] MEDICAID FL2 LEVEL OF CARE SCREENING TOOL     IDENTIFICATION  Patient Name: [name] Birthdate: 01-Jul-1961 Sex: male Admission Date (Current Location): 10/31/2015  [facility] and [place] Number:  Producer, television/film/video and Address:  [facility]. [facility], [address] [place], [place] [address]      Provider Number: [id number]  Attending Physician Name and Address:  Eddie North, MD  Relative Name and Phone Number:       Current Level of Care: Hospital Recommended Level of Care: Skilled Nursing Facility Prior Approval Number:    Date Approved/Denied:   PASRR Number: [id number]  Discharge Plan: SNF    Current Diagnoses: Patient Active Problem List   Diagnosis Date Noted  . Bleeding   . Palliative care encounter   . Absolute anemia   . Bilateral lower extremity edema   . Acute respiratory failure with hypoxia (HCC)   . AKI (acute kidney injury) (HCC)   . Alcoholic cirrhosis of liver with ascites (HCC)   . Altered mental status   . Elevated INR   . Lactic acidosis   . Subacute liver failure without hepatic coma   . Protein-calorie malnutrition, severe (HCC) 11/02/2015  . Shock (HCC) 10/31/2015  . Anemia 10/31/2015  . Cirrhosis (HCC) 10/31/2015  . Acute respiratory failure (HCC) 10/31/2015  . Pressure ulcer 10/31/2015  . Thrombocytopenia (HCC) 11/28/2014  . Allergic rhinitis 11/14/2014  . Arthritis 11/14/2014  . Cataracts, bilateral 11/14/2014  . Sleep apnea 11/14/2014  . HTN (hypertension) 11/14/2014  . Neuropathy (HCC) 11/14/2014  . Achilles tendon tear 11/14/2014  . Pedal edema 11/14/2014  . Renal failure 11/12/2013  . Syncope 11/12/2013    Orientation RESPIRATION BLADDER Height & Weight     Time, Situation, Place, Self  O2 ([place] 3L) Indwelling catheter Weight: 248 lb 6.4 oz (112.674 kg) Height:  6' (182.9 cm)  BEHAVIORAL SYMPTOMS/MOOD NEUROLOGICAL BOWEL NUTRITION STATUS      Continent Diet (mechanical soft)   AMBULATORY STATUS COMMUNICATION OF NEEDS Skin   Extensive Assist Verbally PU Stage and Appropriate Care   PU Stage 2 Dressing:  (PRN dressing, ulcers on buttocks, coccyx, and elbow)                   Personal Care Assistance Level of Assistance  Bathing, Dressing Bathing Assistance: Maximum assistance   Dressing Assistance: Maximum assistance     Functional Limitations Info             SPECIAL CARE FACTORS FREQUENCY  PT (By licensed PT), OT (By licensed OT)     PT Frequency: 5/wk OT Frequency: 5/wk            Contractures      Additional Factors Info  Code Status, Allergies, Insulin Sliding Scale Code Status Info: DNR Allergies Info: Lactose Intolerance (Gi)   Insulin Sliding Scale Info: 6/day       Current Medications (11/09/2015):  This is the current hospital active medication list Current Facility-Administered Medications  Medication Dose Route Frequency Provider Last Rate Last Dose  . 0.9 %  sodium chloride infusion   Intravenous Once Praveen Mannam, MD 10 mL/hr at 11/06/15 0800 10 mL/hr at 11/06/15 0800  . 0.9 %  sodium chloride infusion   Intravenous Once Cyril Mourning V, MD      . 0.9 %  sodium chloride infusion   Intravenous Once Cyril Mourning V, MD      . 0.9 %  sodium chloride infusion  Intravenous Once Yolanda MangesAlex M Wilson, DO   Stopped at 11/04/15 [phone]  . 0.9 %  sodium chloride infusion   Intravenous Once Erin FullingKurian Kasa, MD   Stopped at 11/04/15 0328  . 0.9 %  sodium chloride infusion   Intravenous Once Nelda Bucksaniel J Feinstein, MD      . 0.9 %  sodium chloride infusion   Intravenous Once Nishant Dhungel, MD      . 0.9 %  sodium chloride infusion   Intravenous Once Nelda Bucksaniel J Feinstein, MD      . 0.9 %  sodium chloride infusion   Intravenous Once Nishant Dhungel, MD      . albuterol (PROVENTIL) (2.5 MG/3ML) 0.083% nebulizer solution 2.5 mg  2.5 mg Nebulization Q4H PRN Nishant Dhungel, MD   2.5 mg at 11/07/15 0233  . amiodarone (PACERONE) tablet 200 mg  200 mg Oral  Daily Nishant Dhungel, MD   200 mg at 11/09/15 1027  . azelastine (ASTELIN) 0.1 % nasal spray 2 spray  2 spray Each Nare BID Nishant Dhungel, MD   2 spray at 11/06/15 1000  . cefTRIAXone (ROCEPHIN) 2 g in dextrose 5 % 50 mL IVPB  2 g Intravenous Q24H Nishant Dhungel, MD   2 g at 11/09/15 1029  . folic acid (FOLVITE) tablet 1 mg  1 mg Oral Daily Nelda Bucksaniel J Feinstein, MD   1 mg at 11/09/15 1027  . furosemide (LASIX) injection 80 mg  80 mg Intravenous Q6H Nishant Dhungel, MD   80 mg at 11/09/15 [phone]  . insulin aspart (novoLOG) injection 0-9 Units  0-9 Units Subcutaneous 6 times per day Coralyn HellingVineet Sood, MD   1 Units at 11/09/15 0413  . lidocaine (PF) (XYLOCAINE) 1 % injection           . loratadine (CLARITIN) tablet 10 mg  10 mg Oral Daily PRN Rolan Lipahomas Michael Callahan, NP   10 mg at 11/08/15 0740  . pantoprazole (PROTONIX) EC tablet 40 mg  40 mg Oral Daily Nelda Bucksaniel J Feinstein, MD   40 mg at 11/09/15 1027  . pneumococcal 23 valent vaccine (PNU-IMMUNE) injection 0.5 mL  0.5 mL Intramuscular Tomorrow-1000 Oretha Milchakesh Alva V, MD   Stopped at 11/04/15 1000  . potassium chloride SA (K-DUR,KLOR-CON) CR tablet 40 mEq  40 mEq Oral BID Nishant Dhungel, MD   40 mEq at 11/09/15 1027  . sodium chloride flush (NS) 0.9 % injection 10-40 mL  10-40 mL Intracatheter PRN Nishant Dhungel, MD   10 mL at 11/08/15 0549  . sodium chloride flush (NS) 0.9 % injection 3 mL  3 mL Intravenous PRN Leslye Peerobert S Byrum, MD   10 mL at 11/01/15 2257  . thiamine (VITAMIN B-1) tablet 100 mg  100 mg Oral Daily Nelda Bucksaniel J Feinstein, MD   100 mg at 11/09/15 1026     Discharge Medications: Please see discharge summary for a list of discharge medications.  Relevant Imaging Results:  Relevant Lab Results:   Additional Information SS#: [id number]  Izora RibasHoloman, Adekunle Rohrbach M, [place]

## 2015-11-09 NOTE — Progress Notes (Signed)
PROGRESS NOTE                                                                                                                                                                                                             Patient Demographics:    [name], is a 55 y.o. male, DOB - 05-Mar-1961, ZOX:[id number]  Admit date - 10/31/2015   Admitting Physician Leslye Peer, MD  Outpatient Primary MD for the patient is [name], [name]  LOS - 9  Outpatient Specialists: None  Chief Complaint  Patient presents with  . Shortness of Breath       Brief Narrative   55 year old male with history of alcoholic cirrhosis, hypertension, OSA (not on CPAP) who was brought to the ED for dyspnea and quickly became obtunded. He was urgently intubated in the ED. Postintubation became hypotensive and was started on norepinephrine. Labs showed severe anemia with hemoglobin of 2.7 and some cytopenia. Was transferred with several units of PRBC and FFP. No source of bleeding noted. He was found to have severely elevated lactic acid of 14 with acute kidney injury and elevated INR of 4.35. He was started on empiric vancomycin and cefepime and then switch to Zosyn. Patient also developed SVT on 4/16 and was started on amiodarone drip. Pressures were discontinued and patient extubated on 4/17. On 4/19 patient had therapeutic paracentesis with 3.3 L yellow fluid removed for increasing ascites and respiratory distress. Transferred to  hospitalist service on 4/21. CT abdomen and pelvis done on 4/21 showed large hematoma in the left upper thigh.     Subjective:   Patient became febrile with temperature of 100.7 Fahrenheit overnight. Denies any cough or shortness of breath. Denies any abdominal pain. Chest x-ray and blood cultures sent.   Assessment  & Plan :   Principal problem Hypovolemic shock  due to severe Blood loss anemia and? DIC.Marland Kitchen  Received several units of PRBC this hospitalization. Now stable, lactic acidosis resolved. Continue Protonix. Hemoglobin of 7 today. Ordered 1 unit PRBC.    Active Problems: Acute hypoxic respiratory failure with shock Secondary to hypovolemic shock, metabolic acidosis and possible pneumonia and pulmonary edema. No signs of respiratory distress and maintaining O2 sat on room air. Significant positive balance with pulmonary edema. On aggressive diuresis with IV Lasix 80 mg every 6 hours. Monitor strict I/O.  Scheduled nebs. Continue bedside spirometry.  Decompensated alcohol excess hep C cirrhosis with anasarca and acute encephalopathy Therapeutic paracentesis done on 4/17 with 3.3 L removed and placed on aggressive IV Lasix.  attempted paracentesis on 4/21 without fluid yield. abdominal distension is likely from severe third spacing seen on CT. Encephalopathy resolved. Aggressive diuresis for anasarca. When necessary IV albumin.   Severe Anemia and thrombocytopenia ? DIC with cirrhosis. Also found t have large left thigh hematoma on CT which is contribution. monitor with transfuse prn.Hemoccult-positive. elevated LDH and fibrinogen. Hematology consulted , recommended severe anemia and thrombocytopenia is contributed by bone marrow suppression as well. Elevated LDH and haptoglobin difficult to interpret in a cirrhotic patient. Recommend supportive care. Platelets slowly improving.  Appreciate help from GI. Have signed off with plan on outpatient follow-up.  Fever on 4/24 MAXIMUM TEMPERATURE of 100.7. Chest x-ray showing atelectasis versus infiltrate. UA unremarkable. Blood cultures sent. Will order diagnostic and therapeutic paracentesis to rule out SBP. Will place on empiric IV Rocephin.  Sinus tachycardia with SVT versus A. fib Required amiodarone drip in the ICU. Now with better control. Switched to by mouth. K >4 and mag >2.  2-D echo shows EF of 50-50% without wall motion  abnormality.  Acute kidney injury Possibly due to hypovolemic shock, now with cirrhosis and pulmonary edema. Getting aggressive diuresis with Lasix. Renal function slowly improving.    Hypoglycemia Possibly due to shock and poor by mouth intake. Resolved.  Hypokalemia / Hypomagnesemia Replenished. Placed on daily supplement.  ? Colitis on CT abdomen Colonic wall thickening noted. GI recommend this is more edematous change and does not need intervention.     Code Status : DO NOT RESUSCITATE  Family Communication  : Brother at bedside  Disposition Plan  : PT recommend skilled nursing facility. Possibly later this week once diuresed adequately  Barriers For Discharge : Ongoing anasarca,  persistent anemia requiring transfusion  Consults  :   PC CM (signed off ) [facility] (signed off) hematology ( Dr Pamelia Hoit, signed off)  Procedures  :  Intubation 2-D echo CT head, ultrasound abdomen Therapeutic paracentesis CT abdomen  and pelvis, CT pelvis   DVT Prophylaxis  :  SCD's  Lab Results  Component Value Date   PLT 137* 11/09/2015    Antibiotics  :  Completed  Anti-infectives    Start     Dose/Rate Route Frequency Ordered Stop   11/09/15 0945  cefTRIAXone (ROCEPHIN) 2 g in dextrose 5 % 50 mL IVPB     2 g 100 mL/hr over 30 Minutes Intravenous Every 24 hours 11/09/15 0837     11/04/15 1400  piperacillin-tazobactam (ZOSYN) IVPB 3.375 g  Status:  Discontinued     3.375 g 12.5 mL/hr over 240 Minutes Intravenous Every 8 hours 11/04/15 1059 11/05/15 1021   11/04/15 1130  metroNIDAZOLE (FLAGYL) IVPB 500 mg  Status:  Discontinued     500 mg 100 mL/hr over 60 Minutes Intravenous Every 8 hours 11/04/15 1032 11/04/15 1059   11/01/15 1600  ceFEPIme (MAXIPIME) 2 g in dextrose 5 % 50 mL IVPB  Status:  Discontinued     2 g 100 mL/hr over 30 Minutes Intravenous Every 24 hours 10/31/15 1715 11/04/15 1059   11/01/15 0400  vancomycin (VANCOCIN) IVPB 750 mg/150 ml premix  Status:   Discontinued     750 mg 150 mL/hr over 60 Minutes Intravenous Every 12 hours 10/31/15 1715 11/02/15 1206   10/31/15 1600  ceFEPIme (MAXIPIME) 2 g in dextrose  5 % 50 mL IVPB     2 g 100 mL/hr over 30 Minutes Intravenous  Once 10/31/15 1521 10/31/15 1650   10/31/15 1600  vancomycin (VANCOCIN) 1,750 mg in sodium chloride 0.9 % 500 mL IVPB     1,750 mg 250 mL/hr over 120 Minutes Intravenous NOW 10/31/15 1521 10/31/15 1814        Objective:   Filed Vitals:   11/08/15 0851 11/08/15 2026 11/09/15 0359 11/09/15 0856  BP: 131/83 115/81 135/84 128/84  Pulse: 103 109 110 96  Temp: 99.5 F (37.5 C) 100.4 F (38 C) 100.7 F (38.2 C) 99.5 F (37.5 C)  TempSrc: Oral Oral Oral Oral  Resp: 18 18 18 18   Height:      Weight:      SpO2: 97% 98% 97% 100%    Wt Readings from Last 3 Encounters:  11/07/15 114.941 kg (253 lb 6.4 oz)  11/28/14 89.268 kg (196 lb 12.8 oz)  11/14/14 90.901 kg (200 lb 6.4 oz)     Intake/Output Summary (Last 24 hours) at 11/09/15 0933 Last data filed at 11/09/15 2956  Gross per 24 hour  Intake    960 ml  Output   3450 ml  Net  -2490 ml     Physical Exam  Gen:  not in distress HEENT: Pallor present,  moist mucosa, supple neck,  Chest: Clear breath sounds bilaterally,  No wheezing CVS: N S1&S2, no murmurs, rubs or gallop GI: Distended with ascites, nontender, foley + Musculoskeletal: warm, 2+ pitting edema bilaterally with anasarca, , scrotal edema improving, erythema over left anterior thigh ( from hematoma) CNS: Alert and oriented,    Data Review:    CBC  Recent Labs Lab 11/03/15 2245  11/04/15 1805 11/05/15 0745 11/05/15 1719 11/06/15 0515 11/06/15 1109 11/07/15 0520 11/08/15 0636 11/09/15 0530  WBC 8.1  < > 10.1 7.5 8.0 7.9  --  9.6 11.7* 12.7*  HGB 5.7*  < > 7.2* 6.2* 7.1* 6.9* 7.5* 7.1* 7.1* 7.0*  HCT 16.8*  < > 20.2* 18.0* 20.7* 20.0* 21.5* 20.9* 21.2* 20.6*  PLT 63*  < > 66* 55* 62* 67*  --  109* 123* 137*  MCV 84.0  < > 83.5 82.2  83.1 82.0  --  83.6 83.8 85.5  MCH 28.5  < > 29.8 28.3 28.5 28.3  --  28.4 28.1 29.0  MCHC 33.9  < > 35.6 34.4 34.3 34.5  --  34.0 33.5 34.0  RDW 16.9*  < > 16.2* 16.2* 15.9* 16.0*  --  16.2* 16.7* 17.4*  LYMPHSABS 0.7  --  0.8 0.6*  --   --   --   --   --   --   MONOABS 1.8*  --  3.1* 2.2*  --   --   --   --   --   --   EOSABS 0.1  --  0.2 0.3  --   --   --   --   --   --   BASOSABS 0.0  --  0.0 0.0  --   --   --   --   --   --   < > = values in this interval not displayed.  Chemistries   Recent Labs Lab 11/03/15 0442 11/04/15 0623 11/04/15 1805 11/05/15 0552 11/06/15 0515 11/06/15 1452 11/07/15 0520 11/08/15 0636 11/09/15 0530  NA 139 136 136 135 135  --  135 135 134*  K 3.1* 3.0* 3.0* 3.0* 2.9*  --  3.1* 3.0* 3.0*  CL 108 108 107 105 106  --  105 102 101  CO2 20* 19* 18* 18* 20*  --  21* 21* 21*  GLUCOSE 125* 125* 141* 132* 112*  --  131* 93 133*  BUN 34* 38* 39* 37* 36*  --  34* 33* 31*  CREATININE 2.22* 2.52* 2.58* 2.32* 2.13*  --  1.79* 1.60* 1.58*  CALCIUM 6.9* 7.3* 7.4* 7.4* 7.8*  --  7.8* 8.1* 7.9*  MG 1.6* 1.5* 1.7  --   --   --  1.0* 1.3*  --   AST  --   --   --  144* 109*  --  85*  --   --   ALT  --   --   --  73* 69*  --  58  --   --   ALKPHOS  --   --   --  59 66  --  66  --   --   BILITOT  --   --   --  6.8* 8.0* 8.8* 10.0*  --   --    ------------------------------------------------------------------------------------------------------------------ No results for input(s): CHOL, HDL, LDLCALC, TRIG, CHOLHDL, LDLDIRECT in the last 72 hours.  No results found for: HGBA1C ------------------------------------------------------------------------------------------------------------------ No results for input(s): TSH, T4TOTAL, T3FREE, THYROIDAB in the last 72 hours.  Invalid input(s): FREET3 ------------------------------------------------------------------------------------------------------------------ No results for input(s): VITAMINB12, FOLATE, FERRITIN, TIBC,  IRON, RETICCTPCT in the last 72 hours.  Coagulation profile  Recent Labs Lab 11/03/15 1040 11/04/15 0623 11/05/15 0552  INR 3.73* 2.77* 2.63*    No results for input(s): DDIMER in the last 72 hours.  Cardiac Enzymes No results for input(s): CKMB, TROPONINI, MYOGLOBIN in the last 168 hours.  Invalid input(s): CK ------------------------------------------------------------------------------------------------------------------    Component Value Date/Time   BNP 694.4* 10/31/2015 1911    Inpatient Medications  Scheduled Meds: . sodium chloride   Intravenous Once  . sodium chloride   Intravenous Once  . sodium chloride   Intravenous Once  . sodium chloride   Intravenous Once  . sodium chloride   Intravenous Once  . sodium chloride   Intravenous Once  . sodium chloride   Intravenous Once  . sodium chloride   Intravenous Once  . sodium chloride   Intravenous Once  . amiodarone  200 mg Oral Daily  . azelastine  2 spray Each Nare BID  . cefTRIAXone (ROCEPHIN)  IV  2 g Intravenous Q24H  . folic acid  1 mg Oral Daily  . furosemide  80 mg Intravenous Q6H  . insulin aspart  0-9 Units Subcutaneous 6 times per day  . lidocaine (PF)      . pantoprazole  40 mg Oral Daily  . pneumococcal 23 valent vaccine  0.5 mL Intramuscular Tomorrow-1000  . potassium chloride  40 mEq Oral BID  . thiamine  100 mg Oral Daily   Continuous Infusions:  PRN Meds:.albuterol, loratadine, sodium chloride flush, sodium chloride flush  Micro Results Recent Results (from the past 240 hour(s))  Blood culture (routine x 2)     Status: None   Collection Time: 10/31/15  3:30 PM  Result Value Ref Range Status   Specimen Description BLOOD RIGHT HAND  Final   Special Requests IN PEDIATRIC BOTTLE  Final   Culture NO GROWTH 5 DAYS  Final   Report Status 11/05/2015 FINAL  Final  Blood culture (routine x 2)     Status: None   Collection Time: 10/31/15  3:56 PM  Result  Value Ref Range Status   Specimen  Description BLOOD RIGHT HAND  Final   Special Requests BOTTLES DRAWN AEROBIC AND ANAEROBIC 5CC  Final   Culture NO GROWTH 5 DAYS  Final   Report Status 11/05/2015 FINAL  Final  Urine culture     Status: None   Collection Time: 10/31/15  7:14 PM  Result Value Ref Range Status   Specimen Description URINE, CATHETERIZED  Final   Special Requests NONE  Final   Culture NO GROWTH 2 DAYS  Final   Report Status 11/02/2015 FINAL  Final  MRSA PCR Screening     Status: None   Collection Time: 10/31/15  8:45 PM  Result Value Ref Range Status   MRSA by PCR NEGATIVE NEGATIVE Final    Comment:        The GeneXpert MRSA Assay (FDA approved for NASAL specimens only), is one component of a comprehensive MRSA colonization surveillance program. It is not intended to diagnose MRSA infection nor to guide or monitor treatment for MRSA infections.   Culture, respiratory (NON-Expectorated)     Status: None   Collection Time: 11/01/15 12:22 PM  Result Value Ref Range Status   Specimen Description SPUTUM  Final   Special Requests NONE  Final   Gram Stain   Final    ABUNDANT WBC PRESENT, PREDOMINANTLY PMN FEW SQUAMOUS EPITHELIAL CELLS PRESENT FEW GRAM POSITIVE COCCI IN PAIRS FEW GRAM POSITIVE RODS THIS SPECIMEN IS ACCEPTABLE FOR SPUTUM CULTURE Performed at [facility]    Culture   Final    NORMAL OROPHARYNGEAL FLORA Performed at [facility]    Report Status 11/03/2015 FINAL  Final  Culture, body fluid-bottle     Status: None (Preliminary result)   Collection Time: 11/04/15  1:04 PM  Result Value Ref Range Status   Specimen Description FLUID PERITONEAL  Final   Special Requests NONE  Final   Culture NO GROWTH 4 DAYS  Final   Report Status PENDING  Incomplete  Gram stain     Status: None   Collection Time: 11/04/15  1:04 PM  Result Value Ref Range Status   Specimen Description FLUID PERITONEAL  Final   Special Requests NONE  Final   Gram Stain   Final    FEW WBC  PRESENT,BOTH PMN AND MONONUCLEAR NO ORGANISMS SEEN    Report Status 11/04/2015 FINAL  Final    Radiology Reports Ct Abdomen Pelvis Wo Contrast  11/06/2015  CLINICAL DATA:  Edema. Decreasing hemoglobin. Cirrhotic patient with hemoccult positive stool. EXAM: CT ABDOMEN AND PELVIS WITHOUT CONTRAST TECHNIQUE: Multidetector CT imaging of the abdomen and pelvis was performed following the standard protocol without IV contrast. COMPARISON:  Abdominal ultrasound 10/31/2015 FINDINGS: Lower chest: Right lung base consolidation with air bronchograms. Minimal left basilar opacity. Small subpulmonic left pleural effusion. Mild cardiomegaly. Decreased density of blood pool consistent with anemia. Liver: Nodular contours with cirrhotic morphology. No focal lesion detected on this unenhanced exam. Hepatobiliary: Gallbladder physiologically distended. There is high-density material within the dependent gallbladder and cystic duct. Common bile duct is not well-defined. Pancreas: No ductal dilatation. Spleen: Normal in size, maximal mention 12.8 cm. Adrenal glands: No nodule. Kidneys: No hydronephrosis or urolithiasis. Stomach/Bowel: Ingested contents in the stomach. Small hiatal hernia. Limited bowel assessment given intra-abdominal ascites. No large or small bowel obstruction. There is questionable colonic wall thickening at the splenic flexure of the colon. Formed stool in the ascending colon. Appendix tentatively but not definitively identified. Vascular/Lymphatic: Normal caliber abdominal aorta  with trace atherosclerosis. No retroperitoneal adenopathy. Limited assessment for intra-abdominal adenopathy given mesenteric edema. Reproductive: Prostate gland normal in size. Bladder: Decompressed by Foley catheter. Other: Severe whole body wall edema and skin thickening. Moderate intra-abdominal ascites. Diffuse mesenteric edema and small volume mesenteric free fluid. No free air. Musculoskeletal: There are no acute or  suspicious osseous abnormalities. Degenerative change involving the left greater than right hip and throughout the spine. IMPRESSION: 1. Probable colonic wall thickening at the splenic flexure of the colon, limited bowel evaluation given lack of enteric contrast and intra-abdominal ascites. This may be reactive or colitis. Ischemic colitis can affect this region, however there is only minimal atherosclerosis. Infectious or inflammatory etiologies are also considered. 2. Severe body wall edema, skin thickening, and mesenteric edema. Findings consistent with severe third-spacing. 3. Cirrhotic hepatic morphology. Moderate intra-abdominal ascites. No splenomegaly. 4. Right greater than left lung base consolidation with air bronchograms, pneumonia versus atelectasis. Trace left pleural effusion. 5. High-density material within the gallbladder and cystic duct, likely sludge, as no stones were seen on recent abdominal ultrasound. Electronically Signed   By: Rubye OaksMelanie  Ehinger M.D.   On: 11/06/2015 19:06   Ct Head Wo Contrast  10/31/2015  CLINICAL DATA:  Alcohol abuse. Dyspnea requiring intubation. Altered mental status. Hypotensive post intubation requiring norepinephrine. EXAM: CT HEAD WITHOUT CONTRAST TECHNIQUE: Contiguous axial images were obtained from the base of the skull through the vertex without intravenous contrast. COMPARISON:  11/12/2013 head CT. FINDINGS: Two oral route tubes enter the upper trachea and hypopharynx on the lateral scout topogram. Mild fluid is seen layering in the nasopharynx. No evidence of parenchymal hemorrhage or extra-axial fluid collection. No mass lesion, mass effect, or midline shift. No CT evidence of acute infarction. Intracranial atherosclerosis. Nonspecific mild subcortical and periventricular white matter hypodensity, most in keeping with chronic small vessel ischemic change. Cerebral volume is age appropriate. No ventriculomegaly. The visualized paranasal sinuses are essentially  clear. The mastoid air cells are unopacified. No evidence of calvarial fracture. IMPRESSION: 1.  No evidence of acute intracranial abnormality. 2. Intracranial atherosclerosis and mild chronic small vessel ischemia. Electronically Signed   By: Delbert PhenixJason A Poff M.D.   On: 10/31/2015 20:33   [place] Abdomen Complete  10/31/2015  CLINICAL DATA:  Inpatient with cirrhosis, shock and renal failure. EXAM: ABDOMEN ULTRASOUND COMPLETE COMPARISON:  01/07/2011 CT abdomen/ pelvis. FINDINGS: Gallbladder: No gallstones or wall thickening visualized. No sonographic Murphy sign noted by sonographer. Common bile duct: Diameter: 4 mm Liver: Liver parenchyma is diffusely heterogeneous in echotexture in the liver surface is diffusely irregular in contour, in keeping with cirrhosis. There is reversal of flow in the main portal vein. No liver masses demonstrated. IVC: Not visualized. Pancreas: Not well visualized due to overlying bowel gas and other patient related factors for Spleen: Size and appearance within normal limits. Right Kidney: Length: 10.6 cm. Echogenicity within normal limits. No mass or hydronephrosis visualized. Left Kidney: Length: 11.2 cm. Echogenicity within normal limits. No mass or hydronephrosis visualized. Abdominal aorta: No aneurysm visualized. Other findings: Moderate volume ascites. IMPRESSION: 1. Cirrhosis.  No liver mass detected. 2. Reversed (hepatofugal) flow in the main portal vein. Moderate volume ascites. No splenomegaly . 3. Normal gallbladder with no cholelithiasis. No biliary ductal dilatation. Electronically Signed   By: Delbert PhenixJason A Poff M.D.   On: 10/31/2015 23:58   Ct Femur Left Wo Contrast  11/06/2015  CLINICAL DATA:  Chronic gastrointestinal bleeding. Swelling in the left groin and femur. Question hematoma. Initial encounter. EXAM: CT OF THE LEFT  FEMUR WITHOUT CONTRAST TECHNIQUE: Multidetector CT imaging was performed according to the standard protocol. Multiplanar CT image reconstructions were also  generated. COMPARISON:  None. FINDINGS: Imaging incidentally includes the right upper leg. Diffuse and intense subcutaneous edema is identified throughout. The short adductor and adductor magnus on the left are markedly swollen and hyperattenuating consistent with the presence of hematoma. Discrete measurement is not possible but the hematoma measures approximately 6.3 cm AP by 8.1 cm transverse by 14.2 cm craniocaudal. No other hematoma is identified. No soft tissue gas collection is seen. Partial visualization of degenerative change about the hips. IMPRESSION: Large appearing hematoma in the adductor musculature of the left upper leg cannot be discretely measured. Diffuse and intense subcutaneous edema in the lower pelvis and upper legs bilaterally. No focal bony abnormality. Electronically Signed   By: Drusilla Kanner M.D.   On: 11/06/2015 17:52   Dg Chest Port 1 View  11/09/2015  CLINICAL DATA:  Fever, shortness of breath, hypertension EXAM: PORTABLE CHEST 1 VIEW COMPARISON:  11/06/2015 FINDINGS: Low lung volumes with bibasilar atelectasis or infiltrates. Heart is mildly enlarged. Mild vascular congestion. No visible effusions. Left central line remains in place, unchanged. IMPRESSION: Cardiomegaly, mild vascular congestion. Bibasilar atelectasis or infiltrates. No real change since prior study. Electronically Signed   By: Charlett Nose M.D.   On: 11/09/2015 07:37   Dg Chest Port 1 View  11/06/2015  CLINICAL DATA:  Pulmonary edema, acute respiratory failure, cirrhosis, renal failure EXAM: PORTABLE CHEST 1 VIEW COMPARISON:  Portable chest x-ray of November 05, 2015 FINDINGS: The lungs are mildly hypoinflated. Parenchymal density medially at the right lung base persists. The cardiac silhouette is top-normal in size. The central pulmonary vascularity is mildly prominent. No pleural effusion or pneumothorax is observed. The left internal jugular venous catheter tip projects over the mid portion of the SVC.  IMPRESSION: Mild pulmonary interstitial prominence stable likely reflecting mild interstitial edema. Confluent alveolar opacity at the right lung base medially consistent with atelectasis or pneumonia. No significant pleural effusion and no pneumothorax. Electronically Signed   By: David  [place] M.D.   On: 11/06/2015 07:20   Dg Chest Port 1 View  11/05/2015  CLINICAL DATA:  Dyspnea EXAM: PORTABLE CHEST 1 VIEW COMPARISON:  11/02/2015 FINDINGS: Left internal jugular central line again identified. Tip obscured by overlying EKG leads. Heart size and vascular pattern normal. Low lung volumes. Bilateral lower lobe opacities right worse than left. There has been mild improvement in bilateral lung base aeration. IMPRESSION: Persistent but improved bibasilar opacities. Electronically Signed   By: Esperanza Heir M.D.   On: 11/05/2015 07:11   Dg Chest Port 1 View  11/02/2015  CLINICAL DATA:  55 year old male with respiratory failure. Subsequent encounter. EXAM: PORTABLE CHEST 1 VIEW COMPARISON:  11/01/2015. FINDINGS: Endotracheal tube tip 3.6 cm above the carina. Left central line is in place with the tip angulated possibly entering the azygos vein unchanged from prior exam. Lateral view be necessary for further delineation. No gross pneumothorax. Nasogastric tube courses below the diaphragm. Tip is not included on the present exam. Asymmetric airspace disease greatest lung bases more notable on the right may represent pulmonary edema with basilar atelectasis, pleural fluid or infiltrate. Heart size top-normal. Calcified aorta. IMPRESSION: Similar appearance of asymmetric airspace disease most notable lung bases greater on the right. Left central line tip possibly entering the azygos vein. Please see above. Electronically Signed   By: Lacy Duverney M.D.   On: 11/02/2015 08:07   Dg Chest [facility]  1 View  11/01/2015  CLINICAL DATA:  Central line placement. EXAM: PORTABLE CHEST 1 VIEW COMPARISON:  Yesterday. FINDINGS:  Endotracheal tube in satisfactory position. Nasogastric tube extending into the stomach. Interval left jugular catheter with its tip directed superiorly. Poor inspiration. Normal sized heart. Mild increase in bibasilar opacity. Unremarkable bones. IMPRESSION: 1. Poor inspiration with increased bibasilar atelectasis. 2. Left jugular catheter tip directed superiorly. This may be in the right innominate vein, azygos vein or proximal superior vena cava. 3. No pneumothorax. Electronically Signed   By: Beckie Salts M.D.   On: 11/01/2015 07:13   Dg Chest Portable 1 View  10/31/2015  CLINICAL DATA:  55 year old male status post intubation. EXAM: PORTABLE CHEST 1 VIEW COMPARISON:  Chest x-ray 11/13/2013. FINDINGS: An endotracheal tube is in place with tip 2.8 cm above the carina. A nasogastric tube is seen extending into the stomach, however, the tip of the nasogastric tube extends below the lower margin of the image. Lung volumes are very low. Opacity at the right base presumably reflects atelectasis in the right middle and lower lobes. Left lung appears relatively clear with minimal subsegmental atelectasis in the left lower lobe. No definite pleural effusions. No evidence of pulmonary edema. Heart size is normal. The patient is rotated to the right on today's exam, resulting in distortion of the mediastinal contours and reduced diagnostic sensitivity and specificity for mediastinal pathology. IMPRESSION: 1. Support apparatus, as above. 2. Low lung volumes with extensive atelectasis in the right middle and lower lobes. Electronically Signed   By: Trudie Reed M.D.   On: 10/31/2015 15:47    Time Spent in minutes  25   Eddie North M.D on 11/09/2015 at 9:33 AM  Between 7am to 7pm - Pager - [phone]  After 7pm go to [email or web address] - password [facility]  [facility] -  Office  [phone]

## 2015-11-09 NOTE — Clinical Social Work Note (Signed)
Clinical Social Work Assessment  Patient Details  Name: [name] MRN: [id number] Date of Birth: 07/19/1960  Date of referral:  11/09/15               Reason for consult:  Facility Placement                Permission sought to share information with:  Oceanographeracility Contact Representative Permission granted to share information::  Yes, Verbal Permission Granted  Name::        Agency[facility]  Relationship::     Contact Information:     Housing/Transportation Living arrangements for the past 2 months:  Single Family Home Source of Information:  Patient Patient Interpreter Needed:  None Criminal Activity/Legal Involvement Pertinent to Current Situation/Hospitalization:  No - Comment as needed Significant Relationships:  Other(Comment), Siblings (niece and nephew) Lives with:  Self Do you feel safe going back to the place where you live?  Yes Need for family participation in patient care:  No (Coment)  Care giving concerns:  Pt lives at home alone and is needing assistance with mobility   Office managerocial Worker assessment / plan: CSW spoke with pt concernign PT recommendation for SNF- CSW explained SNF and referral process.    Employment status:    Health and safety inspectornsurance information:  Managed Care PT Recommendations:  Skilled Nursing Facility Information / Referral to community resources:  Skilled Nursing Facility  Patient/Family's Response to care:  Pt is not agreeable to SNF at this time and thinks that he can have a niece or nephew come live with him and help him till he gets stronger.  CSW spoke with pt about SNF as alternate option if family is unable to stay with him- pt is agreeable but prefers to go home.  Patient/Family's Understanding of and Emotional Response to Diagnosis, Current Treatment, and Prognosis:  Pt has no questions or concerns about condition/prognosis at this time but does seem somewhat unrealistic about how he can manage at home- pt is very hopeful he can return  home.  Emotional Assessment Appearance:  Appears stated age Attitude/Demeanor/Rapport:    Affect (typically observed):  Appropriate Orientation:  Oriented to Self, Oriented to Place, Oriented to  Time, Oriented to Situation Alcohol / Substance use:  Not Applicable Psych involvement (Current and /or in the community):  No (Comment)  Discharge Needs  Concerns to be addressed:  Discharge Planning Concerns, Care Coordination Readmission within the last 30 days:  No Current discharge risk:  Physical Impairment Barriers to Discharge:  Continued Medical Work up   [name], Bradley Carra M, LCSW 11/09/2015, 2:17 PM

## 2015-11-09 NOTE — Progress Notes (Signed)
Daily Progress Note   Patient Name: [name]       Date: 11/09/2015 DOB: Aug 06, 1960  Age: 55 y.o. MRN#: [id number] Attending Physician: Eddie North, MD Primary Care Physician: Marisue Brooklyn Admit Date: 10/31/2015  55 yo male with PMH of Hep C cirrhosis, OSA, HTN, and anemia.  Admitted 4/15 with hypovolemic shock and a lactic acid of 14.  He was intubated / extubated.  Found to be in SVT requiring an amiodarone drip.  He has decompensated Hep C cirrhosis with acute renal failure and has developed a large hematoma on his left thigh.    Reason for Consultation/Follow-up: Disposition and Psychosocial/spiritual support  Subjective:  No complaints.  Denied pain.  Interval Events: Patient receiving blood transfusion for a hgb of 7.0.  1 year ago his hgb was approx 12.   Acute portion of anemia likely due to thigh hematoma and coagulopathy.    HCPOA and financial POA is his brother Ahmari Duerson - per patient.  Patient is extremely pleasant.  His sister in law is in the room with him.  He does not comprehend that he is unable to be at home alone.  No one currently lives with him.   Length of Stay: 9 days  Current Medications: Scheduled Meds:  . sodium chloride   Intravenous Once  . sodium chloride   Intravenous Once  . sodium chloride   Intravenous Once  . sodium chloride   Intravenous Once  . sodium chloride   Intravenous Once  . sodium chloride   Intravenous Once  . sodium chloride   Intravenous Once  . sodium chloride   Intravenous Once  . sodium chloride   Intravenous Once  . amiodarone  200 mg Oral Daily  . azelastine  2 spray Each Nare BID  . cefTRIAXone (ROCEPHIN)  IV  2 g Intravenous Q24H  . folic acid  1 mg Oral Daily  . furosemide  80 mg Intravenous Q6H  .  insulin aspart  0-9 Units Subcutaneous 6 times per day  . lidocaine (PF)      . pantoprazole  40 mg Oral Daily  . pneumococcal 23 valent vaccine  0.5 mL Intramuscular Tomorrow-1000  . potassium chloride  40 mEq Oral BID  . thiamine  100 mg Oral Daily    Continuous Infusions:  PRN Meds: acetaminophen, albuterol, loratadine, sodium chloride flush, sodium chloride flush   Physical Exam       Very pleasant appearing male, NAD, eating lunch.  Sister in law at bedside Lower extremities with 3++ pitting.        Vital Signs: BP 114/73 mmHg  Pulse 99  Temp(Src) 99.6 F (37.6 C) (Oral)  Resp 20  Ht 6' (1.829 m)  Wt 112.674 kg (248 lb 6.4 oz)  BMI 33.68 kg/m2  SpO2 100% SpO2: SpO2: 100 % O2 Device: O2 Device: Not Delivered O2 Flow Rate: O2 Flow Rate (L/min): 3 L/min  Intake/output summary:  Intake/Output Summary (Last 24 hours) at 11/09/15 1622 Last data filed at 11/09/15 1607  Gross per 24 hour  Intake   1320 ml  Output   4550 ml  Net  -3230 ml   LBM: Last BM Date: 11/09/15 Baseline Weight: Weight: 99.1 kg (218 lb 7.6 oz) Most recent weight: Weight: 112.674 kg (248 lb 6.4 oz)       Palliative Assessment/Data:   Additional Data Reviewed: CBC    Component Value Date/Time   WBC 12.7* 11/09/2015 0530   RBC 2.41* 11/09/2015 0530   RBC 2.70* 11/04/2015 1111   HGB 7.0* 11/09/2015 0530   HCT 20.6* 11/09/2015 0530   PLT 137* 11/09/2015 0530   MCV 85.5 11/09/2015 0530   MCH 29.0 11/09/2015 0530   MCHC 34.0 11/09/2015 0530   RDW 17.4* 11/09/2015 0530   LYMPHSABS 0.6* 11/05/2015 0745   MONOABS 2.2* 11/05/2015 0745   EOSABS 0.3 11/05/2015 0745   BASOSABS 0.0 11/05/2015 0745    CMP     Component Value Date/Time   NA 134* 11/09/2015 0530   K 3.0* 11/09/2015 0530   CL 101 11/09/2015 0530   CO2 21* 11/09/2015 0530   GLUCOSE 133* 11/09/2015 0530   BUN 31* 11/09/2015 0530   CREATININE 1.58* 11/09/2015 0530   CALCIUM 7.9* 11/09/2015 0530   PROT 6.4* 11/07/2015 0520     ALBUMIN 1.9* 11/07/2015 0520   AST 85* 11/07/2015 0520   ALT 58 11/07/2015 0520   ALKPHOS 66 11/07/2015 0520   BILITOT 10.0* 11/07/2015 0520   GFRNONAA 48* 11/09/2015 0530   GFRAA 56* 11/09/2015 0530       Problem List:  Patient Active Problem List   Diagnosis Date Noted  . Bleeding   . Palliative care encounter   . Absolute anemia   . Bilateral lower extremity edema   . Acute respiratory failure with hypoxia (HCC)   . AKI (acute kidney injury) (HCC)   . Alcoholic cirrhosis of liver with ascites (HCC)   . Altered mental status   . Elevated INR   . Lactic acidosis   . Subacute liver failure without hepatic coma   . Protein-calorie malnutrition, severe (HCC) 11/02/2015  . Shock (HCC) 10/31/2015  . Anemia 10/31/2015  . Cirrhosis (HCC) 10/31/2015  . Acute respiratory failure (HCC) 10/31/2015  . Pressure ulcer 10/31/2015  . Thrombocytopenia (HCC) 11/28/2014  . Allergic rhinitis 11/14/2014  . Arthritis 11/14/2014  . Cataracts, bilateral 11/14/2014  . Sleep apnea 11/14/2014  . HTN (hypertension) 11/14/2014  . Neuropathy (HCC) 11/14/2014  . Achilles tendon tear 11/14/2014  . Pedal edema 11/14/2014  . Renal failure 11/12/2013  . Syncope 11/12/2013     Palliative Care Assessment & Plan    Code Status: DNR     Most Recent Value   Type of Advance Directive  Healthcare Power of Attorney   Pre-existing out of facility  DNR order (yellow form or pink MOST form)     "MOST" Form in Place?         Goals of Care/Additional Recommendations:  D/C to SNF with Palliative Follow Up when medically appropriate  Full scope treatment for now.  Meeting with Avon Gully, (brother Francesco Sor) and multiple family members on 4/26 to explain his disease and prognosis.  Hopefully we will further delineate his GOC.    Palliative Prophylaxis:   Aspiration, Frequent Pain Assessment and Palliative Wound Care  5. Prognosis: less than 6 months.  6. Discharge Planning:  Skilled Nursing  Facility for rehab with Palliative care service follow-up   Care plan was discussed with sister in law at bedside.  Thank you for allowing the Palliative Medicine Team to assist in the care of this patient.   Time In: 2:00 Time Out: 2:25 Total Time 25 min Prolonged Time Billed no        Stephani Police, PA-C  11/09/2015, 4:22 PM  Please contact Palliative Medicine Team phone at [phone] for questions and concerns.

## 2015-11-09 NOTE — Progress Notes (Signed)
Met with [name] in Palliative Care follow-up. CT of his abdomen/pelvis and left leg. We had a discussion about his goals of care and future planning in state of terminal liver disease. Prognostication difficult. We discussed best and worst case scenarios. He still has hope for improvement. His insight is limited into the severity of his condition. He is in general very agreeable, recommend regularly asking open ended questions about what he understands- he generally is very agreeable.  1. DNR 2. We are following for needs -mostly now care coordination while medical work-up is being complete.  [name], DO Palliative Medicine [phone]

## 2015-11-09 NOTE — Progress Notes (Signed)
Physical Therapy Treatment Patient Details Name: [name] Pester MRN: [id number] DOB: 08/13/1960 Today's Date: 11/09/2015    History of Present Illness pt presents Obtunded with Renal Failure and Shock.  pt with hx of Etoh, HTN, OSA, Cataracts, Etoh Hepatitis, Pancreatitis, Ascites, Hepatomegaly, and Neuropathy.      PT Comments    Making progress with functional mobility; Reports this is the first time he has stood and been out of bed in quite a while;   BPs/HRs in session as follows:    11/09/15 1052 11/09/15 1101 11/09/15 1107  Vital Signs  Pulse Rate 98 (!) 113 (!) 116  BP 109/73 mmHg (!) 144/96 mmHg 127/85 mmHg  Patient Position (if appropriate) Lying Sitting Sitting (after transfer to chair)   No reports of dizziness    Follow Up Recommendations  SNF     Equipment Recommendations  None recommended by PT    Recommendations for Other Services       Precautions / Restrictions Precautions Precautions: Fall Restrictions Weight Bearing Restrictions: No    Mobility  Bed Mobility Overal bed mobility: Needs Assistance Bed Mobility: Supine to Sit     Supine to sit: Mod assist     General bed mobility comments: Able to "walk" his legs of the EOB in prep for getting up; handheld assist and rials to pull to sit; used bed pad to square off hips at EOB  Transfers Overall transfer level: Needs assistance Equipment used: 2 person hand held assist Transfers: Sit to/from Stand Sit to Stand: Mod assist;+2 safety/equipment         General transfer comment: Mod assist to pwer up and provide steadiness at initial stand; this is the first time the pt has stood in his recent memory  Ambulation/Gait Ambulation/Gait assistance: +2 safety/equipment;Min assist Ambulation Distance (Feet):  (pivot steps bed to chair) Assistive device: 2 person hand held assist           Stairs            Wheelchair Mobility    Modified Rankin (Stroke Patients Only)        Balance     Sitting balance-Leahy Scale: Good       Standing balance-Leahy Scale: Fair                      Cognition Arousal/Alertness: Awake/alert Behavior During Therapy: WFL for tasks assessed/performed Overall Cognitive Status: Within Functional Limits for tasks assessed                      Exercises      General Comments        Pertinent Vitals/Pain Pain Assessment: No/denies pain (just is exhausted)    Home Living                      Prior Function            PT Goals (current goals can now be found in the care plan section) Acute Rehab PT Goals Patient Stated Goal: Home PT Goal Formulation: With patient Time For Goal Achievement: 11/19/15 Potential to Achieve Goals: Good Progress towards PT goals: Progressing toward goals    Frequency  Min 3X/week    PT Plan Current plan remains appropriate    Co-evaluation             End of Session Equipment Utilized During Treatment: Gait belt Activity Tolerance: Patient tolerated treatment well Patient left: in chair;with call bell/phone  within reach     Time: 1047-1114 PT Time Calculation (min) (ACUTE ONLY): 27 min  Charges:  $Therapeutic Activity: 23-37 mins                    G Codes:      Olen Pel 11/09/2015, 12:31 PM  Van Clines, [place]  Acute Rehabilitation Services Pager [phone] Office [phone]

## 2015-11-10 DIAGNOSIS — Z7189 Other specified counseling: Secondary | ICD-10-CM | POA: Insufficient documentation

## 2015-11-10 DIAGNOSIS — R601 Generalized edema: Secondary | ICD-10-CM | POA: Insufficient documentation

## 2015-11-10 DIAGNOSIS — K7469 Other cirrhosis of liver: Secondary | ICD-10-CM | POA: Insufficient documentation

## 2015-11-10 LAB — BASIC METABOLIC PANEL
Anion gap: 12 (ref 5–15)
BUN: 28 mg/dL — AB (ref 6–20)
CALCIUM: 7.8 mg/dL — AB (ref 8.9–10.3)
CO2: 22 mmol/L (ref 22–32)
CREATININE: 1.57 mg/dL — AB (ref 0.61–1.24)
Chloride: 102 mmol/L (ref 101–111)
GFR calc Af Amer: 56 mL/min — ABNORMAL LOW (ref >60)
GFR, EST NON AFRICAN AMERICAN: 49 mL/min — AB (ref >60)
GLUCOSE: 93 mg/dL (ref 65–99)
Potassium: 3 mmol/L — ABNORMAL LOW (ref 3.5–5.1)
Sodium: 136 mmol/L (ref 135–145)

## 2015-11-10 LAB — CBC
HEMATOCRIT: 25 % — AB (ref 39.0–52.0)
Hemoglobin: 8.3 g/dL — ABNORMAL LOW (ref 13.0–17.0)
MCH: 27.9 pg (ref 26.0–34.0)
MCHC: 33.2 g/dL (ref 30.0–36.0)
MCV: 84.2 fL (ref 78.0–100.0)
Platelets: 142 10*3/uL — ABNORMAL LOW (ref 150–400)
RBC: 2.97 MIL/uL — AB (ref 4.22–5.81)
RDW: 17.2 % — AB (ref 11.5–15.5)
WBC: 13.6 10*3/uL — AB (ref 4.0–10.5)

## 2015-11-10 LAB — GLUCOSE, CAPILLARY
GLUCOSE-CAPILLARY: 125 mg/dL — AB (ref 65–99)
Glucose-Capillary: 101 mg/dL — ABNORMAL HIGH (ref 65–99)
Glucose-Capillary: 110 mg/dL — ABNORMAL HIGH (ref 65–99)
Glucose-Capillary: 121 mg/dL — ABNORMAL HIGH (ref 65–99)
Glucose-Capillary: 89 mg/dL (ref 65–99)
Glucose-Capillary: 93 mg/dL (ref 65–99)

## 2015-11-10 LAB — URINE CULTURE: Culture: NO GROWTH

## 2015-11-10 LAB — HEPATIC FUNCTION PANEL
ALBUMIN: 2.2 g/dL — AB (ref 3.5–5.0)
ALK PHOS: 74 U/L (ref 38–126)
ALT: 41 U/L (ref 17–63)
AST: 63 U/L — AB (ref 15–41)
BILIRUBIN DIRECT: 5.3 mg/dL — AB (ref 0.1–0.5)
BILIRUBIN TOTAL: 13 mg/dL — AB (ref 0.3–1.2)
Indirect Bilirubin: 7.7 mg/dL — ABNORMAL HIGH (ref 0.3–0.9)
Total Protein: 6.7 g/dL (ref 6.5–8.1)

## 2015-11-10 LAB — MAGNESIUM: Magnesium: 0.9 mg/dL — CL (ref 1.7–2.4)

## 2015-11-10 LAB — PATHOLOGIST SMEAR REVIEW

## 2015-11-10 MED ORDER — MAGNESIUM SULFATE 4 GM/100ML IV SOLN
4.0000 g | Freq: Once | INTRAVENOUS | Status: AC
  Administered 2015-11-10: 4 g via INTRAVENOUS
  Filled 2015-11-10: qty 100

## 2015-11-10 MED ORDER — MAGNESIUM OXIDE 400 (241.3 MG) MG PO TABS
400.0000 mg | ORAL_TABLET | Freq: Two times a day (BID) | ORAL | Status: AC
  Administered 2015-11-10 (×2): 400 mg via ORAL
  Filled 2015-11-10 (×2): qty 1

## 2015-11-10 MED ORDER — HYDROMORPHONE HCL 1 MG/ML IJ SOLN
0.5000 mg | INTRAMUSCULAR | Status: DC | PRN
  Administered 2015-11-10 – 2015-11-11 (×2): 0.5 mg via INTRAVENOUS
  Filled 2015-11-10 (×2): qty 1

## 2015-11-10 MED ORDER — AZITHROMYCIN 500 MG PO TABS
500.0000 mg | ORAL_TABLET | Freq: Every day | ORAL | Status: DC
  Administered 2015-11-10 – 2015-11-13 (×4): 500 mg via ORAL
  Filled 2015-11-10 (×4): qty 1

## 2015-11-10 MED ORDER — POTASSIUM CHLORIDE CRYS ER 20 MEQ PO TBCR
40.0000 meq | EXTENDED_RELEASE_TABLET | Freq: Once | ORAL | Status: AC
  Administered 2015-11-10: 40 meq via ORAL
  Filled 2015-11-10: qty 2

## 2015-11-10 MED ORDER — ONDANSETRON HCL 4 MG/2ML IJ SOLN
4.0000 mg | Freq: Four times a day (QID) | INTRAMUSCULAR | Status: DC | PRN
  Administered 2015-11-10: 4 mg via INTRAVENOUS
  Filled 2015-11-10: qty 2

## 2015-11-10 MED ORDER — PRO-STAT SUGAR FREE PO LIQD
30.0000 mL | Freq: Two times a day (BID) | ORAL | Status: DC
  Administered 2015-11-10 – 2015-11-13 (×7): 30 mL via ORAL
  Filled 2015-11-10 (×7): qty 30

## 2015-11-10 NOTE — Progress Notes (Signed)
Daily Progress Note   Patient Name: [name]       Date: 11/10/2015 DOB: February 15, 1961  Age: 55 y.o. MRN#: [id number] Attending Physician: Eddie North, MD Primary Care Physician: Marisue Brooklyn Admit Date: 10/31/2015  55 yo male with PMH of Hep C cirrhosis, OSA, HTN, and anemia.  Admitted 4/15 with hypovolemic shock and a lactic acid of 14.  He was intubated / extubated.  Found to be in SVT requiring an amiodarone drip.  He has decompensated Hep C cirrhosis with acute renal failure and has developed a large hematoma on his left thigh.    Reason for Consultation/Follow-up: Disposition, Establishing goals of care and Psychosocial/spiritual support  Subjective:  No complaints.  Denied pain. We discussed HCPOA, and completing the official paper work.   Length of Stay: 10 days  Current Medications: Scheduled Meds:  . sodium chloride   Intravenous Once  . sodium chloride   Intravenous Once  . sodium chloride   Intravenous Once  . sodium chloride   Intravenous Once  . sodium chloride   Intravenous Once  . sodium chloride   Intravenous Once  . sodium chloride   Intravenous Once  . sodium chloride   Intravenous Once  . sodium chloride   Intravenous Once  . amiodarone  200 mg Oral Daily  . azelastine  2 spray Each Nare BID  . azithromycin  500 mg Oral Daily  . cefTRIAXone (ROCEPHIN)  IV  2 g Intravenous Q24H  . feeding supplement (PRO-STAT SUGAR FREE 64)  30 mL Oral BID  . folic acid  1 mg Oral Daily  . furosemide  80 mg Intravenous Q6H  . insulin aspart  0-9 Units Subcutaneous 6 times per day  . magnesium oxide  400 mg Oral BID  . pantoprazole  40 mg Oral Daily  . pneumococcal 23 valent vaccine  0.5 mL Intramuscular Tomorrow-1000  . potassium chloride  40 mEq Oral BID  .  thiamine  100 mg Oral Daily    Continuous Infusions:    PRN Meds: acetaminophen, albuterol, loratadine, ondansetron, sodium chloride flush, sodium chloride flush   Physical Exam       Very pleasant appearing male, NAD, eating lunch.  Sister in law at bedside CV:  Regular Resp:  No distress Lower extremities with 3++ pitting.  Vital Signs: BP 122/70 mmHg  Pulse 105  Temp(Src) 99.5 F (37.5 C) (Oral)  Resp 17  Ht 6' (1.829 m)  Wt 109.907 kg (242 lb 4.8 oz)  BMI 32.85 kg/m2  SpO2 95% SpO2: SpO2: 95 % O2 Device: O2 Device: Not Delivered O2 Flow Rate: O2 Flow Rate (L/min): 3 L/min  Intake/output summary:   Intake/Output Summary (Last 24 hours) at 11/10/15 1504 Last data filed at 11/10/15 1049  Gross per 24 hour  Intake    860 ml  Output   1976 ml  Net  -1116 ml   LBM: Last BM Date: 11/10/15 Baseline Weight: Weight: 99.1 kg (218 lb 7.6 oz) Most recent weight: Weight: 109.907 kg (242 lb 4.8 oz)       Palliative Assessment/Data: Flowsheet Rows        Most Recent Value   Intake Tab    Referral Department  Hospitalist   Unit at Time of Referral  Med/Surg Unit   Palliative Care Primary Diagnosis  Other (Comment)   Clinical Assessment    Palliative Performance Scale Score  40%   Psychosocial & Spiritual Assessment    Palliative Care Outcomes    Patient/Family meeting held?  Yes   Who was at the meeting?  patient.  Then his brother by phone.   Palliative Care Outcomes  Provided advance care planning      Additional Data Reviewed: CBC    Component Value Date/Time   WBC 13.6* 11/10/2015 0600   RBC 2.97* 11/10/2015 0600   RBC 2.70* 11/04/2015 1111   HGB 8.3* 11/10/2015 0600   HCT 25.0* 11/10/2015 0600   PLT 142* 11/10/2015 0600   MCV 84.2 11/10/2015 0600   MCH 27.9 11/10/2015 0600   MCHC 33.2 11/10/2015 0600   RDW 17.2* 11/10/2015 0600   LYMPHSABS 0.6* 11/05/2015 0745   MONOABS 2.2* 11/05/2015 0745   EOSABS 0.3 11/05/2015 0745   BASOSABS 0.0  11/05/2015 0745    CMP     Component Value Date/Time   NA 136 11/10/2015 0600   K 3.0* 11/10/2015 0600   CL 102 11/10/2015 0600   CO2 22 11/10/2015 0600   GLUCOSE 93 11/10/2015 0600   BUN 28* 11/10/2015 0600   CREATININE 1.57* 11/10/2015 0600   CALCIUM 7.8* 11/10/2015 0600   PROT 6.7 11/10/2015 0600   ALBUMIN 2.2* 11/10/2015 0600   AST 63* 11/10/2015 0600   ALT 41 11/10/2015 0600   ALKPHOS 74 11/10/2015 0600   BILITOT 13.0* 11/10/2015 0600   GFRNONAA 49* 11/10/2015 0600   GFRAA 56* 11/10/2015 0600       Problem List:  Patient Active Problem List   Diagnosis Date Noted  . Goals of care, counseling/discussion   . Counseling regarding goals of care   . Bleeding   . Palliative care encounter   . Absolute anemia   . Bilateral lower extremity edema   . Acute respiratory failure with hypoxia (HCC)   . AKI (acute kidney injury) (HCC)   . Alcoholic cirrhosis of liver with ascites (HCC)   . Altered mental status   . Elevated INR   . Lactic acidosis   . Subacute liver failure without hepatic coma   . Protein-calorie malnutrition, severe (HCC) 11/02/2015  . Shock (HCC) 10/31/2015  . Anemia 10/31/2015  . Cirrhosis (HCC) 10/31/2015  . Acute respiratory failure (HCC) 10/31/2015  . Pressure ulcer 10/31/2015  . Thrombocytopenia (HCC) 11/28/2014  . Allergic rhinitis 11/14/2014  . Arthritis 11/14/2014  . Cataracts, bilateral 11/14/2014  .  Sleep apnea 11/14/2014  . HTN (hypertension) 11/14/2014  . Neuropathy (HCC) 11/14/2014  . Achilles tendon tear 11/14/2014  . Pedal edema 11/14/2014  . Renal failure 11/12/2013  . Syncope 11/12/2013     Palliative Care Assessment & Plan    Code Status: DNR  Goals of Care/Additional Recommendations:  D/C to SNF with Palliative Follow Up when medically appropriate  Full scope treatment for now.  Meeting with Avon Gully, (brother Francesco Sor) and multiple family members on 4/26 to explain his disease and prognosis.  Hopefully we will  further delineate his GOC.   Palliative Prophylaxis:   Aspiration, Frequent Pain Assessment and Palliative Wound Care  5. Prognosis: less than 6 months.  6. Discharge Planning:  Skilled Nursing Facility for rehab with Palliative care service follow-up   Care plan was discussed with sister in law at bedside.  Thank you for allowing the Palliative Medicine Team to assist in the care of this patient.   Time In: 2:30 Time Out: 3:00 Total Time 30 Prolonged Time Billed no        Stephani Police, PA-C  11/10/2015, 3:04 PM  Please contact Palliative Medicine Team phone at [phone] for questions and concerns.

## 2015-11-10 NOTE — Clinical Social Work Note (Addendum)
Patient was alert and watching television during room visit. Mr. [name]'s brother [name] and his friend [name] were at patient's bedside. After receiving permission to speak in front of the patient's visitors, CSW provided patient with bed offers. Mr. [name] stated that he didn't have a decision on a facility today and  would contact CSW as soon as a decision is made. CSW will continue to follow and assist with patient's discharge when medically ready. There were no further questions or concerns at this time.   [name], Student Social Work Intern  [facility]  [phone]

## 2015-11-10 NOTE — Progress Notes (Signed)
No charge note.  Spoke with HCPOA this morning.  Extended family meeting scheduled for 4/26 to prepare the family for the next several months and finalize GOC.  Algis DownsMarianne York, [place] JerseyPA-C Palliative Medicine Pager: [phone]

## 2015-11-10 NOTE — Progress Notes (Signed)
Late entry:  Patient complaining of 7 out of 10 abdominal pain.  Refused PRN tylenol for pain and requested a stronger medication.  Notified MD on-call.  New orders received.  Will administer medication. Will continue to monitor.

## 2015-11-10 NOTE — Progress Notes (Signed)
Critical lab Mg 0.9, Dr. Cameron Alihungal was paged, got new order to administer Mg 4g IVPB.

## 2015-11-10 NOTE — Progress Notes (Signed)
Nutrition Follow-up  DOCUMENTATION CODES:   Non-severe (moderate) malnutrition in context of chronic illness  INTERVENTION:  Provide 30 ml Prostat po BID, each supplement provides 100 kcal and 15 grams of protein.   Encourage adequate PO intake.   NUTRITION DIAGNOSIS:   Increased nutrient needs related to chronic illness, wound healing as evidenced by estimated needs; ongoing  GOAL:   Patient will meet greater than or equal to 90% of their needs; not met  MONITOR:   PO intake, Supplement acceptance, Labs, Weight trends, I & O's  REASON FOR ASSESSMENT:   Consult Assessment of nutrition requirement/status  ASSESSMENT:   55 yo Male wiht PMH of EtOH abuse with associated liver disease, HTN, OSA. Was brought to ED for c/o dyspnea that quickly progressed to obtundation. He required urgent intubation in the ED for airway protection. He developed hypotension post-intubation, started on norepi. Labs revealed severe anemia, hgb 2.7 and thrombocytopenia. PRBC transfusing. No obvious source of bleeding and no bleeding reported by family. A foley was attempted by unable to be placed - ? Bladder abnormality present. Lactic acid 14, acute renal failure. INR 4.35.   Meal completion has been varied from 20-100%. Pt reports having a lack of appetite during time of visit. RD to order Prostat to aid in protein needs. Pt encouraged to eat his food at meals. Goals of care being discussed per Palliative care. Full scope treatment for now. Plans to d/c to SNF possibly later this week once pt diuresed.   Labs and medications reviewed.   Diet Order:  Diet regular Room service appropriate?: Yes; Fluid consistency:: Thin; Fluid restriction:: 1200 mL Fluid  Skin:  Wound (see comment) (Stage II to buttocks, coccyx, and elbow)  Last BM:  4/25  Height:   Ht Readings from Last 1 Encounters:  11/10/15 6' (1.829 m)    Weight:   Wt Readings from Last 1 Encounters:  11/10/15 242 lb 4.8 oz (109.907 kg)     Ideal Body Weight:  81 kg  BMI:  Body mass index is 32.85 kg/(m^2).  Estimated Nutritional Needs:   Kcal:  2150-2350  Protein:  120-130 gm  Fluid:  1.2 L/day  EDUCATION NEEDS:   No education needs identified at this time  Corrin Parker, MS, RD, LDN Pager # [phone] After hours/ weekend pager # [phone]

## 2015-11-10 NOTE — Progress Notes (Signed)
PROGRESS NOTE                                                                                                                                                                                                             Patient Demographics:    [name], is a 55 y.o. male, DOB - Nov 19, 1960, ZOX:[id number]  Admit date - 10/31/2015   Admitting Physician Leslye Peer, MD  Outpatient Primary MD for the patient is [name], [name]  LOS - 10  Outpatient Specialists: None  Chief Complaint  Patient presents with  . Shortness of Breath       Brief Narrative   55 year old male with history of alcoholic cirrhosis, hypertension, OSA (not on CPAP) who was brought to the ED for dyspnea and quickly became obtunded. He was urgently intubated in the ED. Postintubation became hypotensive and was started on norepinephrine. Labs showed severe anemia with hemoglobin of 2.7 and some cytopenia. Was transferred with several units of PRBC and FFP. No source of bleeding noted. He was found to have severely elevated lactic acid of 14 with acute kidney injury and elevated INR of 4.35. He was started on empiric vancomycin and cefepime and then switch to Zosyn. Patient also developed SVT on 4/16 and was started on amiodarone drip. Pressures were discontinued and patient extubated on 4/17. On 4/19 patient had therapeutic paracentesis with 3.3 L yellow fluid removed for increasing ascites and respiratory distress. Transferred to  hospitalist service on 4/21. CT abdomen and pelvis done on 4/21 showed large hematoma in the left upper thigh.     Subjective:   Still having low-grade fever.paracentesis repeated negative for SBP.   Assessment  & Plan :   Principal problem Hypovolemic shock  due to severe Blood loss anemia and? DIC.Marland Kitchen Received several units of PRBC this hospitalization. Now stable, lactic acidosis resolved. Continue Protonix.  Hemoglobin improved after 1 unit PRBC yesterday.   Active Problems: Acute hypoxic respiratory failure with shock Secondary to hypovolemic shock, metabolic acidosis and possible pneumonia and pulmonary edema. No signs of respiratory distress and maintaining O2 sat on room air. Significant positive balance with pulmonary edema. On aggressive diuresis with IV Lasix 80 mg every 6 hours. Monitor strict I/O. Scheduled nebs. Continue bedside spirometry. Diuresing well.  Normal weight of 220 lbs ( still 22 lbs over)  Decompensated  alcoholic hep C cirrhosis with anasarca and acute encephalopathy Therapeutic paracentesis done on 4/17 with 3.3 L removed and placed on aggressive IV Lasix.  abdominal distension is likely from severe third spacing seen on CT. Encephalopathy resolved. Has persistent hyperbilirubinemia. Repeat paracentesis on 4/24 with 1.8 L removed. Negative for SBP. Aggressive diuresis for anasarca. Given IV albumin on 4/21 and 4/25. Seen by palliative care. Long term prognosis is guarded. Recommend  palliative care follow up at SNF.   Severe Anemia and thrombocytopenia ? DIC with cirrhosis. Also found to have large left thigh hematoma on CT which is contribution. monitor with transfuse prn.Hemoccult-positive. elevated LDH and fibrinogen. Hematology consulted , recommended severe anemia and thrombocytopenia is contributed by bone marrow suppression as well. Elevated LDH and haptoglobin difficult to interpret in a cirrhotic patient. Recommend supportive care. Platelets slowly improving.  Appreciate help from GI. Have signed off with plan on outpatient follow-up.  Fever on since 4/24 MAXIMUM TEMPERATURE of 100.7. Chest x-ray showing atelectasis versus infiltrate. UA unremarkable. Blood cultures so far negative.   diagnostic and therapeutic paracentesis done negative for  SBP.  Placed  on empiric IV Rocephin and azithromycin.  Sinus tachycardia with SVT versus A. fib Required amiodarone  drip in the ICU. Now with better control. Switched to by mouth amiodarone. Keep K >4 and mag >2.  2-D echo shows EF of 50-50% without wall motion abnormality.  Acute kidney injury Possibly due to hypovolemic shock, now with cirrhosis and pulmonary edema. Getting aggressive diuresis with Lasix. Renal function slowly improving.    Hypoglycemia Possibly due to shock and poor by mouth intake. Resolved.  Severe Hypokalemia / Hypomagnesemia Being replenished aggressively. ( on daily supplements). Monitor labs daily  ? Colitis on CT abdomen Colonic wall thickening noted. GI recommend this is more edematous change and does not need intervention.     Code Status : DO NOT RESUSCITATE  Family Communication  : spoke with brother on 4/24  Disposition Plan  : PT recommend skilled nursing facility. Possibly later this week or early next week  once diuresed adequately Needs SNF with palliative care follow up  Barriers For Discharge : Ongoing anasarca,  persistent anemia requiring transfusion  Consults  :   PC CM (signed off ) [facility] (signed off) hematology ( Dr Pamelia Hoit, signed off) palliative care   Procedures  :  Intubation 2-D echo CT head, ultrasound abdomen Therapeutic paracentesis  CT abdomen  and pelvis, CT pelvis   DVT Prophylaxis  :  SCD's  Lab Results  Component Value Date   PLT 142* 11/10/2015    Antibiotics  :  Completed  Anti-infectives    Start     Dose/Rate Route Frequency Ordered Stop   11/10/15 1000  azithromycin (ZITHROMAX) tablet 500 mg     500 mg Oral Daily 11/10/15 0737     11/09/15 0945  cefTRIAXone (ROCEPHIN) 2 g in dextrose 5 % 50 mL IVPB     2 g 100 mL/hr over 30 Minutes Intravenous Every 24 hours 11/09/15 0837     11/04/15 1400  piperacillin-tazobactam (ZOSYN) IVPB 3.375 g  Status:  Discontinued     3.375 g 12.5 mL/hr over 240 Minutes Intravenous Every 8 hours 11/04/15 1059 11/05/15 1021   11/04/15 1130  metroNIDAZOLE (FLAGYL) IVPB 500 mg   Status:  Discontinued     500 mg 100 mL/hr over 60 Minutes Intravenous Every 8 hours 11/04/15 1032 11/04/15 1059   11/01/15 1600  ceFEPIme (MAXIPIME) 2 g in dextrose  5 % 50 mL IVPB  Status:  Discontinued     2 g 100 mL/hr over 30 Minutes Intravenous Every 24 hours 10/31/15 1715 11/04/15 1059   11/01/15 0400  vancomycin (VANCOCIN) IVPB 750 mg/150 ml premix  Status:  Discontinued     750 mg 150 mL/hr over 60 Minutes Intravenous Every 12 hours 10/31/15 1715 11/02/15 1206   10/31/15 1600  ceFEPIme (MAXIPIME) 2 g in dextrose 5 % 50 mL IVPB     2 g 100 mL/hr over 30 Minutes Intravenous  Once 10/31/15 1521 10/31/15 1650   10/31/15 1600  vancomycin (VANCOCIN) 1,750 mg in sodium chloride 0.9 % 500 mL IVPB     1,750 mg 250 mL/hr over 120 Minutes Intravenous NOW 10/31/15 1521 10/31/15 1814        Objective:   Filed Vitals:   11/09/15 2018 11/10/15 0601 11/10/15 0813 11/10/15 1155  BP: 117/81 125/79 122/70   Pulse: 98 103 105   Temp: 99.7 F (37.6 C) 99.9 F (37.7 C) 99.5 F (37.5 C)   TempSrc: Axillary Oral Oral   Resp: 18 18 17    Height:    6' (1.829 m)  Weight:    109.907 kg (242 lb 4.8 oz)  SpO2: 98% 97% 95%     Wt Readings from Last 3 Encounters:  11/10/15 109.907 kg (242 lb 4.8 oz)  11/28/14 89.268 kg (196 lb 12.8 oz)  11/14/14 90.901 kg (200 lb 6.4 oz)     Intake/Output Summary (Last 24 hours) at 11/10/15 1257 Last data filed at 11/10/15 1049  Gross per 24 hour  Intake    860 ml  Output   2276 ml  Net  -1416 ml     Physical Exam  Gen:  not in distress HEENT: Pallor present,  Icteric , moist mucosa, supple neck,  Chest: Clear breath sounds bilaterally,  No wheezing CVS: N S1&S2, no murmurs, rubs or gallop GI: Distended with ascites, nontender, foley + Musculoskeletal: warm, 2+ pitting edema bilaterally with anasarca, , scrotal edema improving, erythema over left anterior thigh ( from hematoma) CNS: Alert and oriented,    Data Review:    CBC  Recent  Labs Lab 11/03/15 2245  11/04/15 1805 11/05/15 0745  11/06/15 0515 11/06/15 1109 11/07/15 0520 11/08/15 0636 11/09/15 0530 11/10/15 0600  WBC 8.1  < > 10.1 7.5  < > 7.9  --  9.6 11.7* 12.7* 13.6*  HGB 5.7*  < > 7.2* 6.2*  < > 6.9* 7.5* 7.1* 7.1* 7.0* 8.3*  HCT 16.8*  < > 20.2* 18.0*  < > 20.0* 21.5* 20.9* 21.2* 20.6* 25.0*  PLT 63*  < > 66* 55*  < > 67*  --  109* 123* 137* 142*  MCV 84.0  < > 83.5 82.2  < > 82.0  --  83.6 83.8 85.5 84.2  MCH 28.5  < > 29.8 28.3  < > 28.3  --  28.4 28.1 29.0 27.9  MCHC 33.9  < > 35.6 34.4  < > 34.5  --  34.0 33.5 34.0 33.2  RDW 16.9*  < > 16.2* 16.2*  < > 16.0*  --  16.2* 16.7* 17.4* 17.2*  LYMPHSABS 0.7  --  0.8 0.6*  --   --   --   --   --   --   --   MONOABS 1.8*  --  3.1* 2.2*  --   --   --   --   --   --   --  EOSABS 0.1  --  0.2 0.3  --   --   --   --   --   --   --   BASOSABS 0.0  --  0.0 0.0  --   --   --   --   --   --   --   < > = values in this interval not displayed.  Chemistries   Recent Labs Lab 11/04/15 1805 11/05/15 0552 11/06/15 0515 11/06/15 1452 11/07/15 0520 11/08/15 0636 11/09/15 0530 11/09/15 0930 11/10/15 0600  NA 136 135 135  --  135 135 134*  --  136  K 3.0* 3.0* 2.9*  --  3.1* 3.0* 3.0*  --  3.0*  CL 107 105 106  --  105 102 101  --  102  CO2 18* 18* 20*  --  21* 21* 21*  --  22  GLUCOSE 141* 132* 112*  --  131* 93 133*  --  93  BUN 39* 37* 36*  --  34* 33* 31*  --  28*  CREATININE 2.58* 2.32* 2.13*  --  1.79* 1.60* 1.58*  --  1.57*  CALCIUM 7.4* 7.4* 7.8*  --  7.8* 8.1* 7.9*  --  7.8*  MG 1.7  --   --   --  1.0* 1.3*  --  1.3* 0.9*  AST  --  144* 109*  --  85*  --   --   --  63*  ALT  --  73* 69*  --  58  --   --   --  41  ALKPHOS  --  59 66  --  66  --   --   --  74  BILITOT  --  6.8* 8.0* 8.8* 10.0*  --   --   --  13.0*   ------------------------------------------------------------------------------------------------------------------ No results for input(s): CHOL, HDL, LDLCALC, TRIG, CHOLHDL,  LDLDIRECT in the last 72 hours.  No results found for: HGBA1C ------------------------------------------------------------------------------------------------------------------ No results for input(s): TSH, T4TOTAL, T3FREE, THYROIDAB in the last 72 hours.  Invalid input(s): FREET3 ------------------------------------------------------------------------------------------------------------------ No results for input(s): VITAMINB12, FOLATE, FERRITIN, TIBC, IRON, RETICCTPCT in the last 72 hours.  Coagulation profile  Recent Labs Lab 11/04/15 0623 11/05/15 0552  INR 2.77* 2.63*    No results for input(s): DDIMER in the last 72 hours.  Cardiac Enzymes No results for input(s): CKMB, TROPONINI, MYOGLOBIN in the last 168 hours.  Invalid input(s): CK ------------------------------------------------------------------------------------------------------------------    Component Value Date/Time   BNP 694.4* 10/31/2015 1911    Inpatient Medications  Scheduled Meds: . sodium chloride   Intravenous Once  . sodium chloride   Intravenous Once  . sodium chloride   Intravenous Once  . sodium chloride   Intravenous Once  . sodium chloride   Intravenous Once  . sodium chloride   Intravenous Once  . sodium chloride   Intravenous Once  . sodium chloride   Intravenous Once  . sodium chloride   Intravenous Once  . amiodarone  200 mg Oral Daily  . azelastine  2 spray Each Nare BID  . azithromycin  500 mg Oral Daily  . cefTRIAXone (ROCEPHIN)  IV  2 g Intravenous Q24H  . folic acid  1 mg Oral Daily  . furosemide  80 mg Intravenous Q6H  . insulin aspart  0-9 Units Subcutaneous 6 times per day  . magnesium oxide  400 mg Oral BID  . magnesium sulfate 1 - 4 g bolus IVPB  4 g Intravenous Once  .  pantoprazole  40 mg Oral Daily  . pneumococcal 23 valent vaccine  0.5 mL Intramuscular Tomorrow-1000  . potassium chloride  40 mEq Oral BID  . thiamine  100 mg Oral Daily   Continuous Infusions:  PRN  Meds:.acetaminophen, albuterol, loratadine, ondansetron, sodium chloride flush, sodium chloride flush  Micro Results Recent Results (from the past 240 hour(s))  Blood culture (routine x 2)     Status: None   Collection Time: 10/31/15  3:30 PM  Result Value Ref Range Status   Specimen Description BLOOD RIGHT HAND  Final   Special Requests IN PEDIATRIC BOTTLE  Final   Culture NO GROWTH 5 DAYS  Final   Report Status 11/05/2015 FINAL  Final  Blood culture (routine x 2)     Status: None   Collection Time: 10/31/15  3:56 PM  Result Value Ref Range Status   Specimen Description BLOOD RIGHT HAND  Final   Special Requests BOTTLES DRAWN AEROBIC AND ANAEROBIC 5CC  Final   Culture NO GROWTH 5 DAYS  Final   Report Status 11/05/2015 FINAL  Final  Urine culture     Status: None   Collection Time: 10/31/15  7:14 PM  Result Value Ref Range Status   Specimen Description URINE, CATHETERIZED  Final   Special Requests NONE  Final   Culture NO GROWTH 2 DAYS  Final   Report Status 11/02/2015 FINAL  Final  MRSA PCR Screening     Status: None   Collection Time: 10/31/15  8:45 PM  Result Value Ref Range Status   MRSA by PCR NEGATIVE NEGATIVE Final    Comment:        The GeneXpert MRSA Assay (FDA approved for NASAL specimens only), is one component of a comprehensive MRSA colonization surveillance program. It is not intended to diagnose MRSA infection nor to guide or monitor treatment for MRSA infections.   Culture, respiratory (NON-Expectorated)     Status: None   Collection Time: 11/01/15 12:22 PM  Result Value Ref Range Status   Specimen Description SPUTUM  Final   Special Requests NONE  Final   Gram Stain   Final    ABUNDANT WBC PRESENT, PREDOMINANTLY PMN FEW SQUAMOUS EPITHELIAL CELLS PRESENT FEW GRAM POSITIVE COCCI IN PAIRS FEW GRAM POSITIVE RODS THIS SPECIMEN IS ACCEPTABLE FOR SPUTUM CULTURE Performed at [facility]    Culture   Final    NORMAL OROPHARYNGEAL  FLORA Performed at [facility]    Report Status 11/03/2015 FINAL  Final  Culture, body fluid-bottle     Status: None   Collection Time: 11/04/15  1:04 PM  Result Value Ref Range Status   Specimen Description FLUID PERITONEAL  Final   Special Requests NONE  Final   Culture NO GROWTH 5 DAYS  Final   Report Status 11/09/2015 FINAL  Final  Gram stain     Status: None   Collection Time: 11/04/15  1:04 PM  Result Value Ref Range Status   Specimen Description FLUID PERITONEAL  Final   Special Requests NONE  Final   Gram Stain   Final    FEW WBC PRESENT,BOTH PMN AND MONONUCLEAR NO ORGANISMS SEEN    Report Status 11/04/2015 FINAL  Final  Culture, Urine     Status: None   Collection Time: 11/09/15  4:40 AM  Result Value Ref Range Status   Specimen Description URINE, RANDOM  Final   Special Requests NONE  Final   Culture NO GROWTH 1 DAY  Final  Report Status 11/10/2015 FINAL  Final  Culture, blood (routine x 2)     Status: None (Preliminary result)   Collection Time: 11/09/15  5:26 AM  Result Value Ref Range Status   Specimen Description BLOOD LEFT ANTECUBITAL  Final   Special Requests BOTTLES DRAWN AEROBIC AND ANAEROBIC 10CC EA  Final   Culture NO GROWTH 1 DAY  Final   Report Status PENDING  Incomplete  Culture, blood (routine x 2)     Status: None (Preliminary result)   Collection Time: 11/09/15  5:31 AM  Result Value Ref Range Status   Specimen Description BLOOD RIGHT ANTECUBITAL  Final   Special Requests BOTTLES DRAWN AEROBIC AND ANAEROBIC 10CC EA  Final   Culture NO GROWTH 1 DAY  Final   Report Status PENDING  Incomplete  Culture, body fluid-bottle     Status: None (Preliminary result)   Collection Time: 11/09/15 10:04 AM  Result Value Ref Range Status   Specimen Description FLUID PERITONEAL  Final   Special Requests BOTTLES DRAWN AEROBIC AND ANAEROBIC 5CC  Final   Culture NO GROWTH 1 DAY  Final   Report Status PENDING  Incomplete  Gram stain     Status: None    Collection Time: 11/09/15 10:04 AM  Result Value Ref Range Status   Specimen Description FLUID PERITONEAL  Final   Special Requests NONE  Final   Gram Stain   Final    FEW WBC PRESENT,BOTH PMN AND MONONUCLEAR NO ORGANISMS SEEN    Report Status 11/09/2015 FINAL  Final    Radiology Reports Ct Abdomen Pelvis Wo Contrast  11/06/2015  CLINICAL DATA:  Edema. Decreasing hemoglobin. Cirrhotic patient with hemoccult positive stool. EXAM: CT ABDOMEN AND PELVIS WITHOUT CONTRAST TECHNIQUE: Multidetector CT imaging of the abdomen and pelvis was performed following the standard protocol without IV contrast. COMPARISON:  Abdominal ultrasound 10/31/2015 FINDINGS: Lower chest: Right lung base consolidation with air bronchograms. Minimal left basilar opacity. Small subpulmonic left pleural effusion. Mild cardiomegaly. Decreased density of blood pool consistent with anemia. Liver: Nodular contours with cirrhotic morphology. No focal lesion detected on this unenhanced exam. Hepatobiliary: Gallbladder physiologically distended. There is high-density material within the dependent gallbladder and cystic duct. Common bile duct is not well-defined. Pancreas: No ductal dilatation. Spleen: Normal in size, maximal mention 12.8 cm. Adrenal glands: No nodule. Kidneys: No hydronephrosis or urolithiasis. Stomach/Bowel: Ingested contents in the stomach. Small hiatal hernia. Limited bowel assessment given intra-abdominal ascites. No large or small bowel obstruction. There is questionable colonic wall thickening at the splenic flexure of the colon. Formed stool in the ascending colon. Appendix tentatively but not definitively identified. Vascular/Lymphatic: Normal caliber abdominal aorta with trace atherosclerosis. No retroperitoneal adenopathy. Limited assessment for intra-abdominal adenopathy given mesenteric edema. Reproductive: Prostate gland normal in size. Bladder: Decompressed by Foley catheter. Other: Severe whole body wall edema  and skin thickening. Moderate intra-abdominal ascites. Diffuse mesenteric edema and small volume mesenteric free fluid. No free air. Musculoskeletal: There are no acute or suspicious osseous abnormalities. Degenerative change involving the left greater than right hip and throughout the spine. IMPRESSION: 1. Probable colonic wall thickening at the splenic flexure of the colon, limited bowel evaluation given lack of enteric contrast and intra-abdominal ascites. This may be reactive or colitis. Ischemic colitis can affect this region, however there is only minimal atherosclerosis. Infectious or inflammatory etiologies are also considered. 2. Severe body wall edema, skin thickening, and mesenteric edema. Findings consistent with severe third-spacing. 3. Cirrhotic hepatic morphology. Moderate intra-abdominal ascites. No  splenomegaly. 4. Right greater than left lung base consolidation with air bronchograms, pneumonia versus atelectasis. Trace left pleural effusion. 5. High-density material within the gallbladder and cystic duct, likely sludge, as no stones were seen on recent abdominal ultrasound. Electronically Signed   By: Rubye Oaks M.D.   On: 11/06/2015 19:06   Ct Head Wo Contrast  10/31/2015  CLINICAL DATA:  Alcohol abuse. Dyspnea requiring intubation. Altered mental status. Hypotensive post intubation requiring norepinephrine. EXAM: CT HEAD WITHOUT CONTRAST TECHNIQUE: Contiguous axial images were obtained from the base of the skull through the vertex without intravenous contrast. COMPARISON:  11/12/2013 head CT. FINDINGS: Two oral route tubes enter the upper trachea and hypopharynx on the lateral scout topogram. Mild fluid is seen layering in the nasopharynx. No evidence of parenchymal hemorrhage or extra-axial fluid collection. No mass lesion, mass effect, or midline shift. No CT evidence of acute infarction. Intracranial atherosclerosis. Nonspecific mild subcortical and periventricular white matter  hypodensity, most in keeping with chronic small vessel ischemic change. Cerebral volume is age appropriate. No ventriculomegaly. The visualized paranasal sinuses are essentially clear. The mastoid air cells are unopacified. No evidence of calvarial fracture. IMPRESSION: 1.  No evidence of acute intracranial abnormality. 2. Intracranial atherosclerosis and mild chronic small vessel ischemia. Electronically Signed   By: Delbert Phenix M.D.   On: 10/31/2015 20:33   US Abdomen Complete  10/31/2015  CLINICAL DATA:  Inpatient with cirrhosis, shock and renal failure. EXAM: ABDOMEN ULTRASOUND COMPLETE COMPARISON:  01/07/2011 CT abdomen/ pelvis. FINDINGS: Gallbladder: No gallstones or wall thickening visualized. No sonographic Murphy sign noted by sonographer. Common bile duct: Diameter: 4 mm Liver: Liver parenchyma is diffusely heterogeneous in echotexture in the liver surface is diffusely irregular in contour, in keeping with cirrhosis. There is reversal of flow in the main portal vein. No liver masses demonstrated. IVC: Not visualized. Pancreas: Not well visualized due to overlying bowel gas and other patient related factors for Spleen: Size and appearance within normal limits. Right Kidney: Length: 10.6 cm. Echogenicity within normal limits. No mass or hydronephrosis visualized. Left Kidney: Length: 11.2 cm. Echogenicity within normal limits. No mass or hydronephrosis visualized. Abdominal aorta: No aneurysm visualized. Other findings: Moderate volume ascites. IMPRESSION: 1. Cirrhosis.  No liver mass detected. 2. Reversed (hepatofugal) flow in the main portal vein. Moderate volume ascites. No splenomegaly . 3. Normal gallbladder with no cholelithiasis. No biliary ductal dilatation. Electronically Signed   By: Delbert Phenix M.D.   On: 10/31/2015 23:58   Ct Femur Left Wo Contrast  11/06/2015  CLINICAL DATA:  Chronic gastrointestinal bleeding. Swelling in the left groin and femur. Question hematoma. Initial encounter.  EXAM: CT OF THE LEFT FEMUR WITHOUT CONTRAST TECHNIQUE: Multidetector CT imaging was performed according to the standard protocol. Multiplanar CT image reconstructions were also generated. COMPARISON:  None. FINDINGS: Imaging incidentally includes the right upper leg. Diffuse and intense subcutaneous edema is identified throughout. The short adductor and adductor magnus on the left are markedly swollen and hyperattenuating consistent with the presence of hematoma. Discrete measurement is not possible but the hematoma measures approximately 6.3 cm AP by 8.1 cm transverse by 14.2 cm craniocaudal. No other hematoma is identified. No soft tissue gas collection is seen. Partial visualization of degenerative change about the hips. IMPRESSION: Large appearing hematoma in the adductor musculature of the left upper leg cannot be discretely measured. Diffuse and intense subcutaneous edema in the lower pelvis and upper legs bilaterally. No focal bony abnormality. Electronically Signed   By: Drusilla Kanner  M.D.   On: 11/06/2015 17:52   US Paracentesis  11/09/2015  INDICATION: Ascites secondary to alcoholic cirrhosis. Request for diagnostic and therapeutic paracentesis. EXAM: ULTRASOUND GUIDED LEFT LOWER QUADRANT PARACENTESIS MEDICATIONS: 1% Lidocaine. COMPLICATIONS: None immediate. PROCEDURE: Informed written consent was obtained from the patient after a discussion of the risks, benefits and alternatives to treatment. A timeout was performed prior to the initiation of the procedure. Initial ultrasound scanning demonstrates a large amount of ascites within the right lower abdominal quadrant. The right lower abdomen was prepped and draped in the usual sterile fashion. 1% lidocaine with epinephrine was used for local anesthesia. Following this, a 19 gauge, 10-cm, Yueh catheter was introduced. An ultrasound image was saved for documentation purposes. The paracentesis was performed. The catheter was removed and a dressing was  applied. The patient tolerated the procedure well without immediate post procedural complication. FINDINGS: A total of approximately 1.8 liters of clear yellow fluid was removed. Samples were sent to the laboratory as requested by the clinical team. IMPRESSION: Successful ultrasound-guided paracentesis yielding 1.8 liters of peritoneal fluid. Read by:  Corrin Parker, PA-C Electronically Signed   By: Richarda Overlie M.D.   On: 11/09/2015 11:15   Dg Chest Port 1 View  11/09/2015  CLINICAL DATA:  Fever, shortness of breath, hypertension EXAM: PORTABLE CHEST 1 VIEW COMPARISON:  11/06/2015 FINDINGS: Low lung volumes with bibasilar atelectasis or infiltrates. Heart is mildly enlarged. Mild vascular congestion. No visible effusions. Left central line remains in place, unchanged. IMPRESSION: Cardiomegaly, mild vascular congestion. Bibasilar atelectasis or infiltrates. No real change since prior study. Electronically Signed   By: Charlett Nose M.D.   On: 11/09/2015 07:37   Dg Chest Port 1 View  11/06/2015  CLINICAL DATA:  Pulmonary edema, acute respiratory failure, cirrhosis, renal failure EXAM: PORTABLE CHEST 1 VIEW COMPARISON:  Portable chest x-ray of November 05, 2015 FINDINGS: The lungs are mildly hypoinflated. Parenchymal density medially at the right lung base persists. The cardiac silhouette is top-normal in size. The central pulmonary vascularity is mildly prominent. No pleural effusion or pneumothorax is observed. The left internal jugular venous catheter tip projects over the mid portion of the SVC. IMPRESSION: Mild pulmonary interstitial prominence stable likely reflecting mild interstitial edema. Confluent alveolar opacity at the right lung base medially consistent with atelectasis or pneumonia. No significant pleural effusion and no pneumothorax. Electronically Signed   By: David  [place] M.D.   On: 11/06/2015 07:20   Dg Chest Port 1 View  11/05/2015  CLINICAL DATA:  Dyspnea EXAM: PORTABLE CHEST 1 VIEW COMPARISON:   11/02/2015 FINDINGS: Left internal jugular central line again identified. Tip obscured by overlying EKG leads. Heart size and vascular pattern normal. Low lung volumes. Bilateral lower lobe opacities right worse than left. There has been mild improvement in bilateral lung base aeration. IMPRESSION: Persistent but improved bibasilar opacities. Electronically Signed   By: Esperanza Heir M.D.   On: 11/05/2015 07:11   Dg Chest Port 1 View  11/02/2015  CLINICAL DATA:  55 year old male with respiratory failure. Subsequent encounter. EXAM: PORTABLE CHEST 1 VIEW COMPARISON:  11/01/2015. FINDINGS: Endotracheal tube tip 3.6 cm above the carina. Left central line is in place with the tip angulated possibly entering the azygos vein unchanged from prior exam. Lateral view be necessary for further delineation. No gross pneumothorax. Nasogastric tube courses below the diaphragm. Tip is not included on the present exam. Asymmetric airspace disease greatest lung bases more notable on the right may represent pulmonary edema with basilar atelectasis, pleural  fluid or infiltrate. Heart size top-normal. Calcified aorta. IMPRESSION: Similar appearance of asymmetric airspace disease most notable lung bases greater on the right. Left central line tip possibly entering the azygos vein. Please see above. Electronically Signed   By: Lacy Duverney M.D.   On: 11/02/2015 08:07   Dg Chest Port 1 View  11/01/2015  CLINICAL DATA:  Central line placement. EXAM: PORTABLE CHEST 1 VIEW COMPARISON:  Yesterday. FINDINGS: Endotracheal tube in satisfactory position. Nasogastric tube extending into the stomach. Interval left jugular catheter with its tip directed superiorly. Poor inspiration. Normal sized heart. Mild increase in bibasilar opacity. Unremarkable bones. IMPRESSION: 1. Poor inspiration with increased bibasilar atelectasis. 2. Left jugular catheter tip directed superiorly. This may be in the right innominate vein, azygos vein or proximal  superior vena cava. 3. No pneumothorax. Electronically Signed   By: Beckie Salts M.D.   On: 11/01/2015 07:13   Dg Chest Portable 1 View  10/31/2015  CLINICAL DATA:  56 year old male status post intubation. EXAM: PORTABLE CHEST 1 VIEW COMPARISON:  Chest x-ray 11/13/2013. FINDINGS: An endotracheal tube is in place with tip 2.8 cm above the carina. A nasogastric tube is seen extending into the stomach, however, the tip of the nasogastric tube extends below the lower margin of the image. Lung volumes are very low. Opacity at the right base presumably reflects atelectasis in the right middle and lower lobes. Left lung appears relatively clear with minimal subsegmental atelectasis in the left lower lobe. No definite pleural effusions. No evidence of pulmonary edema. Heart size is normal. The patient is rotated to the right on today's exam, resulting in distortion of the mediastinal contours and reduced diagnostic sensitivity and specificity for mediastinal pathology. IMPRESSION: 1. Support apparatus, as above. 2. Low lung volumes with extensive atelectasis in the right middle and lower lobes. Electronically Signed   By: Trudie Reed M.D.   On: 10/31/2015 15:47    Time Spent in minutes  25   Eddie North M.D on 11/10/2015 at 12:57 PM  Between 7am to 7pm - Pager - [phone]  After 7pm go to [email or web address] - password [facility]  [facility] -  Office  [phone]

## 2015-11-10 NOTE — Progress Notes (Signed)
Daily Progress Note   Patient Name: [name]       Date: 11/10/2015 DOB: 09/01/1960  Age: 55 y.o. MRN#: [id number] Attending Physician: Bradley NorthNishant Dhungel, MD Primary Care Physician: [name], Edward, PA-C Admit Date: 10/31/2015  55 yo male with PMH of Hep C cirrhosis, OSA, HTN, and anemia.  Admitted 4/15 with hypovolemic shock and a lactic acid of 14.  He was intubated / extubated.  Found to be in SVT requiring an amiodarone drip.  He has decompensated Hep C cirrhosis with acute renal failure and has developed a large hematoma on his left thigh.    Reason for Consultation/Follow-up: Disposition, Establishing goals of care and Psychosocial/spiritual support  Subjective:    Interval Events: Spoke at length with Amgen IncBrother [name].  [name] truly appears to understand his brother's illness.  He states that [name] wants to go back to his own home so that he can drink alcohol again.  [name] is trying to prepare the family that end stage liver disease is a terminal.  I offered a family meeting to help both the family and the patient understand his condition and prognosis.  I shared with [name] that [name] is likely Hospice eligible and recommended that [facility] discharge. [name] shared that there his no official paper work Conservation officer, historic buildingsdesignating him as Product managerHCPOA and he would appreciate our help in completing that paper work.   Length of Stay: 10 days  Current Medications: Scheduled Meds:  . sodium chloride   Intravenous Once  . sodium chloride   Intravenous Once  . sodium chloride   Intravenous Once  . sodium chloride   Intravenous Once  . sodium chloride   Intravenous Once  . sodium chloride   Intravenous Once  . sodium chloride   Intravenous Once  . sodium chloride    Intravenous Once  . sodium chloride   Intravenous Once  . amiodarone  200 mg Oral Daily  . azelastine  2 spray Each Nare BID  . azithromycin  500 mg Oral Daily  . cefTRIAXone (ROCEPHIN)  IV  2 g Intravenous Q24H  . folic acid  1 mg Oral Daily  . furosemide  80 mg Intravenous Q6H  . insulin aspart  0-9 Units Subcutaneous 6 times per day  . magnesium oxide  400 mg Oral  BID  . pantoprazole  40 mg Oral Daily  . pneumococcal 23 valent vaccine  0.5 mL Intramuscular Tomorrow-1000  . potassium chloride  40 mEq Oral BID  . thiamine  100 mg Oral Daily    Continuous Infusions:    PRN Meds: acetaminophen, albuterol, loratadine, ondansetron, sodium chloride flush, sodium chloride flush   Physical Exam       Very pleasant appearing male, NAD, eating lunch.  Sister in law at bedside Lower extremities with 3++ pitting.        Vital Signs: BP 122/70 mmHg  Pulse 105  Temp(Src) 99.5 F (37.5 C) (Oral)  Resp 17  Ht 6' (1.829 m)  Wt 112.674 kg (248 lb 6.4 oz)  BMI 33.68 kg/m2  SpO2 95% SpO2: SpO2: 95 % O2 Device: O2 Device: Not Delivered O2 Flow Rate: O2 Flow Rate (L/min): 3 L/min  Intake/output summary:   Intake/Output Summary (Last 24 hours) at 11/10/15 1022 Last data filed at 11/10/15 0730  Gross per 24 hour  Intake    620 ml  Output   3276 ml  Net  -2656 ml   LBM: Last BM Date: 11/10/15 Baseline Weight: Weight: 99.1 kg (218 lb 7.6 oz) Most recent weight: Weight: 112.674 kg (248 lb 6.4 oz)       Palliative Assessment/Data:   Additional Data Reviewed: CBC    Component Value Date/Time   WBC 13.6* 11/10/2015 0600   RBC 2.97* 11/10/2015 0600   RBC 2.70* 11/04/2015 1111   HGB 8.3* 11/10/2015 0600   HCT 25.0* 11/10/2015 0600   PLT 142* 11/10/2015 0600   MCV 84.2 11/10/2015 0600   MCH 27.9 11/10/2015 0600   MCHC 33.2 11/10/2015 0600   RDW 17.2* 11/10/2015 0600   LYMPHSABS 0.6* 11/05/2015 0745   MONOABS 2.2* 11/05/2015 0745   EOSABS 0.3 11/05/2015 0745   BASOSABS 0.0  11/05/2015 0745    CMP     Component Value Date/Time   NA 136 11/10/2015 0600   K 3.0* 11/10/2015 0600   CL 102 11/10/2015 0600   CO2 22 11/10/2015 0600   GLUCOSE 93 11/10/2015 0600   BUN 28* 11/10/2015 0600   CREATININE 1.57* 11/10/2015 0600   CALCIUM 7.8* 11/10/2015 0600   PROT 6.4* 11/07/2015 0520   ALBUMIN 1.9* 11/07/2015 0520   AST 85* 11/07/2015 0520   ALT 58 11/07/2015 0520   ALKPHOS 66 11/07/2015 0520   BILITOT 10.0* 11/07/2015 0520   GFRNONAA 49* 11/10/2015 0600   GFRAA 56* 11/10/2015 0600       Problem List:  Patient Active Problem List   Diagnosis Date Noted  . Goals of care, counseling/discussion   . Bleeding   . Palliative care encounter   . Absolute anemia   . Bilateral lower extremity edema   . Acute respiratory failure with hypoxia (HCC)   . AKI (acute kidney injury) (HCC)   . Alcoholic cirrhosis of liver with ascites (HCC)   . Altered mental status   . Elevated INR   . Lactic acidosis   . Subacute liver failure without hepatic coma   . Protein-calorie malnutrition, severe (HCC) 11/02/2015  . Shock (HCC) 10/31/2015  . Anemia 10/31/2015  . Cirrhosis (HCC) 10/31/2015  . Acute respiratory failure (HCC) 10/31/2015  . Pressure ulcer 10/31/2015  . Thrombocytopenia (HCC) 11/28/2014  . Allergic rhinitis 11/14/2014  . Arthritis 11/14/2014  . Cataracts, bilateral 11/14/2014  . Sleep apnea 11/14/2014  . HTN (hypertension) 11/14/2014  . Neuropathy (HCC) 11/14/2014  . Achilles tendon tear  11/14/2014  . Pedal edema 11/14/2014  . Renal failure 11/12/2013  . Syncope 11/12/2013     Palliative Care Assessment & Plan    Code Status: DNR     Most Recent Value   Type of Advance Directive  Healthcare Power of Attorney   Pre-existing out of facility DNR order (yellow form or pink MOST form)     "MOST" Form in Place?         Goals of Care/Additional Recommendations:  D/C to SNF with Palliative Follow Up when medically appropriate  Full scope  treatment for now.  Meeting with [name], (brother Bradley Sor) and multiple family members on 4/26 to explain his disease and prognosis.  Hopefully we will further delineate his GOC.    Palliative Prophylaxis:   Aspiration, Frequent Pain Assessment and Palliative Wound Care  5. Prognosis: less than 6 months.  6. Discharge Planning:  Skilled Nursing Facility for rehab with Palliative care service follow-up   Care plan was discussed with sister in law at bedside.  Thank you for allowing the Palliative Medicine Team to assist in the care of this patient.   Time In: 9:50 Time Out: 10:30 Total Time 35 min Prolonged Time Billed no        Stephani Police, PA-C  11/10/2015, 10:22 AM  Please contact Palliative Medicine Team phone at [phone] for questions and concerns.

## 2015-11-11 DIAGNOSIS — J9602 Acute respiratory failure with hypercapnia: Secondary | ICD-10-CM

## 2015-11-11 DIAGNOSIS — K7290 Hepatic failure, unspecified without coma: Secondary | ICD-10-CM

## 2015-11-11 DIAGNOSIS — K7682 Hepatic encephalopathy: Secondary | ICD-10-CM | POA: Insufficient documentation

## 2015-11-11 LAB — BASIC METABOLIC PANEL
ANION GAP: 10 (ref 5–15)
BUN: 29 mg/dL — ABNORMAL HIGH (ref 6–20)
CALCIUM: 7.8 mg/dL — AB (ref 8.9–10.3)
CHLORIDE: 104 mmol/L (ref 101–111)
CO2: 25 mmol/L (ref 22–32)
CREATININE: 1.55 mg/dL — AB (ref 0.61–1.24)
GFR calc non Af Amer: 49 mL/min — ABNORMAL LOW (ref >60)
GFR, EST AFRICAN AMERICAN: 57 mL/min — AB (ref >60)
GLUCOSE: 101 mg/dL — AB (ref 65–99)
Potassium: 3.4 mmol/L — ABNORMAL LOW (ref 3.5–5.1)
Sodium: 139 mmol/L (ref 135–145)

## 2015-11-11 LAB — GLUCOSE, CAPILLARY
GLUCOSE-CAPILLARY: 103 mg/dL — AB (ref 65–99)
GLUCOSE-CAPILLARY: 134 mg/dL — AB (ref 65–99)
GLUCOSE-CAPILLARY: 91 mg/dL (ref 65–99)
GLUCOSE-CAPILLARY: 97 mg/dL (ref 65–99)
Glucose-Capillary: 100 mg/dL — ABNORMAL HIGH (ref 65–99)
Glucose-Capillary: 112 mg/dL — ABNORMAL HIGH (ref 65–99)

## 2015-11-11 LAB — CBC
HEMATOCRIT: 25.4 % — AB (ref 39.0–52.0)
HEMOGLOBIN: 8.6 g/dL — AB (ref 13.0–17.0)
MCH: 29.4 pg (ref 26.0–34.0)
MCHC: 33.9 g/dL (ref 30.0–36.0)
MCV: 86.7 fL (ref 78.0–100.0)
Platelets: 141 10*3/uL — ABNORMAL LOW (ref 150–400)
RBC: 2.93 MIL/uL — ABNORMAL LOW (ref 4.22–5.81)
RDW: 18 % — AB (ref 11.5–15.5)
WBC: 13.5 10*3/uL — ABNORMAL HIGH (ref 4.0–10.5)

## 2015-11-11 LAB — MAGNESIUM: Magnesium: 1.3 mg/dL — ABNORMAL LOW (ref 1.7–2.4)

## 2015-11-11 MED ORDER — LACTULOSE 10 GM/15ML PO SOLN
30.0000 g | Freq: Two times a day (BID) | ORAL | Status: DC
  Administered 2015-11-11 – 2015-11-12 (×2): 30 g via ORAL
  Filled 2015-11-11 (×2): qty 45

## 2015-11-11 MED ORDER — DIPHENHYDRAMINE HCL 25 MG PO CAPS
25.0000 mg | ORAL_CAPSULE | Freq: Once | ORAL | Status: DC
  Filled 2015-11-11: qty 1

## 2015-11-11 NOTE — Clinical Social Work Note (Signed)
CSW talked by phone with patient's brother, [name] regarding his brother and d/c planning. A palliative meeting is being held today and [name] plans to attend. CSW informed brother that patient's insurance does not cover SNF for rehab, but may in certain  circumstances pay for one week. [name] is interested in pursuing SNF for patient if insurance will pay for 1 week and his facility choice is [name]. Patient's brother plans to meet with his family regarding the need to assist with looking after patient at home if patient unable to go to a facility for rehab.  CSW will follow-up with patient's brother once [name] ready for discharge.  [name], MSW, LCSW Licensed Clinical Social Worker Clinical Social Work Department [facility] [phone]

## 2015-11-11 NOTE — Progress Notes (Signed)
Physical Therapy Treatment Patient Details Name: [name] MRN: [id number] DOB: 03/12/1961 Today's Date: 11/11/2015    History of Present Illness pt presents Obtunded with Renal Failure and Shock.  pt with hx of Etoh, HTN, OSA, Cataracts, Etoh Hepatitis, Pancreatitis, Ascites, Hepatomegaly, and Neuropathy.  Palliative Care Team following as well    PT Comments    Very good participation; walked in room distance with +2 assist for safety and chair pushed behind; He seemed pleasantly surprised with his ability to walk  Follow Up Recommendations  SNF     Equipment Recommendations  None recommended by PT    Recommendations for Other Services       Precautions / Restrictions Precautions Precautions: Fall    Mobility  Bed Mobility Overal bed mobility: Needs Assistance Bed Mobility: Supine to Sit     Supine to sit: Min assist     General bed mobility comments: Able to "walk" his legs of the EOB in prep for getting up; handheld assist and rials to pull to sit; used bed pad to square off hips at EOB  Transfers Overall transfer level: Needs assistance Equipment used: Rolling walker (2 wheeled) Transfers: Sit to/from Stand Sit to Stand: +2 safety/equipment;Mod assist;Min assist         General transfer comment: Performed 2 sit<>stand transfers, first from bed with min assist; second from low chair with mod assist; somewhat dependent on momentum  Ambulation/Gait Ambulation/Gait assistance: Min assist;+2 safety/equipment Ambulation Distance (Feet): 25 Feet Assistive device: Rolling walker (2 wheeled) Gait Pattern/deviations: Wide base of support;Decreased step length - right;Decreased step length - left;Shuffle   Gait velocity interpretation: Below normal speed for age/gender General Gait Details: noted scrotal edema leading to a very wide stance and step width; feet did not fit inside RW, so walked with trunk flexed with RW in front of him; cues to self-monitor for  activiyt tolerance   Stairs            Wheelchair Mobility    Modified Rankin (Stroke Patients Only)       Balance     Sitting balance-Leahy Scale: Good       Standing balance-Leahy Scale: Poor Standing balance comment: Shaky on his feet today                    Cognition Arousal/Alertness: Awake/alert Behavior During Therapy: WFL for tasks assessed/performed Overall Cognitive Status: Within Functional Limits for tasks assessed                      Exercises      General Comments        Pertinent Vitals/Pain Pain Assessment: No/denies pain (Just exhausted)    Home Living                      Prior Function            PT Goals (current goals can now be found in the care plan section) Acute Rehab PT Goals Patient Stated Goal: Home PT Goal Formulation: With patient Time For Goal Achievement: 11/19/15 Potential to Achieve Goals: Good Progress towards PT goals: Progressing toward goals    Frequency  Min 3X/week    PT Plan Current plan remains appropriate    Co-evaluation             End of Session Equipment Utilized During Treatment: Gait belt Activity Tolerance: Patient tolerated treatment well Patient left: in chair;with call bell/phone within reach  Time: 4098-1191 PT Time Calculation (min) (ACUTE ONLY): 28 min  Charges:  $Gait Training: 8-22 mins $Therapeutic Activity: 8-22 mins                    G Codes:      [name] 11/11/2015, 10:25 AM  [name], PT  Acute Rehabilitation Services Pager [phone] Office [phone]

## 2015-11-11 NOTE — Progress Notes (Addendum)
Daily Progress Note   Patient Name: [name]       Date: 11/11/2015 DOB: [birth year]  Age: 55 y.o. MRN#: [id number] Attending Physician: Penny Pia, MD Primary Care Physician: Marisue Brooklyn Admit Date: 10/31/2015    Reason for Consultation/Follow-up: Establishing goals of care  Subjective: Family meeting as requested by patient's brother Francesco Sor to discuss [name]' liver disease and his immediate future.  A Living Will and HCPOA was completed during this meeting.  A hard copy of it has been placed on the patient's shadow chart.  [name] elected [facility] as his HCPOA.  He has elected not to be resuscitated in the situations noted in the Living Will.  The patient, his daughter [name], and Francesco Sor were in the meeting.  His other daughter [name] in [place] was conferenced in by phone.  We discussed his condition - advanced liver disease - which had caused coagulopathy - which lead to severe anemia, SVT, and acute on chronic kidney failure.  We discussed symptoms associated with liver disease including fluid accumulation, bleeding/coagulopathy, and confusion/encephalopathy.  We discussed prevention of the symptoms as well as treatments for them.  We discussed the difficulties that diuresis can cause in the setting of CKD.  We discussed what encephalopathy looks like, what causes it and how to treat it. We discussed alcohol avoidance.  We talked about prognosis.  Finally we discussed some of what is required for a liver transplant.    The patient was lethargic and dozed during our discussion.    Multiple questions from the family were answered.  Outside of the meeting, Francesco Sor expressed concern to me that the patient's girl friend and friends may enable his alcoholism.  Francesco Sor feels very  strongly that if [name] returns to his home now he will immediate start drinking again and be back in the hospital in a week.  Artha' family is very supportive of him.  His adult daughter is planning to give two weeks notice at work so that she can care for her father.   Length of Stay: 11 days  Current Medications: Scheduled Meds:  . sodium chloride   Intravenous Once  . sodium chloride   Intravenous Once  . sodium chloride   Intravenous Once  . sodium chloride   Intravenous Once  . sodium chloride   Intravenous Once  .  sodium chloride   Intravenous Once  . sodium chloride   Intravenous Once  . sodium chloride   Intravenous Once  . sodium chloride   Intravenous Once  . amiodarone  200 mg Oral Daily  . azelastine  2 spray Each Nare BID  . azithromycin  500 mg Oral Daily  . cefTRIAXone (ROCEPHIN)  IV  2 g Intravenous Q24H  . feeding supplement (PRO-STAT SUGAR FREE 64)  30 mL Oral BID  . folic acid  1 mg Oral Daily  . furosemide  80 mg Intravenous Q6H  . insulin aspart  0-9 Units Subcutaneous 6 times per day  . pantoprazole  40 mg Oral Daily  . pneumococcal 23 valent vaccine  0.5 mL Intramuscular Tomorrow-1000  . potassium chloride  40 mEq Oral BID  . thiamine  100 mg Oral Daily    Continuous Infusions:    PRN Meds: acetaminophen, albuterol, HYDROmorphone (DILAUDID) injection, loratadine, ondansetron, sodium chloride flush, sodium chloride flush  Physical Exam        Well developed male.  Lethargic,  Family at bedside.  CV RRR Resp:  No increased WOB LE:  3+ edema bilaterally.       Vital Signs: BP 110/79 mmHg  Pulse 98  Temp(Src) 98.9 F (37.2 C) (Oral)  Resp 19  Ht 6' (1.829 m)  Wt 109.907 kg (242 lb 4.8 oz)  BMI 32.85 kg/m2  SpO2 97% SpO2: SpO2: 97 % O2 Device: O2 Device: Not Delivered O2 Flow Rate: O2 Flow Rate (L/min): 3 L/min  Intake/output summary:  Intake/Output Summary (Last 24 hours) at 11/11/15 1203 Last data filed at 11/11/15 1047  Gross per 24  hour  Intake   1990 ml  Output   2525 ml  Net   -535 ml   LBM: Last BM Date: 11/10/15 Baseline Weight: Weight: 99.1 kg (218 lb 7.6 oz) Most recent weight: Weight: 109.907 kg (242 lb 4.8 oz)       Palliative Assessment/Data:    Flowsheet Rows        Most Recent Value   Intake Tab    Referral Department  Hospitalist   Unit at Time of Referral  Med/Surg Unit   Palliative Care Primary Diagnosis  Other (Comment)   Clinical Assessment    Palliative Performance Scale Score  40%   Psychosocial & Spiritual Assessment    Palliative Care Outcomes    Patient/Family meeting held?  Yes   Who was at the meeting?  patient.  Then his brother by phone.   Palliative Care Outcomes  Provided advance care planning      Patient Active Problem List   Diagnosis Date Noted  . Goals of care, counseling/discussion   . Counseling regarding goals of care   . Decompensated liver disease (HCC)   . Anasarca   . Bleeding   . Palliative care encounter   . Absolute anemia   . Bilateral lower extremity edema   . Acute respiratory failure with hypoxia (HCC)   . AKI (acute kidney injury) (HCC)   . Alcoholic cirrhosis of liver with ascites (HCC)   . Altered mental status   . Elevated INR   . Lactic acidosis   . Subacute liver failure without hepatic coma   . Protein-calorie malnutrition, severe (HCC) 11/02/2015  . Shock (HCC) 10/31/2015  . Anemia 10/31/2015  . Cirrhosis (HCC) 10/31/2015  . Acute respiratory failure (HCC) 10/31/2015  . Pressure ulcer 10/31/2015  . Thrombocytopenia (HCC) 11/28/2014  . Allergic rhinitis 11/14/2014  .  Arthritis 11/14/2014  . Cataracts, bilateral 11/14/2014  . Sleep apnea 11/14/2014  . HTN (hypertension) 11/14/2014  . Neuropathy (HCC) 11/14/2014  . Achilles tendon tear 11/14/2014  . Pedal edema 11/14/2014  . Renal failure 11/12/2013  . Syncope 11/12/2013    Palliative Care Assessment & Plan   Patient Profile: 55 yo male with PMH of Hep C cirrhosis, OSA, HTN,  and anemia. Admitted 4/15 with hypovolemic shock and a lactic acid of 14, and a hgb of 2.7. He was intubated / extubated. Found to be in SVT requiring an amiodarone drip. He has decompensated Hep C cirrhosis with acute renal failure and has developed a large hematoma on his left thigh.   Assessment: [name] is improving.  He is still very weak and appears lethargic (encephalopathic?) this afternoon.  His INR has improved, but has not normalized (2.5).  His creatinine is improved, but not at baseline.  His nutritional status and protein stores are very poor.    There is concern regarding his disposition and what his insurance will cover.  He needs both PT/OT rehab and eventually inpatient alcohol rehab.  His family understands that eventually he will need 24/7 monitoring at home and they are trying to make arrangements to provide that in the next few weeks.  Recommendations/Plan:  DNR / DNI  Encephalopathy - will order lactulose 30 mg bid.  Titrate to 3-5 loose stools per day.  Patient education regarding alcoholic liver disease  Living Will, HCPOA completed.  Family needs to obtain financial POA.  Patient needs GI follow up for cirrhosis, GI bleeding.  I will request a CIR evaluation.  D/C to 24/7 facility for physical rehab (SNF, CIR).  Followed by Alcohol rehab if the patient is willing / committed to stop drinking.  Goals of Care and Additional Recommendations:  Limitations on Scope of Treatment: Full Scope Treatment  Code Status: DNR  Prognosis:   < 12 months.  Much less if he drinks again or is not medically compliant - given his advanced liver disease and acute on chronic kidney disease.  Discharge Planning:  Undetermined.  Patient needs to be in a monitored environment     Care plan was discussed with Case Manager and Social Work  Thank you for allowing the Palliative Medicine Team to assist in the care of this patient.   Time In: 1:30 Time Out: 3:00 Total  Time 90 Prolonged Time Billed yes      Greater than 50%  of this time was spent counseling and coordinating care related to the above assessment and plan.  Algis DownsMarianne York, PA-C Palliative Medicine Pager: [phone]  Please contact Palliative Medicine Team phone at [phone] for questions and concerns.

## 2015-11-11 NOTE — Care Management Note (Signed)
Case Management Note  Patient Details  Name: [name] MRN: [id number] Date of Birth: 09/23/1960  Subjective/Objective:         CM following for progression and d/c planning.             Action/Plan: 11/11/2015 [facility]. Per Thayer Ohmhris, CM with [employer] this pt does not have SNF benefits for room and board at a SNF. She states that his coverage would include HH or Hospice services in the home or hospice services in a SNF, however not the room and board. Marguerita BeardsMaranne York, of Palliative Care notified and has asked this CM to notifiy the pt brother, [name], prior to another [employer] meeting scheduled for 1:30pm today, 11/11/2015 per Algis DownsMarianne York.  11:48 Message left for pt brother requesting that he call this CM or CSW V Crawford prior to the [employer] meeting this afternoon.      Expected Discharge Date:                  Expected Discharge Plan:  Home w Home Health Services  In-House Referral:  Clinical Social Work  Discharge planning Services  CM Consult  Post Acute Care Choice:    Choice offered to:     DME Arranged:    DME Agency:     HH Arranged:    HH Agency:     Status of Service:  In process, will continue to follow  Medicare Important Message Given:    Date Medicare IM Given:    Medicare IM give by:    Date Additional Medicare IM Given:    Additional Medicare Important Message give by:     If discussed at Long Length of Stay Meetings, dates discussed:    Additional Comments:  Starlyn SkeansRoyal, Janell Keeling U, RN 11/11/2015, 11:29 AM

## 2015-11-11 NOTE — Progress Notes (Signed)
PROGRESS NOTE                                                                                                                                                                                                             Patient Demographics:    [name], is a 55 y.o. male, DOB - 08/16/1960, ZOX:[id number]  Admit date - 10/31/2015   Admitting Physician Leslye Peer, MD  Outpatient Primary MD for the patient is [name], [name]  LOS - 11  Outpatient Specialists: None  Chief Complaint  Patient presents with  . Shortness of Breath       Brief Narrative   55 year old male with history of alcoholic cirrhosis, hypertension, OSA (not on CPAP) who was brought to the ED for dyspnea and quickly became obtunded. He was urgently intubated in the ED. Postintubation became hypotensive and was started on norepinephrine. Labs showed severe anemia with hemoglobin of 2.7 and some cytopenia. Was transferred with several units of PRBC and FFP. No source of bleeding noted. He was found to have severely elevated lactic acid of 14 with acute kidney injury and elevated INR of 4.35. He was started on empiric vancomycin and cefepime and then switch to Zosyn. Patient also developed SVT on 4/16 and was started on amiodarone drip. Pressures were discontinued and patient extubated on 4/17. On 4/19 patient had therapeutic paracentesis with 3.3 L yellow fluid removed for increasing ascites and respiratory distress. Transferred to  hospitalist service on 4/21. CT abdomen and pelvis done on 4/21 showed large hematoma in the left upper thigh.     Subjective:   Patient became febrile with temperature of 100.7 Fahrenheit overnight. Denies any cough or shortness of breath. Denies any abdominal pain. Chest x-ray and blood cultures sent.   Assessment  & Plan :   Principal problem Hypovolemic shock  due to severe Blood loss anemia and? DIC.Marland Kitchen  Received several units of PRBC this hospitalization. Now stable, lactic acidosis resolved. Continue Protonix. Hemoglobin of 7 today. Ordered 1 unit PRBC.    Active Problems: Acute hypoxic respiratory failure with shock Secondary to hypovolemic shock, metabolic acidosis and possible pneumonia and pulmonary edema. No signs of respiratory distress and maintaining O2 sat on room air. Significant positive balance with pulmonary edema. On aggressive diuresis with IV Lasix 80 mg every 6 hours. Monitor strict I/O.  Scheduled nebs. - continue to monitor  Decompensated alcohol excess hep C cirrhosis with anasarca and acute encephalopathy Therapeutic paracentesis done on 4/17 with 3.3 L removed and placed on aggressive IV Lasix.  attempted paracentesis on 4/21 without fluid yield. abdominal distension is likely from severe third spacing seen on CT. Encephalopathy resolved. Aggressive diuresis for anasarca.   Severe Anemia and thrombocytopenia ? DIC with cirrhosis. Also found t have large left thigh hematoma on CT which is contribution. monitor with transfuse prn.Hemoccult-positive. elevated LDH and fibrinogen. Hematology consulted , recommended severe anemia and thrombocytopenia is contributed by bone marrow suppression as well. Elevated LDH and haptoglobin difficult to interpret in a cirrhotic patient. Recommend supportive care. Platelets slowly improving.  Appreciate help from GI. Have signed off with plan on outpatient follow-up.  Fever on 4/24 Currently suspecting pna as cause. Continue azithromycin and rocephin - no abdominal pain reported to me by patient.  Sinus tachycardia with SVT versus A. fib Required amiodarone drip in the ICU. Now with better control. Switched to by mouth. K >4 and mag >2.  2-D echo shows EF of 50-50% without wall motion abnormality.  Acute kidney injury Possibly due to hypovolemic shock, now with cirrhosis and pulmonary edema. Getting aggressive diuresis with Lasix.  Renal function slowly improving.  Hypoglycemia Possibly due to shock and poor by mouth intake. Resolved.  Hypokalemia / Hypomagnesemia Replenished. Placed on daily supplement.  ? Colitis on CT abdomen Colonic wall thickening noted. GI recommend this is more edematous change and does not need intervention.   Code Status : DO NOT RESUSCITATE  Family Communication  : Brother at bedside  Disposition Plan  : PT recommend skilled nursing facility. Possibly later this week once diuresed adequately  Barriers For Discharge : Ongoing anasarca,  persistent anemia requiring transfusion  Consults  :   PC CM (signed off ) [facility] (signed off) hematology ( Dr Pamelia Hoit, signed off)  Procedures  :  Intubation 2-D echo CT head, ultrasound abdomen Therapeutic paracentesis CT abdomen  and pelvis, CT pelvis   DVT Prophylaxis  :  SCD's  Lab Results  Component Value Date   PLT 141* 11/11/2015    Antibiotics  :  Completed  Anti-infectives    Start     Dose/Rate Route Frequency Ordered Stop   11/10/15 1000  azithromycin (ZITHROMAX) tablet 500 mg     500 mg Oral Daily 11/10/15 0737     11/09/15 0945  cefTRIAXone (ROCEPHIN) 2 g in dextrose 5 % 50 mL IVPB     2 g 100 mL/hr over 30 Minutes Intravenous Every 24 hours 11/09/15 0837     11/04/15 1400  piperacillin-tazobactam (ZOSYN) IVPB 3.375 g  Status:  Discontinued     3.375 g 12.5 mL/hr over 240 Minutes Intravenous Every 8 hours 11/04/15 1059 11/05/15 1021   11/04/15 1130  metroNIDAZOLE (FLAGYL) IVPB 500 mg  Status:  Discontinued     500 mg 100 mL/hr over 60 Minutes Intravenous Every 8 hours 11/04/15 1032 11/04/15 1059   11/01/15 1600  ceFEPIme (MAXIPIME) 2 g in dextrose 5 % 50 mL IVPB  Status:  Discontinued     2 g 100 mL/hr over 30 Minutes Intravenous Every 24 hours 10/31/15 1715 11/04/15 1059   11/01/15 0400  vancomycin (VANCOCIN) IVPB 750 mg/150 ml premix  Status:  Discontinued     750 mg 150 mL/hr over 60 Minutes Intravenous  Every 12 hours 10/31/15 1715 11/02/15 1206   10/31/15 1600  ceFEPIme (MAXIPIME) 2 g  in dextrose 5 % 50 mL IVPB     2 g 100 mL/hr over 30 Minutes Intravenous  Once 10/31/15 1521 10/31/15 1650   10/31/15 1600  vancomycin (VANCOCIN) 1,750 mg in sodium chloride 0.9 % 500 mL IVPB     1,750 mg 250 mL/hr over 120 Minutes Intravenous NOW 10/31/15 1521 10/31/15 1814        Objective:   Filed Vitals:   11/11/15 0502 11/11/15 1000 11/11/15 1500 11/11/15 1733  BP: 121/77 110/79 115/82 118/52  Pulse: 98 98 98 97  Temp: 98.8 F (37.1 C) 98.9 F (37.2 C) 99.1 F (37.3 C) 99 F (37.2 C)  TempSrc: Oral Oral Oral   Resp: Height:      Weight:      SpO2: 95% 97% 100%     Wt Readings from Last 3 Encounters:  11/10/15 109.907 kg (242 lb 4.8 oz)  11/28/14 89.268 kg (196 lb 12.8 oz)  11/14/14 90.901 kg (200 lb 6.4 oz)     Intake/Output Summary (Last 24 hours) at 11/11/15 1745 Last data filed at 11/11/15 1500  Gross per 24 hour  Intake   1750 ml  Output   1400 ml  Net    350 ml     Physical Exam  Gen:  not in distress, Alert and awake HEENT: Pallor present,  moist mucosa, supple neck,  Chest: Clear breath sounds bilaterally,  No wheezing CVS: N S1&S2, no murmurs, rubs or gallop GI: Distended with ascites, nontender, foley + Musculoskeletal: warm, 2+ pitting edema bilaterally with anasarca, , scrotal edema improving, erythema over left anterior thigh ( from hematoma) CNS: Alert and oriented,    Data Review:    CBC  Recent Labs Lab 11/04/15 1805 11/05/15 0745  11/07/15 0520 11/08/15 0636 11/09/15 0530 11/10/15 0600 11/11/15 0414  WBC 10.1 7.5  < > 9.6 11.7* 12.7* 13.6* 13.5*  HGB 7.2* 6.2*  < > 7.1* 7.1* 7.0* 8.3* 8.6*  HCT 20.2* 18.0*  < > 20.9* 21.2* 20.6* 25.0* 25.4*  PLT 66* 55*  < > 109* 123* 137* 142* 141*  MCV 83.5 82.2  < > 83.6 83.8 85.5 84.2 86.7  MCH 29.8 28.3  < > 28.4 28.1 29.0 27.9 29.4  MCHC 35.6 34.4  < > 34.0 33.5 34.0 33.2 33.9  RDW 16.2*  16.2*  < > 16.2* 16.7* 17.4* 17.2* 18.0*  LYMPHSABS 0.8 0.6*  --   --   --   --   --   --   MONOABS 3.1* 2.2*  --   --   --   --   --   --   EOSABS 0.2 0.3  --   --   --   --   --   --   BASOSABS 0.0 0.0  --   --   --   --   --   --   < > = values in this interval not displayed.  Chemistries   Recent Labs Lab 11/05/15 0552 11/06/15 0515 11/06/15 1452 11/07/15 0520 11/08/15 0636 11/09/15 0530 11/09/15 0930 11/10/15 0600 11/11/15 0414  NA 135 135  --  135 135 134*  --  136 139  K 3.0* 2.9*  --  3.1* 3.0* 3.0*  --  3.0* 3.4*  CL 105 106  --  105 102 101  --  102 104  CO2 18* 20*  --  21* 21* 21*  --  22 25  GLUCOSE 132* 112*  --  131* 93 133*  --  93 101*  BUN 37* 36*  --  34* 33* 31*  --  28* 29*  CREATININE 2.32* 2.13*  --  1.79* 1.60* 1.58*  --  1.57* 1.55*  CALCIUM 7.4* 7.8*  --  7.8* 8.1* 7.9*  --  7.8* 7.8*  MG  --   --   --  1.0* 1.3*  --  1.3* 0.9* 1.3*  AST 144* 109*  --  85*  --   --   --  63*  --   ALT 73* 69*  --  58  --   --   --  41  --   ALKPHOS 59 66  --  66  --   --   --  74  --   BILITOT 6.8* 8.0* 8.8* 10.0*  --   --   --  13.0*  --    ------------------------------------------------------------------------------------------------------------------ No results for input(s): CHOL, HDL, LDLCALC, TRIG, CHOLHDL, LDLDIRECT in the last 72 hours.  No results found for: HGBA1C ------------------------------------------------------------------------------------------------------------------ No results for input(s): TSH, T4TOTAL, T3FREE, THYROIDAB in the last 72 hours.  Invalid input(s): FREET3 ------------------------------------------------------------------------------------------------------------------ No results for input(s): VITAMINB12, FOLATE, FERRITIN, TIBC, IRON, RETICCTPCT in the last 72 hours.  Coagulation profile  Recent Labs Lab 11/05/15 0552  INR 2.63*    No results for input(s): DDIMER in the last 72 hours.  Cardiac Enzymes No results for  input(s): CKMB, TROPONINI, MYOGLOBIN in the last 168 hours.  Invalid input(s): CK ------------------------------------------------------------------------------------------------------------------    Component Value Date/Time   BNP 694.4* 10/31/2015 1911    Inpatient Medications  Scheduled Meds: . sodium chloride   Intravenous Once  . sodium chloride   Intravenous Once  . sodium chloride   Intravenous Once  . sodium chloride   Intravenous Once  . sodium chloride   Intravenous Once  . sodium chloride   Intravenous Once  . sodium chloride   Intravenous Once  . sodium chloride   Intravenous Once  . sodium chloride   Intravenous Once  . amiodarone  200 mg Oral Daily  . azelastine  2 spray Each Nare BID  . azithromycin  500 mg Oral Daily  . cefTRIAXone (ROCEPHIN)  IV  2 g Intravenous Q24H  . feeding supplement (PRO-STAT SUGAR FREE 64)  30 mL Oral BID  . folic acid  1 mg Oral Daily  . furosemide  80 mg Intravenous Q6H  . insulin aspart  0-9 Units Subcutaneous 6 times per day  . lactulose  30 g Oral BID  . pantoprazole  40 mg Oral Daily  . pneumococcal 23 valent vaccine  0.5 mL Intramuscular Tomorrow-1000  . potassium chloride  40 mEq Oral BID  . thiamine  100 mg Oral Daily   Continuous Infusions:  PRN Meds:.acetaminophen, albuterol, HYDROmorphone (DILAUDID) injection, loratadine, ondansetron, sodium chloride flush, sodium chloride flush  Micro Results Recent Results (from the past 240 hour(s))  Culture, body fluid-bottle     Status: None   Collection Time: 11/04/15  1:04 PM  Result Value Ref Range Status   Specimen Description FLUID PERITONEAL  Final   Special Requests NONE  Final   Culture NO GROWTH 5 DAYS  Final   Report Status 11/09/2015 FINAL  Final  Gram stain     Status: None   Collection Time: 11/04/15  1:04 PM  Result Value Ref Range Status   Specimen Description FLUID PERITONEAL  Final   Special Requests NONE  Final   Gram Stain  Final    FEW WBC PRESENT,BOTH  PMN AND MONONUCLEAR NO ORGANISMS SEEN    Report Status 11/04/2015 FINAL  Final  Culture, Urine     Status: None   Collection Time: 11/09/15  4:40 AM  Result Value Ref Range Status   Specimen Description URINE, RANDOM  Final   Special Requests NONE  Final   Culture NO GROWTH 1 DAY  Final   Report Status 11/10/2015 FINAL  Final  Culture, blood (routine x 2)     Status: None (Preliminary result)   Collection Time: 11/09/15  5:26 AM  Result Value Ref Range Status   Specimen Description BLOOD LEFT ANTECUBITAL  Final   Special Requests BOTTLES DRAWN AEROBIC AND ANAEROBIC 10CC EA  Final   Culture NO GROWTH 2 DAYS  Final   Report Status PENDING  Incomplete  Culture, blood (routine x 2)     Status: None (Preliminary result)   Collection Time: 11/09/15  5:31 AM  Result Value Ref Range Status   Specimen Description BLOOD RIGHT ANTECUBITAL  Final   Special Requests BOTTLES DRAWN AEROBIC AND ANAEROBIC 10CC EA  Final   Culture NO GROWTH 2 DAYS  Final   Report Status PENDING  Incomplete  Culture, body fluid-bottle     Status: None (Preliminary result)   Collection Time: 11/09/15 10:04 AM  Result Value Ref Range Status   Specimen Description FLUID PERITONEAL  Final   Special Requests BOTTLES DRAWN AEROBIC AND ANAEROBIC 5CC  Final   Culture NO GROWTH 2 DAYS  Final   Report Status PENDING  Incomplete  Gram stain     Status: None   Collection Time: 11/09/15 10:04 AM  Result Value Ref Range Status   Specimen Description FLUID PERITONEAL  Final   Special Requests NONE  Final   Gram Stain   Final    FEW WBC PRESENT,BOTH PMN AND MONONUCLEAR NO ORGANISMS SEEN    Report Status 11/09/2015 FINAL  Final    Radiology Reports Ct Abdomen Pelvis Wo Contrast  11/06/2015  CLINICAL DATA:  Edema. Decreasing hemoglobin. Cirrhotic patient with hemoccult positive stool. EXAM: CT ABDOMEN AND PELVIS WITHOUT CONTRAST TECHNIQUE: Multidetector CT imaging of the abdomen and pelvis was performed following the  standard protocol without IV contrast. COMPARISON:  Abdominal ultrasound 10/31/2015 FINDINGS: Lower chest: Right lung base consolidation with air bronchograms. Minimal left basilar opacity. Small subpulmonic left pleural effusion. Mild cardiomegaly. Decreased density of blood pool consistent with anemia. Liver: Nodular contours with cirrhotic morphology. No focal lesion detected on this unenhanced exam. Hepatobiliary: Gallbladder physiologically distended. There is high-density material within the dependent gallbladder and cystic duct. Common bile duct is not well-defined. Pancreas: No ductal dilatation. Spleen: Normal in size, maximal mention 12.8 cm. Adrenal glands: No nodule. Kidneys: No hydronephrosis or urolithiasis. Stomach/Bowel: Ingested contents in the stomach. Small hiatal hernia. Limited bowel assessment given intra-abdominal ascites. No large or small bowel obstruction. There is questionable colonic wall thickening at the splenic flexure of the colon. Formed stool in the ascending colon. Appendix tentatively but not definitively identified. Vascular/Lymphatic: Normal caliber abdominal aorta with trace atherosclerosis. No retroperitoneal adenopathy. Limited assessment for intra-abdominal adenopathy given mesenteric edema. Reproductive: Prostate gland normal in size. Bladder: Decompressed by Foley catheter. Other: Severe whole body wall edema and skin thickening. Moderate intra-abdominal ascites. Diffuse mesenteric edema and small volume mesenteric free fluid. No free air. Musculoskeletal: There are no acute or suspicious osseous abnormalities. Degenerative change involving the left greater than right hip and throughout the spine. IMPRESSION:  1. Probable colonic wall thickening at the splenic flexure of the colon, limited bowel evaluation given lack of enteric contrast and intra-abdominal ascites. This may be reactive or colitis. Ischemic colitis can affect this region, however there is only minimal  atherosclerosis. Infectious or inflammatory etiologies are also considered. 2. Severe body wall edema, skin thickening, and mesenteric edema. Findings consistent with severe third-spacing. 3. Cirrhotic hepatic morphology. Moderate intra-abdominal ascites. No splenomegaly. 4. Right greater than left lung base consolidation with air bronchograms, pneumonia versus atelectasis. Trace left pleural effusion. 5. High-density material within the gallbladder and cystic duct, likely sludge, as no stones were seen on recent abdominal ultrasound. Electronically Signed   By: Rubye Oaks M.D.   On: 11/06/2015 19:06   Ct Head Wo Contrast  10/31/2015  CLINICAL DATA:  Alcohol abuse. Dyspnea requiring intubation. Altered mental status. Hypotensive post intubation requiring norepinephrine. EXAM: CT HEAD WITHOUT CONTRAST TECHNIQUE: Contiguous axial images were obtained from the base of the skull through the vertex without intravenous contrast. COMPARISON:  11/12/2013 head CT. FINDINGS: Two oral route tubes enter the upper trachea and hypopharynx on the lateral scout topogram. Mild fluid is seen layering in the nasopharynx. No evidence of parenchymal hemorrhage or extra-axial fluid collection. No mass lesion, mass effect, or midline shift. No CT evidence of acute infarction. Intracranial atherosclerosis. Nonspecific mild subcortical and periventricular white matter hypodensity, most in keeping with chronic small vessel ischemic change. Cerebral volume is age appropriate. No ventriculomegaly. The visualized paranasal sinuses are essentially clear. The mastoid air cells are unopacified. No evidence of calvarial fracture. IMPRESSION: 1.  No evidence of acute intracranial abnormality. 2. Intracranial atherosclerosis and mild chronic small vessel ischemia. Electronically Signed   By: Delbert Phenix M.D.   On: 10/31/2015 20:33   US Abdomen Complete  10/31/2015  CLINICAL DATA:  Inpatient with cirrhosis, shock and renal failure. EXAM:  ABDOMEN ULTRASOUND COMPLETE COMPARISON:  01/07/2011 CT abdomen/ pelvis. FINDINGS: Gallbladder: No gallstones or wall thickening visualized. No sonographic Murphy sign noted by sonographer. Common bile duct: Diameter: 4 mm Liver: Liver parenchyma is diffusely heterogeneous in echotexture in the liver surface is diffusely irregular in contour, in keeping with cirrhosis. There is reversal of flow in the main portal vein. No liver masses demonstrated. IVC: Not visualized. Pancreas: Not well visualized due to overlying bowel gas and other patient related factors for Spleen: Size and appearance within normal limits. Right Kidney: Length: 10.6 cm. Echogenicity within normal limits. No mass or hydronephrosis visualized. Left Kidney: Length: 11.2 cm. Echogenicity within normal limits. No mass or hydronephrosis visualized. Abdominal aorta: No aneurysm visualized. Other findings: Moderate volume ascites. IMPRESSION: 1. Cirrhosis.  No liver mass detected. 2. Reversed (hepatofugal) flow in the main portal vein. Moderate volume ascites. No splenomegaly . 3. Normal gallbladder with no cholelithiasis. No biliary ductal dilatation. Electronically Signed   By: Delbert Phenix M.D.   On: 10/31/2015 23:58   Ct Femur Left Wo Contrast  11/06/2015  CLINICAL DATA:  Chronic gastrointestinal bleeding. Swelling in the left groin and femur. Question hematoma. Initial encounter. EXAM: CT OF THE LEFT FEMUR WITHOUT CONTRAST TECHNIQUE: Multidetector CT imaging was performed according to the standard protocol. Multiplanar CT image reconstructions were also generated. COMPARISON:  None. FINDINGS: Imaging incidentally includes the right upper leg. Diffuse and intense subcutaneous edema is identified throughout. The short adductor and adductor magnus on the left are markedly swollen and hyperattenuating consistent with the presence of hematoma. Discrete measurement is not possible but the hematoma measures approximately 6.3 cm  AP by 8.1 cm transverse  by 14.2 cm craniocaudal. No other hematoma is identified. No soft tissue gas collection is seen. Partial visualization of degenerative change about the hips. IMPRESSION: Large appearing hematoma in the adductor musculature of the left upper leg cannot be discretely measured. Diffuse and intense subcutaneous edema in the lower pelvis and upper legs bilaterally. No focal bony abnormality. Electronically Signed   By: Drusilla Kanner M.D.   On: 11/06/2015 17:52   US Paracentesis  11/09/2015  INDICATION: Ascites secondary to alcoholic cirrhosis. Request for diagnostic and therapeutic paracentesis. EXAM: ULTRASOUND GUIDED LEFT LOWER QUADRANT PARACENTESIS MEDICATIONS: 1% Lidocaine. COMPLICATIONS: None immediate. PROCEDURE: Informed written consent was obtained from the patient after a discussion of the risks, benefits and alternatives to treatment. A timeout was performed prior to the initiation of the procedure. Initial ultrasound scanning demonstrates a large amount of ascites within the right lower abdominal quadrant. The right lower abdomen was prepped and draped in the usual sterile fashion. 1% lidocaine with epinephrine was used for local anesthesia. Following this, a 19 gauge, 10-cm, Yueh catheter was introduced. An ultrasound image was saved for documentation purposes. The paracentesis was performed. The catheter was removed and a dressing was applied. The patient tolerated the procedure well without immediate post procedural complication. FINDINGS: A total of approximately 1.8 liters of clear yellow fluid was removed. Samples were sent to the laboratory as requested by the clinical team. IMPRESSION: Successful ultrasound-guided paracentesis yielding 1.8 liters of peritoneal fluid. Read by:  Corrin Parker, PA-C Electronically Signed   By: Richarda Overlie M.D.   On: 11/09/2015 11:15   Dg Chest Port 1 View  11/09/2015  CLINICAL DATA:  Fever, shortness of breath, hypertension EXAM: PORTABLE CHEST 1 VIEW COMPARISON:   11/06/2015 FINDINGS: Low lung volumes with bibasilar atelectasis or infiltrates. Heart is mildly enlarged. Mild vascular congestion. No visible effusions. Left central line remains in place, unchanged. IMPRESSION: Cardiomegaly, mild vascular congestion. Bibasilar atelectasis or infiltrates. No real change since prior study. Electronically Signed   By: Charlett Nose M.D.   On: 11/09/2015 07:37   Dg Chest Port 1 View  11/06/2015  CLINICAL DATA:  Pulmonary edema, acute respiratory failure, cirrhosis, renal failure EXAM: PORTABLE CHEST 1 VIEW COMPARISON:  Portable chest x-ray of November 05, 2015 FINDINGS: The lungs are mildly hypoinflated. Parenchymal density medially at the right lung base persists. The cardiac silhouette is top-normal in size. The central pulmonary vascularity is mildly prominent. No pleural effusion or pneumothorax is observed. The left internal jugular venous catheter tip projects over the mid portion of the SVC. IMPRESSION: Mild pulmonary interstitial prominence stable likely reflecting mild interstitial edema. Confluent alveolar opacity at the right lung base medially consistent with atelectasis or pneumonia. No significant pleural effusion and no pneumothorax. Electronically Signed   By: David  [place] M.D.   On: 11/06/2015 07:20   Dg Chest Port 1 View  11/05/2015  CLINICAL DATA:  Dyspnea EXAM: PORTABLE CHEST 1 VIEW COMPARISON:  11/02/2015 FINDINGS: Left internal jugular central line again identified. Tip obscured by overlying EKG leads. Heart size and vascular pattern normal. Low lung volumes. Bilateral lower lobe opacities right worse than left. There has been mild improvement in bilateral lung base aeration. IMPRESSION: Persistent but improved bibasilar opacities. Electronically Signed   By: Esperanza Heir M.D.   On: 11/05/2015 07:11   Dg Chest Port 1 View  11/02/2015  CLINICAL DATA:  55 year old male with respiratory failure. Subsequent encounter. EXAM: PORTABLE CHEST 1 VIEW COMPARISON:  11/01/2015. FINDINGS: Endotracheal tube tip 3.6 cm above the carina. Left central line is in place with the tip angulated possibly entering the azygos vein unchanged from prior exam. Lateral view be necessary for further delineation. No gross pneumothorax. Nasogastric tube courses below the diaphragm. Tip is not included on the present exam. Asymmetric airspace disease greatest lung bases more notable on the right may represent pulmonary edema with basilar atelectasis, pleural fluid or infiltrate. Heart size top-normal. Calcified aorta. IMPRESSION: Similar appearance of asymmetric airspace disease most notable lung bases greater on the right. Left central line tip possibly entering the azygos vein. Please see above. Electronically Signed   By: Lacy DuverneySteven  Olson M.D.   On: 11/02/2015 08:07   Dg Chest Port 1 View  11/01/2015  CLINICAL DATA:  Central line placement. EXAM: PORTABLE CHEST 1 VIEW COMPARISON:  Yesterday. FINDINGS: Endotracheal tube in satisfactory position. Nasogastric tube extending into the stomach. Interval left jugular catheter with its tip directed superiorly. Poor inspiration. Normal sized heart. Mild increase in bibasilar opacity. Unremarkable bones. IMPRESSION: 1. Poor inspiration with increased bibasilar atelectasis. 2. Left jugular catheter tip directed superiorly. This may be in the right innominate vein, azygos vein or proximal superior vena cava. 3. No pneumothorax. Electronically Signed   By: Beckie SaltsSteven  Reid M.D.   On: 11/01/2015 07:13   Dg Chest Portable 1 View  10/31/2015  CLINICAL DATA:  55 year old male status post intubation. EXAM: PORTABLE CHEST 1 VIEW COMPARISON:  Chest x-ray 11/13/2013. FINDINGS: An endotracheal tube is in place with tip 2.8 cm above the carina. A nasogastric tube is seen extending into the stomach, however, the tip of the nasogastric tube extends below the lower margin of the image. Lung volumes are very low. Opacity at the right base presumably reflects atelectasis  in the right middle and lower lobes. Left lung appears relatively clear with minimal subsegmental atelectasis in the left lower lobe. No definite pleural effusions. No evidence of pulmonary edema. Heart size is normal. The patient is rotated to the right on today's exam, resulting in distortion of the mediastinal contours and reduced diagnostic sensitivity and specificity for mediastinal pathology. IMPRESSION: 1. Support apparatus, as above. 2. Low lung volumes with extensive atelectasis in the right middle and lower lobes. Electronically Signed   By: Trudie Reedaniel  Entrikin M.D.   On: 10/31/2015 15:47    Time Spent in minutes  25   Penny PiaVEGA, Aidel Davisson M.D on 11/11/2015 at 5:45 PM  Between 7am to 7pm - Pager - [phone]  After 7pm go to [email or web address] - password [email or web address]  [facility] -  Office  [phone]

## 2015-11-12 ENCOUNTER — Telehealth: Payer: Self-pay | Admitting: Medical

## 2015-11-12 DIAGNOSIS — R5381 Other malaise: Secondary | ICD-10-CM

## 2015-11-12 LAB — CBC
HCT: 26.3 % — ABNORMAL LOW (ref 39.0–52.0)
HEMOGLOBIN: 8.6 g/dL — AB (ref 13.0–17.0)
MCH: 28.9 pg (ref 26.0–34.0)
MCHC: 32.7 g/dL (ref 30.0–36.0)
MCV: 88.3 fL (ref 78.0–100.0)
Platelets: 149 10*3/uL — ABNORMAL LOW (ref 150–400)
RBC: 2.98 MIL/uL — AB (ref 4.22–5.81)
RDW: 18.6 % — ABNORMAL HIGH (ref 11.5–15.5)
WBC: 13.3 10*3/uL — AB (ref 4.0–10.5)

## 2015-11-12 LAB — BASIC METABOLIC PANEL
Anion gap: 9 (ref 5–15)
BUN: 30 mg/dL — ABNORMAL HIGH (ref 6–20)
CHLORIDE: 105 mmol/L (ref 101–111)
CO2: 24 mmol/L (ref 22–32)
Calcium: 7.8 mg/dL — ABNORMAL LOW (ref 8.9–10.3)
Creatinine, Ser: 1.44 mg/dL — ABNORMAL HIGH (ref 0.61–1.24)
GFR calc non Af Amer: 54 mL/min — ABNORMAL LOW (ref >60)
Glucose, Bld: 101 mg/dL — ABNORMAL HIGH (ref 65–99)
Potassium: 3.4 mmol/L — ABNORMAL LOW (ref 3.5–5.1)
SODIUM: 138 mmol/L (ref 135–145)

## 2015-11-12 LAB — HEPATIC FUNCTION PANEL
ALT: 37 U/L (ref 17–63)
AST: 57 U/L — AB (ref 15–41)
Albumin: 2.1 g/dL — ABNORMAL LOW (ref 3.5–5.0)
Alkaline Phosphatase: 82 U/L (ref 38–126)
BILIRUBIN DIRECT: 4.9 mg/dL — AB (ref 0.1–0.5)
BILIRUBIN INDIRECT: 7 mg/dL — AB (ref 0.3–0.9)
BILIRUBIN TOTAL: 11.9 mg/dL — AB (ref 0.3–1.2)
Total Protein: 7.3 g/dL (ref 6.5–8.1)

## 2015-11-12 LAB — AMMONIA: AMMONIA: 57 umol/L — AB (ref 9–35)

## 2015-11-12 LAB — MAGNESIUM
MAGNESIUM: 1 mg/dL — AB (ref 1.7–2.4)
MAGNESIUM: 0.9 mg/dL — AB (ref 1.7–2.4)

## 2015-11-12 LAB — GLUCOSE, CAPILLARY
GLUCOSE-CAPILLARY: 105 mg/dL — AB (ref 65–99)
GLUCOSE-CAPILLARY: 107 mg/dL — AB (ref 65–99)
Glucose-Capillary: 111 mg/dL — ABNORMAL HIGH (ref 65–99)
Glucose-Capillary: 112 mg/dL — ABNORMAL HIGH (ref 65–99)
Glucose-Capillary: 113 mg/dL — ABNORMAL HIGH (ref 65–99)
Glucose-Capillary: 125 mg/dL — ABNORMAL HIGH (ref 65–99)
Glucose-Capillary: 95 mg/dL (ref 65–99)

## 2015-11-12 MED ORDER — FUROSEMIDE 40 MG PO TABS
60.0000 mg | ORAL_TABLET | Freq: Every day | ORAL | Status: DC
  Administered 2015-11-13: 60 mg via ORAL
  Filled 2015-11-12: qty 1

## 2015-11-12 MED ORDER — LACTULOSE 10 GM/15ML PO SOLN
30.0000 g | Freq: Every day | ORAL | Status: DC
  Administered 2015-11-13: 30 g via ORAL
  Filled 2015-11-12: qty 45

## 2015-11-12 MED ORDER — MAGNESIUM SULFATE 2 GM/50ML IV SOLN
2.0000 g | Freq: Once | INTRAVENOUS | Status: AC
  Administered 2015-11-12: 2 g via INTRAVENOUS
  Filled 2015-11-12: qty 50

## 2015-11-12 NOTE — Progress Notes (Signed)
Daily Progress Note   Patient Name: [name]       Date: 11/12/2015 DOB: [birth year]  Age: 55 y.o. MRN#: [id number] Attending Physician: Penny Pia, MD Primary Care Physician: Marisue Brooklyn Admit Date: 10/31/2015    Reason for Consultation/Follow-up: Establishing goals of care  Subjective: I'm feeling better.  The doctor from the physical therapy floor came to see me to ask if I would be interested to going to his floor.  I told him - yes.     Length of Stay: 12 days  Current Medications: Scheduled Meds:  . sodium chloride   Intravenous Once  . sodium chloride   Intravenous Once  . sodium chloride   Intravenous Once  . sodium chloride   Intravenous Once  . sodium chloride   Intravenous Once  . sodium chloride   Intravenous Once  . sodium chloride   Intravenous Once  . sodium chloride   Intravenous Once  . sodium chloride   Intravenous Once  . amiodarone  200 mg Oral Daily  . azelastine  2 spray Each Nare BID  . azithromycin  500 mg Oral Daily  . cefTRIAXone (ROCEPHIN)  IV  2 g Intravenous Q24H  . diphenhydrAMINE  25 mg Oral Once  . feeding supplement (PRO-STAT SUGAR FREE 64)  30 mL Oral BID  . folic acid  1 mg Oral Daily  . furosemide  80 mg Intravenous Q6H  . insulin aspart  0-9 Units Subcutaneous 6 times per day  . [START ON 11/13/2015] lactulose  30 g Oral Daily  . pantoprazole  40 mg Oral Daily  . pneumococcal 23 valent vaccine  0.5 mL Intramuscular Tomorrow-1000  . potassium chloride  40 mEq Oral BID  . thiamine  100 mg Oral Daily    Continuous Infusions:    PRN Meds: acetaminophen, albuterol, HYDROmorphone (DILAUDID) injection, loratadine, ondansetron, sodium chloride flush, sodium chloride flush  Physical Exam        Well developed male.   Lethargic,  Family at bedside.  CV RRR Resp:  No increased WOB LE:  3+ edema bilaterally.       Vital Signs: BP 121/74 mmHg  Pulse 98  Temp(Src) 98.9 F (37.2 C) (Oral)  Resp 18  Ht 6' (1.829 m)  Wt 108.047 kg (238 lb 3.2 oz)  BMI 32.30  kg/m2  SpO2 97% SpO2: SpO2: 97 % O2 Device: O2 Device: Not Delivered O2 Flow Rate: O2 Flow Rate (L/min): 3 L/min  Intake/output summary:   Intake/Output Summary (Last 24 hours) at 11/12/15 1415 Last data filed at 11/12/15 1100  Gross per 24 hour  Intake    480 ml  Output   2401 ml  Net  -1921 ml   LBM: Last BM Date: 11/12/15 Baseline Weight: Weight: 99.1 kg (218 lb 7.6 oz) Most recent weight: Weight: 108.047 kg (238 lb 3.2 oz)       Palliative Assessment/Data:    Flowsheet Rows        Most Recent Value   Intake Tab    Referral Department  Hospitalist   Unit at Time of Referral  Med/Surg Unit   Palliative Care Primary Diagnosis  Other (Comment)   Clinical Assessment    Palliative Performance Scale Score  40%   Psychosocial & Spiritual Assessment    Palliative Care Outcomes    Patient/Family meeting held?  Yes   Who was at the meeting?  patient, 2 daughters, and brother   Palliative Care Outcomes  Provided advance care planning      Patient Active Problem List   Diagnosis Date Noted  . Encephalopathy, hepatic (HCC)   . Goals of care, counseling/discussion   . Counseling regarding goals of care   . Decompensated liver disease (HCC)   . Anasarca   . Bleeding   . Palliative care encounter   . Absolute anemia   . Bilateral lower extremity edema   . Acute respiratory failure with hypoxia (HCC)   . AKI (acute kidney injury) (HCC)   . Alcoholic cirrhosis of liver with ascites (HCC)   . Altered mental status   . Elevated INR   . Lactic acidosis   . Subacute liver failure without hepatic coma   . Protein-calorie malnutrition, severe (HCC) 11/02/2015  . Shock (HCC) 10/31/2015  . Anemia 10/31/2015  . Cirrhosis (HCC)  10/31/2015  . Acute respiratory failure (HCC) 10/31/2015  . Pressure ulcer 10/31/2015  . Thrombocytopenia (HCC) 11/28/2014  . Allergic rhinitis 11/14/2014  . Arthritis 11/14/2014  . Cataracts, bilateral 11/14/2014  . Sleep apnea 11/14/2014  . HTN (hypertension) 11/14/2014  . Neuropathy (HCC) 11/14/2014  . Achilles tendon tear 11/14/2014  . Pedal edema 11/14/2014  . Renal failure 11/12/2013  . Syncope 11/12/2013    Palliative Care Assessment & Plan   Patient Profile: 55 yo male with PMH of Hep C cirrhosis, OSA, HTN, and anemia. Admitted 4/15 with hypovolemic shock and a lactic acid of 14, and a hgb of 2.7. He was intubated / extubated. Found to be in SVT requiring an amiodarone drip. He has decompensated Hep C cirrhosis with acute renal failure and has developed a large hematoma on his left thigh.   Assessment: [name] is improving.  He is more clear mentally this afternoon.  I suspect this is his mental baseline. His total bili appears to have peaked at 13 and is now starting to trend down.  [facility] has evaluated and accepted him pending insurance approval.  Both [name] and his brother want him to go to [facility].  Recommendations/Plan:  DNR / DNI  Encephalopathy - Decreased lactulose today to 30 mg daily.  Titrate to 3-5 loose stools per day.  Patient education regarding alcoholic liver disease  Living Will, HCPOA completed.  Family needs to obtain financial POA.  Patient needs GI follow up outpatient for cirrhosis, GI bleeding.  After  PT rehab patient will need Alcohol rehab if the patient is willing / committed to stop drinking.  Goals of Care and Additional Recommendations:  Limitations on Scope of Treatment: Full Scope Treatment  Code Status: DNR  Prognosis:   < 12 months.  Much less if he drinks again or is not medically compliant - given his advanced liver disease and acute on chronic kidney disease.  Discharge Planning:  Undetermined.  Patient needs to be  in a monitored environment     Care plan was discussed with patient's brother, [name] &Social Work  Thank you for allowing the Palliative Medicine Team to assist in the care of this patient.   Time In: 2:00 Time Out: 2:15 Total Time 15 Prolonged Time Billed no      Greater than 50%  of this time was spent counseling and coordinating care related to the above assessment and plan.  Algis Downs, PA-C Palliative Medicine Pager: [phone]  Please contact Palliative Medicine Team phone at [phone] for questions and concerns.

## 2015-11-12 NOTE — H&P (Signed)
Physical Medicine and Rehabilitation Admission H&P    Chief Complaint  Patient presents with  . Shortness of Breath  : HPI: [name] is a 55 y.o. right handed male with history of alcohol abuse with associated liver disease, hypertension, OSA (untreated). Per chart review patient lives alone but has supportive a supportive brother. Reported to be independent prior to admission. Presented 10/31/2015 with increasing shortness of breath required intubation ED for airway protection became obtunded. Developed hypotension. Stat CBC with hemoglobin 2.7 and no obvious source of bleeding, lactic acid 14, creatinine 2.51, INR 4.35. CT of the head negative for acute changes. Chest x-ray with extensive atelectasis right middle lobe and lower lobes.EKG sinus tachycardia with SVT versus atrial fibrillation placed on amiodarone drip and transition to 200 mg by mouth daily with rate controlled. Echocardiogram with ejection fraction of 55% no wall motion abnormalities. Ultrasound abdomen showed cirrhosis. No liver mass detected. Moderate volume ascites. Follow-up CT abdomen and pelvis showed a large hematoma left upper thigh. Patient was transfused. Maintained on broad-spectrum antibiotics. Hemoglobin stabilized at 7.4 after 6 units packed red blood cells. Underwent paracentesis with 3 L removed 11/02/2015. Gastroenterology follow-up as well as hematology for anemia question DIC related to cirrhosis. No current plan for EGD. Creatinine stabilized with gentle IV fluids 1.44 as well as hemoglobin 8.6. Patient was slowly extubated. Palliative care consult and for goals of care. Physical therapy evaluation has been completed and ongoing.CSW notes patient's insurance does not cover skilled nursing facility. M.D. has requested physical medicine rehabilitation consult.Patient was admitted for comprehensive rehabilitation program  ROS Constitutional: Negative for fever and chills.  HENT: Negative for hearing loss.    Eyes: Negative for blurred vision and double vision.  Respiratory: Positive for cough and shortness of breath.  Cardiovascular: Positive for leg swelling. Negative for chest pain and palpitations.  Gastrointestinal: Positive for constipation. Negative for nausea and vomiting.  Genitourinary: Positive for urgency. Negative for dysuria and hematuria.  Musculoskeletal: Positive for myalgias and joint pain.  Skin: Negative for rash.  Neurological: Positive for weakness and headaches.  Psychiatric/Behavioral: The patient has insomnia.  All other systems reviewed and are negative   Past Medical History  Diagnosis Date  . Hypertension   . Allergy   . Arthritis   . Cataract   . Heart murmur   . Sleep apnea     Problems with sleeping  . Neuromuscular disorder (HCC)     neuropathy rt lateral calf.  . Anemia 10/31/2015   Past Surgical History  Procedure Laterality Date  . Foot surgery Right   . Nasal septum surgery    . Vasectomy     Family History  Problem Relation Age of Onset  . Atrial fibrillation Mother   . CVA Mother   . Hypertension Mother   . Emphysema Mother    Social History:  reports that he has quit smoking. His smoking use included Cigarettes. He has never used smokeless tobacco. He reports that he drinks alcohol. He reports that he does not use illicit drugs. Allergies:  Allergies  Allergen Reactions  . Lactose Intolerance (Gi)     unknown   Medications Prior to Admission  Medication Sig Dispense Refill  . amLODipine (NORVASC) 5 MG tablet Take 5 mg by mouth daily.    . hydrochlorothiazide (HYDRODIURIL) 12.5 MG tablet Take 2 tablets (25 mg total) by mouth daily. 30 tablet 3  . lisinopril (PRINIVIL,ZESTRIL) 10 MG tablet Take 1 tablet (10 mg total) by mouth daily.  30 tablet 1  . meloxicam (MOBIC) 15 MG tablet Take 15 mg by mouth daily.    Marland Kitchen omeprazole (PRILOSEC) 20 MG capsule Take 20 mg by mouth daily.    . potassium chloride SA (K-DUR,KLOR-CON) 20 MEQ tablet  Take 1 tablet (20 mEq total) by mouth once. 30 tablet 0    Home: Home Living Family/patient expects to be discharged to:: Private residence Living Arrangements: Alone Available Help at Discharge: Family, Friend(s), Available PRN/intermittently Type of Home: House Home Access: Stairs to enter Technical brewer of Steps: 1 Home Layout: One level Bathroom Shower/Tub: Tub/shower unit, Charity fundraiser: Standard Bathroom Accessibility: Yes Home Equipment: Cane - single point, Crutches   Functional History: Prior Function Level of Independence: Independent with assistive device(s) Gait / Transfers Assistance Needed: used cane furniture walker ADL's / Homemaking Assistance Needed: independent withADLand IADL tasks; drove  Functional Status:  Mobility: Bed Mobility Overal bed mobility: Needs Assistance Bed Mobility: Supine to Sit, Sit to Supine Rolling: Supervision Supine to sit: Min guard, HOB elevated Sit to supine: Min assist (to lift LLE onto bed) General bed mobility comments: Requires increased time, and assist for trunk and LE's. Transfers Overall transfer level: Needs assistance Equipment used: Rolling walker (2 wheeled) Transfers: Sit to/from Stand Sit to Stand: Min assist, From elevated surface General transfer comment: Verbal cues for hand placement.  Assist to power up to standing and to steady. Ambulation/Gait Ambulation/Gait assistance: Min assist, Mod assist, +2 safety/equipment Ambulation Distance (Feet): 30 Feet Assistive device: Rolling walker (2 wheeled) Gait Pattern/deviations: Step-through pattern, Decreased stride length, Ataxic, Wide base of support General Gait Details: Verbal cues to stay close to RW and stand upright.  Patient keeping RW too far ahead of him.  Ataxic movements of LE's during gait.  Assist to steady and prevent falls.  DOE of 4/4 during gait. Gait velocity: decreased Gait velocity interpretation: Below normal speed for  age/gender    ADL: ADL Overall ADL's : Needs assistance/impaired Eating/Feeding: Set up Grooming: Set up, Sitting Upper Body Bathing: Set up, Sitting Lower Body Bathing: Moderate assistance, Sit to/from stand Upper Body Dressing : Set up, Sitting Lower Body Dressing: Moderate assistance, Sit to/from stand Toilet Transfer: Minimal assistance, RW, Stand-pivot Toileting- Clothing Manipulation and Hygiene: Maximal assistance Functional mobility during ADLs: Minimal assistance, Rolling walker General ADL Comments: Fatigues easily with 2/4 dypsnea with minimal activity. will benefit from AE and energy conservation  Cognition: Cognition Overall Cognitive Status: Within Functional Limits for tasks assessed Orientation Level: Oriented X4 Cognition Arousal/Alertness: Awake/alert Behavior During Therapy: WFL for tasks assessed/performed Overall Cognitive Status: Within Functional Limits for tasks assessed  Physical Exam: Blood pressure 123/86, pulse 92, temperature 98.6 F (37 C), temperature source Oral, resp. rate 18, height 6' (1.829 m), weight 108.047 kg (238 lb 3.2 oz), SpO2 98 %. Physical Exam Constitutional: He is oriented to person, place, and time.  HENT: icteric sclerae  Head: Normocephalic and atraumatic.  Eyes: EOM are normal.  Neck: Normal range of motion. Neck supple. No tracheal deviation present. No thyromegaly present.  Cardiovascular: Normal rate and regular rhythm. no murmurs or rubs Respiratory: Effort normal and breath sounds normal. No respiratory distress.  GI: Soft. Bowel sounds are normal. He exhibits distension. There is no tenderness.  Musculoskeletal:  Bilateral 2+ lower ext edema   Neurological: He is alert and oriented to person, place, and time.  Mood is appropriate. More interactive. Initiated conversation. Fair insight and awareness.  UE bilaterally 5/5 prox to distal. LE: 3/5 HF, 3+  KE and 4-/5 ADF/PF.  Skin: Skin is warm and dry.  Psychiatric: He  is a little flat. His behavior is normal. Thought content normal   Results for orders placed or performed during the hospital encounter of 10/31/15 (from the past 48 hour(s))  Glucose, capillary     Status: Abnormal   Collection Time: 11/11/15  7:29 PM  Result Value Ref Range   Glucose-Capillary 112 (H) 65 - 99 mg/dL  Glucose, capillary     Status: Abnormal   Collection Time: 11/12/15 12:24 AM  Result Value Ref Range   Glucose-Capillary 113 (H) 65 - 99 mg/dL  Glucose, capillary     Status: None   Collection Time: 11/12/15  3:52 AM  Result Value Ref Range   Glucose-Capillary 95 65 - 99 mg/dL  CBC     Status: Abnormal   Collection Time: 11/12/15  5:25 AM  Result Value Ref Range   WBC 13.3 (H) 4.0 - 10.5 K/uL   RBC 2.98 (L) 4.22 - 5.81 MIL/uL   Hemoglobin 8.6 (L) 13.0 - 17.0 g/dL   HCT 26.3 (L) 39.0 - 52.0 %   MCV 88.3 78.0 - 100.0 fL   MCH 28.9 26.0 - 34.0 pg   MCHC 32.7 30.0 - 36.0 g/dL   RDW 18.6 (H) 11.5 - 15.5 %   Platelets 149 (L) 150 - 400 K/uL  Basic metabolic panel     Status: Abnormal   Collection Time: 11/12/15  5:25 AM  Result Value Ref Range   Sodium 138 135 - 145 mmol/L   Potassium 3.4 (L) 3.5 - 5.1 mmol/L   Chloride 105 101 - 111 mmol/L   CO2 24 22 - 32 mmol/L   Glucose, Bld 101 (H) 65 - 99 mg/dL   BUN 30 (H) 6 - 20 mg/dL   Creatinine, Ser 1.44 (H) 0.61 - 1.24 mg/dL   Calcium 7.8 (L) 8.9 - 10.3 mg/dL   GFR calc non Af Amer 54 (L) >60 mL/min   GFR calc Af Amer >60 >60 mL/min    Comment: (NOTE) The eGFR has been calculated using the CKD EPI equation. This calculation has not been validated in all clinical situations. eGFR's persistently <60 mL/min signify possible Chronic Kidney Disease.    Anion gap 9 5 - 15  Magnesium     Status: Abnormal   Collection Time: 11/12/15  5:25 AM  Result Value Ref Range   Magnesium 1.0 (L) 1.7 - 2.4 mg/dL  Ammonia     Status: Abnormal   Collection Time: 11/12/15  5:25 AM  Result Value Ref Range   Ammonia 57 (H) 9 - 35  umol/L  Hepatic function panel     Status: Abnormal   Collection Time: 11/12/15  5:25 AM  Result Value Ref Range   Total Protein 7.3 6.5 - 8.1 g/dL   Albumin 2.1 (L) 3.5 - 5.0 g/dL   AST 57 (H) 15 - 41 U/L   ALT 37 17 - 63 U/L   Alkaline Phosphatase 82 38 - 126 U/L   Total Bilirubin 11.9 (H) 0.3 - 1.2 mg/dL   Bilirubin, Direct 4.9 (H) 0.1 - 0.5 mg/dL   Indirect Bilirubin 7.0 (H) 0.3 - 0.9 mg/dL  Glucose, capillary     Status: Abnormal   Collection Time: 11/12/15  7:44 AM  Result Value Ref Range   Glucose-Capillary 105 (H) 65 - 99 mg/dL  Glucose, capillary     Status: Abnormal   Collection Time: 11/12/15 11:35 AM  Result Value  Ref Range   Glucose-Capillary 111 (H) 65 - 99 mg/dL  Glucose, capillary     Status: Abnormal   Collection Time: 11/12/15  4:25 PM  Result Value Ref Range   Glucose-Capillary 125 (H) 65 - 99 mg/dL  Magnesium     Status: Abnormal   Collection Time: 11/12/15  6:45 PM  Result Value Ref Range   Magnesium 0.9 (LL) 1.7 - 2.4 mg/dL    Comment: CRITICAL RESULT CALLED TO, READ BACK BY AND VERIFIED WITH: B QIU,RN 1944 11/12/15 D BRADLEY   Glucose, capillary     Status: Abnormal   Collection Time: 11/12/15  8:08 PM  Result Value Ref Range   Glucose-Capillary 112 (H) 65 - 99 mg/dL  Glucose, capillary     Status: Abnormal   Collection Time: 11/12/15 11:53 PM  Result Value Ref Range   Glucose-Capillary 107 (H) 65 - 99 mg/dL  Glucose, capillary     Status: None   Collection Time: 11/13/15  3:48 AM  Result Value Ref Range   Glucose-Capillary 91 65 - 99 mg/dL  Basic metabolic panel     Status: Abnormal   Collection Time: 11/13/15  4:30 AM  Result Value Ref Range   Sodium 138 135 - 145 mmol/L   Potassium 3.2 (L) 3.5 - 5.1 mmol/L   Chloride 104 101 - 111 mmol/L   CO2 25 22 - 32 mmol/L   Glucose, Bld 128 (H) 65 - 99 mg/dL   BUN 28 (H) 6 - 20 mg/dL   Creatinine, Ser 1.36 (H) 0.61 - 1.24 mg/dL   Calcium 7.8 (L) 8.9 - 10.3 mg/dL   GFR calc non Af Amer 58 (L) >60  mL/min   GFR calc Af Amer >60 >60 mL/min    Comment: (NOTE) The eGFR has been calculated using the CKD EPI equation. This calculation has not been validated in all clinical situations. eGFR's persistently <60 mL/min signify possible Chronic Kidney Disease.    Anion gap 9 5 - 15  Magnesium     Status: Abnormal   Collection Time: 11/13/15  4:30 AM  Result Value Ref Range   Magnesium 1.0 (L) 1.7 - 2.4 mg/dL  Glucose, capillary     Status: None   Collection Time: 11/13/15  7:48 AM  Result Value Ref Range   Glucose-Capillary 89 65 - 99 mg/dL  Glucose, capillary     Status: Abnormal   Collection Time: 11/13/15 12:06 PM  Result Value Ref Range   Glucose-Capillary 107 (H) 65 - 99 mg/dL   No results found.     Medical Problem List and Plan: 1.  Debilitation secondary to respiratory failure/sepsis, complications from cirrhosis of liver/ascites. Status post paracentesis 11/02/2015  -admit to inpatient rehab today 2.  DVT Prophylaxis/Anticoagulation: SCDs. Monitor for any signs of DVT 3. Pain Management: Tylenol as needed 4. Anemia/thrombocytopenia question DIC related to cirrhosis. Status post transfused multiple units. Follow-up CBC 5. Neuropsych: This patient is capable of making decisions on his own behalf. 6. Skin/Wound Care: Routine skin checks 7. Fluids/Electrolytes/Nutrition: Routine I&O's with follow-up chemistries 8. Hypertension. No current antihypertensive medication. Follow with increased mobility 9. Acute renal insufficiency. Resolving. Follow-up chemistries 10. Sinus tachycardia with SVT versus atrial fibrillation. Amiodarone 200 mg daily.   Post Admission Physician Evaluation: 1. Functional deficits secondary  to debility due to multiple medical issues. 2. Patient is admitted to receive collaborative, interdisciplinary care between the physiatrist, rehab nursing staff, and therapy team. 3. Patient's level of medical complexity and substantial therapy needs  in context  of that medical necessity cannot be provided at a lesser intensity of care such as a SNF. 4. Patient has experienced substantial functional loss from his/her baseline which was documented above under the "Functional History" and "Functional Status" headings.  Judging by the patient's diagnosis, physical exam, and functional history, the patient has potential for functional progress which will result in measurable gains while on inpatient rehab.  These gains will be of substantial and practical use upon discharge  in facilitating mobility and self-care at the household level. 5. Physiatrist will provide 24 hour management of medical needs as well as oversight of the therapy plan/treatment and provide guidance as appropriate regarding the interaction of the two. 6. 24 hour rehab nursing will assist with bladder management, bowel management, safety, skin/wound care, disease management, medication administration, pain management and patient education  and help integrate therapy concepts, techniques,education, etc. 7. PT will assess and treat for/with: Lower extremity strength, range of motion, stamina, balance, functional mobility, safety, adaptive techniques and equipment, NMR, edema mgt, community reintegration.   Goals are: mod I. 8. OT will assess and treat for/with: ADL's, functional mobility, safety, upper extremity strength, adaptive techniques and equipment, community reintegration, ego support.   Goals are: mod I. Therapy may proceed with showering this patient. 9. SLP will assess and treat for/with: n/a.  Goals are: n/a. 10. Case Management and Social Worker will assess and treat for psychological issues and discharge planning. 11. Team conference will be held weekly to assess progress toward goals and to determine barriers to discharge. 12. Patient will receive at least 3 hours of therapy per day at least 5 days per week. 13. ELOS: 11-15  days       14. Prognosis:  good     Meredith Staggers,  MD, [facility] 11/13/2015   11/13/2015

## 2015-11-12 NOTE — Telephone Encounter (Signed)
Next appointment will need at least 30 minutes follow up. Recently hospitalized.

## 2015-11-12 NOTE — Consult Note (Signed)
Physical Medicine and Rehabilitation Consult Reason for Consult: Debilitation/renal failure and septic shock/multi-medical Referring Physician: [facility]   HPI: [name] is a 55 y.o. right handed male with history of alcohol abuse with associated liver disease, hypertension, OSA (untreated). Per chart review patient lives alone but has supportive a supportive brother. Reported to be independent prior to admission. Presented 10/31/2015 with increasing shortness of breath required intubation ED for airway protection became obtunded. Developed hypotension. Stat CBC with hemoglobin 2.7 and no obvious source of bleeding, lactic acid 14, creatinine 2.51, INR 4.35. CT of the head negative for acute changes. Chest x-ray with extensive atelectasis right middle lobe and lower lobes. Ultrasound abdomen showed cirrhosis. No liver mass detected. Moderate volume ascites. Follow-up CT abdomen and pelvis showed a large hematoma left upper thigh. Patient was transfused. Maintained on broad-spectrum antibiotics. Hemoglobin stabilized at 7.4 after 6 units packed red blood cells. Underwent paracentesis with 3 L removed 11/02/2015. Gastroenterology follow-up as well as hematology for anemia question DIC related to cirrhosis. No current plan for EGD. Creatinine stabilized with gentle IV fluids 1.55 as well as hemoglobin 8.6. Patient was slowly extubated. Palliative care consult and for goals of care. Physical therapy evaluation has been completed and ongoing.CSW notes patient's insurance does not cover skilled nursing facility. M.D. has requested physical medicine rehabilitation consult   Review of Systems  Constitutional: Negative for fever and chills.  HENT: Negative for hearing loss.   Eyes: Negative for blurred vision and double vision.  Respiratory: Positive for cough and shortness of breath.   Cardiovascular: Positive for leg swelling. Negative for chest pain and palpitations.  Gastrointestinal: Positive  for constipation. Negative for nausea and vomiting.  Genitourinary: Positive for urgency. Negative for dysuria and hematuria.  Musculoskeletal: Positive for myalgias and joint pain.  Skin: Negative for rash.  Neurological: Positive for weakness and headaches.  Psychiatric/Behavioral: The patient has insomnia.   All other systems reviewed and are negative.  Past Medical History  Diagnosis Date  . Hypertension   . Allergy   . Arthritis   . Cataract   . Heart murmur   . Sleep apnea     Problems with sleeping  . Neuromuscular disorder (HCC)     neuropathy rt lateral calf.  . Anemia 10/31/2015   Past Surgical History  Procedure Laterality Date  . Foot surgery Right   . Nasal septum surgery    . Vasectomy     Family History  Problem Relation Age of Onset  . Atrial fibrillation Mother   . CVA Mother   . Hypertension Mother   . Emphysema Mother    Social History:  reports that he has quit smoking. His smoking use included Cigarettes. He has never used smokeless tobacco. He reports that he drinks alcohol. He reports that he does not use illicit drugs. Allergies:  Allergies  Allergen Reactions  . Lactose Intolerance (Gi)     unknown   Medications Prior to Admission  Medication Sig Dispense Refill  . amLODipine (NORVASC) 5 MG tablet Take 5 mg by mouth daily.    . hydrochlorothiazide (HYDRODIURIL) 12.5 MG tablet Take 2 tablets (25 mg total) by mouth daily. 30 tablet 3  . lisinopril (PRINIVIL,ZESTRIL) 10 MG tablet Take 1 tablet (10 mg total) by mouth daily. 30 tablet 1  . meloxicam (MOBIC) 15 MG tablet Take 15 mg by mouth daily.    Marland Kitchen omeprazole (PRILOSEC) 20 MG capsule Take 20 mg by mouth daily.    Marland Kitchen  potassium chloride SA (K-DUR,KLOR-CON) 20 MEQ tablet Take 1 tablet (20 mEq total) by mouth once. 30 tablet 0    Home: Home Living Family/patient expects to be discharged to:: Private residence Living Arrangements: Alone Available Help at Discharge: Family, Ophelia Shoulder), Available  PRN/intermittently Home Equipment: Dan Humphreys - 2 wheels, [place] - single point, Crutches, Wheelchair - manual  Functional History: Prior Function Level of Independence: Needs assistance Gait / Transfers Assistance Needed: pt indicates he uses his crutches, but also indicates his family/friends have to help him at times.   ADL's / Homemaking Assistance Needed: Unclear how much pt was truly doing for himself. Functional Status:  Mobility: Bed Mobility Overal bed mobility: Needs Assistance Bed Mobility: Supine to Sit Rolling: Mod assist Supine to sit: Min assist Sit to supine: Max assist, +2 for physical assistance General bed mobility comments: Able to "walk" his legs of the EOB in prep for getting up; handheld assist and rials to pull to sit; used bed pad to square off hips at EOB Transfers Overall transfer level: Needs assistance Equipment used: Rolling walker (2 wheeled) Transfers: Sit to/from Stand Sit to Stand: +2 safety/equipment, Mod assist, Min assist General transfer comment: Performed 2 sit<>stand transfers, first from bed with min assist; second from low chair with mod assist; somewhat dependent on momentum Ambulation/Gait Ambulation/Gait assistance: Min assist, +2 safety/equipment Ambulation Distance (Feet): 25 Feet Assistive device: Rolling walker (2 wheeled) Gait Pattern/deviations: Wide base of support, Decreased step length - right, Decreased step length - left, Shuffle General Gait Details: noted scrotal edema leading to a very wide stance and step width; feet did not fit inside RW, so walked with trunk flexed with RW in front of him; cues to self-monitor for activiyt tolerance Gait velocity interpretation: Below normal speed for age/gender    ADL:    Cognition: Cognition Overall Cognitive Status: Within Functional Limits for tasks assessed Orientation Level: Oriented X4 Cognition Arousal/Alertness: Awake/alert Behavior During Therapy: WFL for tasks  assessed/performed Overall Cognitive Status: Within Functional Limits for tasks assessed  Blood pressure 121/90, pulse 93, temperature 99.1 F (37.3 C), temperature source Oral, resp. rate 18, height 6' (1.829 m), weight 108.047 kg (238 lb 3.2 oz), SpO2 99 %. Physical Exam  Vitals reviewed. Constitutional: He is oriented to person, place, and time.  HENT:  Head: Normocephalic and atraumatic.  Eyes: EOM are normal.  Neck: Normal range of motion. Neck supple. No tracheal deviation present. No thyromegaly present.  Cardiovascular: Normal rate and regular rhythm.   Respiratory: Effort normal and breath sounds normal. No respiratory distress.  GI: Soft. Bowel sounds are normal. He exhibits distension. There is no tenderness.  Musculoskeletal:  Bilateral 2+ edema in lower exts  Neurological: He is alert and oriented to person, place, and time.  Mood is flat but appropriate. He makes eye contact with examiner. He would not initiate conversation but appropriate to person, place, age and date of birth. Followed simple commands. UE 5/5 prox to distal. LE: 3/5 HF, 3+ KE and 4-/5 ADF/PF.   Skin: Skin is warm and dry.  Psychiatric: He has a normal mood and affect. His behavior is normal. Thought content normal.    Results for orders placed or performed during the hospital encounter of 10/31/15 (from the past 24 hour(s))  Glucose, capillary     Status: Abnormal   Collection Time: 11/11/15  7:29 AM  Result Value Ref Range   Glucose-Capillary 100 (H) 65 - 99 mg/dL  Glucose, capillary     Status: Abnormal  Collection Time: 11/11/15 11:29 AM  Result Value Ref Range   Glucose-Capillary 134 (H) 65 - 99 mg/dL  Glucose, capillary     Status: None   Collection Time: 11/11/15  4:40 PM  Result Value Ref Range   Glucose-Capillary 97 65 - 99 mg/dL  Glucose, capillary     Status: Abnormal   Collection Time: 11/11/15  7:29 PM  Result Value Ref Range   Glucose-Capillary 112 (H) 65 - 99 mg/dL  Glucose,  capillary     Status: Abnormal   Collection Time: 11/12/15 12:24 AM  Result Value Ref Range   Glucose-Capillary 113 (H) 65 - 99 mg/dL  Glucose, capillary     Status: None   Collection Time: 11/12/15  3:52 AM  Result Value Ref Range   Glucose-Capillary 95 65 - 99 mg/dL   No results found.  Assessment/Plan: Diagnosis: debility related to respiratory/sepsis, complications from cirrhosis of liver 1. Does the need for close, 24 hr/day medical supervision in concert with the patient's rehab needs make it unreasonable for this patient to be served in a less intensive setting? Yes 2. Co-Morbidities requiring supervision/potential complications: anemia, lower ext edema 3. Due to bladder management, bowel management, safety, skin/wound care, disease management, medication administration, pain management and patient education, does the patient require 24 hr/day rehab nursing? Yes 4. Does the patient require coordinated care of a physician, rehab nurse, PT (1-2 hrs/day, 5 days/week) and OT (1-2 hrs/day, 5 days/week) to address physical and functional deficits in the context of the above medical diagnosis(es)? Yes Addressing deficits in the following areas: balance, endurance, locomotion, strength, transferring, bowel/bladder control, bathing, dressing, feeding, grooming, toileting and psychosocial support 5. Can the patient actively participate in an intensive therapy program of at least 3 hrs of therapy per day at least 5 days per week? Yes 6. The potential for patient to make measurable gains while on inpatient rehab is excellent 7. Anticipated functional outcomes upon discharge from inpatient rehab are modified independent  with PT, modified independent with OT, n/a with SLP. 8. Estimated rehab length of stay to reach the above functional goals is: 9-13 days 9. Does the patient have adequate social supports and living environment to accommodate these discharge functional goals? Yes 10. Anticipated D/C  setting: Home 11. Anticipated post D/C treatments: HH therapy and Outpatient therapy 12. Overall Rehab/Functional Prognosis: excellent  RECOMMENDATIONS: This patient's condition is appropriate for continued rehabilitative care in the following setting: CIR Patient has agreed to participate in recommended program. Yes Note that insurance prior authorization may be required for reimbursement for recommended care.  Comment: Rehab Admissions Coordinator to follow up.  Thanks,  Ranelle OysterZachary T. Levy Wellman, MD, [place] DomFAAPMR     11/12/2015

## 2015-11-12 NOTE — Progress Notes (Signed)
Inpatient Rehabilitation   Met with patient to discuss team's recommendation for IP Rehab.  Patient eager to get to rehab and work with therapies in order to get back home.  I have initiated insurance authorization.  Plan to follow along for timing of medical readiness, insurance authorization and bed availability.  Please call with questions.    Carmelia Roller., CCC/SLP Admission Coordinator  [place]  Cell [phone]

## 2015-11-12 NOTE — Progress Notes (Signed)
PROGRESS NOTE                                                                                                                                                                                                             Patient Demographics:    [name], is a 55 y.o. male, DOB - Jan 10, 1961, NFA:[id number]  Admit date - 10/31/2015   Admitting Physician Leslye Peer, MD  Outpatient Primary MD for the patient is [name], [name]  LOS - 12  Outpatient Specialists: None  Chief Complaint  Patient presents with  . Shortness of Breath       Brief Narrative   55 year old male with history of alcoholic cirrhosis, hypertension, OSA (not on CPAP) who was brought to the ED for dyspnea and quickly became obtunded. He was urgently intubated in the ED. Postintubation became hypotensive and was started on norepinephrine. Labs showed severe anemia with hemoglobin of 2.7 and some cytopenia. Was transferred with several units of PRBC and FFP. No source of bleeding noted. He was found to have severely elevated lactic acid of 14 with acute kidney injury and elevated INR of 4.35. He was started on empiric vancomycin and cefepime and then switch to Zosyn. Patient also developed SVT on 4/16 and was started on amiodarone drip. Pressures were discontinued and patient extubated on 4/17. On 4/19 patient had therapeutic paracentesis with 3.3 L yellow fluid removed for increasing ascites and respiratory distress. Transferred to  hospitalist service on 4/21. CT abdomen and pelvis done on 4/21 showed large hematoma in the left upper thigh.     Subjective:   Patient became febrile with temperature of 100.7 Fahrenheit overnight. Denies any cough or shortness of breath. Denies any abdominal pain. Chest x-ray and blood cultures sent.   Assessment  & Plan :   Principal problem Hypovolemic shock  due to severe Blood loss anemia and? DIC.Marland Kitchen  Received several units of PRBC this hospitalization. Now stable, lactic acidosis resolved. Continue Protonix. Hemoglobin of 8.6 s/p transfusion of 1 unit.   Active Problems: Acute hypoxic respiratory failure with shock Secondary to hypovolemic shock, metabolic acidosis and possible pneumonia and pulmonary edema. No signs of respiratory distress and maintaining O2 sat on room air. Significant positive balance with pulmonary edema. Was on aggressive diuresis with IV Lasix 80 mg every 6 hours, will transition to  lasix 60 mg po daily.  - continue to monitor  Decompensated alcohol excess hep C cirrhosis with anasarca and acute encephalopathy Therapeutic paracentesis done on 4/17 with 3.3 L removed and placed on aggressive IV Lasix.  attempted paracentesis on 4/21 without fluid yield. abdominal distension is likely from severe third spacing seen on CT. Encephalopathy resolved. Aggressive diuresis for anasarca.   Severe Anemia and thrombocytopenia ? DIC with cirrhosis. Also found t have large left thigh hematoma on CT which is contribution. monitor with transfuse prn.Hemoccult-positive. elevated LDH and fibrinogen. Hematology consulted , recommended severe anemia and thrombocytopenia is contributed by bone marrow suppression as well. Elevated LDH and haptoglobin difficult to interpret in a cirrhotic patient. Recommend supportive care. Platelets slowly improving.  Appreciate help from GI. Have signed off with plan on outpatient follow-up.  Fever on 4/24 Currently suspecting pna as cause. Continue azithromycin and rocephin - no abdominal pain reported to me by patient.  Sinus tachycardia with SVT versus A. fib Required amiodarone drip in the ICU. Now with better control. Switched to by mouth. K >4 and mag >2.  2-D echo shows EF of 50-50% without wall motion abnormality.  Acute kidney injury Possibly due to hypovolemic shock, now with cirrhosis and pulmonary edema. Getting aggressive diuresis with  Lasix. Renal function slowly improving.  Hypoglycemia Possibly due to shock and poor by mouth intake. Resolved.  Hypokalemia / Hypomagnesemia Replenishing. Magnesium replaced but not reassessed after replenishing. As such will reassess magnesium levels.  ? Colitis on CT abdomen Colonic wall thickening noted. GI recommend this is more edematous change and does not need intervention.   Code Status : DO NOT RESUSCITATE  Family Communication  : Brother at bedside  Disposition Plan  : PT recommend skilled nursing facility. Possibly later this week once diuresed adequately  Barriers For Discharge : Ongoing anasarca,  persistent anemia requiring transfusion  Consults  :   PC CM (signed off ) [facility] (signed off) hematology ( Dr Pamelia Hoit, signed off)  Procedures  :  Intubation 2-D echo CT head, ultrasound abdomen Therapeutic paracentesis CT abdomen  and pelvis, CT pelvis   DVT Prophylaxis  :  SCD's  Lab Results  Component Value Date   PLT 149* 11/12/2015    Antibiotics  :  Completed  Anti-infectives    Start     Dose/Rate Route Frequency Ordered Stop   11/10/15 1000  azithromycin (ZITHROMAX) tablet 500 mg     500 mg Oral Daily 11/10/15 0737     11/09/15 0945  cefTRIAXone (ROCEPHIN) 2 g in dextrose 5 % 50 mL IVPB     2 g 100 mL/hr over 30 Minutes Intravenous Every 24 hours 11/09/15 0837     11/04/15 1400  piperacillin-tazobactam (ZOSYN) IVPB 3.375 g  Status:  Discontinued     3.375 g 12.5 mL/hr over 240 Minutes Intravenous Every 8 hours 11/04/15 1059 11/05/15 1021   11/04/15 1130  metroNIDAZOLE (FLAGYL) IVPB 500 mg  Status:  Discontinued     500 mg 100 mL/hr over 60 Minutes Intravenous Every 8 hours 11/04/15 1032 11/04/15 1059   11/01/15 1600  ceFEPIme (MAXIPIME) 2 g in dextrose 5 % 50 mL IVPB  Status:  Discontinued     2 g 100 mL/hr over 30 Minutes Intravenous Every 24 hours 10/31/15 1715 11/04/15 1059   11/01/15 0400  vancomycin (VANCOCIN) IVPB 750 mg/150 ml premix   Status:  Discontinued     750 mg 150 mL/hr over 60 Minutes Intravenous Every 12 hours  10/31/15 1715 11/02/15 1206   10/31/15 1600  ceFEPIme (MAXIPIME) 2 g in dextrose 5 % 50 mL IVPB     2 g 100 mL/hr over 30 Minutes Intravenous  Once 10/31/15 1521 10/31/15 1650   10/31/15 1600  vancomycin (VANCOCIN) 1,750 mg in sodium chloride 0.9 % 500 mL IVPB     1,750 mg 250 mL/hr over 120 Minutes Intravenous NOW 10/31/15 1521 10/31/15 1814        Objective:   Filed Vitals:   11/12/15 0033 11/12/15 0445 11/12/15 0805 11/12/15 1459  BP:  121/90 121/74 118/80  Pulse:  93 98 96  Temp:  99.1 F (37.3 C) 98.9 F (37.2 C) 99.3 F (37.4 C)  TempSrc:  Oral Oral Oral  Resp:  18 18 18   Height:      Weight: 108.047 kg (238 lb 3.2 oz)     SpO2:  99% 97% 99%    Wt Readings from Last 3 Encounters:  11/12/15 108.047 kg (238 lb 3.2 oz)  11/28/14 89.268 kg (196 lb 12.8 oz)  11/14/14 90.901 kg (200 lb 6.4 oz)     Intake/Output Summary (Last 24 hours) at 11/12/15 1639 Last data filed at 11/12/15 1500  Gross per 24 hour  Intake    480 ml  Output   2901 ml  Net  -2421 ml     Physical Exam  Gen:  not in distress, Alert and awake HEENT: Pallor present,  moist mucosa, supple neck,  Chest: Clear breath sounds bilaterally,  No wheezing CVS: N S1&S2, no murmurs, rubs or gallop GI: Distended with ascites, nontender, foley + Musculoskeletal: warm, 2+ pitting edema bilaterally with anasarca, , scrotal edema improving, erythema over left anterior thigh ( from hematoma) CNS: Alert and oriented,    Data Review:    CBC  Recent Labs Lab 11/08/15 0636 11/09/15 0530 11/10/15 0600 11/11/15 0414 11/12/15 0525  WBC 11.7* 12.7* 13.6* 13.5* 13.3*  HGB 7.1* 7.0* 8.3* 8.6* 8.6*  HCT 21.2* 20.6* 25.0* 25.4* 26.3*  PLT 123* 137* 142* 141* 149*  MCV 83.8 85.5 84.2 86.7 88.3  MCH 28.1 29.0 27.9 29.4 28.9  MCHC 33.5 34.0 33.2 33.9 32.7  RDW 16.7* 17.4* 17.2* 18.0* 18.6*    Chemistries   Recent  Labs Lab 11/06/15 0515 11/06/15 1452  11/07/15 0520 11/08/15 0636 11/09/15 0530 11/09/15 0930 11/10/15 0600 11/11/15 0414 11/12/15 0525  NA 135  --   --  135 135 134*  --  136 139 138  K 2.9*  --   --  3.1* 3.0* 3.0*  --  3.0* 3.4* 3.4*  CL 106  --   --  105 102 101  --  102 104 105  CO2 20*  --   --  21* 21* 21*  --  22 25 24   GLUCOSE 112*  --   --  131* 93 133*  --  93 101* 101*  BUN 36*  --   --  34* 33* 31*  --  28* 29* 30*  CREATININE 2.13*  --   --  1.79* 1.60* 1.58*  --  1.57* 1.55* 1.44*  CALCIUM 7.8*  --   --  7.8* 8.1* 7.9*  --  7.8* 7.8* 7.8*  MG  --   --   < > 1.0* 1.3*  --  1.3* 0.9* 1.3* 1.0*  AST 109*  --   --  85*  --   --   --  63*  --  57*  ALT 69*  --   --  58  --   --   --  41  --  37  ALKPHOS 66  --   --  66  --   --   --  74  --  82  BILITOT 8.0* 8.8*  --  10.0*  --   --   --  13.0*  --  11.9*  < > = values in this interval not displayed. ------------------------------------------------------------------------------------------------------------------ No results for input(s): CHOL, HDL, LDLCALC, TRIG, CHOLHDL, LDLDIRECT in the last 72 hours.  No results found for: HGBA1C ------------------------------------------------------------------------------------------------------------------ No results for input(s): TSH, T4TOTAL, T3FREE, THYROIDAB in the last 72 hours.  Invalid input(s): FREET3 ------------------------------------------------------------------------------------------------------------------ No results for input(s): VITAMINB12, FOLATE, FERRITIN, TIBC, IRON, RETICCTPCT in the last 72 hours.  Coagulation profile No results for input(s): INR, PROTIME in the last 168 hours.  No results for input(s): DDIMER in the last 72 hours.  Cardiac Enzymes No results for input(s): CKMB, TROPONINI, MYOGLOBIN in the last 168 hours.  Invalid input(s):  CK ------------------------------------------------------------------------------------------------------------------    Component Value Date/Time   BNP 694.4* 10/31/2015 1911    Inpatient Medications  Scheduled Meds: . sodium chloride   Intravenous Once  . sodium chloride   Intravenous Once  . sodium chloride   Intravenous Once  . sodium chloride   Intravenous Once  . sodium chloride   Intravenous Once  . sodium chloride   Intravenous Once  . sodium chloride   Intravenous Once  . sodium chloride   Intravenous Once  . sodium chloride   Intravenous Once  . amiodarone  200 mg Oral Daily  . azelastine  2 spray Each Nare BID  . azithromycin  500 mg Oral Daily  . cefTRIAXone (ROCEPHIN)  IV  2 g Intravenous Q24H  . diphenhydrAMINE  25 mg Oral Once  . feeding supplement (PRO-STAT SUGAR FREE 64)  30 mL Oral BID  . folic acid  1 mg Oral Daily  . furosemide  80 mg Intravenous Q6H  . insulin aspart  0-9 Units Subcutaneous 6 times per day  . [START ON 11/13/2015] lactulose  30 g Oral Daily  . pantoprazole  40 mg Oral Daily  . pneumococcal 23 valent vaccine  0.5 mL Intramuscular Tomorrow-1000  . potassium chloride  40 mEq Oral BID  . thiamine  100 mg Oral Daily   Continuous Infusions:  PRN Meds:.acetaminophen, albuterol, HYDROmorphone (DILAUDID) injection, loratadine, ondansetron, sodium chloride flush, sodium chloride flush  Micro Results Recent Results (from the past 240 hour(s))  Culture, body fluid-bottle     Status: None   Collection Time: 11/04/15  1:04 PM  Result Value Ref Range Status   Specimen Description FLUID PERITONEAL  Final   Special Requests NONE  Final   Culture NO GROWTH 5 DAYS  Final   Report Status 11/09/2015 FINAL  Final  Gram stain     Status: None   Collection Time: 11/04/15  1:04 PM  Result Value Ref Range Status   Specimen Description FLUID PERITONEAL  Final   Special Requests NONE  Final   Gram Stain   Final    FEW WBC PRESENT,BOTH PMN AND  MONONUCLEAR NO ORGANISMS SEEN    Report Status 11/04/2015 FINAL  Final  Culture, Urine     Status: None   Collection Time: 11/09/15  4:40 AM  Result Value Ref Range Status   Specimen Description URINE, RANDOM  Final   Special Requests NONE  Final   Culture NO GROWTH 1 DAY  Final   Report Status  11/10/2015 FINAL  Final  Culture, blood (routine x 2)     Status: None (Preliminary result)   Collection Time: 11/09/15  5:26 AM  Result Value Ref Range Status   Specimen Description BLOOD LEFT ANTECUBITAL  Final   Special Requests BOTTLES DRAWN AEROBIC AND ANAEROBIC 10CC EA  Final   Culture NO GROWTH 3 DAYS  Final   Report Status PENDING  Incomplete  Culture, blood (routine x 2)     Status: None (Preliminary result)   Collection Time: 11/09/15  5:31 AM  Result Value Ref Range Status   Specimen Description BLOOD RIGHT ANTECUBITAL  Final   Special Requests BOTTLES DRAWN AEROBIC AND ANAEROBIC 10CC EA  Final   Culture NO GROWTH 3 DAYS  Final   Report Status PENDING  Incomplete  Culture, body fluid-bottle     Status: None (Preliminary result)   Collection Time: 11/09/15 10:04 AM  Result Value Ref Range Status   Specimen Description FLUID PERITONEAL  Final   Special Requests BOTTLES DRAWN AEROBIC AND ANAEROBIC 5CC  Final   Culture NO GROWTH 3 DAYS  Final   Report Status PENDING  Incomplete  Gram stain     Status: None   Collection Time: 11/09/15 10:04 AM  Result Value Ref Range Status   Specimen Description FLUID PERITONEAL  Final   Special Requests NONE  Final   Gram Stain   Final    FEW WBC PRESENT,BOTH PMN AND MONONUCLEAR NO ORGANISMS SEEN    Report Status 11/09/2015 FINAL  Final    Radiology Reports Ct Abdomen Pelvis Wo Contrast  11/06/2015  CLINICAL DATA:  Edema. Decreasing hemoglobin. Cirrhotic patient with hemoccult positive stool. EXAM: CT ABDOMEN AND PELVIS WITHOUT CONTRAST TECHNIQUE: Multidetector CT imaging of the abdomen and pelvis was performed following the standard  protocol without IV contrast. COMPARISON:  Abdominal ultrasound 10/31/2015 FINDINGS: Lower chest: Right lung base consolidation with air bronchograms. Minimal left basilar opacity. Small subpulmonic left pleural effusion. Mild cardiomegaly. Decreased density of blood pool consistent with anemia. Liver: Nodular contours with cirrhotic morphology. No focal lesion detected on this unenhanced exam. Hepatobiliary: Gallbladder physiologically distended. There is high-density material within the dependent gallbladder and cystic duct. Common bile duct is not well-defined. Pancreas: No ductal dilatation. Spleen: Normal in size, maximal mention 12.8 cm. Adrenal glands: No nodule. Kidneys: No hydronephrosis or urolithiasis. Stomach/Bowel: Ingested contents in the stomach. Small hiatal hernia. Limited bowel assessment given intra-abdominal ascites. No large or small bowel obstruction. There is questionable colonic wall thickening at the splenic flexure of the colon. Formed stool in the ascending colon. Appendix tentatively but not definitively identified. Vascular/Lymphatic: Normal caliber abdominal aorta with trace atherosclerosis. No retroperitoneal adenopathy. Limited assessment for intra-abdominal adenopathy given mesenteric edema. Reproductive: Prostate gland normal in size. Bladder: Decompressed by Foley catheter. Other: Severe whole body wall edema and skin thickening. Moderate intra-abdominal ascites. Diffuse mesenteric edema and small volume mesenteric free fluid. No free air. Musculoskeletal: There are no acute or suspicious osseous abnormalities. Degenerative change involving the left greater than right hip and throughout the spine. IMPRESSION: 1. Probable colonic wall thickening at the splenic flexure of the colon, limited bowel evaluation given lack of enteric contrast and intra-abdominal ascites. This may be reactive or colitis. Ischemic colitis can affect this region, however there is only minimal  atherosclerosis. Infectious or inflammatory etiologies are also considered. 2. Severe body wall edema, skin thickening, and mesenteric edema. Findings consistent with severe third-spacing. 3. Cirrhotic hepatic morphology. Moderate intra-abdominal ascites. No splenomegaly. 4.  Right greater than left lung base consolidation with air bronchograms, pneumonia versus atelectasis. Trace left pleural effusion. 5. High-density material within the gallbladder and cystic duct, likely sludge, as no stones were seen on recent abdominal ultrasound. Electronically Signed   By: Rubye OaksMelanie  Ehinger M.D.   On: 11/06/2015 19:06   Ct Head Wo Contrast  10/31/2015  CLINICAL DATA:  Alcohol abuse. Dyspnea requiring intubation. Altered mental status. Hypotensive post intubation requiring norepinephrine. EXAM: CT HEAD WITHOUT CONTRAST TECHNIQUE: Contiguous axial images were obtained from the base of the skull through the vertex without intravenous contrast. COMPARISON:  11/12/2013 head CT. FINDINGS: Two oral route tubes enter the upper trachea and hypopharynx on the lateral scout topogram. Mild fluid is seen layering in the nasopharynx. No evidence of parenchymal hemorrhage or extra-axial fluid collection. No mass lesion, mass effect, or midline shift. No CT evidence of acute infarction. Intracranial atherosclerosis. Nonspecific mild subcortical and periventricular white matter hypodensity, most in keeping with chronic small vessel ischemic change. Cerebral volume is age appropriate. No ventriculomegaly. The visualized paranasal sinuses are essentially clear. The mastoid air cells are unopacified. No evidence of calvarial fracture. IMPRESSION: 1.  No evidence of acute intracranial abnormality. 2. Intracranial atherosclerosis and mild chronic small vessel ischemia. Electronically Signed   By: Delbert PhenixJason A Poff M.D.   On: 10/31/2015 20:33   [place] Abdomen Complete  10/31/2015  CLINICAL DATA:  Inpatient with cirrhosis, shock and renal failure. EXAM:  ABDOMEN ULTRASOUND COMPLETE COMPARISON:  01/07/2011 CT abdomen/ pelvis. FINDINGS: Gallbladder: No gallstones or wall thickening visualized. No sonographic Murphy sign noted by sonographer. Common bile duct: Diameter: 4 mm Liver: Liver parenchyma is diffusely heterogeneous in echotexture in the liver surface is diffusely irregular in contour, in keeping with cirrhosis. There is reversal of flow in the main portal vein. No liver masses demonstrated. IVC: Not visualized. Pancreas: Not well visualized due to overlying bowel gas and other patient related factors for Spleen: Size and appearance within normal limits. Right Kidney: Length: 10.6 cm. Echogenicity within normal limits. No mass or hydronephrosis visualized. Left Kidney: Length: 11.2 cm. Echogenicity within normal limits. No mass or hydronephrosis visualized. Abdominal aorta: No aneurysm visualized. Other findings: Moderate volume ascites. IMPRESSION: 1. Cirrhosis.  No liver mass detected. 2. Reversed (hepatofugal) flow in the main portal vein. Moderate volume ascites. No splenomegaly . 3. Normal gallbladder with no cholelithiasis. No biliary ductal dilatation. Electronically Signed   By: Delbert PhenixJason A Poff M.D.   On: 10/31/2015 23:58   Ct Femur Left Wo Contrast  11/06/2015  CLINICAL DATA:  Chronic gastrointestinal bleeding. Swelling in the left groin and femur. Question hematoma. Initial encounter. EXAM: CT OF THE LEFT FEMUR WITHOUT CONTRAST TECHNIQUE: Multidetector CT imaging was performed according to the standard protocol. Multiplanar CT image reconstructions were also generated. COMPARISON:  None. FINDINGS: Imaging incidentally includes the right upper leg. Diffuse and intense subcutaneous edema is identified throughout. The short adductor and adductor magnus on the left are markedly swollen and hyperattenuating consistent with the presence of hematoma. Discrete measurement is not possible but the hematoma measures approximately 6.3 cm AP by 8.1 cm transverse  by 14.2 cm craniocaudal. No other hematoma is identified. No soft tissue gas collection is seen. Partial visualization of degenerative change about the hips. IMPRESSION: Large appearing hematoma in the adductor musculature of the left upper leg cannot be discretely measured. Diffuse and intense subcutaneous edema in the lower pelvis and upper legs bilaterally. No focal bony abnormality. Electronically Signed   By: Drusilla Kannerhomas  Dalessio M.D.  On: 11/06/2015 17:52   US Paracentesis  11/09/2015  INDICATION: Ascites secondary to alcoholic cirrhosis. Request for diagnostic and therapeutic paracentesis. EXAM: ULTRASOUND GUIDED LEFT LOWER QUADRANT PARACENTESIS MEDICATIONS: 1% Lidocaine. COMPLICATIONS: None immediate. PROCEDURE: Informed written consent was obtained from the patient after a discussion of the risks, benefits and alternatives to treatment. A timeout was performed prior to the initiation of the procedure. Initial ultrasound scanning demonstrates a large amount of ascites within the right lower abdominal quadrant. The right lower abdomen was prepped and draped in the usual sterile fashion. 1% lidocaine with epinephrine was used for local anesthesia. Following this, a 19 gauge, 10-cm, Yueh catheter was introduced. An ultrasound image was saved for documentation purposes. The paracentesis was performed. The catheter was removed and a dressing was applied. The patient tolerated the procedure well without immediate post procedural complication. FINDINGS: A total of approximately 1.8 liters of clear yellow fluid was removed. Samples were sent to the laboratory as requested by the clinical team. IMPRESSION: Successful ultrasound-guided paracentesis yielding 1.8 liters of peritoneal fluid. Read by:  Corrin Parker, PA-C Electronically Signed   By: Richarda Overlie M.D.   On: 11/09/2015 11:15   Dg Chest Port 1 View  11/09/2015  CLINICAL DATA:  Fever, shortness of breath, hypertension EXAM: PORTABLE CHEST 1 VIEW COMPARISON:   11/06/2015 FINDINGS: Low lung volumes with bibasilar atelectasis or infiltrates. Heart is mildly enlarged. Mild vascular congestion. No visible effusions. Left central line remains in place, unchanged. IMPRESSION: Cardiomegaly, mild vascular congestion. Bibasilar atelectasis or infiltrates. No real change since prior study. Electronically Signed   By: Charlett Nose M.D.   On: 11/09/2015 07:37   Dg Chest Port 1 View  11/06/2015  CLINICAL DATA:  Pulmonary edema, acute respiratory failure, cirrhosis, renal failure EXAM: PORTABLE CHEST 1 VIEW COMPARISON:  Portable chest x-ray of November 05, 2015 FINDINGS: The lungs are mildly hypoinflated. Parenchymal density medially at the right lung base persists. The cardiac silhouette is top-normal in size. The central pulmonary vascularity is mildly prominent. No pleural effusion or pneumothorax is observed. The left internal jugular venous catheter tip projects over the mid portion of the SVC. IMPRESSION: Mild pulmonary interstitial prominence stable likely reflecting mild interstitial edema. Confluent alveolar opacity at the right lung base medially consistent with atelectasis or pneumonia. No significant pleural effusion and no pneumothorax. Electronically Signed   By: David  [place] M.D.   On: 11/06/2015 07:20   Dg Chest Port 1 View  11/05/2015  CLINICAL DATA:  Dyspnea EXAM: PORTABLE CHEST 1 VIEW COMPARISON:  11/02/2015 FINDINGS: Left internal jugular central line again identified. Tip obscured by overlying EKG leads. Heart size and vascular pattern normal. Low lung volumes. Bilateral lower lobe opacities right worse than left. There has been mild improvement in bilateral lung base aeration. IMPRESSION: Persistent but improved bibasilar opacities. Electronically Signed   By: Esperanza Heir M.D.   On: 11/05/2015 07:11   Dg Chest Port 1 View  11/02/2015  CLINICAL DATA:  55 year old male with respiratory failure. Subsequent encounter. EXAM: PORTABLE CHEST 1 VIEW COMPARISON:   11/01/2015. FINDINGS: Endotracheal tube tip 3.6 cm above the carina. Left central line is in place with the tip angulated possibly entering the azygos vein unchanged from prior exam. Lateral view be necessary for further delineation. No gross pneumothorax. Nasogastric tube courses below the diaphragm. Tip is not included on the present exam. Asymmetric airspace disease greatest lung bases more notable on the right may represent pulmonary edema with basilar atelectasis, pleural fluid or infiltrate.  Heart size top-normal. Calcified aorta. IMPRESSION: Similar appearance of asymmetric airspace disease most notable lung bases greater on the right. Left central line tip possibly entering the azygos vein. Please see above. Electronically Signed   By: Lacy Duverney M.D.   On: 11/02/2015 08:07   Dg Chest Port 1 View  11/01/2015  CLINICAL DATA:  Central line placement. EXAM: PORTABLE CHEST 1 VIEW COMPARISON:  Yesterday. FINDINGS: Endotracheal tube in satisfactory position. Nasogastric tube extending into the stomach. Interval left jugular catheter with its tip directed superiorly. Poor inspiration. Normal sized heart. Mild increase in bibasilar opacity. Unremarkable bones. IMPRESSION: 1. Poor inspiration with increased bibasilar atelectasis. 2. Left jugular catheter tip directed superiorly. This may be in the right innominate vein, azygos vein or proximal superior vena cava. 3. No pneumothorax. Electronically Signed   By: Beckie Salts M.D.   On: 11/01/2015 07:13   Dg Chest Portable 1 View  10/31/2015  CLINICAL DATA:  55 year old male status post intubation. EXAM: PORTABLE CHEST 1 VIEW COMPARISON:  Chest x-ray 11/13/2013. FINDINGS: An endotracheal tube is in place with tip 2.8 cm above the carina. A nasogastric tube is seen extending into the stomach, however, the tip of the nasogastric tube extends below the lower margin of the image. Lung volumes are very low. Opacity at the right base presumably reflects atelectasis  in the right middle and lower lobes. Left lung appears relatively clear with minimal subsegmental atelectasis in the left lower lobe. No definite pleural effusions. No evidence of pulmonary edema. Heart size is normal. The patient is rotated to the right on today's exam, resulting in distortion of the mediastinal contours and reduced diagnostic sensitivity and specificity for mediastinal pathology. IMPRESSION: 1. Support apparatus, as above. 2. Low lung volumes with extensive atelectasis in the right middle and lower lobes. Electronically Signed   By: Trudie Reed M.D.   On: 10/31/2015 15:47    Time Spent in minutes  25   Penny Pia M.D on 11/12/2015 at 4:39 PM  Between 7am to 7pm - Pager - [phone]  After 7pm go to [email or web address] - password [address] [facility]  [facility] -  Office  [phone]

## 2015-11-12 NOTE — Telephone Encounter (Signed)
Please have pt come in within a month for a hospital follow up. The pt will need to be 30 minutes.

## 2015-11-12 NOTE — Progress Notes (Signed)
CRITICAL VALUE ALERT  Critical value received:  Magnesium 0.9  Date of notification:  11/12/2015  Time of notification:  1943  Critical value read back:Yes.    Nurse who received alert:  Rozann Leschesebecca Adel Neyer  MD notified (1st page):  Claiborne Billingsallahan, NP  Time of first page:  2005  Responding MD:  Claiborne Billingsallahan, NP  Time MD responded:  2006

## 2015-11-13 ENCOUNTER — Encounter (HOSPITAL_COMMUNITY): Payer: Self-pay | Admitting: *Deleted

## 2015-11-13 ENCOUNTER — Inpatient Hospital Stay (HOSPITAL_COMMUNITY)
Admission: RE | Admit: 2015-11-13 | Discharge: 2015-11-30 | DRG: 948 | Disposition: A | Discharge status: Hospice | Payer: Federal, State, Local not specified - PPO | Source: Intra-hospital | Attending: Physical Medicine & Rehabilitation | Admitting: Physical Medicine & Rehabilitation

## 2015-11-13 DIAGNOSIS — I10 Essential (primary) hypertension: Secondary | ICD-10-CM

## 2015-11-13 DIAGNOSIS — F4321 Adjustment disorder with depressed mood: Secondary | ICD-10-CM | POA: Diagnosis not present

## 2015-11-13 DIAGNOSIS — R34 Anuria and oliguria: Secondary | ICD-10-CM

## 2015-11-13 DIAGNOSIS — G4700 Insomnia, unspecified: Secondary | ICD-10-CM | POA: Diagnosis not present

## 2015-11-13 DIAGNOSIS — K7290 Hepatic failure, unspecified without coma: Secondary | ICD-10-CM

## 2015-11-13 DIAGNOSIS — K766 Portal hypertension: Secondary | ICD-10-CM | POA: Insufficient documentation

## 2015-11-13 DIAGNOSIS — R600 Localized edema: Secondary | ICD-10-CM | POA: Diagnosis present

## 2015-11-13 DIAGNOSIS — Z791 Long term (current) use of non-steroidal anti-inflammatories (NSAID): Secondary | ICD-10-CM

## 2015-11-13 DIAGNOSIS — R197 Diarrhea, unspecified: Secondary | ICD-10-CM | POA: Diagnosis not present

## 2015-11-13 DIAGNOSIS — G4733 Obstructive sleep apnea (adult) (pediatric): Secondary | ICD-10-CM | POA: Diagnosis not present

## 2015-11-13 DIAGNOSIS — Z87891 Personal history of nicotine dependence: Secondary | ICD-10-CM | POA: Diagnosis not present

## 2015-11-13 DIAGNOSIS — N5089 Other specified disorders of the male genital organs: Secondary | ICD-10-CM | POA: Diagnosis not present

## 2015-11-13 DIAGNOSIS — M791 Myalgia: Secondary | ICD-10-CM | POA: Diagnosis not present

## 2015-11-13 DIAGNOSIS — Z66 Do not resuscitate: Secondary | ICD-10-CM | POA: Diagnosis not present

## 2015-11-13 DIAGNOSIS — K7031 Alcoholic cirrhosis of liver with ascites: Secondary | ICD-10-CM | POA: Diagnosis not present

## 2015-11-13 DIAGNOSIS — R17 Unspecified jaundice: Secondary | ICD-10-CM | POA: Insufficient documentation

## 2015-11-13 DIAGNOSIS — Z515 Encounter for palliative care: Secondary | ICD-10-CM | POA: Diagnosis not present

## 2015-11-13 DIAGNOSIS — N289 Disorder of kidney and ureter, unspecified: Secondary | ICD-10-CM | POA: Diagnosis not present

## 2015-11-13 DIAGNOSIS — R51 Headache: Secondary | ICD-10-CM

## 2015-11-13 DIAGNOSIS — E46 Unspecified protein-calorie malnutrition: Secondary | ICD-10-CM | POA: Diagnosis not present

## 2015-11-13 DIAGNOSIS — Z6827 Body mass index (BMI) 27.0-27.9, adult: Secondary | ICD-10-CM | POA: Diagnosis not present

## 2015-11-13 DIAGNOSIS — Z79899 Other long term (current) drug therapy: Secondary | ICD-10-CM | POA: Diagnosis not present

## 2015-11-13 DIAGNOSIS — K5900 Constipation, unspecified: Secondary | ICD-10-CM

## 2015-11-13 DIAGNOSIS — R195 Other fecal abnormalities: Secondary | ICD-10-CM | POA: Insufficient documentation

## 2015-11-13 DIAGNOSIS — D684 Acquired coagulation factor deficiency: Secondary | ICD-10-CM

## 2015-11-13 DIAGNOSIS — K760 Fatty (change of) liver, not elsewhere classified: Secondary | ICD-10-CM

## 2015-11-13 DIAGNOSIS — N179 Acute kidney failure, unspecified: Secondary | ICD-10-CM | POA: Diagnosis not present

## 2015-11-13 DIAGNOSIS — E871 Hypo-osmolality and hyponatremia: Secondary | ICD-10-CM | POA: Diagnosis not present

## 2015-11-13 DIAGNOSIS — Z91011 Allergy to milk products: Secondary | ICD-10-CM

## 2015-11-13 DIAGNOSIS — D500 Iron deficiency anemia secondary to blood loss (chronic): Secondary | ICD-10-CM

## 2015-11-13 DIAGNOSIS — D582 Other hemoglobinopathies: Secondary | ICD-10-CM

## 2015-11-13 DIAGNOSIS — I4891 Unspecified atrial fibrillation: Secondary | ICD-10-CM

## 2015-11-13 DIAGNOSIS — R188 Other ascites: Secondary | ICD-10-CM

## 2015-11-13 DIAGNOSIS — I471 Supraventricular tachycardia: Secondary | ICD-10-CM | POA: Diagnosis not present

## 2015-11-13 DIAGNOSIS — R1084 Generalized abdominal pain: Secondary | ICD-10-CM | POA: Insufficient documentation

## 2015-11-13 DIAGNOSIS — D696 Thrombocytopenia, unspecified: Secondary | ICD-10-CM

## 2015-11-13 DIAGNOSIS — R5381 Other malaise: Secondary | ICD-10-CM | POA: Diagnosis present

## 2015-11-13 LAB — TYPE AND SCREEN
ABO/RH(D): O POS
ANTIBODY SCREEN: NEGATIVE
UNIT DIVISION: 0
Unit division: 0
Unit division: 0

## 2015-11-13 LAB — BASIC METABOLIC PANEL
ANION GAP: 9 (ref 5–15)
BUN: 28 mg/dL — AB (ref 6–20)
CHLORIDE: 104 mmol/L (ref 101–111)
CO2: 25 mmol/L (ref 22–32)
Calcium: 7.8 mg/dL — ABNORMAL LOW (ref 8.9–10.3)
Creatinine, Ser: 1.36 mg/dL — ABNORMAL HIGH (ref 0.61–1.24)
GFR calc Af Amer: >60 mL/min (ref >60)
GFR, EST NON AFRICAN AMERICAN: 58 mL/min — AB (ref >60)
GLUCOSE: 128 mg/dL — AB (ref 65–99)
POTASSIUM: 3.2 mmol/L — AB (ref 3.5–5.1)
Sodium: 138 mmol/L (ref 135–145)

## 2015-11-13 LAB — GLUCOSE, CAPILLARY
GLUCOSE-CAPILLARY: 91 mg/dL (ref 65–99)
GLUCOSE-CAPILLARY: 89 mg/dL (ref 65–99)
GLUCOSE-CAPILLARY: 102 mg/dL — AB (ref 65–99)
Glucose-Capillary: 107 mg/dL — ABNORMAL HIGH (ref 65–99)
Glucose-Capillary: 106 mg/dL — ABNORMAL HIGH (ref 65–99)

## 2015-11-13 LAB — MAGNESIUM: Magnesium: 1 mg/dL — ABNORMAL LOW (ref 1.7–2.4)

## 2015-11-13 MED ORDER — AMIODARONE HCL 200 MG PO TABS
200.0000 mg | ORAL_TABLET | Freq: Every day | ORAL | Status: DC
  Administered 2015-11-14 – 2015-11-30 (×18): 200 mg via ORAL
  Filled 2015-11-13 (×17): qty 1

## 2015-11-13 MED ORDER — VITAMIN B-1 100 MG PO TABS
100.0000 mg | ORAL_TABLET | Freq: Every day | ORAL | Status: DC
  Administered 2015-11-14 – 2015-11-30 (×17): 100 mg via ORAL
  Filled 2015-11-13 (×17): qty 1

## 2015-11-13 MED ORDER — INSULIN ASPART 100 UNIT/ML ~~LOC~~ SOLN: 0.0000 [IU] | SUBCUTANEOUS | Status: DC

## 2015-11-13 MED ORDER — PANTOPRAZOLE SODIUM 40 MG PO TBEC
40.0000 mg | DELAYED_RELEASE_TABLET | Freq: Every day | ORAL | Status: DC
  Administered 2015-11-14 – 2015-11-30 (×17): 40 mg via ORAL
  Filled 2015-11-13 (×17): qty 1

## 2015-11-13 MED ORDER — SORBITOL 70 % SOLN: 30.0000 mL | Freq: Every day | Status: DC | PRN

## 2015-11-13 MED ORDER — LORATADINE 10 MG PO TABS
10.0000 mg | ORAL_TABLET | Freq: Every day | ORAL | Status: DC | PRN
  Administered 2015-11-17 – 2015-11-19 (×2): 10 mg via ORAL
  Filled 2015-11-13 (×2): qty 1

## 2015-11-13 MED ORDER — INSULIN ASPART 100 UNIT/ML ~~LOC~~ SOLN
0.0000 [IU] | Freq: Three times a day (TID) | SUBCUTANEOUS | Status: DC
  Administered 2015-11-20 – 2015-11-27 (×4): 1 [IU] via SUBCUTANEOUS

## 2015-11-13 MED ORDER — LACTULOSE 10 GM/15ML PO SOLN: 30.0000 g | Freq: Every day | ORAL | Status: DC

## 2015-11-13 MED ORDER — POTASSIUM CHLORIDE CRYS ER 20 MEQ PO TBCR
40.0000 meq | EXTENDED_RELEASE_TABLET | Freq: Two times a day (BID) | ORAL | Status: DC
  Administered 2015-11-13 – 2015-11-20 (×14): 40 meq via ORAL
  Filled 2015-11-13 (×14): qty 2

## 2015-11-13 MED ORDER — FUROSEMIDE 20 MG PO TABS: 60.0000 mg | ORAL_TABLET | Freq: Every day | ORAL | Status: DC

## 2015-11-13 MED ORDER — ALBUTEROL SULFATE (2.5 MG/3ML) 0.083% IN NEBU: 2.5000 mg | INHALATION_SOLUTION | RESPIRATORY_TRACT | Status: DC | PRN

## 2015-11-13 MED ORDER — THIAMINE HCL 100 MG PO TABS: 100.0000 mg | ORAL_TABLET | Freq: Every day | ORAL | Status: DC

## 2015-11-13 MED ORDER — AZELASTINE HCL 0.1 % NA SOLN
2.0000 | Freq: Two times a day (BID) | NASAL | Status: DC
  Filled 2015-11-13: qty 30

## 2015-11-13 MED ORDER — DEXTROSE 5 % IV SOLN
2.0000 g | INTRAVENOUS | Status: DC
  Administered 2015-11-14 – 2015-11-17 (×4): 2 g via INTRAVENOUS
  Filled 2015-11-13 (×4): qty 2

## 2015-11-13 MED ORDER — FOLIC ACID 1 MG PO TABS
1.0000 mg | ORAL_TABLET | Freq: Every day | ORAL | Status: DC
  Administered 2015-11-14 – 2015-11-30 (×17): 1 mg via ORAL
  Filled 2015-11-13 (×17): qty 1

## 2015-11-13 MED ORDER — ACETAMINOPHEN 325 MG PO TABS
650.0000 mg | ORAL_TABLET | Freq: Four times a day (QID) | ORAL | Status: DC | PRN
  Administered 2015-11-15 – 2015-11-25 (×15): 650 mg via ORAL
  Filled 2015-11-13 (×16): qty 2

## 2015-11-13 MED ORDER — FUROSEMIDE 40 MG PO TABS
60.0000 mg | ORAL_TABLET | Freq: Every day | ORAL | Status: DC
  Administered 2015-11-14 – 2015-11-15 (×2): 60 mg via ORAL
  Filled 2015-11-13 (×2): qty 1

## 2015-11-13 MED ORDER — AMIODARONE HCL 200 MG PO TABS: 200.0000 mg | ORAL_TABLET | Freq: Every day | ORAL | Status: DC

## 2015-11-13 MED ORDER — ONDANSETRON HCL 4 MG/2ML IJ SOLN
4.0000 mg | Freq: Four times a day (QID) | INTRAMUSCULAR | Status: DC | PRN
  Administered 2015-11-16 – 2015-11-23 (×4): 4 mg via INTRAVENOUS
  Filled 2015-11-13 (×5): qty 2

## 2015-11-13 MED ORDER — AZITHROMYCIN 500 MG PO TABS: 500.0000 mg | ORAL_TABLET | Freq: Every day | ORAL | Status: DC

## 2015-11-13 MED ORDER — AZITHROMYCIN 500 MG PO TABS
500.0000 mg | ORAL_TABLET | Freq: Every day | ORAL | Status: DC
  Administered 2015-11-14 – 2015-11-17 (×4): 500 mg via ORAL
  Filled 2015-11-13 (×4): qty 1

## 2015-11-13 MED ORDER — ONDANSETRON HCL 4 MG PO TABS
4.0000 mg | ORAL_TABLET | Freq: Four times a day (QID) | ORAL | Status: DC | PRN
  Administered 2015-11-16 – 2015-11-27 (×4): 4 mg via ORAL
  Filled 2015-11-13 (×6): qty 1

## 2015-11-13 MED ORDER — CEFDINIR 300 MG PO CAPS: 300.0000 mg | ORAL_CAPSULE | Freq: Two times a day (BID) | ORAL | Status: DC

## 2015-11-13 MED ORDER — LACTULOSE 10 GM/15ML PO SOLN
30.0000 g | Freq: Every day | ORAL | Status: DC
  Administered 2015-11-14 – 2015-11-19 (×5): 30 g via ORAL
  Filled 2015-11-13 (×5): qty 45

## 2015-11-13 NOTE — H&P (View-Only) (Signed)
Physical Medicine and Rehabilitation Admission H&P    Chief Complaint  Patient presents with  . Shortness of Breath  : HPI: [name] is a 55 y.o. right handed male with history of alcohol abuse with associated liver disease, hypertension, OSA (untreated). Per chart review patient lives alone but has supportive a supportive brother. Reported to be independent prior to admission. Presented 10/31/2015 with increasing shortness of breath required intubation ED for airway protection became obtunded. Developed hypotension. Stat CBC with hemoglobin 2.7 and no obvious source of bleeding, lactic acid 14, creatinine 2.51, INR 4.35. CT of the head negative for acute changes. Chest x-ray with extensive atelectasis right middle lobe and lower lobes.EKG sinus tachycardia with SVT versus atrial fibrillation placed on amiodarone drip and transition to 200 mg by mouth daily with rate controlled. Echocardiogram with ejection fraction of 55% no wall motion abnormalities. Ultrasound abdomen showed cirrhosis. No liver mass detected. Moderate volume ascites. Follow-up CT abdomen and pelvis showed a large hematoma left upper thigh. Patient was transfused. Maintained on broad-spectrum antibiotics. Hemoglobin stabilized at 7.4 after 6 units packed red blood cells. Underwent paracentesis with 3 L removed 11/02/2015. Gastroenterology follow-up as well as hematology for anemia question DIC related to cirrhosis. No current plan for EGD. Creatinine stabilized with gentle IV fluids 1.44 as well as hemoglobin 8.6. Patient was slowly extubated. Palliative care consult and for goals of care. Physical therapy evaluation has been completed and ongoing.CSW notes patient's insurance does not cover skilled nursing facility. M.D. has requested physical medicine rehabilitation consult.Patient was admitted for comprehensive rehabilitation program  ROS Constitutional: Negative for fever and chills.  HENT: Negative for hearing loss.    Eyes: Negative for blurred vision and double vision.  Respiratory: Positive for cough and shortness of breath.  Cardiovascular: Positive for leg swelling. Negative for chest pain and palpitations.  Gastrointestinal: Positive for constipation. Negative for nausea and vomiting.  Genitourinary: Positive for urgency. Negative for dysuria and hematuria.  Musculoskeletal: Positive for myalgias and joint pain.  Skin: Negative for rash.  Neurological: Positive for weakness and headaches.  Psychiatric/Behavioral: The patient has insomnia.  All other systems reviewed and are negative   Past Medical History  Diagnosis Date  . Hypertension   . Allergy   . Arthritis   . Cataract   . Heart murmur   . Sleep apnea     Problems with sleeping  . Neuromuscular disorder (HCC)     neuropathy rt lateral calf.  . Anemia 10/31/2015   Past Surgical History  Procedure Laterality Date  . Foot surgery Right   . Nasal septum surgery    . Vasectomy     Family History  Problem Relation Age of Onset  . Atrial fibrillation Mother   . CVA Mother   . Hypertension Mother   . Emphysema Mother    Social History:  reports that he has quit smoking. His smoking use included Cigarettes. He has never used smokeless tobacco. He reports that he drinks alcohol. He reports that he does not use illicit drugs. Allergies:  Allergies  Allergen Reactions  . Lactose Intolerance (Gi)     unknown   Medications Prior to Admission  Medication Sig Dispense Refill  . amLODipine (NORVASC) 5 MG tablet Take 5 mg by mouth daily.    . hydrochlorothiazide (HYDRODIURIL) 12.5 MG tablet Take 2 tablets (25 mg total) by mouth daily. 30 tablet 3  . lisinopril (PRINIVIL,ZESTRIL) 10 MG tablet Take 1 tablet (10 mg total) by mouth daily.  30 tablet 1  . meloxicam (MOBIC) 15 MG tablet Take 15 mg by mouth daily.    Marland Kitchen omeprazole (PRILOSEC) 20 MG capsule Take 20 mg by mouth daily.    . potassium chloride SA (K-DUR,KLOR-CON) 20 MEQ tablet  Take 1 tablet (20 mEq total) by mouth once. 30 tablet 0    Home: Home Living Family/patient expects to be discharged to:: Private residence Living Arrangements: Alone Available Help at Discharge: Family, Friend(s), Available PRN/intermittently Type of Home: House Home Access: Stairs to enter Technical brewer of Steps: 1 Home Layout: One level Bathroom Shower/Tub: Tub/shower unit, Charity fundraiser: Standard Bathroom Accessibility: Yes Home Equipment: Cane - single point, Crutches   Functional History: Prior Function Level of Independence: Independent with assistive device(s) Gait / Transfers Assistance Needed: used cane furniture walker ADL's / Homemaking Assistance Needed: independent withADLand IADL tasks; drove  Functional Status:  Mobility: Bed Mobility Overal bed mobility: Needs Assistance Bed Mobility: Supine to Sit, Sit to Supine Rolling: Supervision Supine to sit: Min guard, HOB elevated Sit to supine: Min assist (to lift LLE onto bed) General bed mobility comments: Requires increased time, and assist for trunk and LE's. Transfers Overall transfer level: Needs assistance Equipment used: Rolling walker (2 wheeled) Transfers: Sit to/from Stand Sit to Stand: Min assist, From elevated surface General transfer comment: Verbal cues for hand placement.  Assist to power up to standing and to steady. Ambulation/Gait Ambulation/Gait assistance: Min assist, Mod assist, +2 safety/equipment Ambulation Distance (Feet): 30 Feet Assistive device: Rolling walker (2 wheeled) Gait Pattern/deviations: Step-through pattern, Decreased stride length, Ataxic, Wide base of support General Gait Details: Verbal cues to stay close to RW and stand upright.  Patient keeping RW too far ahead of him.  Ataxic movements of LE's during gait.  Assist to steady and prevent falls.  DOE of 4/4 during gait. Gait velocity: decreased Gait velocity interpretation: Below normal speed for  age/gender    ADL: ADL Overall ADL's : Needs assistance/impaired Eating/Feeding: Set up Grooming: Set up, Sitting Upper Body Bathing: Set up, Sitting Lower Body Bathing: Moderate assistance, Sit to/from stand Upper Body Dressing : Set up, Sitting Lower Body Dressing: Moderate assistance, Sit to/from stand Toilet Transfer: Minimal assistance, RW, Stand-pivot Toileting- Clothing Manipulation and Hygiene: Maximal assistance Functional mobility during ADLs: Minimal assistance, Rolling walker General ADL Comments: Fatigues easily with 2/4 dypsnea with minimal activity. will benefit from AE and energy conservation  Cognition: Cognition Overall Cognitive Status: Within Functional Limits for tasks assessed Orientation Level: Oriented X4 Cognition Arousal/Alertness: Awake/alert Behavior During Therapy: WFL for tasks assessed/performed Overall Cognitive Status: Within Functional Limits for tasks assessed  Physical Exam: Blood pressure 123/86, pulse 92, temperature 98.6 F (37 C), temperature source Oral, resp. rate 18, height 6' (1.829 m), weight 108.047 kg (238 lb 3.2 oz), SpO2 98 %. Physical Exam Constitutional: He is oriented to person, place, and time.  HENT: icteric sclerae  Head: Normocephalic and atraumatic.  Eyes: EOM are normal.  Neck: Normal range of motion. Neck supple. No tracheal deviation present. No thyromegaly present.  Cardiovascular: Normal rate and regular rhythm. no murmurs or rubs Respiratory: Effort normal and breath sounds normal. No respiratory distress.  GI: Soft. Bowel sounds are normal. He exhibits distension. There is no tenderness.  Musculoskeletal:  Bilateral 2+ lower ext edema   Neurological: He is alert and oriented to person, place, and time.  Mood is appropriate. More interactive. Initiated conversation. Fair insight and awareness.  UE bilaterally 5/5 prox to distal. LE: 3/5 HF, 3+  KE and 4-/5 ADF/PF.  Skin: Skin is warm and dry.  Psychiatric: He  is a little flat. His behavior is normal. Thought content normal   Results for orders placed or performed during the hospital encounter of 10/31/15 (from the past 48 hour(s))  Glucose, capillary     Status: Abnormal   Collection Time: 11/11/15  7:29 PM  Result Value Ref Range   Glucose-Capillary 112 (H) 65 - 99 mg/dL  Glucose, capillary     Status: Abnormal   Collection Time: 11/12/15 12:24 AM  Result Value Ref Range   Glucose-Capillary 113 (H) 65 - 99 mg/dL  Glucose, capillary     Status: None   Collection Time: 11/12/15  3:52 AM  Result Value Ref Range   Glucose-Capillary 95 65 - 99 mg/dL  CBC     Status: Abnormal   Collection Time: 11/12/15  5:25 AM  Result Value Ref Range   WBC 13.3 (H) 4.0 - 10.5 K/uL   RBC 2.98 (L) 4.22 - 5.81 MIL/uL   Hemoglobin 8.6 (L) 13.0 - 17.0 g/dL   HCT 26.3 (L) 39.0 - 52.0 %   MCV 88.3 78.0 - 100.0 fL   MCH 28.9 26.0 - 34.0 pg   MCHC 32.7 30.0 - 36.0 g/dL   RDW 18.6 (H) 11.5 - 15.5 %   Platelets 149 (L) 150 - 400 K/uL  Basic metabolic panel     Status: Abnormal   Collection Time: 11/12/15  5:25 AM  Result Value Ref Range   Sodium 138 135 - 145 mmol/L   Potassium 3.4 (L) 3.5 - 5.1 mmol/L   Chloride 105 101 - 111 mmol/L   CO2 24 22 - 32 mmol/L   Glucose, Bld 101 (H) 65 - 99 mg/dL   BUN 30 (H) 6 - 20 mg/dL   Creatinine, Ser 1.44 (H) 0.61 - 1.24 mg/dL   Calcium 7.8 (L) 8.9 - 10.3 mg/dL   GFR calc non Af Amer 54 (L) >60 mL/min   GFR calc Af Amer >60 >60 mL/min    Comment: (NOTE) The eGFR has been calculated using the CKD EPI equation. This calculation has not been validated in all clinical situations. eGFR's persistently <60 mL/min signify possible Chronic Kidney Disease.    Anion gap 9 5 - 15  Magnesium     Status: Abnormal   Collection Time: 11/12/15  5:25 AM  Result Value Ref Range   Magnesium 1.0 (L) 1.7 - 2.4 mg/dL  Ammonia     Status: Abnormal   Collection Time: 11/12/15  5:25 AM  Result Value Ref Range   Ammonia 57 (H) 9 - 35  umol/L  Hepatic function panel     Status: Abnormal   Collection Time: 11/12/15  5:25 AM  Result Value Ref Range   Total Protein 7.3 6.5 - 8.1 g/dL   Albumin 2.1 (L) 3.5 - 5.0 g/dL   AST 57 (H) 15 - 41 U/L   ALT 37 17 - 63 U/L   Alkaline Phosphatase 82 38 - 126 U/L   Total Bilirubin 11.9 (H) 0.3 - 1.2 mg/dL   Bilirubin, Direct 4.9 (H) 0.1 - 0.5 mg/dL   Indirect Bilirubin 7.0 (H) 0.3 - 0.9 mg/dL  Glucose, capillary     Status: Abnormal   Collection Time: 11/12/15  7:44 AM  Result Value Ref Range   Glucose-Capillary 105 (H) 65 - 99 mg/dL  Glucose, capillary     Status: Abnormal   Collection Time: 11/12/15 11:35 AM  Result Value  Ref Range   Glucose-Capillary 111 (H) 65 - 99 mg/dL  Glucose, capillary     Status: Abnormal   Collection Time: 11/12/15  4:25 PM  Result Value Ref Range   Glucose-Capillary 125 (H) 65 - 99 mg/dL  Magnesium     Status: Abnormal   Collection Time: 11/12/15  6:45 PM  Result Value Ref Range   Magnesium 0.9 (LL) 1.7 - 2.4 mg/dL    Comment: CRITICAL RESULT CALLED TO, READ BACK BY AND VERIFIED WITH: B QIU,RN 1944 11/12/15 D BRADLEY   Glucose, capillary     Status: Abnormal   Collection Time: 11/12/15  8:08 PM  Result Value Ref Range   Glucose-Capillary 112 (H) 65 - 99 mg/dL  Glucose, capillary     Status: Abnormal   Collection Time: 11/12/15 11:53 PM  Result Value Ref Range   Glucose-Capillary 107 (H) 65 - 99 mg/dL  Glucose, capillary     Status: None   Collection Time: 11/13/15  3:48 AM  Result Value Ref Range   Glucose-Capillary 91 65 - 99 mg/dL  Basic metabolic panel     Status: Abnormal   Collection Time: 11/13/15  4:30 AM  Result Value Ref Range   Sodium 138 135 - 145 mmol/L   Potassium 3.2 (L) 3.5 - 5.1 mmol/L   Chloride 104 101 - 111 mmol/L   CO2 25 22 - 32 mmol/L   Glucose, Bld 128 (H) 65 - 99 mg/dL   BUN 28 (H) 6 - 20 mg/dL   Creatinine, Ser 1.36 (H) 0.61 - 1.24 mg/dL   Calcium 7.8 (L) 8.9 - 10.3 mg/dL   GFR calc non Af Amer 58 (L) >60  mL/min   GFR calc Af Amer >60 >60 mL/min    Comment: (NOTE) The eGFR has been calculated using the CKD EPI equation. This calculation has not been validated in all clinical situations. eGFR's persistently <60 mL/min signify possible Chronic Kidney Disease.    Anion gap 9 5 - 15  Magnesium     Status: Abnormal   Collection Time: 11/13/15  4:30 AM  Result Value Ref Range   Magnesium 1.0 (L) 1.7 - 2.4 mg/dL  Glucose, capillary     Status: None   Collection Time: 11/13/15  7:48 AM  Result Value Ref Range   Glucose-Capillary 89 65 - 99 mg/dL  Glucose, capillary     Status: Abnormal   Collection Time: 11/13/15 12:06 PM  Result Value Ref Range   Glucose-Capillary 107 (H) 65 - 99 mg/dL   No results found.     Medical Problem List and Plan: 1.  Debilitation secondary to respiratory failure/sepsis, complications from cirrhosis of liver/ascites. Status post paracentesis 11/02/2015  -admit to inpatient rehab today 2.  DVT Prophylaxis/Anticoagulation: SCDs. Monitor for any signs of DVT 3. Pain Management: Tylenol as needed 4. Anemia/thrombocytopenia question DIC related to cirrhosis. Status post transfused multiple units. Follow-up CBC 5. Neuropsych: This patient is capable of making decisions on his own behalf. 6. Skin/Wound Care: Routine skin checks 7. Fluids/Electrolytes/Nutrition: Routine I&O's with follow-up chemistries 8. Hypertension. No current antihypertensive medication. Follow with increased mobility 9. Acute renal insufficiency. Resolving. Follow-up chemistries 10. Sinus tachycardia with SVT versus atrial fibrillation. Amiodarone 200 mg daily.   Post Admission Physician Evaluation: 1. Functional deficits secondary  to debility due to multiple medical issues. 2. Patient is admitted to receive collaborative, interdisciplinary care between the physiatrist, rehab nursing staff, and therapy team. 3. Patient's level of medical complexity and substantial therapy needs  in context  of that medical necessity cannot be provided at a lesser intensity of care such as a SNF. 4. Patient has experienced substantial functional loss from his/her baseline which was documented above under the "Functional History" and "Functional Status" headings.  Judging by the patient's diagnosis, physical exam, and functional history, the patient has potential for functional progress which will result in measurable gains while on inpatient rehab.  These gains will be of substantial and practical use upon discharge  in facilitating mobility and self-care at the household level. 5. Physiatrist will provide 24 hour management of medical needs as well as oversight of the therapy plan/treatment and provide guidance as appropriate regarding the interaction of the two. 6. 24 hour rehab nursing will assist with bladder management, bowel management, safety, skin/wound care, disease management, medication administration, pain management and patient education  and help integrate therapy concepts, techniques,education, etc. 7. PT will assess and treat for/with: Lower extremity strength, range of motion, stamina, balance, functional mobility, safety, adaptive techniques and equipment, NMR, edema mgt, community reintegration.   Goals are: mod I. 8. OT will assess and treat for/with: ADL's, functional mobility, safety, upper extremity strength, adaptive techniques and equipment, community reintegration, ego support.   Goals are: mod I. Therapy may proceed with showering this patient. 9. SLP will assess and treat for/with: n/a.  Goals are: n/a. 10. Case Management and Social Worker will assess and treat for psychological issues and discharge planning. 11. Team conference will be held weekly to assess progress toward goals and to determine barriers to discharge. 12. Patient will receive at least 3 hours of therapy per day at least 5 days per week. 13. ELOS: 11-15  days       14. Prognosis:  good     Meredith Staggers,  MD, [facility] 11/13/2015   11/13/2015

## 2015-11-13 NOTE — Progress Notes (Signed)
Inpatient Rehabilitation  I have received authorization from [employer] [employer] for [facility] Rehab admission today.  Please call with questions.  Charlane FerrettiMelissa Luccia Reinheimer, M.A., CCC/SLP Admission Coordinator  [facility]  Cell [phone]

## 2015-11-13 NOTE — PMR Pre-admission (Signed)
PMR Admission Coordinator Pre-Admission Assessment  Patient: [name] is an 55 y.o., male MRN: [id number] DOB: 1960/12/17 Height: 6' (182.9 cm) Weight: 108.047 kg (238 lb 3.2 oz)              Insurance Information HMO:     PPO: X     PCP:      IPA:      80/20:      OTHER:  PRIMARY: BCBS Federal      Policy#: [id number]      Subscriber: Self CM Name: Cari Caraway      Phone#: [phone]     Fax#: [phone] Pre-Cert#: [id number] authorized by Talbert Forest for 11/13/15-11/23/15 with updates for extension or discharge summary due to [facility] on 11/24/15       Employer: Full time Benefits:  Phone #: [phone]     Name: Richarda Overlie, reference #[id number] Eff. Date: 07/21/07     Deduct: $350.00      Out of Pocket Max: $5000.00      Life Max: N/A CIR: $350 one time co-pay upon admission      SNF: Not covered Outpatient: PT/OT/SLP     Co-Pay: $35, 75 combined visits Home Health: 85%      Co-Pay: 15% DME: 85%     Co-Pay: 15% Providers: in network  Medicaid Application Date:       Case Manager:  Disability Application Date:       Case Worker:   Emergency Conservator, museum/gallery Information    Name Relation Home Work Mobile   Simms Brother [phone]     Rollene Fare [phone]       Current Medical History  Patient Admitting Diagnosis: Debility related to respiratory/sepsis, complications from cirrhosis of liver   History of Present Illness: [name] is a 55 y.o. right handed male with history of alcohol abuse with associated liver disease, hypertension, OSA (untreated). Per chart review patient lives alone but has supportive a supportive brother. Reported to be independent prior to admission. Presented 10/31/2015 with increasing shortness of breath required intubation ED for airway protection became obtunded. Developed hypotension. Stat CBC with hemoglobin 2.7 and no obvious source of bleeding, lactic acid 14, creatinine 2.51, INR 4.35. CT of the head negative  for acute changes. Chest x-ray with extensive atelectasis right middle lobe and lower lobes.EKG sinus tachycardia with SVT versus atrial fibrillation placed on amiodarone drip and transition to 200 mg by mouth daily with rate controlled. Echocardiogram with ejection fraction of 55% no wall motion abnormalities. Ultrasound abdomen showed cirrhosis. No liver mass detected. Moderate volume ascites. Follow-up CT abdomen and pelvis showed a large hematoma left upper thigh. Patient was transfused. Maintained on broad-spectrum antibiotics. Hemoglobin stabilized at 7.4 after 6 units packed red blood cells. Underwent paracentesis with 3 L removed 11/02/2015. Gastroenterology follow-up as well as hematology for anemia question DIC related to cirrhosis. No current plan for EGD. Creatinine stabilized with gentle IV fluids 1.44 as well as hemoglobin 8.6. Patient was slowly extubated. Palliative care consult and for goals of care. Physical therapy evaluation has been completed and ongoing. CSW notes patient's insurance does not cover skilled nursing facility. M.D. has requested physical medicine rehabilitation consult. Patient was admitted for comprehensive rehabilitation program.       Past Medical History  Past Medical History  Diagnosis Date  . Hypertension   . Allergy   . Arthritis   . Cataract   . Heart murmur   . Sleep apnea     Problems with sleeping  .  Neuromuscular disorder (HCC)     neuropathy rt lateral calf.  . Anemia 10/31/2015    Family History  family history includes Atrial fibrillation in his mother; CVA in his mother; Emphysema in his mother; Hypertension in his mother.  Prior Rehab/Hospitalizations:  Has the patient had major surgery during 100 days prior to admission? No  Current Medications   Current facility-administered medications:  .  0.9 %  sodium chloride infusion, , Intravenous, Once, Praveen Mannam, MD, Last Rate: 10 mL/hr at 11/06/15 0800, 10 mL/hr at 11/06/15 0800 .  0.9  %  sodium chloride infusion, , Intravenous, Once, Cyril Mourning V, MD .  0.9 %  sodium chloride infusion, , Intravenous, Once, Cyril Mourning V, MD .  0.9 %  sodium chloride infusion, , Intravenous, Once, Yolanda Manges, DO, Stopped at 11/04/15 [phone] .  0.9 %  sodium chloride infusion, , Intravenous, Once, Erin Fulling, MD, Stopped at 11/04/15 0328 .  0.9 %  sodium chloride infusion, , Intravenous, Once, Nelda Bucks, MD .  0.9 %  sodium chloride infusion, , Intravenous, Once, Nishant Dhungel, MD .  0.9 %  sodium chloride infusion, , Intravenous, Once, Nelda Bucks, MD .  0.9 %  sodium chloride infusion, , Intravenous, Once, Nishant Dhungel, MD .  acetaminophen (TYLENOL) tablet 650 mg, 650 mg, Oral, Q6H PRN, Nishant Dhungel, MD, 650 mg at 11/09/15 1211 .  albuterol (PROVENTIL) (2.5 MG/3ML) 0.083% nebulizer solution 2.5 mg, 2.5 mg, Nebulization, Q4H PRN, Nishant Dhungel, MD, 2.5 mg at 11/07/15 0233 .  amiodarone (PACERONE) tablet 200 mg, 200 mg, Oral, Daily, Nishant Dhungel, MD, 200 mg at 11/13/15 1006 .  azelastine (ASTELIN) 0.1 % nasal spray 2 spray, 2 spray, Each Nare, BID, Nishant Dhungel, MD, 2 spray at 11/06/15 1000 .  azithromycin (ZITHROMAX) tablet 500 mg, 500 mg, Oral, Daily, Nishant Dhungel, MD, 500 mg at 11/13/15 1006 .  cefTRIAXone (ROCEPHIN) 2 g in dextrose 5 % 50 mL IVPB, 2 g, Intravenous, Q24H, Nishant Dhungel, MD, 2 g at 11/13/15 1006 .  diphenhydrAMINE (BENADRYL) capsule 25 mg, 25 mg, Oral, Once, [employer], PA-C, 25 mg at 11/11/15 2235 .  feeding supplement (PRO-STAT SUGAR FREE 64) liquid 30 mL, 30 mL, Oral, BID, Nishant Dhungel, MD, 30 mL at 11/13/15 1006 .  folic acid (FOLVITE) tablet 1 mg, 1 mg, Oral, Daily, Nelda Bucks, MD, 1 mg at 11/13/15 1006 .  furosemide (LASIX) injection 80 mg, 80 mg, Intravenous, Q6H, Nishant Dhungel, MD, 80 mg at 11/13/15 1300 .  furosemide (LASIX) tablet 60 mg, 60 mg, Oral, Daily, Penny Pia, MD, 60 mg at 11/13/15 1006 .   HYDROmorphone (DILAUDID) injection 0.5 mg, 0.5 mg, Intravenous, Q4H PRN, Hillary Bow, DO, 0.5 mg at 11/11/15 1558 .  insulin aspart (novoLOG) injection 0-9 Units, 0-9 Units, Subcutaneous, 6 times per day, Coralyn Helling, MD, 1 Units at 11/12/15 1826 .  lactulose (CHRONULAC) 10 GM/15ML solution 30 g, 30 g, Oral, Daily, Marianne L York, PA-C, 30 g at 11/13/15 1006 .  loratadine (CLARITIN) tablet 10 mg, 10 mg, Oral, Daily PRN, Rolan Lipa, NP, 10 mg at 11/13/15 1006 .  ondansetron (ZOFRAN) injection 4 mg, 4 mg, Intravenous, Q6H PRN, Roma Kayser Schorr, NP, 4 mg at 11/10/15 0740 .  pantoprazole (PROTONIX) EC tablet 40 mg, 40 mg, Oral, Daily, Nelda Bucks, MD, 40 mg at 11/13/15 1006 .  pneumococcal 23 valent vaccine (PNU-IMMUNE) injection 0.5 mL, 0.5 mL, Intramuscular, Tomorrow-1000, Oretha Milch, MD, Stopped  at 11/04/15 1000 .  potassium chloride SA (K-DUR,KLOR-CON) CR tablet 40 mEq, 40 mEq, Oral, BID, Nishant Dhungel, MD, 40 mEq at 11/13/15 1006 .  sodium chloride flush (NS) 0.9 % injection 10-40 mL, 10-40 mL, Intracatheter, PRN, Nishant Dhungel, MD, 20 mL at 11/13/15 0432 .  sodium chloride flush (NS) 0.9 % injection 3 mL, 3 mL, Intravenous, PRN, Leslye Peerobert S Byrum, MD, 10 mL at 11/01/15 2257 .  thiamine (VITAMIN B-1) tablet 100 mg, 100 mg, Oral, Daily, Nelda Bucksaniel J Feinstein, MD, 100 mg at 11/13/15 1006  Patients Current Diet: Diet regular Room service appropriate?: Yes; Fluid consistency:: Thin; Fluid restriction:: 1200 mL Fluid Diet - low sodium heart healthy  Precautions / Restrictions Precautions Precautions: Fall Restrictions Weight Bearing Restrictions: No   Has the patient had 2 or more falls or a fall with injury in the past year?No  Prior Activity Level Limited Community (1-2x/wk): Patient reported that he got out of the house and drove when he felt good, if he did not feel good and needed to go out he would have his companion Lupita LeashDonna drive him if needed after she got off  work.  Home Assistive Devices / Equipment Home Assistive Devices/Equipment: None Home Equipment: Cane - single point, Crutches  Prior Device Use: Indicate devices/aids used by the patient prior to current illness, exacerbation or injury? Cane  Prior Functional Level Prior Function Level of Independence: Independent with assistive device(s) Gait / Transfers Assistance Needed: used cane furniture walker ADL's / Homemaking Assistance Needed: independent withADLand IADL tasks; drove  Self Care: Did the patient need help bathing, dressing, using the toilet or eating?  Independent  Indoor Mobility: Did the patient need assistance with walking from room to room (with or without device)? Independent  Stairs: Did the patient need assistance with internal or external stairs (with or without device)? Independent  Functional Cognition: Did the patient need help planning regular tasks such as shopping or remembering to take medications? Independent  Current Functional Level Cognition  Overall Cognitive Status: Within Functional Limits for tasks assessed Orientation Level: Oriented X4    Extremity Assessment (includes Sensation/Coordination)  Upper Extremity Assessment: Generalized weakness  Lower Extremity Assessment: Defer to PT evaluation RLE Deficits / Details: Generally weak with strength grossly 3/5.  pt very edemnatous.   RLE Sensation: decreased light touch, history of peripheral neuropathy RLE Coordination: decreased fine motor, decreased gross motor LLE Deficits / Details: Generally weak with strength grossly 3/5.  pt very edemnatous.   LLE Sensation: decreased light touch, history of peripheral neuropathy LLE Coordination: decreased fine motor, decreased gross motor    ADLs  Overall ADL's : Needs assistance/impaired Eating/Feeding: Set up Grooming: Set up, Sitting Upper Body Bathing: Set up, Sitting Lower Body Bathing: Moderate assistance, Sit to/from stand Upper Body  Dressing : Set up, Sitting Lower Body Dressing: Moderate assistance, Sit to/from stand Toilet Transfer: Minimal assistance, RW, Stand-pivot Toileting- Clothing Manipulation and Hygiene: Maximal assistance Functional mobility during ADLs: Minimal assistance, Rolling walker General ADL Comments: Fatigues easily with 2/4 dypsnea with minimal activity. will benefit from AE and energy conservation    Mobility  Overal bed mobility: Needs Assistance Bed Mobility: Supine to Sit, Sit to Supine Rolling: Supervision Supine to sit: Min guard, HOB elevated Sit to supine: Min assist (to lift LLE onto bed) General bed mobility comments: Requires increased time, and assist for trunk and LE's.    Transfers  Overall transfer level: Needs assistance Equipment used: Rolling walker (2 wheeled) Transfers: Sit to/from Stand Sit  to Stand: Min assist, From elevated surface General transfer comment: Verbal cues for hand placement.  Assist to power up to standing and to steady.    Ambulation / Gait / Stairs / Wheelchair Mobility  Ambulation/Gait Ambulation/Gait assistance: Min assist, Mod assist, +2 safety/equipment Ambulation Distance (Feet): 30 Feet Assistive device: Rolling walker (2 wheeled) Gait Pattern/deviations: Step-through pattern, Decreased stride length, Ataxic, Wide base of support General Gait Details: Verbal cues to stay close to RW and stand upright.  Patient keeping RW too far ahead of him.  Ataxic movements of LE's during gait.  Assist to steady and prevent falls.  DOE of 4/4 during gait. Gait velocity: decreased Gait velocity interpretation: Below normal speed for age/gender    Posture / Balance Dynamic Sitting Balance Sitting balance - Comments: pt needs Bil UE support and intermittent MinA to prevent fall posteriorly.  pt's HR remained in A-fib while sitting EOB ranging 120s to 160s.   Balance Overall balance assessment: Needs assistance Sitting-balance support: Bilateral upper  extremity supported, Feet supported Sitting balance-Leahy Scale: Good Sitting balance - Comments: pt needs Bil UE support and intermittent MinA to prevent fall posteriorly.  pt's HR remained in A-fib while sitting EOB ranging 120s to 160s.   Postural control: Posterior lean Standing balance-Leahy Scale: Poor Standing balance comment: Requires UE support for balance.  Ataxic.    Special needs/care consideration BiPAP/CPAP: No CPM: No Continuous Drip IV:  Dialysis: No         Life Vest: No Oxygen: No Special Bed: No Trach Size: No Wound Vac (area): No       Skin: right wrist and left elbow                               Bowel mgmt: 2 times on 4/28 per patient report  Bladder mgmt: foley in place Diabetic mgmt: No     Previous Home Environment Living Arrangements: Alone Available Help at Discharge: Family, Friend(s), Available PRN/intermittently Type of Home: House Home Layout: One level Home Access: Stairs to enter Secretary/administrator of Steps: 1 Bathroom Shower/Tub: Hydrographic surveyor, Sport and exercise psychologist: Standard Bathroom Accessibility: Yes How Accessible: Accessible via walker Home Care Services: No  Discharge Living Setting Plans for Discharge Living Setting: Patient's home Type of Home at Discharge: House Discharge Home Access: Stairs to enter Entrance Stairs-Rails: Left Entrance Stairs-Number of Steps: 1 Discharge Bathroom Shower/Tub: Tub/shower unit, Door Discharge Bathroom Toilet: Standard Does the patient have any problems obtaining your medications?: No  Social/Family/Support Systems Patient Roles: Other (Comment) (has a companion, that he does not live with ) Anticipated Caregiver: Jackelyn Poling Anticipated Caregiver's Contact Information: [phone] Ability/Limitations of Caregiver: Lives elsewhere and works Sunday-Thursday until 2:30 Caregiver Availability: Intermittent Discharge Plan Discussed with Primary Caregiver: No Is Caregiver In Agreement with  Plan?:  (Patient stated that he had dicussed it with her) Does Caregiver/Family have Issues with Lodging/Transportation while Pt is in Rehab?: No  Goals/Additional Needs Patient/Family Goal for Rehab: PT/OT Mod I  Expected length of stay: 9-13 days Cultural Considerations: None Dietary Needs: None Equipment Needs: TBD Special Service Needs: None Additional Information: None Pt/Family Agrees to Admission and willing to participate: Yes Program Orientation Provided & Reviewed with Pt/Caregiver Including Roles  & Responsibilities: Yes Additional Information Needs: N/A Information Needs to be Provided By: N/A  Decrease burden of Care through IP rehab admission: No  Possible need for SNF placement upon discharge: No  Patient Condition: This  patient's condition remains as documented in the consult dated 11/12/15, in which the Rehabilitation Physician determined and documented that the patient's condition is appropriate for intensive rehabilitative care in an inpatient rehabilitation facility. Will admit to inpatient rehab today.  Preadmission Screen Completed By:  Fae Pippin, 11/13/2015 3:58 PM ______________________________________________________________________   Discussed status with Dr. Riley Kill on 11/13/15 at 1558 and received telephone approval for admission today.  Admission Coordinator:  Fae Pippin, time 1558 Dorna Bloom 11/13/15

## 2015-11-13 NOTE — Interval H&P Note (Signed)
[name] was admitted today to Inpatient Rehabilitation with the diagnosis of debility.  The patient's history has been reviewed, patient examined, and there is no change in status.  Patient continues to be appropriate for intensive inpatient rehabilitation.  I have reviewed the patient's chart and labs.  Questions were answered to the patient's satisfaction. The PAPE has been reviewed and assessment remains appropriate.  SWARTZ,ZACHARY T 11/13/2015, 8:07 PM

## 2015-11-13 NOTE — Discharge Summary (Signed)
Physician Discharge Summary  [name] ZOX:[id number] DOB: 1961-04-24 DOA: 10/31/2015  PCP: Esperanza Richters, PA-C  Admit date: 10/31/2015 Discharge date: 11/13/2015  Time spent: > 35 minutes  Recommendations for Outpatient Follow-up:  1. WBC levels trending down 2. Pt will receive 3 more days of antibiotic therapy to complete a seven-day treatment course 3. Reassess magnesium within the next 1-2 days and replace if necessary   Discharge Diagnoses:  Active Problems:   Renal failure   Thrombocytopenia (HCC)   Shock (HCC)   Anemia   Cirrhosis (HCC)   Acute respiratory failure (HCC)   Pressure ulcer   Protein-calorie malnutrition, severe (HCC)   Acute respiratory failure with hypoxia (HCC)   AKI (acute kidney injury) (HCC)   Alcoholic cirrhosis of liver with ascites (HCC)   Altered mental status   Elevated INR   Lactic acidosis   Subacute liver failure without hepatic coma   Palliative care encounter   Absolute anemia   Bilateral lower extremity edema   Bleeding   Goals of care, counseling/discussion   Counseling regarding goals of care   Decompensated liver disease (HCC)   Anasarca   Encephalopathy, hepatic (HCC)   Discharge Condition: stable  Diet recommendation: regular diet  Filed Weights   11/09/15 1018 11/10/15 1155 11/12/15 0033  Weight: 112.674 kg (248 lb 6.4 oz) 109.907 kg (242 lb 4.8 oz) 108.047 kg (238 lb 3.2 oz)    History of present illness:  55 year old male with history of alcoholic cirrhosis, hypertension, OSA (not on CPAP) who was brought to the ED for dyspnea and quickly became obtunded. He was urgently intubated in the ED. Postintubation became hypotensive and was started on norepinephrine. Labs showed severe anemia with hemoglobin of 2.7 and some cytopenia. Was transferred with several units of PRBC and FFP. No source of bleeding noted. He was found to have severely elevated lactic acid of 14 with acute kidney injury and elevated INR of 4.35. He  was started on empiric vancomycin and cefepime and then switch to Zosyn. Patient also developed SVT on 4/16 and was started on amiodarone drip. Pressures were discontinued and patient extubated on 4/17. On 4/19 patient had therapeutic paracentesis with 3.3 L yellow fluid removed for increasing ascites and respiratory distress. Transferred to hospitalist service on 4/21. CT abdomen and pelvis done on 4/21 showed large hematoma in the left upper thigh.  Hospital Course:   Principal problem  Hypovolemic shock Due to severe Blood loss anemia and? DIC. Received several units of PRBC this hospitalization. Now stable, lactic acidosis resolved. Continue Protonix. Hemoglobin of 8.6 s/p transfusion of 1 unit.  Active Problems: Acute hypoxic respiratory failure with shock Secondary to hypovolemic shock, metabolic acidosis and possible pneumonia and pulmonary edema. No signs of respiratory distress and maintaining O2 sat on room air. Significant positive balance with pulmonary edema. Was on aggressive diuresis with IV Lasix 80 mg every 6 hours, transitioned to lasix 60 mg po daily   Decompensated alcohol excess hep C cirrhosis with anasarca and acute encephalopathy Therapeutic paracentesis done on 4/17 with 3.3 L removed and placed on aggressive IV Lasix. attempted paracentesis on 4/21 without fluid yield. abdominal distension is likely from severe third spacing seen on CT. Encephalopathy resolved. Aggressive diuresis for anasarca while in house.   Severe Anemia and thrombocytopenia ? DIC with cirrhosis. Also found t have large left thigh hematoma on CT which is contribution. monitor with transfuse prn.Hemoccult-positive. elevated LDH and fibrinogen. Hematology consulted , recommended severe anemia and thrombocytopenia is contributed  by bone marrow suppression as well. Elevated LDH and haptoglobin difficult to interpret in a cirrhotic patient. Recommend supportive care. Platelets slowly improving.   Appreciate help from GI. Have signed off with plan on outpatient follow-up.  Fever on 4/24 Currently suspecting pna as cause. Continue azithromycin and omnicef on d/c - no abdominal pain reported to me by patient. - suspect pna as cause  Sinus tachycardia with SVT versus A. fib Required amiodarone drip in the ICU. Now with better control. D/c on amiodarone  Acute kidney injury Possibly due to hypovolemic shock, now with cirrhosis and pulmonary edema. Resolving with serum creatinine trending down.  Hypoglycemia Possibly due to shock and poor by mouth intake. Resolved.  Hypokalemia / Hypomagnesemia Replenishing.  -Magnesium replaced and will need reassessment  Colitis on CT abdomen Colonic wall thickening noted. GI recommend this is more edematous change and does not need intervention.   Consultations:  Physical medicine  Discharge Exam: Filed Vitals:   11/13/15 0357 11/13/15 1100  BP: 119/84 123/86  Pulse: 95 92  Temp: 99.4 F (37.4 C) 98.6 F (37 C)  Resp: 18 18    General: Pt in nad, alert and awake Cardiovascular: no cyanosis Respiratory: no increased wob, no wheezes  Discharge Instructions   Discharge Instructions    Call MD for:  difficulty breathing, headache or visual disturbances    Complete by:  As directed      Call MD for:  temperature >100.4    Complete by:  As directed      Diet - low sodium heart healthy    Complete by:  As directed      Discharge instructions    Complete by:  As directed   Please be sure to follow up with your primary care physician at the SNF within the next week     Increase activity slowly    Complete by:  As directed           Current Discharge Medication List    START taking these medications   Details  amiodarone (PACERONE) 200 MG tablet Take 1 tablet (200 mg total) by mouth daily. Qty: 30 tablet, Refills: 0    azithromycin (ZITHROMAX) 500 MG tablet Take 1 tablet (500 mg total) by mouth daily. Qty: 2 tablet,  Refills: 0    cefdinir (OMNICEF) 300 MG capsule Take 1 capsule (300 mg total) by mouth 2 (two) times daily. Qty: 6 capsule, Refills: 0    furosemide (LASIX) 20 MG tablet Take 3 tablets (60 mg total) by mouth daily. Qty: 30 tablet, Refills: 0    lactulose (CHRONULAC) 10 GM/15ML solution Take 45 mLs (30 g total) by mouth daily. Qty: 240 mL, Refills: 0    thiamine 100 MG tablet Take 1 tablet (100 mg total) by mouth daily. Qty: 30 tablet, Refills: 0      CONTINUE these medications which have NOT CHANGED   Details  omeprazole (PRILOSEC) 20 MG capsule Take 20 mg by mouth daily.    potassium chloride SA (K-DUR,KLOR-CON) 20 MEQ tablet Take 1 tablet (20 mEq total) by mouth once. Qty: 30 tablet, Refills: 0      STOP taking these medications     amLODipine (NORVASC) 5 MG tablet      hydrochlorothiazide (HYDRODIURIL) 12.5 MG tablet      lisinopril (PRINIVIL,ZESTRIL) 10 MG tablet      meloxicam (MOBIC) 15 MG tablet        Allergies  Allergen Reactions  . Lactose Intolerance (Gi)  unknown      The results of significant diagnostics from this hospitalization (including imaging, microbiology, ancillary and laboratory) are listed below for reference.    Significant Diagnostic Studies: Ct Abdomen Pelvis Wo Contrast  11/06/2015  CLINICAL DATA:  Edema. Decreasing hemoglobin. Cirrhotic patient with hemoccult positive stool. EXAM: CT ABDOMEN AND PELVIS WITHOUT CONTRAST TECHNIQUE: Multidetector CT imaging of the abdomen and pelvis was performed following the standard protocol without IV contrast. COMPARISON:  Abdominal ultrasound 10/31/2015 FINDINGS: Lower chest: Right lung base consolidation with air bronchograms. Minimal left basilar opacity. Small subpulmonic left pleural effusion. Mild cardiomegaly. Decreased density of blood pool consistent with anemia. Liver: Nodular contours with cirrhotic morphology. No focal lesion detected on this unenhanced exam. Hepatobiliary: Gallbladder  physiologically distended. There is high-density material within the dependent gallbladder and cystic duct. Common bile duct is not well-defined. Pancreas: No ductal dilatation. Spleen: Normal in size, maximal mention 12.8 cm. Adrenal glands: No nodule. Kidneys: No hydronephrosis or urolithiasis. Stomach/Bowel: Ingested contents in the stomach. Small hiatal hernia. Limited bowel assessment given intra-abdominal ascites. No large or small bowel obstruction. There is questionable colonic wall thickening at the splenic flexure of the colon. Formed stool in the ascending colon. Appendix tentatively but not definitively identified. Vascular/Lymphatic: Normal caliber abdominal aorta with trace atherosclerosis. No retroperitoneal adenopathy. Limited assessment for intra-abdominal adenopathy given mesenteric edema. Reproductive: Prostate gland normal in size. Bladder: Decompressed by Foley catheter. Other: Severe whole body wall edema and skin thickening. Moderate intra-abdominal ascites. Diffuse mesenteric edema and small volume mesenteric free fluid. No free air. Musculoskeletal: There are no acute or suspicious osseous abnormalities. Degenerative change involving the left greater than right hip and throughout the spine. IMPRESSION: 1. Probable colonic wall thickening at the splenic flexure of the colon, limited bowel evaluation given lack of enteric contrast and intra-abdominal ascites. This may be reactive or colitis. Ischemic colitis can affect this region, however there is only minimal atherosclerosis. Infectious or inflammatory etiologies are also considered. 2. Severe body wall edema, skin thickening, and mesenteric edema. Findings consistent with severe third-spacing. 3. Cirrhotic hepatic morphology. Moderate intra-abdominal ascites. No splenomegaly. 4. Right greater than left lung base consolidation with air bronchograms, pneumonia versus atelectasis. Trace left pleural effusion. 5. High-density material within  the gallbladder and cystic duct, likely sludge, as no stones were seen on recent abdominal ultrasound. Electronically Signed   By: Rubye Oaks M.D.   On: 11/06/2015 19:06   Ct Head Wo Contrast  10/31/2015  CLINICAL DATA:  Alcohol abuse. Dyspnea requiring intubation. Altered mental status. Hypotensive post intubation requiring norepinephrine. EXAM: CT HEAD WITHOUT CONTRAST TECHNIQUE: Contiguous axial images were obtained from the base of the skull through the vertex without intravenous contrast. COMPARISON:  11/12/2013 head CT. FINDINGS: Two oral route tubes enter the upper trachea and hypopharynx on the lateral scout topogram. Mild fluid is seen layering in the nasopharynx. No evidence of parenchymal hemorrhage or extra-axial fluid collection. No mass lesion, mass effect, or midline shift. No CT evidence of acute infarction. Intracranial atherosclerosis. Nonspecific mild subcortical and periventricular white matter hypodensity, most in keeping with chronic small vessel ischemic change. Cerebral volume is age appropriate. No ventriculomegaly. The visualized paranasal sinuses are essentially clear. The mastoid air cells are unopacified. No evidence of calvarial fracture. IMPRESSION: 1.  No evidence of acute intracranial abnormality. 2. Intracranial atherosclerosis and mild chronic small vessel ischemia. Electronically Signed   By: Delbert Phenix M.D.   On: 10/31/2015 20:33   US Abdomen Complete  10/31/2015  CLINICAL  DATA:  Inpatient with cirrhosis, shock and renal failure. EXAM: ABDOMEN ULTRASOUND COMPLETE COMPARISON:  01/07/2011 CT abdomen/ pelvis. FINDINGS: Gallbladder: No gallstones or wall thickening visualized. No sonographic Murphy sign noted by sonographer. Common bile duct: Diameter: 4 mm Liver: Liver parenchyma is diffusely heterogeneous in echotexture in the liver surface is diffusely irregular in contour, in keeping with cirrhosis. There is reversal of flow in the main portal vein. No liver masses  demonstrated. IVC: Not visualized. Pancreas: Not well visualized due to overlying bowel gas and other patient related factors for Spleen: Size and appearance within normal limits. Right Kidney: Length: 10.6 cm. Echogenicity within normal limits. No mass or hydronephrosis visualized. Left Kidney: Length: 11.2 cm. Echogenicity within normal limits. No mass or hydronephrosis visualized. Abdominal aorta: No aneurysm visualized. Other findings: Moderate volume ascites. IMPRESSION: 1. Cirrhosis.  No liver mass detected. 2. Reversed (hepatofugal) flow in the main portal vein. Moderate volume ascites. No splenomegaly . 3. Normal gallbladder with no cholelithiasis. No biliary ductal dilatation. Electronically Signed   By: Delbert Phenix M.D.   On: 10/31/2015 23:58   Ct Femur Left Wo Contrast  11/06/2015  CLINICAL DATA:  Chronic gastrointestinal bleeding. Swelling in the left groin and femur. Question hematoma. Initial encounter. EXAM: CT OF THE LEFT FEMUR WITHOUT CONTRAST TECHNIQUE: Multidetector CT imaging was performed according to the standard protocol. Multiplanar CT image reconstructions were also generated. COMPARISON:  None. FINDINGS: Imaging incidentally includes the right upper leg. Diffuse and intense subcutaneous edema is identified throughout. The short adductor and adductor magnus on the left are markedly swollen and hyperattenuating consistent with the presence of hematoma. Discrete measurement is not possible but the hematoma measures approximately 6.3 cm AP by 8.1 cm transverse by 14.2 cm craniocaudal. No other hematoma is identified. No soft tissue gas collection is seen. Partial visualization of degenerative change about the hips. IMPRESSION: Large appearing hematoma in the adductor musculature of the left upper leg cannot be discretely measured. Diffuse and intense subcutaneous edema in the lower pelvis and upper legs bilaterally. No focal bony abnormality. Electronically Signed   By: Drusilla Kanner  M.D.   On: 11/06/2015 17:52   US Paracentesis  11/09/2015  INDICATION: Ascites secondary to alcoholic cirrhosis. Request for diagnostic and therapeutic paracentesis. EXAM: ULTRASOUND GUIDED LEFT LOWER QUADRANT PARACENTESIS MEDICATIONS: 1% Lidocaine. COMPLICATIONS: None immediate. PROCEDURE: Informed written consent was obtained from the patient after a discussion of the risks, benefits and alternatives to treatment. A timeout was performed prior to the initiation of the procedure. Initial ultrasound scanning demonstrates a large amount of ascites within the right lower abdominal quadrant. The right lower abdomen was prepped and draped in the usual sterile fashion. 1% lidocaine with epinephrine was used for local anesthesia. Following this, a 19 gauge, 10-cm, Yueh catheter was introduced. An ultrasound image was saved for documentation purposes. The paracentesis was performed. The catheter was removed and a dressing was applied. The patient tolerated the procedure well without immediate post procedural complication. FINDINGS: A total of approximately 1.8 liters of clear yellow fluid was removed. Samples were sent to the laboratory as requested by the clinical team. IMPRESSION: Successful ultrasound-guided paracentesis yielding 1.8 liters of peritoneal fluid. Read by:  Corrin Parker, PA-C Electronically Signed   By: Richarda Overlie M.D.   On: 11/09/2015 11:15   Dg Chest Port 1 View  11/09/2015  CLINICAL DATA:  Fever, shortness of breath, hypertension EXAM: PORTABLE CHEST 1 VIEW COMPARISON:  11/06/2015 FINDINGS: Low lung volumes with bibasilar atelectasis or infiltrates.  Heart is mildly enlarged. Mild vascular congestion. No visible effusions. Left central line remains in place, unchanged. IMPRESSION: Cardiomegaly, mild vascular congestion. Bibasilar atelectasis or infiltrates. No real change since prior study. Electronically Signed   By: Charlett Nose M.D.   On: 11/09/2015 07:37   Dg Chest Port 1 View  11/06/2015   CLINICAL DATA:  Pulmonary edema, acute respiratory failure, cirrhosis, renal failure EXAM: PORTABLE CHEST 1 VIEW COMPARISON:  Portable chest x-ray of November 05, 2015 FINDINGS: The lungs are mildly hypoinflated. Parenchymal density medially at the right lung base persists. The cardiac silhouette is top-normal in size. The central pulmonary vascularity is mildly prominent. No pleural effusion or pneumothorax is observed. The left internal jugular venous catheter tip projects over the mid portion of the SVC. IMPRESSION: Mild pulmonary interstitial prominence stable likely reflecting mild interstitial edema. Confluent alveolar opacity at the right lung base medially consistent with atelectasis or pneumonia. No significant pleural effusion and no pneumothorax. Electronically Signed   By: David  [place] M.D.   On: 11/06/2015 07:20   Dg Chest Port 1 View  11/05/2015  CLINICAL DATA:  Dyspnea EXAM: PORTABLE CHEST 1 VIEW COMPARISON:  11/02/2015 FINDINGS: Left internal jugular central line again identified. Tip obscured by overlying EKG leads. Heart size and vascular pattern normal. Low lung volumes. Bilateral lower lobe opacities right worse than left. There has been mild improvement in bilateral lung base aeration. IMPRESSION: Persistent but improved bibasilar opacities. Electronically Signed   By: Esperanza Heir M.D.   On: 11/05/2015 07:11   Dg Chest Port 1 View  11/02/2015  CLINICAL DATA:  55 year old male with respiratory failure. Subsequent encounter. EXAM: PORTABLE CHEST 1 VIEW COMPARISON:  11/01/2015. FINDINGS: Endotracheal tube tip 3.6 cm above the carina. Left central line is in place with the tip angulated possibly entering the azygos vein unchanged from prior exam. Lateral view be necessary for further delineation. No gross pneumothorax. Nasogastric tube courses below the diaphragm. Tip is not included on the present exam. Asymmetric airspace disease greatest lung bases more notable on the right may represent  pulmonary edema with basilar atelectasis, pleural fluid or infiltrate. Heart size top-normal. Calcified aorta. IMPRESSION: Similar appearance of asymmetric airspace disease most notable lung bases greater on the right. Left central line tip possibly entering the azygos vein. Please see above. Electronically Signed   By: Lacy Duverney M.D.   On: 11/02/2015 08:07   Dg Chest Port 1 View  11/01/2015  CLINICAL DATA:  Central line placement. EXAM: PORTABLE CHEST 1 VIEW COMPARISON:  Yesterday. FINDINGS: Endotracheal tube in satisfactory position. Nasogastric tube extending into the stomach. Interval left jugular catheter with its tip directed superiorly. Poor inspiration. Normal sized heart. Mild increase in bibasilar opacity. Unremarkable bones. IMPRESSION: 1. Poor inspiration with increased bibasilar atelectasis. 2. Left jugular catheter tip directed superiorly. This may be in the right innominate vein, azygos vein or proximal superior vena cava. 3. No pneumothorax. Electronically Signed   By: Beckie Salts M.D.   On: 11/01/2015 07:13   Dg Chest Portable 1 View  10/31/2015  CLINICAL DATA:  55 year old male status post intubation. EXAM: PORTABLE CHEST 1 VIEW COMPARISON:  Chest x-ray 11/13/2013. FINDINGS: An endotracheal tube is in place with tip 2.8 cm above the carina. A nasogastric tube is seen extending into the stomach, however, the tip of the nasogastric tube extends below the lower margin of the image. Lung volumes are very low. Opacity at the right base presumably reflects atelectasis in the right middle and lower  lobes. Left lung appears relatively clear with minimal subsegmental atelectasis in the left lower lobe. No definite pleural effusions. No evidence of pulmonary edema. Heart size is normal. The patient is rotated to the right on today's exam, resulting in distortion of the mediastinal contours and reduced diagnostic sensitivity and specificity for mediastinal pathology. IMPRESSION: 1. Support  apparatus, as above. 2. Low lung volumes with extensive atelectasis in the right middle and lower lobes. Electronically Signed   By: Trudie Reedaniel  Entrikin M.D.   On: 10/31/2015 15:47    Microbiology: Recent Results (from the past 240 hour(s))  Culture, body fluid-bottle     Status: None   Collection Time: 11/04/15  1:04 PM  Result Value Ref Range Status   Specimen Description FLUID PERITONEAL  Final   Special Requests NONE  Final   Culture NO GROWTH 5 DAYS  Final   Report Status 11/09/2015 FINAL  Final  Gram stain     Status: None   Collection Time: 11/04/15  1:04 PM  Result Value Ref Range Status   Specimen Description FLUID PERITONEAL  Final   Special Requests NONE  Final   Gram Stain   Final    FEW WBC PRESENT,BOTH PMN AND MONONUCLEAR NO ORGANISMS SEEN    Report Status 11/04/2015 FINAL  Final  Culture, Urine     Status: None   Collection Time: 11/09/15  4:40 AM  Result Value Ref Range Status   Specimen Description URINE, RANDOM  Final   Special Requests NONE  Final   Culture NO GROWTH 1 DAY  Final   Report Status 11/10/2015 FINAL  Final  Culture, blood (routine x 2)     Status: None (Preliminary result)   Collection Time: 11/09/15  5:26 AM  Result Value Ref Range Status   Specimen Description BLOOD LEFT ANTECUBITAL  Final   Special Requests BOTTLES DRAWN AEROBIC AND ANAEROBIC 10CC EA  Final   Culture NO GROWTH 4 DAYS  Final   Report Status PENDING  Incomplete  Culture, blood (routine x 2)     Status: None (Preliminary result)   Collection Time: 11/09/15  5:31 AM  Result Value Ref Range Status   Specimen Description BLOOD RIGHT ANTECUBITAL  Final   Special Requests BOTTLES DRAWN AEROBIC AND ANAEROBIC 10CC EA  Final   Culture NO GROWTH 4 DAYS  Final   Report Status PENDING  Incomplete  Culture, body fluid-bottle     Status: None (Preliminary result)   Collection Time: 11/09/15 10:04 AM  Result Value Ref Range Status   Specimen Description FLUID PERITONEAL  Final   Special  Requests BOTTLES DRAWN AEROBIC AND ANAEROBIC 5CC  Final   Culture NO GROWTH 4 DAYS  Final   Report Status PENDING  Incomplete  Gram stain     Status: None   Collection Time: 11/09/15 10:04 AM  Result Value Ref Range Status   Specimen Description FLUID PERITONEAL  Final   Special Requests NONE  Final   Gram Stain   Final    FEW WBC PRESENT,BOTH PMN AND MONONUCLEAR NO ORGANISMS SEEN    Report Status 11/09/2015 FINAL  Final     Labs: Basic Metabolic Panel:  Recent Labs Lab 11/09/15 0530  11/10/15 0600 11/11/15 0414 11/12/15 0525 11/12/15 1845 11/13/15 0430  NA 134*  --  136 139 138  --  138  K 3.0*  --  3.0* 3.4* 3.4*  --  3.2*  CL 101  --  102 104 105  --  104  CO2 21*  --  22 25 24   --  25  GLUCOSE 133*  --  93 101* 101*  --  128*  BUN 31*  --  28* 29* 30*  --  28*  CREATININE 1.58*  --  1.57* 1.55* 1.44*  --  1.36*  CALCIUM 7.9*  --  7.8* 7.8* 7.8*  --  7.8*  MG  --   < > 0.9* 1.3* 1.0* 0.9* 1.0*  < > = values in this interval not displayed. Liver Function Tests:  Recent Labs Lab 11/07/15 0520 11/10/15 0600 11/12/15 0525  AST 85* 63* 57*  ALT 58 41 37  ALKPHOS 66 74 82  BILITOT 10.0* 13.0* 11.9*  PROT 6.4* 6.7 7.3  ALBUMIN 1.9* 2.2* 2.1*   No results for input(s): LIPASE, AMYLASE in the last 168 hours.  Recent Labs Lab 11/12/15 0525  AMMONIA 57*   CBC:  Recent Labs Lab 11/08/15 0636 11/09/15 0530 11/10/15 0600 11/11/15 0414 11/12/15 0525  WBC 11.7* 12.7* 13.6* 13.5* 13.3*  HGB 7.1* 7.0* 8.3* 8.6* 8.6*  HCT 21.2* 20.6* 25.0* 25.4* 26.3*  MCV 83.8 85.5 84.2 86.7 88.3  PLT 123* 137* 142* 141* 149*   Cardiac Enzymes: No results for input(s): CKTOTAL, CKMB, CKMBINDEX, TROPONINI in the last 168 hours. BNP: BNP (last 3 results)  Recent Labs  10/31/15 1911  BNP 694.4*    ProBNP (last 3 results) No results for input(s): PROBNP in the last 8760 hours.  CBG:  Recent Labs Lab 11/12/15 2008 11/12/15 2353 11/13/15 0348 11/13/15 0748  11/13/15 1206  GLUCAP 112* 107* 91 89 107*    Signed:  Penny Pia MD.  [facility] 11/13/2015, 3:37 PM

## 2015-11-13 NOTE — Progress Notes (Signed)
Patient ID: [name], male   DOB: 10/15/1960, 55 y.o.   MRN: [id number] Patient and family arrived from 6E with RN via hospital bed. Patient and family oriented to patient room, nurse call system, fall prevention plan, rehab schedule, rehab safety plan, and health resource notebook. Patient resting comfortably in bed with not complaints of pain. Family at bedside.

## 2015-11-13 NOTE — Progress Notes (Signed)
Bradley Oyster, MD Physician Signed Physical Medicine and Rehabilitation Consult Note 11/12/2015 5:40 AM  Related encounter: ED to Hosp-Admission (Discharged) from 10/31/2015 in [facility] 6E MEDICAL/RENAL    Expand All Collapse All        Physical Medicine and Rehabilitation Consult Reason for Consult: Debilitation/renal failure and septic shock/multi-medical Referring Physician: [facility]   HPI: [name] is a 55 y.o. right handed male with history of alcohol abuse with associated liver disease, hypertension, OSA (untreated). Per chart review patient lives alone but has supportive a supportive brother. Reported to be independent prior to admission. Presented 10/31/2015 with increasing shortness of breath required intubation ED for airway protection became obtunded. Developed hypotension. Stat CBC with hemoglobin 2.7 and no obvious source of bleeding, lactic acid 14, creatinine 2.51, INR 4.35. CT of the head negative for acute changes. Chest x-ray with extensive atelectasis right middle lobe and lower lobes. Ultrasound abdomen showed cirrhosis. No liver mass detected. Moderate volume ascites. Follow-up CT abdomen and pelvis showed a large hematoma left upper thigh. Patient was transfused. Maintained on broad-spectrum antibiotics. Hemoglobin stabilized at 7.4 after 6 units packed red blood cells. Underwent paracentesis with 3 L removed 11/02/2015. Gastroenterology follow-up as well as hematology for anemia question DIC related to cirrhosis. No current plan for EGD. Creatinine stabilized with gentle IV fluids 1.55 as well as hemoglobin 8.6. Patient was slowly extubated. Palliative care consult and for goals of care. Physical therapy evaluation has been completed and ongoing.CSW notes patient's insurance does not cover skilled nursing facility. M.D. has requested physical medicine rehabilitation consult   Review of Systems  Constitutional: Negative for fever and chills.  HENT:  Negative for hearing loss.  Eyes: Negative for blurred vision and double vision.  Respiratory: Positive for cough and shortness of breath.  Cardiovascular: Positive for leg swelling. Negative for chest pain and palpitations.  Gastrointestinal: Positive for constipation. Negative for nausea and vomiting.  Genitourinary: Positive for urgency. Negative for dysuria and hematuria.  Musculoskeletal: Positive for myalgias and joint pain.  Skin: Negative for rash.  Neurological: Positive for weakness and headaches.  Psychiatric/Behavioral: The patient has insomnia.  All other systems reviewed and are negative.  Past Medical History  Diagnosis Date  . Hypertension   . Allergy   . Arthritis   . Cataract   . Heart murmur   . Sleep apnea     Problems with sleeping  . Neuromuscular disorder (HCC)     neuropathy rt lateral calf.  . Anemia 10/31/2015   Past Surgical History  Procedure Laterality Date  . Foot surgery Right   . Nasal septum surgery    . Vasectomy     Family History  Problem Relation Age of Onset  . Atrial fibrillation Mother   . CVA Mother   . Hypertension Mother   . Emphysema Mother    Social History:  reports that he has quit smoking. His smoking use included Cigarettes. He has never used smokeless tobacco. He reports that he drinks alcohol. He reports that he does not use illicit drugs. Allergies:  Allergies  Allergen Reactions  . Lactose Intolerance (Gi)     unknown   Medications Prior to Admission  Medication Sig Dispense Refill  . amLODipine (NORVASC) 5 MG tablet Take 5 mg by mouth daily.    . hydrochlorothiazide (HYDRODIURIL) 12.5 MG tablet Take 2 tablets (25 mg total) by mouth daily. 30 tablet 3  . lisinopril (PRINIVIL,ZESTRIL) 10 MG tablet Take 1 tablet (10 mg total)  by mouth daily. 30 tablet 1  . meloxicam (MOBIC) 15 MG tablet Take 15 mg by mouth daily.     Marland Kitchen. omeprazole (PRILOSEC) 20 MG capsule Take 20 mg by mouth daily.    . potassium chloride SA (K-DUR,KLOR-CON) 20 MEQ tablet Take 1 tablet (20 mEq total) by mouth once. 30 tablet 0    Home: Home Living Family/patient expects to be discharged to:: Private residence Living Arrangements: Alone Available Help at Discharge: Family, Ophelia ShoulderFriend(s), Available PRN/intermittently Home Equipment: Dan HumphreysWalker - 2 wheels, [place] - single point, Crutches, Wheelchair - manual  Functional History: Prior Function Level of Independence: Needs assistance Gait / Transfers Assistance Needed: pt indicates he uses his crutches, but also indicates his family/friends have to help him at times.  ADL's / Homemaking Assistance Needed: Unclear how much pt was truly doing for himself. Functional Status:  Mobility: Bed Mobility Overal bed mobility: Needs Assistance Bed Mobility: Supine to Sit Rolling: Mod assist Supine to sit: Min assist Sit to supine: Max assist, +2 for physical assistance General bed mobility comments: Able to "walk" his legs of the EOB in prep for getting up; handheld assist and rials to pull to sit; used bed pad to square off hips at EOB Transfers Overall transfer level: Needs assistance Equipment used: Rolling walker (2 wheeled) Transfers: Sit to/from Stand Sit to Stand: +2 safety/equipment, Mod assist, Min assist General transfer comment: Performed 2 sit<>stand transfers, first from bed with min assist; second from low chair with mod assist; somewhat dependent on momentum Ambulation/Gait Ambulation/Gait assistance: Min assist, +2 safety/equipment Ambulation Distance (Feet): 25 Feet Assistive device: Rolling walker (2 wheeled) Gait Pattern/deviations: Wide base of support, Decreased step length - right, Decreased step length - left, Shuffle General Gait Details: noted scrotal edema leading to a very wide stance and step width; feet did not fit inside RW, so walked with trunk flexed  with RW in front of him; cues to self-monitor for activiyt tolerance Gait velocity interpretation: Below normal speed for age/gender    ADL:    Cognition: Cognition Overall Cognitive Status: Within Functional Limits for tasks assessed Orientation Level: Oriented X4 Cognition Arousal/Alertness: Awake/alert Behavior During Therapy: WFL for tasks assessed/performed Overall Cognitive Status: Within Functional Limits for tasks assessed  Blood pressure 121/90, pulse 93, temperature 99.1 F (37.3 C), temperature source Oral, resp. rate 18, height 6' (1.829 m), weight 108.047 kg (238 lb 3.2 oz), SpO2 99 %. Physical Exam  Vitals reviewed. Constitutional: He is oriented to person, place, and time.  HENT:  Head: Normocephalic and atraumatic.  Eyes: EOM are normal.  Neck: Normal range of motion. Neck supple. No tracheal deviation present. No thyromegaly present.  Cardiovascular: Normal rate and regular rhythm.  Respiratory: Effort normal and breath sounds normal. No respiratory distress.  GI: Soft. Bowel sounds are normal. He exhibits distension. There is no tenderness.  Musculoskeletal:  Bilateral 2+ edema in lower exts  Neurological: He is alert and oriented to person, place, and time.  Mood is flat but appropriate. He makes eye contact with examiner. He would not initiate conversation but appropriate to person, place, age and date of birth. Followed simple commands. UE 5/5 prox to distal. LE: 3/5 HF, 3+ KE and 4-/5 ADF/PF.  Skin: Skin is warm and dry.  Psychiatric: He has a normal mood and affect. His behavior is normal. Thought content normal.     Lab Results Last 24 Hours    Results for orders placed or performed during the hospital encounter of 10/31/15 (from the  past 24 hour(s))  Glucose, capillary Status: Abnormal   Collection Time: 11/11/15 7:29 AM  Result Value Ref Range   Glucose-Capillary 100 (H) 65 - 99 mg/dL  Glucose, capillary Status: Abnormal     Collection Time: 11/11/15 11:29 AM  Result Value Ref Range   Glucose-Capillary 134 (H) 65 - 99 mg/dL  Glucose, capillary Status: None   Collection Time: 11/11/15 4:40 PM  Result Value Ref Range   Glucose-Capillary 97 65 - 99 mg/dL  Glucose, capillary Status: Abnormal   Collection Time: 11/11/15 7:29 PM  Result Value Ref Range   Glucose-Capillary 112 (H) 65 - 99 mg/dL  Glucose, capillary Status: Abnormal   Collection Time: 11/12/15 12:24 AM  Result Value Ref Range   Glucose-Capillary 113 (H) 65 - 99 mg/dL  Glucose, capillary Status: None   Collection Time: 11/12/15 3:52 AM  Result Value Ref Range   Glucose-Capillary 95 65 - 99 mg/dL      Imaging Results (Last 48 hours)    No results found.    Assessment/Plan: Diagnosis: debility related to respiratory/sepsis, complications from cirrhosis of liver 1. Does the need for close, 24 hr/day medical supervision in concert with the patient's rehab needs make it unreasonable for this patient to be served in a less intensive setting? Yes 2. Co-Morbidities requiring supervision/potential complications: anemia, lower ext edema 3. Due to bladder management, bowel management, safety, skin/wound care, disease management, medication administration, pain management and patient education, does the patient require 24 hr/day rehab nursing? Yes 4. Does the patient require coordinated care of a physician, rehab nurse, PT (1-2 hrs/day, 5 days/week) and OT (1-2 hrs/day, 5 days/week) to address physical and functional deficits in the context of the above medical diagnosis(es)? Yes Addressing deficits in the following areas: balance, endurance, locomotion, strength, transferring, bowel/bladder control, bathing, dressing, feeding, grooming, toileting and psychosocial support 5. Can the patient actively participate in an intensive therapy program of at least 3 hrs of therapy per day at least 5  days per week? Yes 6. The potential for patient to make measurable gains while on inpatient rehab is excellent 7. Anticipated functional outcomes upon discharge from inpatient rehab are modified independent with PT, modified independent with OT, n/a with SLP. 8. Estimated rehab length of stay to reach the above functional goals is: 9-13 days 9. Does the patient have adequate social supports and living environment to accommodate these discharge functional goals? Yes 10. Anticipated D/C setting: Home 11. Anticipated post D/C treatments: HH therapy and Outpatient therapy 12. Overall Rehab/Functional Prognosis: excellent  RECOMMENDATIONS: This patient's condition is appropriate for continued rehabilitative care in the following setting: CIR Patient has agreed to participate in recommended program. Yes Note that insurance prior authorization may be required for reimbursement for recommended care.  Comment: Rehab Admissions Coordinator to follow up.  Thanks,  Bradley Oyster, MD, [facility]     11/12/2015       Revision History     Date/Time User Provider Type Action   11/12/2015 9:41 AM Bradley Oyster, MD Physician Sign   11/12/2015 6:34 AM Charlton Amor, PA-C Physician Assistant [place]   View Details Report       Routing History     Date/Time From To Method   11/12/2015 9:41 AM Bradley Oyster, MD Esperanza Richters, PA-C In [place]

## 2015-11-13 NOTE — Progress Notes (Signed)
Inpatient Rehabilitation  Met with patient at bedside.  Discussed that I await insurance authorization as well as acute MD approval.  I have a bed to offer the patient today.  Please call with questions.  Carmelia Roller., CCC/SLP Admission Coordinator  [place]  Cell [phone]

## 2015-11-13 NOTE — Progress Notes (Signed)
Physical Therapy Treatment Patient Details Name: [name] MRN: [id number] DOB: July 08, 1961 Today's Date: 11/13/2015    History of Present Illness pt presents Obtunded with Renal Failure and Shock.  pt with hx of Etoh, HTN, OSA, Cataracts, Etoh Hepatitis, Pancreatitis, Ascites, Hepatomegaly, and Neuropathy.  Palliative Care Team following as well    PT Comments    Patient making improvement with mobility and gait.  Agree with need for CIR at discharge for continued therapy with goal to return home with family.  Follow Up Recommendations  CIR     Equipment Recommendations  None recommended by PT    Recommendations for Other Services       Precautions / Restrictions Precautions Precautions: Fall Restrictions Weight Bearing Restrictions: No    Mobility  Bed Mobility Overal bed mobility: Needs Assistance Bed Mobility: Supine to Sit;Sit to Supine     Supine to sit: Min assist Sit to supine: Min assist   General bed mobility comments: Requires increased time, and assist for trunk and LE's.  Transfers Overall transfer level: Needs assistance Equipment used: Rolling walker (2 wheeled) Transfers: Sit to/from Stand Sit to Stand: Min assist;From elevated surface;+2 safety/equipment         General transfer comment: Verbal cues for hand placement.  Assist to power up to standing and to steady.  Ambulation/Gait Ambulation/Gait assistance: Min assist;Mod assist;+2 safety/equipment Ambulation Distance (Feet): 30 Feet Assistive device: Rolling walker (2 wheeled) Gait Pattern/deviations: Step-through pattern;Decreased stride length;Ataxic;Wide base of support Gait velocity: decreased Gait velocity interpretation: Below normal speed for age/gender General Gait Details: Verbal cues to stay close to RW and stand upright.  Patient keeping RW too far ahead of him.  Ataxic movements of LE's during gait.  Assist to steady and prevent falls.  DOE of 4/4 during gait.   Stairs             Wheelchair Mobility    Modified Rankin (Stroke Patients Only)       Balance             Standing balance-Leahy Scale: Poor Standing balance comment: Requires UE support for balance.  Ataxic.                    Cognition Arousal/Alertness: Awake/alert Behavior During Therapy: WFL for tasks assessed/performed Overall Cognitive Status: Within Functional Limits for tasks assessed                      Exercises General Exercises - Lower Extremity Ankle Circles/Pumps: AROM;Both;10 reps;Supine Quad Sets: AROM;Both;10 reps;Supine Short Arc Quad: AROM;Both;10 reps;Supine Hip ABduction/ADduction: AROM;Both;10 reps;Supine Straight Leg Raises: AROM;Both;10 reps;Supine    General Comments        Pertinent Vitals/Pain Pain Assessment: No/denies pain    Home Living                      Prior Function            PT Goals (current goals can now be found in the care plan section) Progress towards PT goals: Progressing toward goals    Frequency  Min 3X/week    PT Plan Discharge plan needs to be updated    Co-evaluation             End of Session Equipment Utilized During Treatment: Gait belt Activity Tolerance: Patient limited by fatigue Patient left: in bed;with call bell/phone within reach;with family/visitor present     Time: 4098-1191 PT Time Calculation (min) (ACUTE ONLY):  23 min  Charges:  $Gait Training: 8-22 mins $Therapeutic Exercise: 8-22 mins                    G Codes:      Vena AustriaDavis, Khylei Wilms H 11/13/2015, 10:49 AM Durenda HurtSusan H. Renaldo Fiddleravis, PT, [facility] Acute Rehab Services Pager [phone]

## 2015-11-13 NOTE — Progress Notes (Signed)
Occupational Therapy Evaluation Patient Details Name: [name] MRN: [id number] DOB: 09/17/1960 Today's Date: 11/13/2015    History of Present Illness pt presents Obtunded with Renal Failure and Shock.  pt with hx of Etoh, HTN, OSA, Cataracts, Etoh Hepatitis, Pancreatitis, Ascites, Hepatomegaly, and Neuropathy.  Palliative Care Team following as well   Clinical Impression   PTA, pt independent with ADL and IADL tasks, including driving. Pt with functional decline due to deficits below and currently requires min A for mobility and mod A with ADL. Pt is an excellent CIR candidate. Feel pt can reach mod I level with mobility and ADL [facility] Will follow acutely to maximize functional level of independence and facilitate D/C to next venue of care.     Follow Up Recommendations  CIR;Supervision/Assistance - 24 hour    Equipment Recommendations  Tub/shower bench;3 in 1 bedside comode    Recommendations for Other Services Rehab consult     Precautions / Restrictions Precautions Precautions: Fall Restrictions Weight Bearing Restrictions: No      Mobility Bed Mobility Overal bed mobility: Needs Assistance Bed Mobility: Supine to Sit;Sit to Supine Rolling: Supervision   Supine to sit: Min guard;HOB elevated Sit to supine: Min assist (to lift LLE onto bed)   General bed mobility comments: Requires increased time, and assist for trunk and LE's.  Transfers Overall transfer level: Needs assistance Equipment used: Rolling walker (2 wheeled) Transfers: Sit to/from Stand Sit to Stand: Min assist;From elevated surface             Balance     Sitting balance-Leahy Scale: Good       Standing balance-Leahy Scale: Poor Standing balance comment: Requires UE support for balance.  Ataxic.                            ADL Overall ADL's : Needs assistance/impaired Eating/Feeding: Set up   Grooming: Set up;Sitting   Upper Body Bathing:  Set up;Sitting   Lower Body Bathing: Moderate assistance;Sit to/from stand   Upper Body Dressing : Set up;Sitting   Lower Body Dressing: Moderate assistance;Sit to/from stand   Toilet Transfer: Minimal assistance;RW;Stand-pivot   Toileting- Clothing Manipulation and Hygiene: Maximal assistance       Functional mobility during ADLs: Minimal assistance;Rolling walker General ADL Comments: Fatigues easily with 2/4 dypsnea with minimal activity. will benefit from AE and energy conservation     Vision     Perception     Praxis      Pertinent Vitals/Pain Pain Assessment: No/denies pain     Hand Dominance Right   Extremity/Trunk Assessment Upper Extremity Assessment Upper Extremity Assessment: Generalized weakness   Lower Extremity Assessment Lower Extremity Assessment: Defer to PT evaluation   Cervical / Trunk Assessment Cervical / Trunk Assessment: Normal   Communication Communication Communication: No difficulties   Cognition Arousal/Alertness: Awake/alert Behavior During Therapy: WFL for tasks assessed/performed Overall Cognitive Status: Within Functional Limits for tasks assessed                     General Comments       Exercises Exercises: General Lower Extremity     Shoulder Instructions      Home Living Family/patient expects to be discharged to:: Private residence Living Arrangements: Alone Available Help at Discharge: Family;Friend(s);Available PRN/intermittently Type of Home: House Home Access: Stairs to enter Entrance Stairs-Number of Steps: 1   Home Layout: One level  Bathroom Shower/Tub: Tub/shower unit;Door Shower/tub characteristics: Door Firefighter: Standard Bathroom Accessibility: Yes How Accessible: Accessible via walker Home Equipment: Cane - single point;Crutches          Prior Functioning/Environment Level of Independence: Independent with assistive device(s)  Gait / Transfers Assistance Needed: used cane  furniture walker ADL's / Homemaking Assistance Needed: independent with[place]Land IADL tasks; drove        OT Diagnosis: Generalized weakness   OT Problem List: Decreased strength;Decreased activity tolerance;Impaired balance (sitting and/or standing);Decreased safety awareness;Decreased knowledge of use of DME or AE;Cardiopulmonary status limiting activity;Increased edema   OT Treatment/Interventions: Self-care/ADL training;Therapeutic exercise;Energy conservation;DME and/or AE instruction;Therapeutic activities;Patient/family education;Balance training    OT Goals(Current goals can be found in [facility], OTR/L  [phone] 05-28-17the care plan section) Acute Rehab OT Goals Patient Stated Goal: Be independent again OT Goal Formulation: With patient Time For Goal Achievement: 11/27/15 Potential to Achieve Goals: Good ADL Goals Pt Will Perform Lower Body Bathing: with modified independence;sit to/from stand;with adaptive equipment Pt Will Perform Lower Body Dressing: with modified independence;with adaptive equipment;sit to/from stand Pt Will Transfer to Toilet: with modified independence;ambulating (rw) Pt Will Perform Toileting - Clothing Manipulation and hygiene: with modified independence;sitting/lateral leans;sit to/from stand Additional ADL Goal #1: pT WILL VERBALIZE 3 ENERGY CONSERVATION TECHNQIUES FORADL INDEPENDENTLY  OT Frequency: Min 2X/week   Barriers to D/C:            Co-evaluation              End of Session Equipment Utilized During Treatment: Gait belt;Rolling walker Nurse Communication: Mobility status  Activity Tolerance: Patient tolerated treatment well Patient left: in bed;with call bell/phone within reach;with bed alarm set;with family/visitor present;with SCD's reapplied   Time: 1045-1108 OT Time Calculation (min): 23 min Charges:  OT General Charges $OT Visit: 1 Procedure OT Evaluation $OT Eval Moderate Complexity: 1 Procedure OT  Treatments $Self Care/Home Management : 8-22 mins G-Codes:    Gaige Sebo,HILLARY 12-13-15, 11:13 AM

## 2015-11-13 NOTE — Progress Notes (Signed)
[facility] Admission Coordinator Signed Physical Medicine and Rehabilitation PMR Pre-admission 11/13/2015 2:31 PM  Related encounter: ED to Hosp-Admission (Discharged) from 10/31/2015 in [facility] 6E MEDICAL/RENAL    Expand All Collapse All   PMR Admission Coordinator Pre-Admission Assessment  Patient: [name] is an 55 y.o., male MRN: [id number] DOB: 07-06-1961 Height: 6' (182.9 cm) Weight: 108.047 kg (238 lb 3.2 oz)  Insurance Information HMO: PPO: X PCP: IPA: 80/20: OTHER:  PRIMARY: BCBS Federal Policy#: [id number] Subscriber: Self CM Name: Cari Caraway Phone#: [phone] Fax#: [phone] Pre-Cert#: [id number] authorized by Talbert Forest for 11/13/15-11/23/15 with updates for extension or discharge summary due to [facility] on 11/24/15 Employer: Full time Benefits: Phone #: [phone] Name: Richarda Overlie, reference #[id number] Eff. Date: 07/21/07 Deduct: $350.00 Out of Pocket Max: $5000.00 Life Max: N/A CIR: $350 one time co-pay upon admission SNF: Not covered Outpatient: PT/OT/SLP Co-Pay: $35, 75 combined visits Home Health: 85% Co-Pay: 15% DME: 85% Co-Pay: 15% Providers: in network  Medicaid Application Date: Case Manager:  Disability Application Date: Case Worker:   Emergency Conservator, museum/gallery Information    Name Relation Home Work Mobile   Saxis Brother [phone]     Rollene Fare [phone]       Current Medical History  Patient Admitting Diagnosis: Debility related to respiratory/sepsis, complications from cirrhosis of liver   History of Present Illness: [name] is a 55 y.o. right handed male with history of alcohol abuse with associated liver disease,  hypertension, OSA (untreated). Per chart review patient lives alone but has supportive a supportive brother. Reported to be independent prior to admission. Presented 10/31/2015 with increasing shortness of breath required intubation ED for airway protection became obtunded. Developed hypotension. Stat CBC with hemoglobin 2.7 and no obvious source of bleeding, lactic acid 14, creatinine 2.51, INR 4.35. CT of the head negative for acute changes. Chest x-ray with extensive atelectasis right middle lobe and lower lobes.EKG sinus tachycardia with SVT versus atrial fibrillation placed on amiodarone drip and transition to 200 mg by mouth daily with rate controlled. Echocardiogram with ejection fraction of 55% no wall motion abnormalities. Ultrasound abdomen showed cirrhosis. No liver mass detected. Moderate volume ascites. Follow-up CT abdomen and pelvis showed a large hematoma left upper thigh. Patient was transfused. Maintained on broad-spectrum antibiotics. Hemoglobin stabilized at 7.4 after 6 units packed red blood cells. Underwent paracentesis with 3 L removed 11/02/2015. Gastroenterology follow-up as well as hematology for anemia question DIC related to cirrhosis. No current plan for EGD. Creatinine stabilized with gentle IV fluids 1.44 as well as hemoglobin 8.6. Patient was slowly extubated. Palliative care consult and for goals of care. Physical therapy evaluation has been completed and ongoing. CSW notes patient's insurance does not cover skilled nursing facility. M.D. has requested physical medicine rehabilitation consult. Patient was admitted for comprehensive rehabilitation program.       Past Medical History  Past Medical History  Diagnosis Date  . Hypertension   . Allergy   . Arthritis   . Cataract   . Heart murmur   . Sleep apnea     Problems with sleeping  . Neuromuscular disorder (HCC)     neuropathy rt lateral calf.  . Anemia 10/31/2015    Family History   family history includes Atrial fibrillation in his mother; CVA in his mother; Emphysema in his mother; Hypertension in his mother.  Prior Rehab/Hospitalizations:  Has the patient had major surgery during 100 days prior to admission? No  Current Medications   Current facility-administered medications:  .  0.9 % sodium chloride infusion, , Intravenous, Once, Praveen Mannam, MD, Last Rate: 10 mL/hr at 11/06/15 0800, 10 mL/hr at 11/06/15 0800 . 0.9 % sodium chloride infusion, , Intravenous, Once, Cyril Mourning V, MD . 0.9 % sodium chloride infusion, , Intravenous, Once, Cyril Mourning V, MD . 0.9 % sodium chloride infusion, , Intravenous, Once, Yolanda Manges, DO, Stopped at 11/04/15 [phone] . 0.9 % sodium chloride infusion, , Intravenous, Once, Erin Fulling, MD, Stopped at 11/04/15 0328 . 0.9 % sodium chloride infusion, , Intravenous, Once, Nelda Bucks, MD . 0.9 % sodium chloride infusion, , Intravenous, Once, Nishant Dhungel, MD . 0.9 % sodium chloride infusion, , Intravenous, Once, Nelda Bucks, MD . 0.9 % sodium chloride infusion, , Intravenous, Once, Nishant Dhungel, MD . acetaminophen (TYLENOL) tablet 650 mg, 650 mg, Oral, Q6H PRN, Nishant Dhungel, MD, 650 mg at 11/09/15 1211 . albuterol (PROVENTIL) (2.5 MG/3ML) 0.083% nebulizer solution 2.5 mg, 2.5 mg, Nebulization, Q4H PRN, Nishant Dhungel, MD, 2.5 mg at 11/07/15 0233 . amiodarone (PACERONE) tablet 200 mg, 200 mg, Oral, Daily, Nishant Dhungel, MD, 200 mg at 11/13/15 1006 . azelastine (ASTELIN) 0.1 % nasal spray 2 spray, 2 spray, Each Nare, BID, Nishant Dhungel, MD, 2 spray at 11/06/15 1000 . azithromycin (ZITHROMAX) tablet 500 mg, 500 mg, Oral, Daily, Nishant Dhungel, MD, 500 mg at 11/13/15 1006 . cefTRIAXone (ROCEPHIN) 2 g in dextrose 5 % 50 mL IVPB, 2 g, Intravenous, Q24H, Nishant Dhungel, MD, 2 g at 11/13/15 1006 . diphenhydrAMINE (BENADRYL) capsule 25 mg, 25 mg, Oral, Once, [employer], PA-C, 25 mg at  11/11/15 2235 . feeding supplement (PRO-STAT SUGAR FREE 64) liquid 30 mL, 30 mL, Oral, BID, Nishant Dhungel, MD, 30 mL at 11/13/15 1006 . folic acid (FOLVITE) tablet 1 mg, 1 mg, Oral, Daily, Nelda Bucks, MD, 1 mg at 11/13/15 1006 . furosemide (LASIX) injection 80 mg, 80 mg, Intravenous, Q6H, Nishant Dhungel, MD, 80 mg at 11/13/15 1300 . furosemide (LASIX) tablet 60 mg, 60 mg, Oral, Daily, Penny Pia, MD, 60 mg at 11/13/15 1006 . HYDROmorphone (DILAUDID) injection 0.5 mg, 0.5 mg, Intravenous, Q4H PRN, Hillary Bow, DO, 0.5 mg at 11/11/15 1558 . insulin aspart (novoLOG) injection 0-9 Units, 0-9 Units, Subcutaneous, 6 times per day, Coralyn Helling, MD, 1 Units at 11/12/15 1826 . lactulose (CHRONULAC) 10 GM/15ML solution 30 g, 30 g, Oral, Daily, Marianne L York, PA-C, 30 g at 11/13/15 1006 . loratadine (CLARITIN) tablet 10 mg, 10 mg, Oral, Daily PRN, Rolan Lipa, NP, 10 mg at 11/13/15 1006 . ondansetron (ZOFRAN) injection 4 mg, 4 mg, Intravenous, Q6H PRN, Roma Kayser Schorr, NP, 4 mg at 11/10/15 0740 . pantoprazole (PROTONIX) EC tablet 40 mg, 40 mg, Oral, Daily, Nelda Bucks, MD, 40 mg at 11/13/15 1006 . pneumococcal 23 valent vaccine (PNU-IMMUNE) injection 0.5 mL, 0.5 mL, Intramuscular, Tomorrow-1000, Oretha Milch, MD, Stopped at 11/04/15 1000 . potassium chloride SA (K-DUR,KLOR-CON) CR tablet 40 mEq, 40 mEq, Oral, BID, Nishant Dhungel, MD, 40 mEq at 11/13/15 1006 . sodium chloride flush (NS) 0.9 % injection 10-40 mL, 10-40 mL, Intracatheter, PRN, Nishant Dhungel, MD, 20 mL at 11/13/15 0432 . sodium chloride flush (NS) 0.9 % injection 3 mL, 3 mL, Intravenous, PRN, Leslye Peer, MD, 10 mL at 11/01/15 2257 . thiamine (VITAMIN B-1) tablet 100 mg, 100 mg, Oral, Daily, Nelda Bucks, MD, 100 mg at 11/13/15 1006  Patients Current Diet: Diet regular Room service appropriate?: Yes; Fluid consistency:: Thin; Fluid restriction:: 1200  mL Fluid Diet - low sodium  heart healthy  Precautions / Restrictions Precautions Precautions: Fall Restrictions Weight Bearing Restrictions: No   Has the patient had 2 or more falls or a fall with injury in the past year?No  Prior Activity Level Limited Community (1-2x/wk): Patient reported that he got out of the house and drove when he felt good, if he did not feel good and needed to go out he would have his companion Lupita LeashDonna drive him if needed after she got off work.  Home Assistive Devices / Equipment Home Assistive Devices/Equipment: None Home Equipment: Cane - single point, Crutches  Prior Device Use: Indicate devices/aids used by the patient prior to current illness, exacerbation or injury? [employer]

## 2015-11-14 ENCOUNTER — Inpatient Hospital Stay (HOSPITAL_COMMUNITY): Payer: Federal, State, Local not specified - PPO | Admitting: Occupational Therapy

## 2015-11-14 ENCOUNTER — Inpatient Hospital Stay (HOSPITAL_COMMUNITY): Payer: Federal, State, Local not specified - PPO | Admitting: Physical Therapy

## 2015-11-14 LAB — GLUCOSE, CAPILLARY
GLUCOSE-CAPILLARY: 91 mg/dL (ref 65–99)
GLUCOSE-CAPILLARY: 119 mg/dL — AB (ref 65–99)
Glucose-Capillary: 98 mg/dL (ref 65–99)
Glucose-Capillary: 114 mg/dL — ABNORMAL HIGH (ref 65–99)

## 2015-11-14 LAB — CULTURE, BLOOD (ROUTINE X 2)
Culture: NO GROWTH
Culture: NO GROWTH

## 2015-11-14 LAB — CULTURE, BODY FLUID W GRAM STAIN -BOTTLE: Culture: NO GROWTH

## 2015-11-14 LAB — CULTURE, BODY FLUID-BOTTLE

## 2015-11-14 NOTE — Progress Notes (Signed)
55 y.o. right handed male with history of alcohol abuse with associated liver disease, hypertension, OSA (untreated). Per chart review patient lives alone but has supportive a supportive brother. Reported to be independent prior to admission. Presented 10/31/2015 with increasing shortness of breath required intubation ED for airway protection became obtunded. Developed hypotension. Stat CBC with hemoglobin 2.7 and no obvious source of bleeding, lactic acid 14, creatinine 2.51, INR 4.35. CT of the head negative for acute changes. Chest x-ray with extensive atelectasis right middle lobe and lower lobes.EKG sinus tachycardia with SVT versus atrial fibrillation placed on amiodarone drip and transition to 200 mg by mouth daily with rate controlled. Echocardiogram with ejection fraction of 55% no wall motion abnormalities. Ultrasound abdomen showed cirrhosis. No liver mass detected. Moderate volume ascites. Follow-up CT abdomen and pelvis showed a large hematoma left upper thigh. Patient was transfused. Maintained on broad-spectrum antibiotics. Hemoglobin stabilized at 7.4 after 6 units packed red blood cells. Underwent paracentesis with 3 L removed 11/02/2015. Gastroenterology follow-up as well as hematology for anemia question DIC related to cirrhosis. No current plan for EGD. Creatinine stabilized with gentle IV fluids 1.44 as well as hemoglobin 8.6  Subjective/Complaints:  Abdomen getting tight again.No pain issues  You systems negative for chest pain, shortness of breath, nausea, vomiting, diarrhea Objective: Vital Signs: Blood pressure 131/99, pulse 86, temperature 98.7 F (37.1 C), temperature source Oral, resp. rate 20, height 6' (1.829 m), weight 107.502 kg (237 lb), SpO2 100 %. No results found. Results for orders placed or performed during the hospital encounter of 11/13/15 (from the past 72 hour(s))  Glucose, capillary     Status: Abnormal   Collection Time: 11/13/15  5:57 PM  Result Value Ref  Range   Glucose-Capillary 106 (H) 65 - 99 mg/dL  Glucose, capillary     Status: Abnormal   Collection Time: 11/13/15  9:58 PM  Result Value Ref Range   Glucose-Capillary 102 (H) 65 - 99 mg/dL   Comment 1 Notify RN   Glucose, capillary     Status: None   Collection Time: 11/14/15  6:40 AM  Result Value Ref Range   Glucose-Capillary 91 65 - 99 mg/dL   Comment 1 Notify RN      HEENT: icteric Cardio: RRR and no murmurs Resp: CTA B/L and unlabored GI: Distention and ascites Extremity:  Edema edema in both thighs but minimal edema in the feet Skin:   Other jaundice Neuro: Alert/Oriented, Normal Sensory and Abnormal Motor 4/5 bilateral deltoid, biceps, triceps, grip, 2 minus hip flexor knee extensor 3 minus ankle dorsiflexor and plantar flexor Musc/Skel:  Normal Gen. no acute distress   Assessment/Plan: 1. Functional deficits secondary to Debilitation secondary to respiratory failure/sepsis, complications from cirrhosis of liver/ascites. which require 3+ hours per day of interdisciplinary therapy in a comprehensive inpatient rehab setting. Physiatrist is providing close team supervision and 24 hour management of active medical problems listed below. Physiatrist and rehab team continue to assess barriers to discharge/monitor patient progress toward functional and medical goals. FIM:       Function - Toileting Toileting activity did not occur: N/A  Function - ArchivistToilet Transfers Toilet transfer activity did not occur: N/A  Function - Chair/bed transfer Chair/bed transfer activity did not occur: N/A     Function - Comprehension Comprehension: Auditory Comprehension assist level: Understands basic 75 - 89% of the time/ requires cueing 10 - 24% of the time  Function - Expression Expression: Verbal Expression assist level: Expresses basic 75 - 89% of  the time/requires cueing 10 - 24% of the time. Needs helper to occlude trach/needs to repeat words.  Function - Social  Interaction Social Interaction assist level: Interacts appropriately 75 - 89% of the time - Needs redirection for appropriate language or to initiate interaction.  Function - Problem Solving Problem solving assist level: Solves basic 50 - 74% of the time/requires cueing 25 - 49% of the time  Function - Memory Memory assist level: Recognizes or recalls 50 - 74% of the time/requires cueing 25 - 49% of the time Patient normally able to recall (first 3 days only): Staff names and faces, That he or she is in a hospital    Medical Problem List and Plan: 1.  Debilitation secondary to respiratory failure/sepsis, complications from cirrhosis of liver/ascites. Status post paracentesis 11/02/2015             -Initiate rehabilitation therapies today 2.  DVT Prophylaxis/Anticoagulation: SCDs. Monitor for any signs of DVT 3. Pain Management: Tylenol as needed 4. Anemia/thrombocytopenia question DIC related to cirrhosis. Status post transfused multiple units. Follow-up CBC 5. Neuropsych: This patient is capable of making decisions on his own behalf. 6. Skin/Wound Care: Routine skin checks 7. Fluids/Electrolytes/Nutrition: Routine I&O's with follow-up chemistries 8. Hypertension. No current antihypertensive medication. Follow with increased mobility 9. Acute renal insufficiency. Resolving. Follow-up chemistries 10. Sinus tachycardia with SVT versus atrial fibrillation. Amiodarone 200 mg daily. Filed Vitals:   11/13/15 1800 11/14/15 0449  BP: 141/99 131/99  Pulse: 97 86  Temp: 99.7 F (37.6 C) 98.7 F (37.1 C)  Resp: 18 20   11. Liver failure with ascites will likely need repeat paracentesis next week  LOS (Days) 1 A FACE TO FACE EVALUATION WAS PERFORMED  [name] 11/14/2015, 10:46 AM

## 2015-11-14 NOTE — Evaluation (Signed)
Occupational Therapy Assessment and Plan  Patient Details  Name: [name] MRN: [id number] Date of Birth: 04/09/1961  OT Diagnosis: abnormal posture, muscle weakness (generalized) and pain in joint Rehab Potential: Rehab Potential (ACUTE ONLY): Good ELOS: 7-9 days   Today's Date: 11/14/2015 OT Individual Time:  - 1300-1430  (90 min)       Problem List:  Patient Active Problem List   Diagnosis Date Noted  . Debilitated 11/13/2015  . Encephalopathy, hepatic (HCC)   . Goals of care, counseling/discussion   . Counseling regarding goals of care   . Decompensated liver disease (HCC)   . Anasarca   . Bleeding   . Palliative care encounter   . Absolute anemia   . Bilateral lower extremity edema   . Acute respiratory failure with hypoxia (HCC)   . AKI (acute kidney injury) (HCC)   . Alcoholic cirrhosis of liver with ascites (HCC)   . Altered mental status   . Elevated INR   . Lactic acidosis   . Subacute liver failure without hepatic coma   . Protein-calorie malnutrition, severe (HCC) 11/02/2015  . Shock (HCC) 10/31/2015  . Anemia 10/31/2015  . Cirrhosis (HCC) 10/31/2015  . Acute respiratory failure (HCC) 10/31/2015  . Pressure ulcer 10/31/2015  . Thrombocytopenia (HCC) 11/28/2014  . Allergic rhinitis 11/14/2014  . Arthritis 11/14/2014  . Cataracts, bilateral 11/14/2014  . Sleep apnea 11/14/2014  . HTN (hypertension) 11/14/2014  . Neuropathy (HCC) 11/14/2014  . Achilles tendon tear 11/14/2014  . Pedal edema 11/14/2014  . Renal failure 11/12/2013  . Syncope 11/12/2013    Past Medical History:  Past Medical History  Diagnosis Date  . Hypertension   . Allergy   . Arthritis   . Cataract   . Heart murmur   . Sleep apnea     Problems with sleeping  . Neuromuscular disorder (HCC)     neuropathy rt lateral calf.  . Anemia 10/31/2015   Past Surgical History:  Past Surgical History  Procedure Laterality Date  . Foot surgery Right   . Nasal septum surgery    .  Vasectomy      Assessment & Plan Clinical Impression:  HPI: [name] is a 55 y.o. right handed male with history of alcohol abuse with associated liver disease, hypertension, OSA (untreated). Per chart review patient lives alone but has supportive a supportive brother. Reported to be independent prior to admission. Presented 10/31/2015 with increasing shortness of breath required intubation ED for airway protection became obtunded. Developed hypotension. Stat CBC with hemoglobin 2.7 and no obvious source of bleeding, lactic acid 14, creatinine 2.51, INR 4.35. CT of the head negative for acute changes. Chest x-ray with extensive atelectasis right middle lobe and lower lobes.EKG sinus tachycardia with SVT versus atrial fibrillation placed on amiodarone drip and transition to 200 mg by mouth daily with rate controlled. Echocardiogram with ejection fraction of 55% no wall motion abnormalities. Ultrasound abdomen showed cirrhosis. No liver mass detected. Moderate volume ascites. Follow-up CT abdomen and pelvis showed a large hematoma left upper thigh. Patient was transfused. Maintained on broad-spectrum antibiotics. Hemoglobin stabilized at 7.4 after 6 units packed red blood cells. Underwent paracentesis with 3 L removed 11/02/2015. Gastroenterology follow-up as well as hematology for anemia question DIC related to cirrhosis. No current plan for EGD. Creatinine stabilized with gentle IV fluids 1.44 as well as hemoglobin 8.6. Patient was slowly extubated. Palliative care consult and for goals of care. Physical therapy evaluation has been completed and ongoing.CSW notes patient's insurance   does not cover skilled nursing facilityPatient transferred to [facility] on 11/13/2015.    Patient currently requires mod with basic self-care skills secondary to muscle weakness and muscle joint tightness and decreased safety awareness.  Prior to hospitalization, patient could complete BADL with mod.  Patient will benefit from  skilled intervention to increase independence with basic self-care skills and increase level of independence with iADL prior to discharge home with care partner.  Anticipate patient will require intermittent supervision and follow up home health.  OT - End of Session Activity Tolerance: Tolerates 10 - 20 min activity with multiple rests Endurance Deficit: Yes Endurance Deficit Description: increased SOB with ambulated OT Assessment Rehab Potential (ACUTE ONLY): Good Barriers to Discharge: Inaccessible home environment OT Patient demonstrates impairments in the following area(s): Balance;Edema;Endurance;Motor;Safety;Pain OT Basic ADL's Functional Problem(s): Grooming;Bathing;Dressing;Toileting OT Advanced ADL's Functional Problem(s): Simple Meal Preparation;Laundry;Light Housekeeping OT Transfers Functional Problem(s): Toilet (Pt does not shower) OT Additional Impairment(s): None OT Plan OT Intensity: Minimum of 1-2 x/day, 45 to 90 minutes OT Frequency: 5 out of 7 days OT Duration/Estimated Length of Stay: 7-9 days OT Treatment/Interventions: Balance/vestibular training;Discharge planning;DME/adaptive equipment instruction;Functional mobility training;Pain management;Patient/family education;Self Care/advanced ADL retraining;Therapeutic Activities;Therapeutic Exercise;UE/LE Strength taining/ROM;UE/LE Coordination activities OT Self Feeding Anticipated Outcome(s): independent OT Basic Self-Care Anticipated Outcome(s): mod I OT Toileting Anticipated Outcome(s): mod I OT Bathroom Transfers Anticipated Outcome(s): mod I to toilet OT Recommendation Recommendations for Other Services: Other (comment) (no needs identified) Patient destination: Home Follow Up Recommendations: Home health OT Equipment Recommended: 3 in 1 bedside comode   Skilled Therapeutic Intervention Engaged in functional mobility, transfers, DME needs, standing balance.  Pt ambulated with RW to bathroom.  OT provided 3n1  over toilet for pt's increased functional independence.  Pt able to get up/down with min assist and attend to clothes management.  Pt ambulated to sink and washed hands. Explained OT goals, POC, and ELOS.   He was left in recliner with all needs in reach.    OT Evaluation Precautions/Restrictions  Precautions Precautions: Fall Restrictions Weight Bearing Restrictions: No      Pain Pain Assessment Pain Assessment: No/denies pain Pain Score: 0-No pain Home Living/Prior Functioning Home Living Family/patient expects to be discharged to:: Private residence Living Arrangements: Alone Available Help at Discharge: Family, Friend(s), Available PRN/intermittently Type of Home: House Home Access: Stairs to enter Entrance Stairs-Number of Steps: 1 Entrance Stairs-Rails: Left Home Layout: One level Bathroom Shower/Tub: Tub/shower unit, Door Bathroom Toilet: Standard Bathroom Accessibility: Yes Additional Comments: has SPC that he uses at all times.   Lives With: Alone IADL History Homemaking Responsibilities: Yes Meal Prep Responsibility: Primary Laundry Responsibility: Primary Cleaning Responsibility: Primary Bill Paying/Finance Responsibility: Primary Shopping Responsibility: Primary Current License: Yes Mode of Transportation: Car Occupation: Workers comp Leisure and Hobbies: use to bowl;darts, watch tv Prior Function Level of Independence: Independent with basic ADLs, Independent with transfers  Able to Take Stairs?: Yes Driving: Yes Vocation: Workers comp Comments: home bound activities.     Vision/Perception  Vision- History Baseline Vision/History: Wears glasses Wears Glasses: Reading only Patient Visual Report: No change from baseline Vision- Assessment Vision Assessment?: No apparent visual deficits  Cognition Overall Cognitive Status: Within Functional Limits for tasks assessed Arousal/Alertness: Awake/alert Orientation Level: Person;Place;Situation Person:  Oriented Place: Oriented Situation: Disoriented Year: 2017 Month: April Day of Week: Correct Memory: Impaired Memory Impairment: Decreased short term memory Decreased Short Term Memory: Functional complex Immediate Memory Recall: Sock;Blue;Bed Memory Recall: Sock;Blue;Bed Memory Recall Sock: Without Cue Memory Recall Blue: Without Cue Memory Recall   Bed: Without Cue Attention: Selective Selective Attention: Appears intact Awareness: Appears intact Problem Solving: Appears intact Problem Solving Impairment: Verbal basic;Functional basic Executive Function: Decision Making Decision Making: Appears intact Safety/Judgment: Impaired Comments: decreased safety with transfers due to poor retention of new information.  Sensation Sensation Light Touch:  (intact for BUE) Stereognosis: Not tested Hot/Cold: Not tested Proprioception: Appears Intact Additional Comments: BLE decrease light touch appreciaiton distal to ankle.  Coordination Gross Motor Movements are Fluid and Coordinated: No (mild tremor) Fine Motor Movements are Fluid and Coordinated: No (mile tremor) Coordination and Movement Description: Labored LE movement with mild discordinaiton  Motor  Motor Motor: Other (comment) Motor - Skilled Clinical Observations: decreased movement/coordination due to body habitus from Abdominal and BLE edema.  Mobility  Bed Mobility Bed Mobility: Rolling Right;Rolling Left;Supine to Sit;Sit to Supine Rolling Right: 4: Min assist Rolling Right Details: Tactile cues for weight shifting;Tactile cues for placement;Verbal cues for sequencing;Verbal cues for technique;Verbal cues for precautions/safety Rolling Left: 4: Min assist Rolling Left Details: Tactile cues for sequencing;Tactile cues for weight shifting;Verbal cues for sequencing;Verbal cues for technique;Verbal cues for precautions/safety Supine to Sit: 4: Min assist Supine to Sit Details: Verbal cues for sequencing;Verbal cues for  technique;Verbal cues for precautions/safety;Tactile cues for placement Sit to Supine: 3: Mod assist Sit to Supine - Details: Tactile cues for sequencing;Tactile cues for weight shifting;Tactile cues for posture;Verbal cues for sequencing;Verbal cues for technique;Verbal cues for safe use of DME/AE;Verbal cues for precautions/safety Transfers Sit to Stand: 3: Mod assist Sit to Stand Details: Tactile cues for placement;Verbal cues for sequencing;Verbal cues for technique;Verbal cues for precautions/safety;Verbal cues for safe use of DME/AE;Tactile cues for weight shifting Stand to Sit: 3: Mod assist Stand to Sit Details (indicate cue type and reason): Verbal cues for sequencing;Verbal cues for technique;Verbal cues for precautions/safety;Verbal cues for gait pattern;Tactile cues for placement  Trunk/Postural Assessment  Cervical Assessment Cervical Assessment: Within Functional Limits Thoracic Assessment Thoracic Assessment: Within Functional Limits Lumbar Assessment Lumbar Assessment: Exceptions to [facility] Lumbar AROM Overall Lumbar AROM Comments: posterior pelvic tilt in sitting Postural Control Postural Control: Deficits on evaluation Righting Reactions: Poor; decreased segmental movement  Balance Balance Balance Assessed: Yes Static Sitting Balance Static Sitting - Comment/# of Minutes: Required increased time to find Balance, able to maintain without UE support. Dynamic Sitting Balance Dynamic Sitting - Balance Support: Right upper extremity supported;Feet supported Sitting balance - Comments: Required at least 1 UE supported to maintain balnace, Mod A to prevent LOB with reaching and no UE support.  Static Standing Balance Static Standing - Comment/# of Minutes: mod A without A. Supervision A- min A with AD.   Dynamic Standing Balance Dynamic Standing - Level of Assistance: 3: Mod assist Dynamic Standing - Comments: Required BUE support to maintain dynamic balance.   Extremity/Trunk  Assessment RUE Assessment RUE Assessment: Within Functional Limits LUE Assessment LUE Assessment: Within Functional Limits   See Function Navigator for Current Functional Status.   Refer to Care Plan for Long Term Goals  Recommendations for other services: None  Discharge Criteria: Patient will be discharged from OT if patient refuses treatment 3 consecutive times without medical reason, if treatment goals not met, if there is a change in medical status, if patient makes no progress towards goals or if patient is discharged from hospital.  The above assessment, treatment plan, treatment alternatives and goals were discussed and mutually agreed upon: by patient  Lisa Roca 11/14/2015, 2:13 PM

## 2015-11-14 NOTE — Evaluation (Signed)
Physical Therapy Assessment and Plan  Patient Details  Name: [name] MRN: [id number] Date of Birth: 09/13/1960  PT Diagnosis: Abnormal posture, Abnormality of gait, Difficulty walking, Edema and Muscle weakness Rehab Potential: Fair ELOS:   7-14 days.   Today's Date: 11/14/2015 PT Individual Time: 1100-1200 AND 1615-1700  Individual treatment Time: 60 minutes and 45 minutes.       Problem List:  Patient Active Problem List   Diagnosis Date Noted  . Debilitated 11/13/2015  . Encephalopathy, hepatic ([place])   . Goals of care, counseling/discussion   . Counseling regarding goals of care   . Decompensated liver disease ([place])   . Anasarca   . Bleeding   . Palliative care encounter   . Absolute anemia   . Bilateral lower extremity edema   . Acute respiratory failure with hypoxia ([place])   . AKI (acute kidney injury) ([place])   . Alcoholic cirrhosis of liver with ascites ([place])   . Altered mental status   . Elevated INR   . Lactic acidosis   . Subacute liver failure without hepatic coma   . Protein-calorie malnutrition, severe ([place]) 11/02/2015  . Shock ([place]) 10/31/2015  . Anemia 10/31/2015  . Cirrhosis ([place]) 10/31/2015  . Acute respiratory failure ([place]) 10/31/2015  . Pressure ulcer 10/31/2015  . Thrombocytopenia ([place]) 11/28/2014  . Allergic rhinitis 11/14/2014  . Arthritis 11/14/2014  . Cataracts, bilateral 11/14/2014  . Sleep apnea 11/14/2014  . HTN (hypertension) 11/14/2014  . Neuropathy ([place]) 11/14/2014  . Achilles tendon tear 11/14/2014  . Pedal edema 11/14/2014  . Renal failure 11/12/2013  . Syncope 11/12/2013    Past Medical History:  Past Medical History  Diagnosis Date  . Hypertension   . Allergy   . Arthritis   . Cataract   . Heart murmur   . Sleep apnea     Problems with sleeping  . Neuromuscular disorder ([facility])     neuropathy rt lateral calf.  . Anemia 10/31/2015   Past Surgical History:  Past Surgical History  Procedure Laterality Date  . Foot  surgery Right   . Nasal septum surgery    . Vasectomy      Assessment & Plan Clinical Impression: Patient is a39 y.o. right handed male with history of alcohol abuse with associated liver disease, hypertension, OSA (untreated). Per chart review patient lives alone but has supportive a supportive brother. Reported to be independent prior to admission. Presented 10/31/2015 with increasing shortness of breath required intubation ED for airway protection became obtunded. Developed hypotension. Stat CBC with hemoglobin 2.7 and no obvious source of bleeding, lactic acid 14, creatinine 2.51, INR 4.35. CT of the head negative for acute changes. Chest x-ray with extensive atelectasis right middle lobe and lower lobes.EKG sinus tachycardia with SVT versus atrial fibrillation placed on amiodarone drip and transition to 200 mg by mouth daily with rate controlled. Echocardiogram with ejection fraction of 55% no wall motion abnormalities. Ultrasound abdomen showed cirrhosis. No liver mass detected. Moderate volume ascites. Follow-up CT abdomen and pelvis showed a large hematoma left upper thigh. Patient was transfused. Maintained on broad-spectrum antibiotics. Hemoglobin stabilized at 7.4 after 6 units packed red blood cells. Underwent paracentesis with 3 L removed 11/02/2015.  Patient transferred to CIR on 11/13/2015 .   Patient currently requires mod with mobility secondary to muscle weakness and muscle joint tightness, decreased cardiorespiratoy endurance, decreased coordination, decreased problem solving, decreased safety awareness and decreased memory and decreased standing balance, decreased postural control and decreased balance strategies.  Prior  to hospitalization, patient was modified independent  with mobility and lived with Alone in a House home.  Home access is 1Stairs to enter.  Patient will benefit from skilled PT intervention to maximize safe functional mobility, minimize fall risk and decrease caregiver  burden for planned discharge home with intermittent assist.  Anticipate patient will benefit from follow up [facility] at discharge.  PT - End of Session Activity Tolerance: Tolerates 30+ min activity with multiple rests Endurance Deficit: Yes PT Assessment Rehab Potential (ACUTE/IP ONLY): Fair Barriers to Discharge: Decreased caregiver support PT Patient demonstrates impairments in the following area(s): Balance;Edema;Endurance;Motor;Safety;Sensory PT Transfers Functional Problem(s): Bed Mobility;Bed to Chair;Car;Furniture;Floor PT Locomotion Functional Problem(s): Ambulation;Wheelchair Mobility;Stairs PT Plan PT Intensity: Minimum of 1-2 x/day ,45 to 90 minutes PT Frequency: 5 out of 7 days PT Treatment/Interventions: Ambulation/gait training;Discharge planning;Functional mobility training;Psychosocial support;Therapeutic Activities;Visual/perceptual remediation/compensation;Balance/vestibular training;Disease management/prevention;Neuromuscular re-education;Skin care/wound management;Therapeutic Exercise;Wheelchair propulsion/positioning;Cognitive remediation/compensation;DME/adaptive equipment instruction;Pain management;UE/LE Strength taining/ROM;Community reintegration;Patient/family education;Stair training;UE/LE Coordination activities PT Transfers Anticipated Outcome(s): Mod I with LRAD  PT Locomotion Anticipated Outcome(s): Supervision  to mod I with LRAD  PT Recommendation Follow Up Recommendations: Home health PT Patient destination: Home Equipment Recommended: Wheelchair (measurements);Wheelchair cushion (measurements);Rolling walker with 5" wheels;To be determined  Skilled Therapeutic Intervention Session 1:  Patient received supine in bed and agreeable to PT. PT Eval performed; see below. Patient instructed in car transfer with Max A from PT for BLE lift into car and heavy cues for AD management and Positioning. Stand pivot transfer performed with mod A with RW. Squat pivot transfer  performed without AD  and max A to prevent anterior LOB. Throughout treatment PT was required to provide mod-max cues for safety prior to and during transfers.   Session 2.  Patient Gait in room to BR for 29f x 2 with min- Mod A and cues for AD management into bathroom. Toilet transfer with mod A and min cues for UE placement. Patient able to manage don and doff of pants with min A from PT. WC mobility in hall for 1239fwith supervision A from PT and cues for improved use of BUE propulsion to navigate obstacles in the hall. Gait on uneven surface for 10 ft with mod A from PT and RW. PT instructed patient in Berg balance test; see below and flow sheet for details. Patient returned to room and left in recliner with call bell within reach and all other needs met.   PT Evaluation Precautions/Restrictions Precautions Precautions: Fall Restrictions Weight Bearing Restrictions: No General Chart Reviewed: Yes Additional Pertinent History: Cirrosis, HTN, Anemia.  Family/Caregiver Present: No Vital Signs Pain Pain Assessment Pain Assessment: No/denies pain Pain Score: 0-No pain Home Living/Prior Functioning Home Living Available Help at Discharge: Family;Friend(s);Available PRN/intermittently Type of Home: House Home Access: Stairs to enter [employer] Steps: 1 Entrance Stairs-Rails: Left Home Layout: One level Bathroom Shower/Tub: Tub/shower unit;Door Bathroom Toilet: Standard Bathroom Accessibility: Yes Additional Comments: has SPC that he uses at all times.   Lives With: Alone Prior Function Level of Independence: Requires assistive device for independence;Independent with transfers;Needs assistance with homemaking;Needs assistance with ADLs  Able to Take Stairs?: Yes Driving: Yes Vocation: On disability Comments: home bound activities.  Vision/Perception     Cognition Orientation Level: Oriented X4 Memory: Impaired Memory Impairment: Decreased short term  memory Decreased Short Term Memory: Functional complex Awareness: Appears intact Problem Solving: Impaired Problem Solving Impairment: Functional basic;Functional complex Safety/Judgment: Impaired Comments: decreased safety with transfers due to poor retention of new information.  Sensation Sensation Light  Touch: Impaired by gross assessment Stereognosis: Not tested Hot/Cold: Not tested Proprioception: Appears Intact Additional Comments: BLE decrease light touch appreciaiton distal to ankle.  Coordination Gross Motor Movements are Fluid and Coordinated: No Fine Motor Movements are Fluid and Coordinated: No Coordination and Movement Description: Labored LE movement with mild discordinaiton  Motor  Motor Motor: Other (comment) Motor - Skilled Clinical Observations: decreased movement/coordination due to body habitus from Abdominal and BLE edema.   Mobility Bed Mobility Bed Mobility: Rolling Right;Rolling Left;Supine to Sit;Sit to Supine Rolling Right: 4: Min assist Rolling Right Details: Tactile cues for weight shifting;Tactile cues for placement;Verbal cues for sequencing;Verbal cues for technique;Verbal cues for precautions/safety Rolling Left: 4: Min assist Rolling Left Details: Tactile cues for sequencing;Tactile cues for weight shifting;Verbal cues for sequencing;Verbal cues for technique;Verbal cues for precautions/safety Supine to Sit: 4: Min assist Supine to Sit Details: Verbal cues for sequencing;Verbal cues for technique;Verbal cues for precautions/safety;Tactile cues for placement Sit to Supine: 3: Mod assist Sit to Supine - Details: Tactile cues for sequencing;Tactile cues for weight shifting;Tactile cues for posture;Verbal cues for sequencing;Verbal cues for technique;Verbal cues for safe use of DME/AE;Verbal cues for precautions/safety Transfers Transfers: Yes Sit to Stand: 3: Mod assist Sit to Stand Details: Tactile cues for placement;Verbal cues for sequencing;Verbal  cues for technique;Verbal cues for precautions/safety;Verbal cues for safe use of DME/AE;Tactile cues for weight shifting Stand to Sit: 3: Mod assist Stand to Sit Details (indicate cue type and reason): Verbal cues for sequencing;Verbal cues for technique;Verbal cues for precautions/safety;Verbal cues for gait pattern;Tactile cues for placement Stand Pivot Transfers: 3: Mod assist Stand Pivot Transfer Details: Tactile cues for posture;Tactile cues for placement;Verbal cues for sequencing;Verbal cues for technique;Verbal cues for precautions/safety;Verbal cues for safe use of DME/AE;Tactile cues for weight shifting Squat Pivot Transfers: 2: Max Risk manager Details: Tactile cues for sequencing;Tactile cues for weight shifting;Visual cues/gestures for precautions/safety;Visual cues for safe use of DME/AE;Verbal cues for sequencing;Verbal cues for technique;Verbal cues for precautions/safety;Manual facilitation for placement Locomotion  Ambulation Ambulation: Yes Ambulation/Gait Assistance: 3: Mod assist Ambulation Distance (Feet): 120 Feet Assistive device: Rolling walker Ambulation/Gait Assistance Details: Verbal cues for sequencing;Verbal cues for technique;Verbal cues for precautions/safety;Manual facilitation for weight shifting;Visual cues/gestures for precautions/safety Gait Gait: Yes Gait Pattern: Impaired Gait Pattern: Step-through pattern;Poor foot clearance - right;Poor foot clearance - left;Wide base of support;Trunk flexed;Decreased step length - left;Decreased step length - right Stairs / Additional Locomotion Stairs: Yes Stairs Assistance: 3: Mod assist Stairs Assistance Details: Verbal cues for sequencing;Verbal cues for technique;Verbal cues for precautions/safety;Tactile cues for weight shifting;Tactile cues for posture;Verbal cues for gait pattern;Verbal cues for safe use of DME/AE Stair Management Technique: Two rails Number of Stairs: 4 Height of Stairs:  6 Curb: 3: Mod Chemical engineer: Yes Wheelchair Assistance: 5: Careers information officer: Both upper extremities Wheelchair Parts Management: Needs assistance Distance: 125  Trunk/Postural Assessment  Cervical Assessment Cervical Assessment: Within Functional Limits Thoracic Assessment Thoracic Assessment: Within Functional Limits Lumbar Assessment Lumbar Assessment: Exceptions to [facility] Lumbar AROM Overall Lumbar AROM Comments: flat back posture and signifcant pitting edema  Postural Control Postural Control: Deficits on evaluation Righting Reactions: Patient demonstrated poor righting reactions with gait and sitting balance required mod A to prevent LOB in sitting and Max A to prevent LOB in standing.    Balance  Patient performed Berg balance test: 23/56. See flow sheet for details.   Balance Balance Assessed: Yes Static Sitting Balance Static Sitting - Comment/# of Minutes: Required increased time  to find Balance, able to maintain without UE support. Dynamic Sitting Balance Sitting balance - Comments: Required at least 1 UE supported to maintain balnace, Mod A to prevent LOB with reaching and no UE support.  Static Standing Balance Static Standing - Comment/# of Minutes: mod A without A. Supervision A- min A with AD.   Dynamic Standing Balance Dynamic Standing - Comments: Required BUE support to maintain dynamic balance.   Extremity Assessment      RLE Assessment RLE Assessment: Exceptions to [facility] RLE AROM (degrees) RLE Overall AROM Comments: decreased hip movement in all directions due to body habitus from LE/abdominal edema.  RLE Strength RLE Overall Strength Comments: 4+/5 to 5/5 within available range LLE Assessment LLE Assessment: Exceptions to [facility] LLE AROM (degrees) LLE Overall AROM Comments: decreased hip movement in all directions due to body habitus from LE/abdominal edema. LLE Strength LLE Overall Strength Comments: 4+/5  to 5/5 within available range.    See Function Navigator for Current Functional Status.   Refer to Care Plan for Long Term Goals  Recommendations for other services: None  Discharge Criteria: Patient will be discharged from PT if patient refuses treatment 3 consecutive times without medical reason, if treatment goals not met, if there is a change in medical status, if patient makes no progress towards goals or if patient is discharged from hospital.  The above assessment, treatment plan, treatment alternatives and goals were discussed and mutually agreed upon: by patient  Lorie Phenix 11/14/2015, 1:03 PM

## 2015-11-15 LAB — GLUCOSE, CAPILLARY
GLUCOSE-CAPILLARY: 81 mg/dL (ref 65–99)
GLUCOSE-CAPILLARY: 105 mg/dL — AB (ref 65–99)
GLUCOSE-CAPILLARY: 111 mg/dL — AB (ref 65–99)
Glucose-Capillary: 113 mg/dL — ABNORMAL HIGH (ref 65–99)

## 2015-11-15 MED ORDER — LIDOCAINE HCL 2 % EX GEL
1.0000 "application " | Freq: Four times a day (QID) | CUTANEOUS | Status: DC | PRN
  Administered 2015-11-18: 1 via URETHRAL
  Filled 2015-11-15 (×3): qty 5

## 2015-11-15 MED ORDER — FUROSEMIDE 40 MG PO TABS
60.0000 mg | ORAL_TABLET | Freq: Two times a day (BID) | ORAL | Status: DC
  Administered 2015-11-15 – 2015-11-17 (×4): 60 mg via ORAL
  Filled 2015-11-15 (×5): qty 1

## 2015-11-15 NOTE — Progress Notes (Signed)
55 y.o. right handed male with history of alcohol abuse with associated liver disease, hypertension, OSA (untreated). Per chart review patient lives alone but has supportive a supportive brother. Reported to be independent prior to admission. Presented 10/31/2015 with increasing shortness of breath required intubation ED for airway protection became obtunded. Developed hypotension. Stat CBC with hemoglobin 2.7 and no obvious source of bleeding, lactic acid 14, creatinine 2.51, INR 4.35. CT of the head negative for acute changes. Chest x-ray with extensive atelectasis right middle lobe and lower lobes.EKG sinus tachycardia with SVT versus atrial fibrillation placed on amiodarone drip and transition to 200 mg by mouth daily with rate controlled. Echocardiogram with ejection fraction of 55% no wall motion abnormalities. Ultrasound abdomen showed cirrhosis. No liver mass detected. Moderate volume ascites. Follow-up CT abdomen and pelvis showed a large hematoma left upper thigh. Patient was transfused. Maintained on broad-spectrum antibiotics. Hemoglobin stabilized at 7.4 after 6 units packed red blood cells. Underwent paracentesis with 3 L removed 11/02/2015. Gastroenterology follow-up as well as hematology for anemia question DIC related to cirrhosis. No current plan for EGD. Creatinine stabilized with gentle IV fluids 1.44 as well as hemoglobin 8.6  Subjective/Complaints:  Bladder scan showing greater than 999 mL however patient has severe ascites. When catheterized there is a small amount of urine.  You systems negative for chest pain, shortness of breath, nausea, vomiting, diarrhea Objective: Vital Signs: Blood pressure 132/87, pulse 94, temperature 98.5 F (36.9 C), temperature source Oral, resp. rate 20, height 6' (1.829 m), weight 105.6 kg (232 lb 12.9 oz), SpO2 100 %. No results found. Results for orders placed or performed during the hospital encounter of 11/13/15 (from the past 72 hour(s))   Glucose, capillary     Status: Abnormal   Collection Time: 11/13/15  5:57 PM  Result Value Ref Range   Glucose-Capillary 106 (H) 65 - 99 mg/dL  Glucose, capillary     Status: Abnormal   Collection Time: 11/13/15  9:58 PM  Result Value Ref Range   Glucose-Capillary 102 (H) 65 - 99 mg/dL   Comment 1 Notify RN   Glucose, capillary     Status: None   Collection Time: 11/14/15  6:40 AM  Result Value Ref Range   Glucose-Capillary 91 65 - 99 mg/dL   Comment 1 Notify RN   Glucose, capillary     Status: None   Collection Time: 11/14/15 12:05 PM  Result Value Ref Range   Glucose-Capillary 98 65 - 99 mg/dL  Glucose, capillary     Status: Abnormal   Collection Time: 11/14/15  5:33 PM  Result Value Ref Range   Glucose-Capillary 114 (H) 65 - 99 mg/dL  Glucose, capillary     Status: Abnormal   Collection Time: 11/14/15  8:40 PM  Result Value Ref Range   Glucose-Capillary 119 (H) 65 - 99 mg/dL  Glucose, capillary     Status: None   Collection Time: 11/15/15  6:37 AM  Result Value Ref Range   Glucose-Capillary 81 65 - 99 mg/dL   Comment 1 Notify RN      HEENT: icteric Cardio: RRR and no murmurs Resp: CTA B/L and unlabored GI: Distention and ascites Extremity:  Edema edema in both thighs but minimal edema in the feet Skin:   Other jaundice Neuro: Alert/Oriented, Normal Sensory and Abnormal Motor 4/5 bilateral deltoid, biceps, triceps, grip, 2 minus hip flexor knee extensor 3 minus ankle dorsiflexor and plantar flexor Musc/Skel:  Normal Gen. no acute distress   Assessment/Plan: 1.  Functional deficits secondary to Debilitation secondary to respiratory failure/sepsis, complications from cirrhosis of liver/ascites. which require 3+ hours per day of interdisciplinary therapy in a comprehensive inpatient rehab setting. Physiatrist is providing close team supervision and 24 hour management of active medical problems listed below. Physiatrist and rehab team continue to assess barriers to  discharge/monitor patient progress toward functional and medical goals. FIM: Function - Bathing Position: Wheelchair/chair at sink  Function- Upper Body Dressing/Undressing What is the patient wearing?: Pull over shirt/dress Pull over shirt/dress - Perfomed by patient: Thread/unthread right sleeve, Thread/unthread left sleeve, Put head through opening Pull over shirt/dress - Perfomed by helper: Pull shirt over trunk Assist Level: Touching or steadying assistance(Pt > 75%) Function - Lower Body Dressing/Undressing What is the patient wearing?: Pants, Underwear, Non-skid slipper socks, Shoes Position: Wheelchair/chair at sink Underwear - Performed by helper: Thread/unthread right underwear leg, Thread/unthread left underwear leg, Pull underwear up/down Pants- Performed by helper: Pull pants up/down, Thread/unthread left pants leg, Thread/unthread right pants leg Non-skid slipper socks- Performed by helper: Don/doff right sock, Don/doff left sock Shoes - Performed by helper: Don/doff right shoe, Don/doff left shoe, Fasten right, Fasten left Assist for footwear: Maximal assist Assist for lower body dressing: Touching or steadying assistance (Pt > 75%)  Function - Toileting Toileting activity did not occur: N/A  Function - Archivist transfer activity did not occur: N/A  Function - Chair/bed transfer Chair/bed transfer activity did not occur: N/A Chair/bed transfer method: Stand pivot Chair/bed transfer assist level: Moderate assist (Pt 50 - 74%/lift or lower) Chair/bed transfer assistive device: Armrests, Walker Chair/bed transfer details: Tactile cues for sequencing, Tactile cues for weight shifting, Tactile cues for posture, Verbal cues for technique, Verbal cues for sequencing, Verbal cues for precautions/safety, Verbal cues for safe use of DME/AE, Visual cues for safe use of DME/AE  Function - Locomotion: Wheelchair Will patient use wheelchair at discharge?: Yes Type:  Manual Max wheelchair distance: 125 Assist Level: Supervision or verbal cues Assist Level: Supervision or verbal cues Wheel 150 feet activity did not occur: Safety/medical concerns Turns around,maneuvers to table,bed, and toilet,negotiates 3% grade,maneuvers on rugs and over doorsills: No Function - Locomotion: Ambulation Assistive device: Walker-rolling Max distance: 120 Assist level: Moderate assist (Pt 50 - 74%) Assist level: Moderate assist (Pt 50 - 74%) Assist level: Moderate assist (Pt 50 - 74%) Walk 150 feet activity did not occur: Safety/medical concerns Assist level: Moderate assist (Pt 50 - 74%)  Function - Comprehension Comprehension: Auditory Comprehension assist level: Understands basic 75 - 89% of the time/ requires cueing 10 - 24% of the time  Function - Expression Expression: Verbal Expression assist level: Expresses basic 75 - 89% of the time/requires cueing 10 - 24% of the time. Needs helper to occlude trach/needs to repeat words.  Function - Social Interaction Social Interaction assist level: Interacts appropriately 75 - 89% of the time - Needs redirection for appropriate language or to initiate interaction.  Function - Problem Solving Problem solving assist level: Solves basic 75 - 89% of the time/requires cueing 10 - 24% of the time  Function - Memory Memory assist level: Recognizes or recalls 50 - 74% of the time/requires cueing 25 - 49% of the time Patient normally able to recall (first 3 days only): Current season, Location of own room, Staff names and faces, That he or she is in a hospital    Medical Problem List and Plan: 1.  Debilitation secondary to respiratory failure/sepsis, complications from cirrhosis of liver/ascites. Status post paracentesis  11/02/2015             -Initiate rehabilitation therapies today 2.  DVT Prophylaxis/Anticoagulation: SCDs. Monitor for any signs of DVT 3. Pain Management: Tylenol as needed 4. Anemia/thrombocytopenia  question DIC related to cirrhosis. Status post transfused multiple units. Follow-up CBC CBC Latest Ref Rng 11/12/2015 11/11/2015 11/10/2015  WBC 4.0 - 10.5 K/uL 13.3(H) 13.5(H) 13.6(H)  Hemoglobin 13.0 - 17.0 g/dL 1.6(X) 0.9(U) 8.3(L)  Hematocrit 39.0 - 52.0 % 26.3(L) 25.4(L) 25.0(L)  Platelets 150 - 400 K/uL 149(L) 141(L) 142(L)    5. Neuropsych: This patient is capable of making decisions on his own behalf. 6. Skin/Wound Care: Routine skin checks 7. Fluids/Electrolytes/Nutrition: Routine I&O's with follow-up chemistries 8. Hypertension. No current antihypertensive medication. Follow with increased mobility 9. Acute renal insufficiency. Resolving. Follow-up chemistries BMP Latest Ref Rng 11/13/2015 11/12/2015 11/11/2015  Glucose 65 - 99 mg/dL 045(W) 098(J) 191(Y)  BUN 6 - 20 mg/dL 78(G) 95(A) 21(H)  Creatinine 0.61 - 1.24 mg/dL 0.86(V) 7.84(O) 9.62(X)  Sodium 135 - 145 mmol/L 138 138 139  Potassium 3.5 - 5.1 mmol/L 3.2(L) 3.4(L) 3.4(L)  Chloride 101 - 111 mmol/L 104 105 104  CO2 22 - 32 mmol/L Calcium 8.9 - 10.3 mg/dL 7.8(L) 7.8(L) 7.8(L)    10. Sinus tachycardia with SVT versus atrial fibrillation. Amiodarone 200 mg daily. Filed Vitals:   11/14/15 1500 11/15/15 0427  BP: 125/85 132/87  Pulse: 85 94  Temp: 98.6 F (37 C) 98.5 F (36.9 C)  Resp: 18 20   11. Liver failure with ascites will likely need repeat paracentesis this week, Poor urine output, third spacing will increase lasix to BID and monitor renal staus  LOS (Days) 2 A FACE TO FACE EVALUATION WAS PERFORMED  Kyleen Villatoro E 11/15/2015, 11:21 AM

## 2015-11-15 NOTE — Progress Notes (Signed)
Discussed use of aromatherapy to reduce scrotal edema with this patient, he stated that he was willing to try if there was a chance it would help. Will use lavender oil (3 drops in 30ml sterile water) soaked 4x4s wrapped around his penis and scrotum.

## 2015-11-16 ENCOUNTER — Inpatient Hospital Stay (HOSPITAL_COMMUNITY): Payer: Federal, State, Local not specified - PPO

## 2015-11-16 ENCOUNTER — Inpatient Hospital Stay (HOSPITAL_COMMUNITY): Payer: Federal, State, Local not specified - PPO | Admitting: Physical Therapy

## 2015-11-16 LAB — CBC WITH DIFFERENTIAL/PLATELET
BASOS ABS: 0.2 10*3/uL — AB (ref 0.0–0.1)
Basophils Relative: 2 %
EOS ABS: 0.1 10*3/uL (ref 0.0–0.7)
Eosinophils Relative: 1 %
HEMATOCRIT: 27.6 % — AB (ref 39.0–52.0)
HEMOGLOBIN: 9.1 g/dL — AB (ref 13.0–17.0)
LYMPHS PCT: 8 %
Lymphs Abs: 0.9 10*3/uL (ref 0.7–4.0)
MCH: 30.3 pg (ref 26.0–34.0)
MCHC: 33 g/dL (ref 30.0–36.0)
MCV: 92 fL (ref 78.0–100.0)
MONOS PCT: 9 %
Monocytes Absolute: 1 10*3/uL (ref 0.1–1.0)
NEUTROS ABS: 9.3 10*3/uL — AB (ref 1.7–7.7)
NEUTROS PCT: 80 %
PLATELETS: 173 10*3/uL (ref 150–400)
RBC: 3 MIL/uL — AB (ref 4.22–5.81)
RDW: 21.2 % — AB (ref 11.5–15.5)
WBC: 11.5 10*3/uL — AB (ref 4.0–10.5)

## 2015-11-16 LAB — COMPREHENSIVE METABOLIC PANEL
ALT: 35 U/L (ref 17–63)
AST: 62 U/L — ABNORMAL HIGH (ref 15–41)
Albumin: 2.1 g/dL — ABNORMAL LOW (ref 3.5–5.0)
Alkaline Phosphatase: 84 U/L (ref 38–126)
Anion gap: 11 (ref 5–15)
BUN: 25 mg/dL — AB (ref 6–20)
CHLORIDE: 103 mmol/L (ref 101–111)
CO2: 23 mmol/L (ref 22–32)
CREATININE: 1.34 mg/dL — AB (ref 0.61–1.24)
Calcium: 8.3 mg/dL — ABNORMAL LOW (ref 8.9–10.3)
GFR, EST NON AFRICAN AMERICAN: 59 mL/min — AB (ref >60)
Glucose, Bld: 114 mg/dL — ABNORMAL HIGH (ref 65–99)
POTASSIUM: 4.1 mmol/L (ref 3.5–5.1)
Sodium: 137 mmol/L (ref 135–145)
Total Bilirubin: 13.4 mg/dL — ABNORMAL HIGH (ref 0.3–1.2)
Total Protein: 7.6 g/dL (ref 6.5–8.1)

## 2015-11-16 LAB — C DIFFICILE QUICK SCREEN W PCR REFLEX
C DIFFICILE (CDIFF) INTERP: NEGATIVE
C Diff antigen: NEGATIVE
C Diff toxin: NEGATIVE

## 2015-11-16 LAB — GLUCOSE, CAPILLARY
GLUCOSE-CAPILLARY: 102 mg/dL — AB (ref 65–99)
GLUCOSE-CAPILLARY: 114 mg/dL — AB (ref 65–99)
GLUCOSE-CAPILLARY: 111 mg/dL — AB (ref 65–99)

## 2015-11-16 LAB — AMMONIA: Ammonia: 34 umol/L (ref 9–35)

## 2015-11-16 MED ORDER — LIDOCAINE HCL (PF) 1 % IJ SOLN
INTRAMUSCULAR | Status: AC
Start: 2015-11-16 — End: 2015-11-16
  Filled 2015-11-16: qty 10

## 2015-11-16 NOTE — Progress Notes (Signed)
Occupational Therapy Note  Patient Details  Name: [name] MRN: [id number] Date of Birth: 04/10/1961  Today's Date: 11/16/2015 OT Missed Time: 75 Minutes Missed Time Reason: Unavailable (comment) (off unit for paracentesis)  Pt missed 75 mins skilled OT services.  Pt off unit for paracentesis.   [name], Jesus [facility]appell 11/16/2015, 11:41 AM

## 2015-11-16 NOTE — Progress Notes (Signed)
Patient information reviewed and entered into eRehab system by Laura-Lee Villegas, RN, CRRN, PPS Coordinator.  Information including medical coding and functional independence measure will be reviewed and updated through discharge.    

## 2015-11-16 NOTE — Progress Notes (Signed)
The puncture site for this patient's thoracentesis has continued to leak since his return to the floor, the dressing has been changed 4 times due to the drainage soaking thru the dressing and wetting the bed. A urostomy bag was placed over the site in an effort to keep the patient dry, this seems to be working at this time. The patient experiences pain any time his foreskin is retracted to do catheter care.  Lidocaine gel should be applied to the entire foreskin abut 5 minutes prior to catheter care. This will have to be done by a nurse not a tech.

## 2015-11-16 NOTE — Progress Notes (Signed)
Pt complaining of an upset stomach. Pt has had 4 loose stools this shift, with nausea. Zofran administered, but pt requesting something for his bowels. Dr. Wynn BankerKirsteins notified and ordered to obtain a C. Diff sample. Will continue to monitor. Rudie MeyerEurillo, [place] A, RN

## 2015-11-16 NOTE — Progress Notes (Signed)
Spoke with Dr Grace IsaacWatts regarding the continued draining from the paracentesis site, he agreed with use of the urostomy bag and suggestted the patient be put in for another paracentesis tomorrow if the drainage continues. This informatiopn ws relayed to Marissa NestlePam Love, P.A., who is on call for inpt rehab.

## 2015-11-16 NOTE — Plan of Care (Signed)
Problem: RH PAIN MANAGEMENT Goal: RH STG PAIN MANAGED AT OR BELOW PT'S PAIN GOAL <3 on a 0-10 scale  Outcome: Not Progressing Pt rates pain 8/10 but refuses PRN medication.

## 2015-11-16 NOTE — Progress Notes (Signed)
Subjective/Complaints: Frequent low output urination. Stools frequent/soft/loose. Pt with substantial nausea since last evening and having abdominal pain also  Poor appetite .denies chest pain, sob. Didn't sleep well   Objective: Vital Signs: Blood pressure 130/89, pulse 88, temperature 98.2 F (36.8 C), temperature source Oral, resp. rate 20, height 6' (1.829 m), weight 109.7 kg (241 lb 13.5 oz), SpO2 100 %. No results found. Results for orders placed or performed during the hospital encounter of 11/13/15 (from the past 72 hour(s))  Glucose, capillary     Status: Abnormal   Collection Time: 11/13/15  5:57 PM  Result Value Ref Range   Glucose-Capillary 106 (H) 65 - 99 mg/dL  Glucose, capillary     Status: Abnormal   Collection Time: 11/13/15  9:58 PM  Result Value Ref Range   Glucose-Capillary 102 (H) 65 - 99 mg/dL   Comment 1 Notify RN   Glucose, capillary     Status: None   Collection Time: 11/14/15  6:40 AM  Result Value Ref Range   Glucose-Capillary 91 65 - 99 mg/dL   Comment 1 Notify RN   Glucose, capillary     Status: None   Collection Time: 11/14/15 12:05 PM  Result Value Ref Range   Glucose-Capillary 98 65 - 99 mg/dL  Glucose, capillary     Status: Abnormal   Collection Time: 11/14/15  5:33 PM  Result Value Ref Range   Glucose-Capillary 114 (H) 65 - 99 mg/dL  Glucose, capillary     Status: Abnormal   Collection Time: 11/14/15  8:40 PM  Result Value Ref Range   Glucose-Capillary 119 (H) 65 - 99 mg/dL  Glucose, capillary     Status: None   Collection Time: 11/15/15  6:37 AM  Result Value Ref Range   Glucose-Capillary 81 65 - 99 mg/dL   Comment 1 Notify RN   Glucose, capillary     Status: Abnormal   Collection Time: 11/15/15 12:10 PM  Result Value Ref Range   Glucose-Capillary 113 (H) 65 - 99 mg/dL  Glucose, capillary     Status: Abnormal   Collection Time: 11/15/15  4:43 PM  Result Value Ref Range   Glucose-Capillary 105 (H) 65 - 99 mg/dL  Glucose, capillary      Status: Abnormal   Collection Time: 11/15/15  8:32 PM  Result Value Ref Range   Glucose-Capillary 111 (H) 65 - 99 mg/dL  CBC WITH DIFFERENTIAL     Status: Abnormal   Collection Time: 11/16/15  5:46 AM  Result Value Ref Range   WBC 11.5 (H) 4.0 - 10.5 K/uL   RBC 3.00 (L) 4.22 - 5.81 MIL/uL   Hemoglobin 9.1 (L) 13.0 - 17.0 g/dL   HCT 27.6 (L) 39.0 - 52.0 %   MCV 92.0 78.0 - 100.0 fL   MCH 30.3 26.0 - 34.0 pg   MCHC 33.0 30.0 - 36.0 g/dL   RDW 21.2 (H) 11.5 - 15.5 %   Platelets 173 150 - 400 K/uL   Neutrophils Relative % 80 %   Lymphocytes Relative 8 %   Monocytes Relative 9 %   Eosinophils Relative 1 %   Basophils Relative 2 %   Neutro Abs 9.3 (H) 1.7 - 7.7 K/uL   Lymphs Abs 0.9 0.7 - 4.0 K/uL   Monocytes Absolute 1.0 0.1 - 1.0 K/uL   Eosinophils Absolute 0.1 0.0 - 0.7 K/uL   Basophils Absolute 0.2 (H) 0.0 - 0.1 K/uL   RBC Morphology TARGET CELLS     Comment:  ACANTHOCYTES POLYCHROMASIA PRESENT TEARDROP CELLS   Comprehensive metabolic panel     Status: Abnormal   Collection Time: 11/16/15  5:46 AM  Result Value Ref Range   Sodium 137 135 - 145 mmol/L   Potassium 4.1 3.5 - 5.1 mmol/L   Chloride 103 101 - 111 mmol/L   CO2 23 22 - 32 mmol/L   Glucose, Bld 114 (H) 65 - 99 mg/dL   BUN 25 (H) 6 - 20 mg/dL   Creatinine, Ser 1.34 (H) 0.61 - 1.24 mg/dL   Calcium 8.3 (L) 8.9 - 10.3 mg/dL   Total Protein 7.6 6.5 - 8.1 g/dL   Albumin 2.1 (L) 3.5 - 5.0 g/dL   AST 62 (H) 15 - 41 U/L   ALT 35 17 - 63 U/L   Alkaline Phosphatase 84 38 - 126 U/L   Total Bilirubin 13.4 (H) 0.3 - 1.2 mg/dL   GFR calc non Af Amer 59 (L) >60 mL/min   GFR calc Af Amer >60 >60 mL/min    Comment: (NOTE) The eGFR has been calculated using the CKD EPI equation. This calculation has not been validated in all clinical situations. eGFR's persistently <60 mL/min signify possible Chronic Kidney Disease.    Anion gap 11 5 - 15  Glucose, capillary     Status: Abnormal   Collection Time: 11/16/15  6:43 AM   Result Value Ref Range   Glucose-Capillary 102 (H) 65 - 99 mg/dL     HEENT: icteric Cardio: RRR and no murmurs Resp: CTA B/L and unlabored GI: Distention and ascites, abdomen very tight and painful to palpation Extremity:  Edema edema in both thighs but minimal edema in the feet Skin:   Other jaundice Neuro: Alert/Oriented, Normal Sensory and Abnormal Motor 4/5 bilateral deltoid, biceps, triceps, grip, 2 minus hip flexor knee extensor 3 minus ankle dorsiflexor and plantar flexor Musc/Skel:  Normal Gen. no acute distress   Assessment/Plan: 1. Functional deficits secondary to Debilitation secondary to respiratory failure/sepsis, complications from cirrhosis of liver/ascites. which require 3+ hours per day of interdisciplinary therapy in a comprehensive inpatient rehab setting. Physiatrist is providing close team supervision and 24 hour management of active medical problems listed below. Physiatrist and rehab team continue to assess barriers to discharge/monitor patient progress toward functional and medical goals. FIM: Function - Bathing Position: Wheelchair/chair at sink  Function- Upper Body Dressing/Undressing What is the patient wearing?: Pull over shirt/dress Pull over shirt/dress - Perfomed by patient: Thread/unthread right sleeve, Thread/unthread left sleeve, Put head through opening Pull over shirt/dress - Perfomed by helper: Pull shirt over trunk Assist Level: Touching or steadying assistance(Pt > 75%) Function - Lower Body Dressing/Undressing What is the patient wearing?: Pants, Underwear, Non-skid slipper socks, Shoes Position: Wheelchair/chair at sink Underwear - Performed by helper: Thread/unthread right underwear leg, Thread/unthread left underwear leg, Pull underwear up/down Pants- Performed by helper: Pull pants up/down, Thread/unthread left pants leg, Thread/unthread right pants leg Non-skid slipper socks- Performed by helper: Don/doff right sock, Don/doff left  sock Shoes - Performed by helper: Don/doff right shoe, Don/doff left shoe, Fasten right, Fasten left Assist for footwear: Maximal assist Assist for lower body dressing: Touching or steadying assistance (Pt > 75%)  Function - Toileting Toileting activity did not occur: N/A  Function - Air cabin crew transfer activity did not occur: N/A  Function - Chair/bed transfer Chair/bed transfer activity did not occur: N/A Chair/bed transfer method: Stand pivot Chair/bed transfer assist level: Moderate assist (Pt 50 - 74%/lift or lower) Chair/bed transfer assistive device:  Armrests, Walker Chair/bed transfer details: Tactile cues for sequencing, Tactile cues for weight shifting, Tactile cues for posture, Verbal cues for technique, Verbal cues for sequencing, Verbal cues for precautions/safety, Verbal cues for safe use of DME/AE, Visual cues for safe use of DME/AE  Function - Locomotion: Wheelchair Will patient use wheelchair at discharge?: Yes Type: Manual Max wheelchair distance: 125 Assist Level: Supervision or verbal cues Assist Level: Supervision or verbal cues Wheel 150 feet activity did not occur: Safety/medical concerns Turns around,maneuvers to table,bed, and toilet,negotiates 3% grade,maneuvers on rugs and over doorsills: No Function - Locomotion: Ambulation Assistive device: Walker-rolling Max distance: 120 Assist level: Moderate assist (Pt 50 - 74%) Assist level: Moderate assist (Pt 50 - 74%) Assist level: Moderate assist (Pt 50 - 74%) Walk 150 feet activity did not occur: Safety/medical concerns Assist level: Moderate assist (Pt 50 - 74%)  Function - Comprehension Comprehension: Auditory Comprehension assist level: Understands basic 75 - 89% of the time/ requires cueing 10 - 24% of the time  Function - Expression Expression: Verbal Expression assist level: Expresses basic 75 - 89% of the time/requires cueing 10 - 24% of the time. Needs helper to occlude trach/needs  to repeat words.  Function - Social Interaction Social Interaction assist level: Interacts appropriately 75 - 89% of the time - Needs redirection for appropriate language or to initiate interaction.  Function - Problem Solving Problem solving assist level: Solves basic 75 - 89% of the time/requires cueing 10 - 24% of the time  Function - Memory Memory assist level: Recognizes or recalls 50 - 74% of the time/requires cueing 25 - 49% of the time Patient normally able to recall (first 3 days only): Current season, Location of own room, Staff names and faces, That he or she is in a hospital    Medical Problem List and Plan: 1.  Debilitation secondary to respiratory failure/sepsis, complications from cirrhosis of liver/ascites. Status post paracentesis 11/02/2015             -continue therapies as tolerated 2.  DVT Prophylaxis/Anticoagulation: SCDs. Monitor for any signs of DVT 3. Pain Management: Tylenol as needed 4. Anemia/thrombocytopenia question DIC related to cirrhosis. Status post transfused multiple units. Follow-up CBC CBC Latest Ref Rng 11/16/2015 11/12/2015 11/11/2015  WBC 4.0 - 10.5 K/uL 11.5(H) 13.3(H) 13.5(H)  Hemoglobin 13.0 - 17.0 g/dL 9.1(L) 8.6(L) 8.6(L)  Hematocrit 39.0 - 52.0 % 27.6(L) 26.3(L) 25.4(L)  Platelets 150 - 400 K/uL 173 149(L) 141(L)    5. Neuropsych: This patient is capable of making decisions on his own behalf. 6. Skin/Wound Care: Routine skin checks 7. Fluids/Electrolytes/Nutrition: encourage po as possible 8. Hypertension. No current antihypertensive medication. Follow with increased mobility 9. Acute renal insufficiency. Resolving. Follow-up chemistries essentially stable BMP Latest Ref Rng 11/16/2015 11/13/2015 11/12/2015  Glucose 65 - 99 mg/dL 114(H) 128(H) 101(H)  BUN 6 - 20 mg/dL 25(H) 28(H) 30(H)  Creatinine 0.61 - 1.24 mg/dL 1.34(H) 1.36(H) 1.44(H)  Sodium 135 - 145 mmol/L 137 138 138  Potassium 3.5 - 5.1 mmol/L 4.1 3.2(L) 3.4(L)  Chloride 101 - 111  mmol/L 103 104 105  CO2 22 - 32 mmol/L 23 25 24   Calcium 8.9 - 10.3 mg/dL 8.3(L) 7.8(L) 7.8(L)    10. Sinus tachycardia with SVT versus atrial fibrillation. Amiodarone 200 mg daily.  11. Liver failure with ascites: will need repeat paracentesis this week---contact GI   Poor urine output, third spacing. lasix increased to BID   -monitor renal staus  -LFT's fairly stable, re-check ammonia level  LOS (Days) 3 A FACE TO FACE EVALUATION WAS PERFORMED  Charlena Haub T 11/16/2015, 8:56 AM

## 2015-11-16 NOTE — Progress Notes (Signed)
Occupational Therapy Session Note  Patient Details  Name: Teola Bradleyhomas Mariscal MRN: [id number] Date of Birth: 03/31/1961  Today's Date: 11/16/2015 OT Individual Time: 1330-1430 OT Individual Time Calculation (min): 60 min    Short Term Goals: Week 1:  OT Short Term Goal 1 (Week 1): STG= LTG  Skilled Therapeutic Interventions/Progress Updates:    Pt resting in bed with eyes closed upon arrival.  Pt agreeable to participating in therapy.  Pt engaged in bed mobility, sit<>stand, and standing balance tasks.  Pt declined bathing this afternoon but agreed to bathing and donning clothing tomorrow morning.  Pt stated that he was feeling much better since paracentesis procedure in the morning.  Pt stood with supervision at side of bed to allow therapist to change linens.  Pt procedure site continues to ooze and pt's bed was soiled.  RN aware.  Discussed OT LTGs and POC with patient.  Pt pleased with goals and looking forward to returning home.    Therapy Documentation Precautions:  Precautions Precautions: Fall Restrictions Weight Bearing Restrictions: No Pain:  Pt denied pain  See Function Navigator for Current Functional Status.   Therapy/Group: Individual Therapy  Rich BraveLanier, Maliek Chappell 11/16/2015, 2:45 PM

## 2015-11-16 NOTE — Progress Notes (Signed)
Physical Therapy Session Note  Patient Details  Name: [name] MRN: [id number] Date of Birth: 11/04/1960  Today's Date: 11/16/2015 PT Individual Time: 0900-0953 PT Individual Time Calculation (min): 53 min   Short Term Goals: Week 1:  PT Short Term Goal 1 (Week 1): Patient with perform sit<>stand with min A and LRAD.  PT Short Term Goal 2 (Week 1): Patient will ambulate with RW and min A for 150.  PT Short Term Goal 3 (Week 1): Patient will propell WC 1450ft at mod I level in controlled environment.   Skilled Therapeutic Interventions/Progress Updates:   Patient semi reclined in bed, initially refusing due to having "a rough night" due to frequent bowel movements and lack of sleep but agreeable to participate with min encouragement and education on goals and purpose of therapy. Patient declined ambulation/leaving room. With increased time, patient sat edge of bed with HOB raised and heavy dependence on B rails to scoot to edge of bed. Patient required mod A overall from raised bed using bed rail and RW with verbal cues for hand placement and technique for sit > stand transfer x 2. Patient stood x 4 min using RW with marching x 10 + 15 reps and taking a few steps forward and backward from edge of bed with min A. Patient c/o RLE pain, RN notified and administered medication. Patient sat edge of bed to take meds and get blood drawn and performed seated heel raises x 10 and LAQ x 10 each LE. Patient able to stand with supervision x 1 min 45 sec before sitting and declined further activities. [facility] with good bottom clearance and transferred sit > supine using rail with supervision. Patient left semi reclined in bed with needs in reach and brother in room.   Therapy Documentation Precautions:  Precautions Precautions: Fall Restrictions Weight Bearing Restrictions: No General: PT Amount of Missed Time (min): 7 Minutes PT Missed Treatment Reason: Patient fatigue Pain: Pain  Assessment Pain Assessment: 0-10 Pain Score: 8  Pain Type: Acute pain Pain Location: Leg Pain Orientation: Right Pain Descriptors / Indicators: Aching;Throbbing Pain Onset: With Activity Pain Intervention(s): Repositioned;Rest;RN made aware   See Function Navigator for Current Functional Status.   Therapy/Group: Individual Therapy  [name], [name] 11/16/2015, 9:53 AM

## 2015-11-16 NOTE — IPOC Note (Signed)
Overall Plan of Care [facility]) Patient Details Name: [name] MRN: [id number] DOB: February 05, 1961  Admitting Diagnosis: Debility  Hospital Problems: Principal Problem:   Debilitated Active Problems:   Alcoholic cirrhosis of liver with ascites (HCC)   Bilateral lower extremity edema     Functional Problem List: Nursing Bladder, Bowel, Edema, Endurance, Medication Management, Pain, Safety, Sensory, Skin Integrity  PT Balance, Edema, Endurance, Motor, Safety, Sensory  OT Balance, Edema, Endurance, Motor, Safety, Pain  SLP    TR         Basic ADL's: OT Grooming, Bathing, Dressing, Toileting     Advanced  ADL's: OT Simple Meal Preparation, Laundry, Light Housekeeping     Transfers: PT Bed Mobility, Bed to Chair, Car, [employer], Floor  OT Toilet (Pt does not shower)     Locomotion: PT Ambulation, Wheelchair Mobility, Stairs     Additional Impairments: OT None  SLP        TR      Anticipated Outcomes Item Anticipated Outcome  Self Feeding independent  Swallowing      Basic self-care  mod I  Toileting  mod I   Bathroom Transfers mod I to toilet  Bowel/Bladder  Mod I  Transfers  Mod I with LRAD   Locomotion  Supervision  to mod I with LRAD   Communication     Cognition     Pain  <3  Safety/Judgment  Mod I   Therapy Plan: PT Intensity: Minimum of 1-2 x/day ,45 to 90 minutes PT Frequency: 5 out of 7 days PT Duration Estimated Length of Stay: 7-14 days.  OT Intensity: Minimum of 1-2 x/day, 45 to 90 minutes OT Frequency: 5 out of 7 days OT Duration/Estimated Length of Stay: 7-9 days         Team Interventions: Nursing Interventions Patient/Family Education, Bladder Management, Bowel Management, Disease Management/Prevention, Pain Management, Medication Management, Skin Care/Wound Management, Discharge Planning, Psychosocial Support  PT interventions Ambulation/gait training, Discharge planning, Functional mobility training, Psychosocial support,  Therapeutic Activities, Visual/perceptual remediation/compensation, Balance/vestibular training, Disease management/prevention, Neuromuscular re-education, Skin care/wound management, Therapeutic Exercise, Wheelchair propulsion/positioning, Cognitive remediation/compensation, DME/adaptive equipment instruction, Pain management, UE/LE Strength taining/ROM, Community reintegration, Equities trader education, Museum/gallery curator, UE/LE Coordination activities  OT Interventions Warden/ranger, Discharge planning, DME/adaptive equipment instruction, Functional mobility training, Pain management, Patient/family education, Self Care/advanced ADL retraining, Therapeutic Activities, Therapeutic Exercise, UE/LE Strength taining/ROM, UE/LE Coordination activities  SLP Interventions    TR Interventions    SW/CM Interventions Discharge Planning, Psychosocial Support, Patient/Family Education    Team Discharge Planning: Destination: PT-Home ,OT- Home , SLP-  Projected Follow-up: PT-Home health PT, OT-  Home health OT, SLP-  Projected Equipment Needs: PT-Wheelchair (measurements), Wheelchair cushion (measurements), Rolling walker with 5" wheels, To be determined, OT- 3 in 1 bedside comode, SLP-  Equipment Details: PT- , OT-  Patient/family involved in discharge planning: PT- Patient,  OT-Patient, SLP-   MD ELOS: 11-15d Medical Rehab Prognosis:  Fair Assessment: 55 y.o. right handed male with history of alcohol abuse with associated liver disease, hypertension, OSA (untreated). Per chart review patient lives alone but has supportive a supportive brother. Reported to be independent prior to admission. Presented 10/31/2015 with increasing shortness of breath required intubation ED for airway protection became obtunded. Developed hypotension. Stat CBC with hemoglobin 2.7 and no obvious source of bleeding, lactic acid 14, creatinine 2.51, INR 4.35. CT of the head negative for acute changes. Chest x-ray with  extensive atelectasis right middle lobe and lower lobes.EKG sinus tachycardia with SVT versus  atrial fibrillation placed on amiodarone drip and transition to 200 mg by mouth daily with rate controlled. Echocardiogram with ejection fraction of 55% no wall motion abnormalities. Ultrasound abdomen showed cirrhosis. No liver mass detected. Moderate volume ascites. Follow-up CT abdomen and pelvis showed a large hematoma left upper thigh. Patient was transfused. Maintained on broad-spectrum antibiotics. Hemoglobin stabilized at 7.4 after 6 units packed red blood cells. Underwent paracentesis with 3 L removed 11/02/2015. Gastroenterology follow-up as well as hematology for anemia question DIC related to cirrhosis. No current plan for EGD    Now requiring 24/7 Rehab RN,MD, as well as CIR level PT, OT and SLP.  Treatment team will focus on ADLs and mobility with [facility] See Team Conference Notes for weekly updates to the plan of care

## 2015-11-17 ENCOUNTER — Inpatient Hospital Stay (HOSPITAL_COMMUNITY): Payer: Federal, State, Local not specified - PPO | Admitting: Physical Therapy

## 2015-11-17 ENCOUNTER — Inpatient Hospital Stay (HOSPITAL_COMMUNITY): Payer: Federal, State, Local not specified - PPO

## 2015-11-17 ENCOUNTER — Inpatient Hospital Stay (HOSPITAL_COMMUNITY): Payer: Federal, State, Local not specified - PPO | Admitting: Occupational Therapy

## 2015-11-17 DIAGNOSIS — R188 Other ascites: Secondary | ICD-10-CM | POA: Insufficient documentation

## 2015-11-17 LAB — GLUCOSE, CAPILLARY
GLUCOSE-CAPILLARY: 102 mg/dL — AB (ref 65–99)
GLUCOSE-CAPILLARY: 86 mg/dL (ref 65–99)
Glucose-Capillary: 107 mg/dL — ABNORMAL HIGH (ref 65–99)
Glucose-Capillary: 86 mg/dL (ref 65–99)
Glucose-Capillary: 98 mg/dL (ref 65–99)

## 2015-11-17 MED ORDER — SPIRONOLACTONE 25 MG PO TABS
150.0000 mg | ORAL_TABLET | Freq: Every day | ORAL | Status: DC
  Administered 2015-11-17 – 2015-11-26 (×10): 150 mg via ORAL
  Filled 2015-11-17 (×14): qty 6

## 2015-11-17 MED ORDER — FUROSEMIDE 40 MG PO TABS
60.0000 mg | ORAL_TABLET | Freq: Every day | ORAL | Status: DC
  Administered 2015-11-18 – 2015-11-29 (×12): 60 mg via ORAL
  Filled 2015-11-17 (×12): qty 1

## 2015-11-17 MED ORDER — SPIRONOLACTONE 25 MG PO TABS: 100.0000 mg | ORAL_TABLET | Freq: Every day | ORAL | Status: DC

## 2015-11-17 MED ORDER — LIDOCAINE HCL (PF) 1 % IJ SOLN
INTRAMUSCULAR | Status: AC
  Filled 2015-11-17: qty 10

## 2015-11-17 NOTE — Plan of Care (Signed)
Problem: RH BLADDER ELIMINATION Goal: RH STG MANAGE BLADDER WITH ASSISTANCE STG Manage Bladder With mod Assistance  Outcome: Not Progressing Foley    

## 2015-11-17 NOTE — Progress Notes (Signed)
Physical Therapy Session Note  Patient Details  Name: [name] MRN: [id number] Date of Birth: 02/24/1961  Today's Date: 11/17/2015 PT Individual Time: [id number] PT Individual Time Calculation (min): 27 min   Short Term Goals: Week 1:  PT Short Term Goal 1 (Week 1): Patient with perform sit<>stand with min A and LRAD.  PT Short Term Goal 2 (Week 1): Patient will ambulate with RW and min A for 150.  PT Short Term Goal 3 (Week 1): Patient will propell WC 13450ft at mod I level in controlled environment.   Skilled Therapeutic Interventions/Progress Updates:    Pt received returning to unit & agreeable to PT but noting 8/10 abdominal pain; PT notified RN of pt's pain. Pt reported need to use bed pan & PT [facility] & pt willing. Pt required Mod A to transfer supine>sit, with pt reporting BLE are weak. Head of bed also significantly raised & pt with heavy dependence on BUE pulling on rails. Once sitting at EOB pt able to transfer to standing & pivot to [facility] with use of RW & supervision A, (+) bowel movement & RN notified. [facility] without assistance from PT even with education to wait for assistance. Pt transferred to sitting EOB with supervision in same manner as noted above. Pt performed BLE long arc quads for 2 sets of 10 reps. Pt requested to return supine & pt required Mod A to do so for BLE management. Pt able to scoot to head of bed with multiple scoots & at end of session pt left with bed alarm set, family present & all needs within reach.  Therapy Documentation Precautions:  Precautions Precautions: Fall Restrictions Weight Bearing Restrictions: No  Pain: Pain Assessment Pain Assessment: 0-10 Pain Score: 8  Pain Location: Abdomen Pain Intervention(s): RN made aware;Repositioned   See Function Navigator for Current Functional Status.   Therapy/Group: Individual Therapy  Sandi MariscalVictoria M Mckyle Solanki 11/17/2015, 12:53 PM

## 2015-11-17 NOTE — Telephone Encounter (Signed)
Left message for patient to call the office and speak with a Team Member to schedule a Hospital Follow Up (30 minutes) with Esperanza RichtersEdward [name] within a month.

## 2015-11-17 NOTE — Progress Notes (Signed)
Subjective/Complaints: Feels better after paracentesis. Belly still tight however. Has scrotal/penile edema. penie discomfort when foley care performed.  ROS: Pt denies fever, rash/itching, headache, blurred or double vision, chest pain, shortness of breath, palpitations, dysuria, dizziness, neck or back pain, bleeding, anxiety, or depression    Objective: Vital Signs: Blood pressure 120/81, pulse 97, temperature 98.3 F (36.8 C), temperature source Oral, resp. rate 18, height 6' (1.829 m), weight 109.3 kg (240 lb 15.4 oz), SpO2 99 %. US Paracentesis  11/16/2015  INDICATION: History of alcoholic cirrhosis with recurrent abdominal ascites. Request has been made for therapeutic paracentesis. EXAM: ULTRASOUND GUIDED THERAPEUTIC PARACENTESIS MEDICATIONS: 1% lidocaine COMPLICATIONS: None immediate. PROCEDURE: Informed written consent was obtained from the patient after a discussion of the risks, benefits and alternatives to treatment. A timeout was performed prior to the initiation of the procedure. Initial ultrasound scanning demonstrates a moderate amount of ascites within the left lower abdominal quadrant. The left lower abdomen was prepped and draped in the usual sterile fashion. 1% lidocaine was used for local anesthesia. Following this, a 19 gauge, 7-cm, Yueh catheter was introduced. An ultrasound image was saved for documentation purposes. The paracentesis was performed. The catheter was removed and a dressing was applied. The patient tolerated the procedure well without immediate post procedural complication. FINDINGS: A total of approximately 3 L of clear yellow fluid was removed. IMPRESSION: Successful ultrasound-guided paracentesis yielding 3 liters of peritoneal fluid. Read by: Saverio Danker, PA-C Electronically Signed   By: Corrie Mckusick D.O.   On: 11/16/2015 11:44   Results for orders placed or performed during the hospital encounter of 11/13/15 (from the past 72 hour(s))  Glucose, capillary      Status: None   Collection Time: 11/14/15 12:05 PM  Result Value Ref Range   Glucose-Capillary 98 65 - 99 mg/dL  Glucose, capillary     Status: Abnormal   Collection Time: 11/14/15  5:33 PM  Result Value Ref Range   Glucose-Capillary 114 (H) 65 - 99 mg/dL  Glucose, capillary     Status: Abnormal   Collection Time: 11/14/15  8:40 PM  Result Value Ref Range   Glucose-Capillary 119 (H) 65 - 99 mg/dL  Glucose, capillary     Status: None   Collection Time: 11/15/15  6:37 AM  Result Value Ref Range   Glucose-Capillary 81 65 - 99 mg/dL   Comment 1 Notify RN   Glucose, capillary     Status: Abnormal   Collection Time: 11/15/15 12:10 PM  Result Value Ref Range   Glucose-Capillary 113 (H) 65 - 99 mg/dL  Glucose, capillary     Status: Abnormal   Collection Time: 11/15/15  4:43 PM  Result Value Ref Range   Glucose-Capillary 105 (H) 65 - 99 mg/dL  Glucose, capillary     Status: Abnormal   Collection Time: 11/15/15  8:32 PM  Result Value Ref Range   Glucose-Capillary 111 (H) 65 - 99 mg/dL  C difficile quick scan w PCR reflex     Status: None   Collection Time: 11/16/15  1:38 AM  Result Value Ref Range   C Diff antigen NEGATIVE NEGATIVE   C Diff toxin NEGATIVE NEGATIVE   C Diff interpretation Negative for toxigenic C. difficile   CBC WITH DIFFERENTIAL     Status: Abnormal   Collection Time: 11/16/15  5:46 AM  Result Value Ref Range   WBC 11.5 (H) 4.0 - 10.5 K/uL   RBC 3.00 (L) 4.22 - 5.81 MIL/uL   Hemoglobin 9.1 (  L) 13.0 - 17.0 g/dL   HCT 27.6 (L) 39.0 - 52.0 %   MCV 92.0 78.0 - 100.0 fL   MCH 30.3 26.0 - 34.0 pg   MCHC 33.0 30.0 - 36.0 g/dL   RDW 21.2 (H) 11.5 - 15.5 %   Platelets 173 150 - 400 K/uL   Neutrophils Relative % 80 %   Lymphocytes Relative 8 %   Monocytes Relative 9 %   Eosinophils Relative 1 %   Basophils Relative 2 %   Neutro Abs 9.3 (H) 1.7 - 7.7 K/uL   Lymphs Abs 0.9 0.7 - 4.0 K/uL   Monocytes Absolute 1.0 0.1 - 1.0 K/uL   Eosinophils Absolute 0.1 0.0 - 0.7  K/uL   Basophils Absolute 0.2 (H) 0.0 - 0.1 K/uL   RBC Morphology TARGET CELLS     Comment: ACANTHOCYTES POLYCHROMASIA PRESENT TEARDROP CELLS   Comprehensive metabolic panel     Status: Abnormal   Collection Time: 11/16/15  5:46 AM  Result Value Ref Range   Sodium 137 135 - 145 mmol/L   Potassium 4.1 3.5 - 5.1 mmol/L   Chloride 103 101 - 111 mmol/L   CO2 23 22 - 32 mmol/L   Glucose, Bld 114 (H) 65 - 99 mg/dL   BUN 25 (H) 6 - 20 mg/dL   Creatinine, Ser 1.34 (H) 0.61 - 1.24 mg/dL   Calcium 8.3 (L) 8.9 - 10.3 mg/dL   Total Protein 7.6 6.5 - 8.1 g/dL   Albumin 2.1 (L) 3.5 - 5.0 g/dL   AST 62 (H) 15 - 41 U/L   ALT 35 17 - 63 U/L   Alkaline Phosphatase 84 38 - 126 U/L   Total Bilirubin 13.4 (H) 0.3 - 1.2 mg/dL   GFR calc non Af Amer 59 (L) >60 mL/min   GFR calc Af Amer >60 >60 mL/min    Comment: (NOTE) The eGFR has been calculated using the CKD EPI equation. This calculation has not been validated in all clinical situations. eGFR's persistently <60 mL/min signify possible Chronic Kidney Disease.    Anion gap 11 5 - 15  Glucose, capillary     Status: Abnormal   Collection Time: 11/16/15  6:43 AM  Result Value Ref Range   Glucose-Capillary 102 (H) 65 - 99 mg/dL  Ammonia     Status: None   Collection Time: 11/16/15  9:24 AM  Result Value Ref Range   Ammonia 34 9 - 35 umol/L  Glucose, capillary     Status: Abnormal   Collection Time: 11/16/15 11:40 AM  Result Value Ref Range   Glucose-Capillary 114 (H) 65 - 99 mg/dL  Glucose, capillary     Status: Abnormal   Collection Time: 11/16/15  4:37 PM  Result Value Ref Range   Glucose-Capillary 111 (H) 65 - 99 mg/dL  Glucose, capillary     Status: Abnormal   Collection Time: 11/16/15  9:42 PM  Result Value Ref Range   Glucose-Capillary 102 (H) 65 - 99 mg/dL  Glucose, capillary     Status: Abnormal   Collection Time: 11/17/15  6:38 AM  Result Value Ref Range   Glucose-Capillary 107 (H) 65 - 99 mg/dL     HEENT: icteric Cardio:  RRR and no murmurs Resp: CTA B/L and unlabored GI: Distention and ascites, abdomen improved but still tight---ostomy in place to collect draining fluid Extremity:  Edema edema in both thighs but minimal edema in the feet Skin:   Other jaundice Neuro: Alert/Oriented, Normal Sensory and Abnormal  Motor 4/5 bilateral deltoid, biceps, triceps, grip, 2 minus hip flexor knee extensor 3 minus ankle dorsiflexor and plantar flexor Musc/Skel:  Normal Gen. no acute distress   Assessment/Plan: 1. Functional deficits secondary to Debilitation secondary to respiratory failure/sepsis, complications from cirrhosis of liver/ascites. which require 3+ hours per day of interdisciplinary therapy in a comprehensive inpatient rehab setting. Physiatrist is providing close team supervision and 24 hour management of active medical problems listed below. Physiatrist and rehab team continue to assess barriers to discharge/monitor patient progress toward functional and medical goals. FIM: Function - Bathing Position: Wheelchair/chair at sink Body parts bathed by patient: Right arm, Left arm, Chest, Abdomen, Right upper leg, Left upper leg Body parts bathed by helper: Front perineal area, Buttocks, Right lower leg, Left lower leg, Back Assist Level: Touching or steadying assistance(Pt > 75%)  Function- Upper Body Dressing/Undressing What is the patient wearing?: Pull over shirt/dress Pull over shirt/dress - Perfomed by patient: Thread/unthread right sleeve, Thread/unthread left sleeve, Put head through opening Pull over shirt/dress - Perfomed by helper: Pull shirt over trunk Assist Level: Touching or steadying assistance(Pt > 75%) Function - Lower Body Dressing/Undressing What is the patient wearing?: Pants, Underwear, Non-skid slipper socks, Shoes Position: Wheelchair/chair at sink Underwear - Performed by helper: Thread/unthread right underwear leg, Thread/unthread left underwear leg, Pull underwear up/down Pants-  Performed by helper: Pull pants up/down, Thread/unthread left pants leg, Thread/unthread right pants leg Non-skid slipper socks- Performed by helper: Don/doff right sock, Don/doff left sock Shoes - Performed by helper: Don/doff right shoe, Don/doff left shoe, Fasten right, Fasten left Assist for footwear: Maximal assist Assist for lower body dressing: Touching or steadying assistance (Pt > 75%)  Function - Toileting Toileting activity did not occur: N/A Toileting steps completed by patient: Performs perineal hygiene Toileting steps completed by helper: Adjust clothing prior to toileting, Adjust clothing after toileting Toileting Assistive Devices: Grab bar or rail Assist level: Touching or steadying assistance (Pt.75%)  Function - Air cabin crew transfer activity did not occur: N/A Toilet transfer assistive device: Walker Assist level to toilet: Touching or steadying assistance (Pt > 75%) (per [place], NT report) Assist level from toilet: Touching or steadying assistance (Pt > 75%)  Function - Chair/bed transfer Chair/bed transfer activity did not occur: N/A Chair/bed transfer method: Stand pivot Chair/bed transfer assist level: Moderate assist (Pt 50 - 74%/lift or lower) Chair/bed transfer assistive device: Armrests, Walker Chair/bed transfer details: Tactile cues for sequencing, Tactile cues for weight shifting, Tactile cues for posture, Verbal cues for technique, Verbal cues for sequencing, Verbal cues for precautions/safety, Verbal cues for safe use of DME/AE, Visual cues for safe use of DME/AE  Function - Locomotion: Wheelchair Will patient use wheelchair at discharge?: Yes Type: Manual Max wheelchair distance: 100 Assist Level: Supervision or verbal cues Assist Level: Supervision or verbal cues Wheel 150 feet activity did not occur: Safety/medical concerns Turns around,maneuvers to table,bed, and toilet,negotiates 3% grade,maneuvers on rugs and over doorsills:  No Function - Locomotion: Ambulation Assistive device: Walker-rolling Max distance: 90 Assist level: Touching or steadying assistance (Pt > 75%) Assist level: Moderate assist (Pt 50 - 74%) Assist level: Touching or steadying assistance (Pt > 75%) Walk 150 feet activity did not occur: Safety/medical concerns Assist level: Moderate assist (Pt 50 - 74%)  Function - Comprehension Comprehension: Auditory Comprehension assist level: Follows basic conversation/direction with no assist  Function - Expression Expression: Verbal Expression assist level: Expresses basic needs/ideas: With no assist  Function - Social Interaction Social Interaction assist level:  Interacts appropriately 90% of the time - Needs monitoring or encouragement for participation or interaction.  Function - Problem Solving Problem solving assist level: Solves basic 75 - 89% of the time/requires cueing 10 - 24% of the time  Function - Memory Memory assist level: Recognizes or recalls 50 - 74% of the time/requires cueing 25 - 49% of the time Patient normally able to recall (first 3 days only): Current season, Location of own room, Staff names and faces, That he or she is in a hospital    Medical Problem List and Plan: 1.  Debilitation secondary to respiratory failure/sepsis, complications from cirrhosis of liver/ascites. Status post paracentesis 11/02/2015             -continue therapies as tolerated--he is willing to work through GI issues 2.  DVT Prophylaxis/Anticoagulation: SCDs. Monitor for any signs of DVT 3. Pain Management: Tylenol as needed 4. Anemia/thrombocytopenia question DIC related to cirrhosis. Status post transfused multiple units. Follow-up CBC CBC Latest Ref Rng 11/16/2015 11/12/2015 11/11/2015  WBC 4.0 - 10.5 K/uL 11.5(H) 13.3(H) 13.5(H)  Hemoglobin 13.0 - 17.0 g/dL 9.1(L) 8.6(L) 8.6(L)  Hematocrit 39.0 - 52.0 % 27.6(L) 26.3(L) 25.4(L)  Platelets 150 - 400 K/uL 173 149(L) 141(L)    5. Neuropsych:  This patient is capable of making decisions on his own behalf. 6. Skin/Wound Care: Routine skin checks 7. Fluids/Electrolytes/Nutrition: encourage po as possible 8. Hypertension. No current antihypertensive medication. Follow with increased mobility 9. Acute renal insufficiency. Resolving. Follow-up chemistries essentially stable BMP Latest Ref Rng 11/16/2015 11/13/2015 11/12/2015  Glucose 65 - 99 mg/dL 114(H) 128(H) 101(H)  BUN 6 - 20 mg/dL 25(H) 28(H) 30(H)  Creatinine 0.61 - 1.24 mg/dL 1.34(H) 1.36(H) 1.44(H)  Sodium 135 - 145 mmol/L 137 138 138  Potassium 3.5 - 5.1 mmol/L 4.1 3.2(L) 3.4(L)  Chloride 101 - 111 mmol/L 103 104 105  CO2 22 - 32 mmol/L 23 25 24   Calcium 8.9 - 10.3 mg/dL 8.3(L) 7.8(L) 7.8(L)    10. Sinus tachycardia with SVT versus atrial fibrillation. Amiodarone 200 mg daily.  11. Liver failure with ascites: repeat paracentesis today per IR  -needs GI follow up as well  -urostomy bag over paracentesis site which continues to drain due to high volume and pressure   Poor urine output, third spacing. lasix increased to BID   -monitor renal staus  -LFT's fairly stable, ammonia level ok 12. Penile/scrotal edema----local care/lidocaine gel for comfort  -needs rx of #11  LOS (Days) 4 A FACE TO FACE EVALUATION WAS PERFORMED  Taia Bramlett T 11/17/2015, 8:56 AM

## 2015-11-17 NOTE — Progress Notes (Signed)
Occupational Therapy Session Note  Patient Details  Name: [name] MRN: [id number] Date of Birth: 12/16/1960  Today's Date: 11/17/2015 OT Individual Time: [id number] OT Individual Time Calculation (min): 10 min     Make Up session.     Skilled Therapeutic Interventions/Progress Updates:    Pt completed 10 mins of 45 mins make up session secondary to going down for second paracentesis.  Began session with hamstrings stretch on the RLE as pt reporting moderate pain on the back of his leg above the knee with functional mobility.  He was able to tolerate light stretches with knee straight and knee bent before transitioning to sitting on the EOB with supervision.  Completed one sit to stand transition with supervision before returning to supine for transport to procedure.  Oxygen sats checked on room air at 91% with HR at 109.    Therapy Documentation Precautions:  Precautions Precautions: Fall Restrictions Weight Bearing Restrictions: No  Pain: Pain Assessment Pain Assessment: Faces Faces Pain Scale: Hurts little more Pain Type: Acute pain Pain Location: Leg Pain Orientation: Right;Posterior Pain Descriptors / Indicators: Nagging;Sore Pain Intervention(s): Repositioned;Ambulation/increased activity ADL: See Function Navigator for Current Functional Status.   Therapy/Group: Individual Therapy  Grae Leathers OTR/L 11/17/2015, 1:51 PM

## 2015-11-17 NOTE — Plan of Care (Signed)
Problem: RH Bathing Goal: LTG Patient will bathe with assist, cues/equipment (OT) LTG: Patient will bathe specified number of body parts with assist with/without cues using equipment (position) (OT)  Downgraded JLS

## 2015-11-17 NOTE — Progress Notes (Signed)
Social Work Assessment and Plan  Patient Details  Name: [name] MRN: [id number] Date of Birth: [birth year]  Today's Date: 11/16/2015  Problem List:  Patient Active Problem List   Diagnosis Date Noted  . Ascites   . Debilitated 11/13/2015  . Encephalopathy, hepatic ([place])   . Goals of care, counseling/discussion   . Counseling regarding goals of care   . Decompensated liver disease ([place])   . Anasarca   . Bleeding   . Palliative care encounter   . Absolute anemia   . Bilateral lower extremity edema   . Acute respiratory failure with hypoxia ([place])   . AKI (acute kidney injury) ([place])   . Alcoholic cirrhosis of liver with ascites ([place])   . Altered mental status   . Elevated INR   . Lactic acidosis   . Subacute liver failure without hepatic coma   . Protein-calorie malnutrition, severe ([place]) 11/02/2015  . Shock ([place]) 10/31/2015  . Anemia 10/31/2015  . Cirrhosis ([place]) 10/31/2015  . Acute respiratory failure ([place]) 10/31/2015  . Pressure ulcer 10/31/2015  . Thrombocytopenia ([place]) 11/28/2014  . Allergic rhinitis 11/14/2014  . Arthritis 11/14/2014  . Cataracts, bilateral 11/14/2014  . Sleep apnea 11/14/2014  . HTN (hypertension) 11/14/2014  . Neuropathy ([place]) 11/14/2014  . Achilles tendon tear 11/14/2014  . Pedal edema 11/14/2014  . Renal failure 11/12/2013  . Syncope 11/12/2013   Past Medical History:  Past Medical History  Diagnosis Date  . Hypertension   . Allergy   . Arthritis   . Cataract   . Heart murmur   . Sleep apnea     Problems with sleeping  . Neuromuscular disorder ([facility])     neuropathy rt lateral calf.  . Anemia 10/31/2015   Past Surgical History:  Past Surgical History  Procedure Laterality Date  . Foot surgery Right   . Nasal septum surgery    . Vasectomy     Social History:  reports that he has quit smoking. His smoking use included Cigarettes. He has never used smokeless tobacco. He reports that he drinks alcohol. He reports that he does not  use illicit drugs.  Family / Support Systems Marital Status: Single Patient Roles: Parent, Other (Comment) (significant other; father; brother; employee) Spouse/Significant Other: Lawernce Ion - friend - [phone] Children: 2 dtrs - one in [place] and one in [place] Other Supports: Laureano Hetzer - brother - [phone]; sister-in-law; sister; other family members Anticipated Caregiver: unknown at this time - family is working on this currently Ability/Limitations of Caregiver: Ms. Sherral Hammers works sunday - thursdays and does not live with pt Caregiver Availability: Intermittent Family Dynamics: Pt has good support from his siblings.  Dtrs both live out of town/state.  One dtr is trying to work out her schedule to help pt, per [facility] note.  CSW has not verified this.  Sister and sister-in-law told CSW that family was just beginning to talk about pt's plan when he goes home.  Social History Preferred language: English Religion: None Cultural Background: Pt was in the [employer] Education: [employer] Read: Yes Write: Yes Employment Status: Disabled Date Retired/Disabled/Unemployed: Pt has been on workers' comp since August 2015 from the [employer] due to an injury. Legal History/Current Legal Issues: none reported Guardian/Conservator: N/A - MD has stated that pt is capable of making his own decisions.   Abuse/Neglect Physical Abuse: Denies Verbal Abuse: Denies Sexual Abuse: Denies Exploitation of patient/patient's resources: Denies Self-Neglect: Denies  Emotional Status Pt's affect, behavior and  adjustment status: Pt reports getting down sometimes, but he doesn't let himself stay there long.  Spending time with family helps him.  Pt does admit to drinking more during these times. 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  He has been drinking more, per sister and sister-in-law, since he's been out of work.    Psychiatric History: none reported Substance Abuse History: Pt uses alcohol regularly, but he and his family admitted to pt using it more since he's been out of work.  Pt's family reports that even when pt couldn't go out of the house to purchase alcohol, his girlfriend would bring it to him.  Patient / Family Perceptions, Expectations & Goals Pt/Family understanding of illness & functional limitations: Pt/family report a good understanding of pt's condition and feel their questions have been answered.   Premorbid pt/family roles/activities: Pt likes to go outside and visit with friends/family. Anticipated changes in roles/activities/participation: Pt would like to resume activities as he is able.  CSW talked with him about the importance of NOT resuming his drinking.  Pt was somewhat open to resources on alcohol support groups. Pt/family expectations/goals: Pt had difficulty stating his goal(s) as he became very tired at the end of our visit.  Community Resources [employer]: Other (Comment) (Pt is out of work due to injury at work as a Special educational needs teacher for the [employer].) Premorbid Home Care/DME Agencies: Other (Comment) (Pt has a single point cane - agency unknown) Transportation available at discharge: family Resource referrals recommended: Neuropsychology, Support group (specify)  Discharge Planning Living Arrangements: Alone Support Systems: Spouse/significant other, Children, Other relatives, Friends/neighbors Type of Residence: Private residence Insurance Resources: Multimedia programmer (specify) ([employer]) Museum/gallery curator Resources: Other (Comment) (Pt receives a workers' Games developer and a Scientist, research (medical) Screen Referred: No Living Expenses: Medical laboratory scientific officer Management: Patient Does the patient have any problems obtaining your medications?: No Home Management: Pt has hired someone to help with the outside of the house.  His S.O. helps with the inside of the  house. Patient/Family Preliminary Plans: Pt's family is working on a plan for pt for care when he leaves inpt rehab.  They have recently started this conversation.  CSW to clarify goals. Barriers to Discharge: Self care, Family Support Social Work Anticipated Follow Up Needs: HH/OP Expected length of stay: 7 to 14 days  Clinical Impression CSW met with pt and his sister and sister-in-law on 11-16-15 to introduce self and role of CSW, as well as to complete assessment.  Pt was engaged in the beginning of the visit, but tired toward the end and could not continue to talk with CSW.  He was able to tell CSW that he was up all night with stomach pain and did not sleep.  Toward the end of CSW visit, pt was seeing someone next to [employer] and was talking with them.  Pt's family stated that he's been doing that throughout his hospitalization not just on Rehab.  Pt was fairly open with CSW about his alcohol use, admitting to more use since he's been feeling poorly physically and emotionally.  CSW will give him resources on [employer].  Family is beginning to discuss what pt's plan will be when he is d/c'd from CIR.  Some notes state that one of pt's dtrs may come to help, but that has not been confirmed by family with CSW at this time.  Pt does report  good emotional support from family and girlfriend helps with his home upkeep, but sisters feel she is supplying pt with alcohol and they are worried about that and trying to figure out what to do with the situation.  CSW will continue to follow and assist as needed.  Galadriel Shroff, Silvestre Mesi 11/17/2015, 1:40 PM

## 2015-11-17 NOTE — Progress Notes (Signed)
Occupational Therapy Session Note  Patient Details  Name: [name] MRN: [id number] Date of Birth: 08/11/1960  Today's Date: 11/17/2015 OT Individual Time: 0930-1100 OT Individual Time Calculation (min): 90 min    Short Term Goals: Week 1:  OT Short Term Goal 1 (Week 1): STG= LTG  Skilled Therapeutic Interventions/Progress Updates:    Pt resting in w/c upon arrival.  Pt stated he had already washed up but agreeable to donning clothing this morning.  Pt performed sit<>stand X 6 during BADL portion of session.  Pt used a reacher to assist with threading pants.  Pt required more than a reasonable amount of time to complete dressing tasks.  Pt propelled w/c to therapy gym and initially engaged in BUE therex with weighted balls and 10 mins (level 3) on [employer].  Pt transitioned to standing activities to increase activity tolerance/endurance and dynamic standing balance.  Pt c/o increased pain in upper R leg with standing tasks.  Pt propelled back to room and remained in w/c with all needs within reach. Focus on activity tolerance, sit<>stand, standing balance, functional transfers, and safety awareness to increase independence with BADLs.   Therapy Documentation Precautions:  Precautions Precautions: Fall Restrictions Weight Bearing Restrictions: No   Pain: Pain Assessment Pain Assessment: 0-10 Pain Score: 8  Pain Type: Acute pain Pain Location: Leg Pain Orientation: Right Pain Descriptors / Indicators: Aching;Throbbing Pain Onset: On-going Pain Intervention(s): Repositioned;Rest;RN made aware  See Function Navigator for Current Functional Status.   Therapy/Group: Individual Therapy  [name], [name] 11/17/2015, 11:06 AM

## 2015-11-17 NOTE — Procedures (Signed)
Successful US guided paracentesis from LLQ.  Yielded 4.8 liters of clear yellow fluid.  No immediate complications.  Pt tolerated well.    Zyheir Daft S Zubair Lofton PA-C 11/17/2015 2:48 PM

## 2015-11-17 NOTE — Progress Notes (Signed)
Paracentesis site drainage 115cc this am, dark orange color. Wayne Wicklund, Phill MutterMelissa Rebecca

## 2015-11-17 NOTE — Progress Notes (Signed)
Dr. Riley KillSwartz on rounds and notified of increased swelling to penis and scrotum with skin breakdown to shaft. No new orders. Rudie MeyerEurillo, Zahi Plaskett A, RN

## 2015-11-17 NOTE — Consult Note (Signed)
[facility] Consult: 12:18 PM 11/17/2015  LOS: 4 days    Referring Provider: Dr Naaman Plummer  Primary Care Physician:  Elise Benne Primary Gastroenterologist:  Dr. Hilarie Fredrickson, see as unassigned pt on 11/01/15.   See GI consult of 11/01/15 when pt admitted to [facility].   Reason for Consultation:  Ascites    HPI: [name] is a 55 y.o. male. 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 Anemia (Hgb 10.1 in 12/2010) that PMD planned hematology referral in 11/2014. Thrombocytopenia of 125 in 12/2010. Untreated OSA. EF 50 to 55% by 10/2015 echo.   4/15 - 11/13/15 admission with resp failure, obtundation requiring intubation.  + AKI, lactic acidosis, PNA, Afib vs SVT.   Ultrasound showed new dx of cirrhosis.  Decompensted:  MELD 33, Childs C.   Platelets 23 K, coags 40/4.3. Hgb 2.7.    Transfused multiple PRBCs ( at least 12), FFP, cryoprecipitate.  There was no blood per NGT.  Did have bleeding from lips, lines and large thigh hematoma along with bone marrow supression all felt contributing to anemia.    CT with splenic flexure colonic thickening, ? Ischemic vs infectious colitis vs reactive vs hypoalbuminemia related in pt with anasarca, severe 3rd spacing.   Ultrasound: cirrhosis, hepatofugal flow in portal vein, moderate ascites. + protein cal malnutrition.   EGD considered but deferred as overall poor prognosis, no overt GI bleeding.     Paracentesis:  4/19: 3.3 liters. (bedside per [facility]).  Labs negative for SBP and malignant cells.  Cultures negative 4/24: 1.8 liters.  5/1: 3 liters  Discharged to [facility] and ascites mgt is now an issue.   No diuretics at discharge but now on Lasix 60 mg BID.    Abdominal pain is  all over, relieved after tap.  Lower extremity edema continues.  Weight today in 240#, was 237 on 4/28, 232 on 4/30, 241 on 5/1.      Nausea intermittently, last was yesterday.  Pt uncertain if nausea better after tap, but he has none today.  Eating 50 to 100% of meals.  On daily PPI.  2 to 3 stools per day, loose and not bloody or melenic.  PT progressing to some limited walker assisted ambulation.   T bili is 13.4, was 2.8 on admission 17 days ago. Transaminases and Alk phos improved.  coags improved but remain elevated at 27.8/2.6.     Past Medical History  Diagnosis Date  . Hypertension   . Allergy   . Arthritis   . Cataract   . Heart murmur   . Sleep apnea     Problems with sleeping  . Neuromuscular disorder (HCC)     neuropathy rt lateral calf.  . Anemia 10/31/2015    Past Surgical History  Procedure Laterality Date  . Foot surgery Right   . Nasal septum surgery    . Vasectomy      Prior to Admission medications   Medication Sig Start Date End Date Taking? Authorizing  Provider  amiodarone (PACERONE) 200 MG tablet Take 1 tablet (200 mg total) by mouth daily. 11/13/15   Velvet Bathe, MD  azithromycin (ZITHROMAX) 500 MG tablet Take 1 tablet (500 mg total) by mouth daily. 11/13/15   Velvet Bathe, MD  cefdinir (OMNICEF) 300 MG capsule Take 1 capsule (300 mg total) by mouth 2 (two) times daily. 11/13/15   Velvet Bathe, MD  furosemide (LASIX) 20 MG tablet Take 3 tablets (60 mg total) by mouth daily. 11/13/15   Velvet Bathe, MD  lactulose (CHRONULAC) 10 GM/15ML solution Take 45 mLs (30 g total) by mouth daily. 11/13/15   Velvet Bathe, MD  omeprazole (PRILOSEC) 20 MG capsule Take 20 mg by mouth daily.    Historical Provider, MD  potassium chloride SA (K-DUR,KLOR-CON) 20 MEQ tablet Take 1 tablet (20 mEq total) by mouth once. 11/28/14   Percell Miller [name], PA-C  thiamine 100 MG tablet Take 1 tablet (100 mg total) by mouth daily. 11/13/15   Velvet Bathe, MD    Scheduled Meds: . amiodarone   200 mg Oral Daily  . azithromycin  500 mg Oral Daily  . cefTRIAXone (ROCEPHIN)  IV  2 g Intravenous Q24H  . folic acid  1 mg Oral Daily  . furosemide  60 mg Oral BID  . insulin aspart  0-9 Units Subcutaneous TID WC  . lactulose  30 g Oral Daily  . pantoprazole  40 mg Oral Daily  . potassium chloride  40 mEq Oral BID  . thiamine  100 mg Oral Daily   Infusions:   PRN Meds: acetaminophen, albuterol, lidocaine, loratadine, ondansetron **OR** ondansetron (ZOFRAN) IV, sorbitol   Allergies as of 11/13/2015 - Review Complete 11/13/2015  Allergen Reaction Noted  . Lactose intolerance (gi)  11/12/2013    Family History  Problem Relation Age of Onset  . Atrial fibrillation Mother   . CVA Mother   . Hypertension Mother   . Emphysema Mother     Social History   Social History  . Marital Status: Single    Spouse Name: N/A  . Number of Children: N/A  . Years of Education: N/A   Occupational History  . Not on file.   Social History Main Topics  . Smoking status: Former Smoker    Types: Cigarettes  . Smokeless tobacco: Never Used  . Alcohol Use: 0.0 oz/week    0 Standard drinks or equivalent per week     Comment: . 4-5 drinks glasses of wine at night.(every night)  . Drug Use: No  . Sexual Activity: Yes   Other Topics Concern  . Not on file   Social History Narrative    REVIEW OF SYSTEMS: Constitutional:  Weak.   ENT:  No nose bleeds Pulm:  No SOB or cough.   CV:  No palpitations, no LE edema.  GU:  No hematuria, no frequency GI:  Per HPI Heme:  Per HPI   Transfusions:  Per HPI Neuro:  No headaches, no peripheral tingling or numbness Derm:  No itching, no rash or sores.  Endocrine:  No sweats or chills.  No polyuria or dysuria Immunization:  Not queried.  Travel:  None beyond local counties in last few months.    PHYSICAL EXAM: Vital signs in last 24 hours: Filed Vitals:   11/16/15 1450 11/17/15 0534  BP: 122/83 120/81  Pulse: 85 97  Temp: 98.2 F (36.8  C) 98.3 F (36.8 C)  Resp: 19 18   Wt Readings from Last 3 Encounters:  11/17/15 109.3  kg (240 lb 15.4 oz)  11/12/15 108.047 kg (238 lb 3.2 oz)  11/28/14 89.268 kg (196 lb 12.8 oz)    General: ill looking but looks better than 2 weeks ago Head:  No asymmetry or edema  Eyes:  No icterus or conj pallor Ears:  Not HOH  Nose:  No congestion or discharge Mouth:  Clear, moist oral mm.  Teeth ok Neck:  No masses, TMG or JVD Lungs:  Diminished but no cough or labored resps Heart: RRR.  No mrg Abdomen:  Somewhat tense, protuberant, NT.  BS active.   Rectal: deferred   Musc/Skeltl: no joint swelling or erythema Extremities:  Significant ascites in both legs, anasarca.  Pitting 2 plus.  Neurologic:  Oriented x 3.  + resting UE tremor but no asterixis.  Limb strength not tested. Moves all 4s.    Skin:  Some pallor,  Psych: calm, relaxed, cooperative.   Intake/Output from previous day: 05/01 0701 - 05/02 0700 In: 600 [P.O.:600] Out: 770 [Urine:770] Intake/Output this shift: Total I/O In: 360 [P.O.:360] Out: -   LAB RESULTS:  Recent Labs  11/16/15 0546  WBC 11.5*  HGB 9.1*  HCT 27.6*  PLT 173   BMET Lab Results  Component Value Date   NA 137 11/16/2015   NA 138 11/13/2015   NA 138 11/12/2015   K 4.1 11/16/2015   K 3.2* 11/13/2015   K 3.4* 11/12/2015   CL 103 11/16/2015   CL 104 11/13/2015   CL 105 11/12/2015   CO2 23 11/16/2015   CO2 25 11/13/2015   CO2 24 11/12/2015   GLUCOSE 114* 11/16/2015   GLUCOSE 128* 11/13/2015   GLUCOSE 101* 11/12/2015   BUN 25* 11/16/2015   BUN 28* 11/13/2015   BUN 30* 11/12/2015   CREATININE 1.34* 11/16/2015   CREATININE 1.36* 11/13/2015   CREATININE 1.44* 11/12/2015   CALCIUM 8.3* 11/16/2015   CALCIUM 7.8* 11/13/2015   CALCIUM 7.8* 11/12/2015   LFT  Recent Labs  11/16/15 0546  PROT 7.6  ALBUMIN 2.1*  AST 62*  ALT 35  ALKPHOS 84  BILITOT 13.4*   PT/INR Lab Results  Component Value Date   INR 2.63* 11/05/2015   INR  2.77* 11/04/2015   INR 3.73* 11/03/2015   Hepatitis Panel No results for input(s): HEPBSAG, HCVAB, HEPAIGM, HEPBIGM in the last 72 hours. C-Diff No components found for: CDIFF Lipase     Component Value Date/Time   LIPASE 270* 10/31/2015 1505    Drugs of Abuse     Component Value Date/Time   LABOPIA NONE DETECTED 11/13/2013 0534   COCAINSCRNUR NONE DETECTED 11/13/2013 0534   LABBENZ NONE DETECTED 11/13/2013 0534   AMPHETMU NONE DETECTED 11/13/2013 0534   THCU NONE DETECTED 11/13/2013 0534   LABBARB NONE DETECTED 11/13/2013 0534     RADIOLOGY STUDIES: US Paracentesis  11/16/2015  INDICATION: History of alcoholic cirrhosis with recurrent abdominal ascites. Request has been made for therapeutic paracentesis. EXAM: ULTRASOUND GUIDED THERAPEUTIC PARACENTESIS MEDICATIONS: 1% lidocaine COMPLICATIONS: None immediate. PROCEDURE: Informed written consent was obtained from the patient after a discussion of the risks, benefits and alternatives to treatment. A timeout was performed prior to the initiation of the procedure. Initial ultrasound scanning demonstrates a moderate amount of ascites within the left lower abdominal quadrant. The left lower abdomen was prepped and draped in the usual sterile fashion. 1% lidocaine was used for local anesthesia. Following this, a 19 gauge, 7-cm, Yueh catheter was introduced. An ultrasound image was saved for documentation purposes.  The paracentesis was performed. The catheter was removed and a dressing was applied. The patient tolerated the procedure well without immediate post procedural complication. FINDINGS: A total of approximately 3 L of clear yellow fluid was removed. IMPRESSION: Successful ultrasound-guided paracentesis yielding 3 liters of peritoneal fluid. Read by: Saverio Danker, PA-C Electronically Signed   By: Corrie Mckusick D.O.   On: 11/16/2015 11:44    ENDOSCOPIC STUDIES: none  IMPRESSION:   *  Decompensated cirrhosis due to ETOH, fatty liver.   Hep ABC negative.  Ascites, recurring after 3 paracentesis.  Lasix added on 4/29, doses have gone from 60 to 120 daily since then.   t bili is 13.4, and this is an upward trend  *  Coagulopathy, persistent though improved. .  Received FFP. Vitamin k during recent admission.   *  Hyponatremia. Resolved.    *  Severe anemia, multifactorial, long-standing.  Hgb stable, improved at 9.1.  Though FOBT + on 4/15, no overt CG, blood per NGT and no melena or BPR.  No evidence varices on CT scan.  EGD deferred by Dr Carlean Purl.   *  Severe protein cal malnutrition. Appetite fairl  *  Nausea.  May be related to mass effect on stomach from ascites.  *  Encephalopathy, hepatic and metabolic. Max ammonia of just 57.  Much improved MS c/w admission.  Having 2 to 3 stools per day on Lactulose 30 gm daily. Marland Kitchen      PLAN:     *  Add Aldactone 150 and drop Lasix to 60 once daily.  Can titrate up diuretics if no adverse effects on kidney.  Paracentesis prn, note another tap ordered for today.  .  If ascites not responsive to diuretic, may need to consider TIPS.  Prefer to avoid this given hx encephalopathy.  *  Does he still need abx for the PNA, has been treated with abx for 17 days, last CXR was on 4/24.   No fever, WBCs improved but declining.   Always worry about C diff (though negative on 5/1)     Azucena Freed  11/17/2015, 12:18 PM Pager: [phone]   Attending physician's note   I have taken a history, examined the patient and reviewed the chart. I agree with the Advanced Practitioner's note, impression and recommendations.55 year old male with alcoholic decompensated cirrhosis, significant ascites and peripheral edema. We will adjust the diuretics Aldactone and Lasix. If renal function stable can consider increasing Aldactone further to 29m daily and lasix 816mdaily in the next 5-7 days and will likely need therapeutic paracentesis if no response to diuretics in next few days. If ascites is refractory  to diuretics requiring frequent paracentesis, will have to consider TIPS  K VeDenzil MagnusonMD [phone]-Fri 8a-5p [id number] 5p, weekends, holidays

## 2015-11-17 NOTE — Progress Notes (Signed)
Physical Therapy Session Note  Patient Details  Name: [name] MRN: [id number] Date of Birth: 11/06/1960  Today's Date: 11/17/2015 PT Individual Time: 0800-0900  PT Individual Time Calculation (min): 60 min   Short Term Goals: Week 1:  PT Short Term Goal 1 (Week 1): Patient with perform sit<>stand with min A and LRAD.  PT Short Term Goal 2 (Week 1): Patient will ambulate with RW and min A for 150.  PT Short Term Goal 3 (Week 1): Patient will propell WC 16050ft at mod I level in controlled environment.   Skilled Therapeutic Interventions/Progress Updates:   Treatment 1: Patient in bed in brighter spirits this AM, reporting feeling better after paracentesis. Transferred to edge of bed with HOB raised and dependence on bed rails with supervision, sit > stand from edge of bed using RW with mod A, and stand pivot transfer to wheelchair using RW with steady assist. Patient brushed teeth from wheelchair level with increased time. Patient propelled wheelchair to gym using BUE with supervision and extra time x 100 ft. Gait training using RW x 90 ft with min guard, patient demonstrates slow gait speed with decreased foot clearance, forward flexed posture, and wide BOS and c/o increased RLE pain. Stair training up/down 8 (3") stairs using 2 rails with min guard and step-to pattern ascending leading with LLE and descending leading with LLE. Instructed in sit <> stand transfer training from wheelchair using RW x 5 with steady assist > supervision. Switched out wheelchair for higher floor to seat height and improved back support and provided patient with elevating leg rests due to BLE edema. Patient with improved sitting tolerance and upright posture noted in wheelchair. Patient propelled wheelchair back to room with distant supervision and left sitting in wheelchair with all needs in reach.    Treatment 2: Attempted to see patient for skilled physical therapy session but patient unable to participate due to  pain following paracentesis, reporting, "This was the worst one yet." Patient left semi reclined in bed with visitor at bedside and needs in reach. Patient missed 45 min. Will f/u as able per POC.   Therapy Documentation Precautions:  Precautions Precautions: Fall Restrictions Weight Bearing Restrictions: No General: PT Amount of Missed Time (min): 45 Minutes PT Missed Treatment Reason: Pain (after procedure) Pain: Pain Assessment Pain Assessment: 0-10 Pain Score: 8  Pain Type: Acute pain Pain Location: Leg Pain Orientation: Right Pain Descriptors / Indicators: Aching;Throbbing Pain Onset: On-going Pain Intervention(s): Repositioned;Rest;RN made aware   See Function Navigator for Current Functional Status.   Therapy/Group: Individual Therapy  [name], [name] 11/17/2015, 8:58 AM

## 2015-11-17 NOTE — Progress Notes (Signed)
100ml of serosanguineous drainage removed from urostomy bag placed over paracentesis site. Rudie MeyerEurillo, Dimonique Bourdeau A, RN

## 2015-11-17 NOTE — Progress Notes (Signed)
[facility] Individual Statement of Services  Patient Name:  [name]  Date:  11/17/2015  Welcome to the [facility].  Our goal is to provide you with an individualized program based on your diagnosis and situation, designed to meet your specific needs.  With this comprehensive rehabilitation program, you will be expected to participate in at least 3 hours of rehabilitation therapies Monday-Friday, with modified therapy programming on the weekends.  Your rehabilitation program will include the following services:  Physical Therapy (PT), Occupational Therapy (OT), 24 hour per day rehabilitation nursing, Neuropsychology, Case Management (Social Worker), Rehabilitation Medicine, Nutrition Services and Pharmacy Services  Weekly team conferences will be held on Tuesdays to discuss your progress.  Your Social Worker will talk with you frequently to get your input and to update you on team discussions.  Team conferences with you and your family in attendance may also be held.  Expected length of stay: 7 to 14 days  Overall anticipated outcome: 24/7 Supervision  Depending on your progress and recovery, your program may change. Your Social Worker will coordinate services and will keep you informed of any changes. Your Social Worker's name and contact numbers are listed  below.  The following services may also be recommended but are not provided by the [facility]:   Driving Evaluations  Home Health Rehabiltiation Services  Outpatient Rehabilitation Services  Vocational Rehabilitation   Arrangements will be made to provide these services after discharge if needed.  Arrangements include referral to agencies that provide these services.  Your insurance has been verified to be:  [employer] [facility] Dr. Joseph ArtWoods  Pertinent information will be shared with your doctor and your insurance  company.  Social Worker:  Staci AcostaJenny Atharva Mirsky, LCSW  [phone] or [phone]) [phone]  Information discussed with and copy given to patient by: Elvera LennoxPrevatt, Rebeka Kimble Capps, 11/17/2015, 10:29 AM

## 2015-11-18 ENCOUNTER — Inpatient Hospital Stay (HOSPITAL_COMMUNITY): Payer: Federal, State, Local not specified - PPO | Admitting: Physical Therapy

## 2015-11-18 ENCOUNTER — Inpatient Hospital Stay (HOSPITAL_COMMUNITY): Payer: Federal, State, Local not specified - PPO

## 2015-11-18 LAB — BASIC METABOLIC PANEL
Anion gap: 9 (ref 5–15)
BUN: 25 mg/dL — ABNORMAL HIGH (ref 6–20)
CALCIUM: 7.8 mg/dL — AB (ref 8.9–10.3)
CO2: 22 mmol/L (ref 22–32)
CREATININE: 1.6 mg/dL — AB (ref 0.61–1.24)
Chloride: 105 mmol/L (ref 101–111)
GFR calc Af Amer: 55 mL/min — ABNORMAL LOW (ref >60)
GFR calc non Af Amer: 48 mL/min — ABNORMAL LOW (ref >60)
GLUCOSE: 87 mg/dL (ref 65–99)
Potassium: 4.6 mmol/L (ref 3.5–5.1)
Sodium: 136 mmol/L (ref 135–145)

## 2015-11-18 LAB — GLUCOSE, CAPILLARY
GLUCOSE-CAPILLARY: 101 mg/dL — AB (ref 65–99)
GLUCOSE-CAPILLARY: 98 mg/dL (ref 65–99)
Glucose-Capillary: 82 mg/dL (ref 65–99)
Glucose-Capillary: 128 mg/dL — ABNORMAL HIGH (ref 65–99)

## 2015-11-18 MED ORDER — ALBUMIN HUMAN 25 % IV SOLN
50.0000 g | Freq: Once | INTRAVENOUS | Status: AC
  Administered 2015-11-18: 50 g via INTRAVENOUS
  Filled 2015-11-18: qty 200

## 2015-11-18 NOTE — Progress Notes (Signed)
Subjective/Complaints: Feeling better today. Has good appetite. Denies pain/nausea.   ROS: Pt denies fever, rash/itching, headache, blurred or double vision, chest pain, shortness of breath, palpitations, dysuria, dizziness, neck or back pain, bleeding, anxiety, or depression    Objective: Vital Signs: Blood pressure 102/57, pulse 94, temperature 99.5 F (37.5 C), temperature source Oral, resp. rate 18, height 6' (1.829 m), weight 100.4 kg (221 lb 5.5 oz), SpO2 99 %. US Paracentesis  11/17/2015  INDICATION: History of alcoholic cirrhosis with recurrent abdominal ascites. Request has been made for therapeutic paracentesis. EXAM: ULTRASOUND GUIDED LEFT LOWER QUADRANT PARACENTESIS MEDICATIONS: 1% Lidocaine. COMPLICATIONS: None immediate. PROCEDURE: Informed written consent was obtained from the patient after a discussion of the risks, benefits and alternatives to treatment. A timeout was performed prior to the initiation of the procedure. Initial ultrasound scanning demonstrates a large amount of ascites within the right lower abdominal quadrant. The right lower abdomen was prepped and draped in the usual sterile fashion. 1% lidocaine with epinephrine was used for local anesthesia. Following this, a 19 gauge, 7-cm, Yueh catheter was introduced. An ultrasound image was saved for documentation purposes. The paracentesis was performed. The catheter was removed and a dressing was applied. The patient tolerated the procedure well without immediate post procedural complication. FINDINGS: A total of approximately 4.8 Liters of clear yellow fluid was removed. IMPRESSION: Successful ultrasound-guided paracentesis yielding 4.8 liters of peritoneal fluid. Read by:  Gareth Eagle, PA-C Electronically Signed   By: Markus Daft M.D.   On: 11/17/2015 15:00   US Paracentesis  11/16/2015  INDICATION: History of alcoholic cirrhosis with recurrent abdominal ascites. Request has been made for therapeutic paracentesis. EXAM:  ULTRASOUND GUIDED THERAPEUTIC PARACENTESIS MEDICATIONS: 1% lidocaine COMPLICATIONS: None immediate. PROCEDURE: Informed written consent was obtained from the patient after a discussion of the risks, benefits and alternatives to treatment. A timeout was performed prior to the initiation of the procedure. Initial ultrasound scanning demonstrates a moderate amount of ascites within the left lower abdominal quadrant. The left lower abdomen was prepped and draped in the usual sterile fashion. 1% lidocaine was used for local anesthesia. Following this, a 19 gauge, 7-cm, Yueh catheter was introduced. An ultrasound image was saved for documentation purposes. The paracentesis was performed. The catheter was removed and a dressing was applied. The patient tolerated the procedure well without immediate post procedural complication. FINDINGS: A total of approximately 3 L of clear yellow fluid was removed. IMPRESSION: Successful ultrasound-guided paracentesis yielding 3 liters of peritoneal fluid. Read by: Saverio Danker, PA-C Electronically Signed   By: Corrie Mckusick D.O.   On: 11/16/2015 11:44   Results for orders placed or performed during the hospital encounter of 11/13/15 (from the past 72 hour(s))  Glucose, capillary     Status: Abnormal   Collection Time: 11/15/15 12:10 PM  Result Value Ref Range   Glucose-Capillary 113 (H) 65 - 99 mg/dL  Glucose, capillary     Status: Abnormal   Collection Time: 11/15/15  4:43 PM  Result Value Ref Range   Glucose-Capillary 105 (H) 65 - 99 mg/dL  Glucose, capillary     Status: Abnormal   Collection Time: 11/15/15  8:32 PM  Result Value Ref Range   Glucose-Capillary 111 (H) 65 - 99 mg/dL  C difficile quick scan w PCR reflex     Status: None   Collection Time: 11/16/15  1:38 AM  Result Value Ref Range   C Diff antigen NEGATIVE NEGATIVE   C Diff toxin NEGATIVE NEGATIVE   C  Diff interpretation Negative for toxigenic C. difficile   CBC WITH DIFFERENTIAL     Status: Abnormal    Collection Time: 11/16/15  5:46 AM  Result Value Ref Range   WBC 11.5 (H) 4.0 - 10.5 K/uL   RBC 3.00 (L) 4.22 - 5.81 MIL/uL   Hemoglobin 9.1 (L) 13.0 - 17.0 g/dL   HCT 27.6 (L) 39.0 - 52.0 %   MCV 92.0 78.0 - 100.0 fL   MCH 30.3 26.0 - 34.0 pg   MCHC 33.0 30.0 - 36.0 g/dL   RDW 21.2 (H) 11.5 - 15.5 %   Platelets 173 150 - 400 K/uL   Neutrophils Relative % 80 %   Lymphocytes Relative 8 %   Monocytes Relative 9 %   Eosinophils Relative 1 %   Basophils Relative 2 %   Neutro Abs 9.3 (H) 1.7 - 7.7 K/uL   Lymphs Abs 0.9 0.7 - 4.0 K/uL   Monocytes Absolute 1.0 0.1 - 1.0 K/uL   Eosinophils Absolute 0.1 0.0 - 0.7 K/uL   Basophils Absolute 0.2 (H) 0.0 - 0.1 K/uL   RBC Morphology TARGET CELLS     Comment: ACANTHOCYTES POLYCHROMASIA PRESENT TEARDROP CELLS   Comprehensive metabolic panel     Status: Abnormal   Collection Time: 11/16/15  5:46 AM  Result Value Ref Range   Sodium 137 135 - 145 mmol/L   Potassium 4.1 3.5 - 5.1 mmol/L   Chloride 103 101 - 111 mmol/L   CO2 23 22 - 32 mmol/L   Glucose, Bld 114 (H) 65 - 99 mg/dL   BUN 25 (H) 6 - 20 mg/dL   Creatinine, Ser 1.34 (H) 0.61 - 1.24 mg/dL   Calcium 8.3 (L) 8.9 - 10.3 mg/dL   Total Protein 7.6 6.5 - 8.1 g/dL   Albumin 2.1 (L) 3.5 - 5.0 g/dL   AST 62 (H) 15 - 41 U/L   ALT 35 17 - 63 U/L   Alkaline Phosphatase 84 38 - 126 U/L   Total Bilirubin 13.4 (H) 0.3 - 1.2 mg/dL   GFR calc non Af Amer 59 (L) >60 mL/min   GFR calc Af Amer >60 >60 mL/min    Comment: (NOTE) The eGFR has been calculated using the CKD EPI equation. This calculation has not been validated in all clinical situations. eGFR's persistently <60 mL/min signify possible Chronic Kidney Disease.    Anion gap 11 5 - 15  Glucose, capillary     Status: Abnormal   Collection Time: 11/16/15  6:43 AM  Result Value Ref Range   Glucose-Capillary 102 (H) 65 - 99 mg/dL  Ammonia     Status: None   Collection Time: 11/16/15  9:24 AM  Result Value Ref Range   Ammonia 34 9 -  35 umol/L  Glucose, capillary     Status: Abnormal   Collection Time: 11/16/15 11:40 AM  Result Value Ref Range   Glucose-Capillary 114 (H) 65 - 99 mg/dL  Glucose, capillary     Status: Abnormal   Collection Time: 11/16/15  4:37 PM  Result Value Ref Range   Glucose-Capillary 111 (H) 65 - 99 mg/dL  Glucose, capillary     Status: Abnormal   Collection Time: 11/16/15  9:42 PM  Result Value Ref Range   Glucose-Capillary 102 (H) 65 - 99 mg/dL  Glucose, capillary     Status: Abnormal   Collection Time: 11/17/15  6:38 AM  Result Value Ref Range   Glucose-Capillary 107 (H) 65 - 99 mg/dL  Glucose,  capillary     Status: None   Collection Time: 11/17/15 11:31 AM  Result Value Ref Range   Glucose-Capillary 86 65 - 99 mg/dL   Comment 1 Notify RN   Glucose, capillary     Status: None   Collection Time: 11/17/15  4:29 PM  Result Value Ref Range   Glucose-Capillary 86 65 - 99 mg/dL  Glucose, capillary     Status: None   Collection Time: 11/17/15  9:02 PM  Result Value Ref Range   Glucose-Capillary 98 65 - 99 mg/dL   Comment 1 Notify RN   Basic metabolic panel     Status: Abnormal   Collection Time: 11/18/15  5:32 AM  Result Value Ref Range   Sodium 136 135 - 145 mmol/L   Potassium 4.6 3.5 - 5.1 mmol/L   Chloride 105 101 - 111 mmol/L   CO2 22 22 - 32 mmol/L   Glucose, Bld 87 65 - 99 mg/dL   BUN 25 (H) 6 - 20 mg/dL   Creatinine, Ser 1.60 (H) 0.61 - 1.24 mg/dL   Calcium 7.8 (L) 8.9 - 10.3 mg/dL   GFR calc non Af Amer 48 (L) >60 mL/min   GFR calc Af Amer 55 (L) >60 mL/min    Comment: (NOTE) The eGFR has been calculated using the CKD EPI equation. This calculation has not been validated in all clinical situations. eGFR's persistently <60 mL/min signify possible Chronic Kidney Disease.    Anion gap 9 5 - 15  Glucose, capillary     Status: None   Collection Time: 11/18/15  6:35 AM  Result Value Ref Range   Glucose-Capillary 82 65 - 99 mg/dL     HEENT: icteric Cardio: RRR and no  murmurs Resp: CTA B/L and unlabored GI: Distention and ascites, abdomen less distended. Minimal pain today Extremity:  Edema edema in both thighs but minimal edema in the feet Skin:   Other jaundice Neuro: Alert/Oriented, Normal Sensory and Abnormal Motor 4/5 bilateral deltoid, biceps, triceps, grip, 2 minus hip flexor knee extensor 3 minus ankle dorsiflexor and plantar flexor Musc/Skel:  Normal Gen. no acute distress GU: scrotum/penile swelling sl decr   Assessment/Plan: 1. Functional deficits secondary to Debilitation secondary to respiratory failure/sepsis, complications from cirrhosis of liver/ascites. which require 3+ hours per day of interdisciplinary therapy in a comprehensive inpatient rehab setting. Physiatrist is providing close team supervision and 24 hour management of active medical problems listed below. Physiatrist and rehab team continue to assess barriers to discharge/monitor patient progress toward functional and medical goals. FIM: Function - Bathing Bathing activity did not occur: Refused Position: Wheelchair/chair at sink Body parts bathed by patient: Right arm, Left arm, Chest, Abdomen, Right upper leg, Left upper leg Body parts bathed by helper: Front perineal area, Buttocks, Right lower leg, Left lower leg, Back Assist Level: Touching or steadying assistance(Pt > 75%) (patient completed 60%, moderate assist)  Function- Upper Body Dressing/Undressing What is the patient wearing?: Pull over shirt/dress Pull over shirt/dress - Perfomed by patient: Thread/unthread right sleeve, Thread/unthread left sleeve, Put head through opening Pull over shirt/dress - Perfomed by helper: Pull shirt over trunk Assist Level: Supervision or verbal cues Function - Lower Body Dressing/Undressing What is the patient wearing?: Pants, Non-skid slipper socks Position: Wheelchair/chair at sink Underwear - Performed by helper: Thread/unthread right underwear leg, Thread/unthread left  underwear leg, Pull underwear up/down Pants- Performed by patient: Pull pants up/down, Thread/unthread left pants leg, Thread/unthread right pants leg Pants- Performed by helper: Pull pants up/down,  Thread/unthread left pants leg, Thread/unthread right pants leg Non-skid slipper socks- Performed by helper: Don/doff right sock, Don/doff left sock Shoes - Performed by helper: Don/doff right shoe, Don/doff left shoe, Fasten right, Fasten left Assist for footwear: Dependant Assist for lower body dressing: Touching or steadying assistance (Pt > 75%)  Function - Toileting Toileting activity did not occur: N/A Toileting steps completed by patient: Performs perineal hygiene Toileting steps completed by helper: Adjust clothing prior to toileting, Adjust clothing after toileting Toileting Assistive Devices: Grab bar or rail Assist level: Touching or steadying assistance (Pt.75%)  Function - Air cabin crew transfer activity did not occur: N/A Toilet transfer assistive device: Walker Assist level to toilet: Touching or steadying assistance (Pt > 75%) (per [place] Ingram, NT report) Assist level from toilet: Touching or steadying assistance (Pt > 75%)  Function - Chair/bed transfer Chair/bed transfer activity did not occur: N/A Chair/bed transfer method: Stand pivot Chair/bed transfer assist level: Supervision or verbal cues Chair/bed transfer assistive device: Walker Chair/bed transfer details: Tactile cues for sequencing, Tactile cues for weight shifting, Tactile cues for posture, Verbal cues for technique, Verbal cues for sequencing, Verbal cues for precautions/safety, Verbal cues for safe use of DME/AE, Visual cues for safe use of DME/AE  Function - Locomotion: Wheelchair Will patient use wheelchair at discharge?: Yes Type: Manual Max wheelchair distance: 100 Assist Level: Supervision or verbal cues Assist Level: Supervision or verbal cues Wheel 150 feet activity did not occur:  Safety/medical concerns Turns around,maneuvers to table,bed, and toilet,negotiates 3% grade,maneuvers on rugs and over doorsills: No Function - Locomotion: Ambulation Assistive device: Walker-rolling Max distance: 90 Assist level: Touching or steadying assistance (Pt > 75%) Assist level: Moderate assist (Pt 50 - 74%) Assist level: Touching or steadying assistance (Pt > 75%) Walk 150 feet activity did not occur: Safety/medical concerns Assist level: Moderate assist (Pt 50 - 74%)  Function - Comprehension Comprehension: Auditory Comprehension assist level: Follows basic conversation/direction with no assist  Function - Expression Expression: Verbal Expression assist level: Expresses basic needs/ideas: With no assist  Function - Social Interaction Social Interaction assist level: Interacts appropriately 90% of the time - Needs monitoring or encouragement for participation or interaction.  Function - Problem Solving Problem solving assist level: Solves basic 75 - 89% of the time/requires cueing 10 - 24% of the time  Function - Memory Memory assist level: Recognizes or recalls 50 - 74% of the time/requires cueing 25 - 49% of the time Patient normally able to recall (first 3 days only): Current season, Location of own room, Staff names and faces, That he or she is in a hospital    Medical Problem List and Plan: 1.  Debilitation secondary to respiratory failure/sepsis, complications from cirrhosis of liver/ascites. Status post paracentesis 11/02/2015             -continue therapies   2.  DVT Prophylaxis/Anticoagulation: SCDs. Monitor for any signs of DVT 3. Pain Management: Tylenol as needed 4. Anemia/thrombocytopenia question DIC related to cirrhosis. Status post transfused multiple units. Follow-up CBC CBC Latest Ref Rng 11/16/2015 11/12/2015 11/11/2015  WBC 4.0 - 10.5 K/uL 11.5(H) 13.3(H) 13.5(H)  Hemoglobin 13.0 - 17.0 g/dL 9.1(L) 8.6(L) 8.6(L)  Hematocrit 39.0 - 52.0 % 27.6(L)  26.3(L) 25.4(L)  Platelets 150 - 400 K/uL 173 149(L) 141(L)    5. Neuropsych: This patient is capable of making decisions on his own behalf. 6. Skin/Wound Care: Routine skin checks 7. Fluids/Electrolytes/Nutrition: encourage po as possible. Appetite better with rx of ascites 8. Hypertension. No  current antihypertensive medication. Follow with increased mobility 9. Acute renal insufficiency. Resolving. Follow-up chemistries essentially stable BMP Latest Ref Rng 11/18/2015 11/16/2015 11/13/2015  Glucose 65 - 99 mg/dL 87 114(H) 128(H)  BUN 6 - 20 mg/dL 25(H) 25(H) 28(H)  Creatinine 0.61 - 1.24 mg/dL 1.60(H) 1.34(H) 1.36(H)  Sodium 135 - 145 mmol/L 136 137 138  Potassium 3.5 - 5.1 mmol/L 4.6 4.1 3.2(L)  Chloride 101 - 111 mmol/L 105 103 104  CO2 22 - 32 mmol/L _0 Calcium 8.9 - 10.3 mg/dL 7.8(L) 8.3(L) 7.8(L)    10. Sinus tachycardia with SVT versus atrial fibrillation. Amiodarone 200 mg daily.  11. Liver failure with ascites:  Positive results with paracenteses   -app GI help  -aldactone added, lasix decreased--adjust further as needed/tolerated   -?TIPS      -LFT's fairly stable, ammonia level ok 12. Penile/scrotal edema----local care/lidocaine gel for comfort  -needs rx of #11  -continue foley for now  LOS (Days) 5 A FACE TO FACE EVALUATION WAS PERFORMED  SWARTZ,ZACHARY T 11/18/2015, 8:54 AM

## 2015-11-18 NOTE — Progress Notes (Signed)
Occupational Therapy Session Note  Patient Details  Name: [name] MRN: [id number] Date of Birth: 01/31/1961  Today's Date: 11/18/2015 OT Individual Time: 1100-1200 OT Individual Time Calculation (min): 60 min    Short Term Goals: Week 1:  OT Short Term Goal 1 (Week 1): STG= LTG  Skilled Therapeutic Interventions/Progress Updates:    Pt resting in w/c upon arrival.  Pt declined bathing and dressing this morning.  Pt stated that wearing pants was "too much trouble" when he used to toilet.  Pt propelled w/c to gym and initially engaged in dynamic standing tasks but c/o increased pain in RLE with activity.  Pt amb with RW to [employer] and engaged in BUE therex (10 mins X 2 at steady workload of 3). Pt required extended rest break between sets.  Pt returned to room and remained in w/c with all needs within reach.  Focus on standing balance, BUE strengthening, activity tolerance, and safety awareness to increase independence with BADLs.  Therapy Documentation Precautions:  Precautions Precautions: Fall Restrictions Weight Bearing Restrictions: No  Pain:  Pt c/o ongoing pain (8/10) in RLE; RN aware and repositioned.  See Function Navigator for Current Functional Status.   Therapy/Group: Individual Therapy  Rich BraveLanier, Tarek Chappell 11/18/2015, 12:23 PM

## 2015-11-18 NOTE — Progress Notes (Signed)
Daily Rounding Note  11/18/2015, 8:33 AM  LOS: 5 days   SUBJECTIVE:       No nausea.  One BM so far today.  No complaints Urine output 770 on 5/1,  850 on 5/2  OBJECTIVE:         Vital signs in last 24 hours:    Temp:  [99.5 F (37.5 C)] 99.5 F (37.5 C) (05/03 0450) Pulse Rate:  [94] 94 (05/03 0450) Resp:  [18] 18 (05/03 0450) BP: (102-123)/(57-84) 102/57 mmHg (05/03 0450) SpO2:  [99 %] 99 % (05/03 0450) Weight:  [100.4 kg (221 lb 5.5 oz)] 100.4 kg (221 lb 5.5 oz) (05/03 0455) Last BM Date: 11/17/15 Filed Weights   11/16/15 0538 11/17/15 0500 11/18/15 0455  Weight: 109.7 kg (241 lb 13.5 oz) 109.3 kg (240 lb 15.4 oz) 100.4 kg (221 lb 5.5 oz)   General: looks tired.  Comfortable.    Heart: RRR Chest: clear bil.   Abdomen: tense, less protuberant. NT.  BS active.   Scrotal edema.  Urine in foley is concentrated orange/yelllow but not amber Extremities: significant LE edema Neuro/Psych:  Oriented x 3.  Alert.  Calm.   Intake/Output from previous day: 05/02 0701 - 05/03 0700 In: 840 [P.O.:840] Out: 850 [Urine:850]  Intake/Output this shift:    Lab Results:  Recent Labs  11/16/15 0546  WBC 11.5*  HGB 9.1*  HCT 27.6*  PLT 173   BMET  Recent Labs  11/16/15 0546 11/18/15 0532  NA 137 136  K 4.1 4.6  CL 103 105  CO2 23 22  GLUCOSE 114* 87  BUN 25* 25*  CREATININE 1.34* 1.60*  CALCIUM 8.3* 7.8*   LFT  Recent Labs  11/16/15 0546  PROT 7.6  ALBUMIN 2.1*  AST 62*  ALT 35  ALKPHOS 84  BILITOT 13.4*   PT/INR No results for input(s): LABPROT, INR in the last 72 hours. Hepatitis Panel No results for input(s): HEPBSAG, HCVAB, HEPAIGM, HEPBIGM in the last 72 hours.  Studies/Results: US Paracentesis  11/17/2015  INDICATION: History of alcoholic cirrhosis with recurrent abdominal ascites. Request has been made for therapeutic paracentesis. EXAM: ULTRASOUND GUIDED LEFT LOWER QUADRANT PARACENTESIS  MEDICATIONS: 1% Lidocaine. COMPLICATIONS: None immediate. PROCEDURE: Informed written consent was obtained from the patient after a discussion of the risks, benefits and alternatives to treatment. A timeout was performed prior to the initiation of the procedure. Initial ultrasound scanning demonstrates a large amount of ascites within the right lower abdominal quadrant. The right lower abdomen was prepped and draped in the usual sterile fashion. 1% lidocaine with epinephrine was used for local anesthesia. Following this, a 19 gauge, 7-cm, Yueh catheter was introduced. An ultrasound image was saved for documentation purposes. The paracentesis was performed. The catheter was removed and a dressing was applied. The patient tolerated the procedure well without immediate post procedural complication. FINDINGS: A total of approximately 4.8 Liters of clear yellow fluid was removed. IMPRESSION: Successful ultrasound-guided paracentesis yielding 4.8 liters of peritoneal fluid. Read by:  Gareth Eagle, PA-C Electronically Signed   By: Markus Daft M.D.   On: 11/17/2015 15:00   US Paracentesis  11/16/2015  INDICATION: History of alcoholic cirrhosis with recurrent abdominal ascites. Request has been made for therapeutic paracentesis. EXAM: ULTRASOUND GUIDED THERAPEUTIC PARACENTESIS MEDICATIONS: 1% lidocaine COMPLICATIONS: None immediate. PROCEDURE: Informed written consent was obtained from the patient after a discussion of the risks, benefits and alternatives to treatment. A timeout was performed prior  to the initiation of the procedure. Initial ultrasound scanning demonstrates a moderate amount of ascites within the left lower abdominal quadrant. The left lower abdomen was prepped and draped in the usual sterile fashion. 1% lidocaine was used for local anesthesia. Following this, a 19 gauge, 7-cm, Yueh catheter was introduced. An ultrasound image was saved for documentation purposes. The paracentesis was performed. The catheter  was removed and a dressing was applied. The patient tolerated the procedure well without immediate post procedural complication. FINDINGS: A total of approximately 3 L of clear yellow fluid was removed. IMPRESSION: Successful ultrasound-guided paracentesis yielding 3 liters of peritoneal fluid. Read by: Saverio Danker, PA-C Electronically Signed   By: Corrie Mckusick D.O.   On: 11/16/2015 11:44    Scheduled Meds: . amiodarone  200 mg Oral Daily  . folic acid  1 mg Oral Daily  . furosemide  60 mg Oral Daily  . insulin aspart  0-9 Units Subcutaneous TID WC  . lactulose  30 g Oral Daily  . pantoprazole  40 mg Oral Daily  . potassium chloride  40 mEq Oral BID  . spironolactone  150 mg Oral Daily  . 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  Ascites, recurring after 3 paracentesis. 4th, 4.8 liter tap was 5/2.   Aldactone 150 added to Lasix 60 on 5/2.   Renal function bumped today.  Rising T bili but alk phos, AST/ALT all improved.  Note he is on Amiodarone as well (SVT vs Afib during acute illness, now NSR).  Abdominal discomfort and nausea resolved for now post tap.   * Coagulopathy.  No overt bleeding.    *  AKI.  Ongoing oliguria.    * Severe anemia, multifactorial, long-standing. Hgb stable, improved at 9.1. Though FOBT + on 4/15, no overt CG, blood per NGT and no melena or BPR. No evidence varices on CT scan. EGD deferred by Dr Carlean Purl.   * Protein cal malnutrition. Appetite improved.   * Encephalopathy, hepatic and metabolic. Max ammonia of just 57. Much improved MS c/w admission. Having 2 to 3 stools per day on Lactulose 30 gm daily. Marland Kitchen    PLAN   *  Would stay course with current diuretics as BUN/creat rise may dissipate, he did have paracentesis 2 days in a row without  receiving albumin. .  Would not tap pt again while we try to titrate diuretics,.  *  coags in AM. CMET in AM.        Azucena Freed  11/18/2015, 8:33 AM Pager: [phone]   Attending physician's note   I have taken an interval history, reviewed the chart and examined the patient. I agree with the Advanced Practitioner's note, impression and recommendations.  Elevated BUN and Cr s/p paracentesis. IV albumin 50 gm x1. F/u CMP and CBC.   Damaris Hippo, MD [phone] Mon-Fri 8a-5p [phone] after 5p, weekends, holidays

## 2015-11-18 NOTE — Progress Notes (Signed)
Physical Therapy Session Note  Patient Details  Name: [name] MRN: [id number] Date of Birth: 10/30/1960  Today's Date: 11/18/2015 PT Individual Time: 1001-1100 PT Individual Time Calculation (min): 59 min   Short Term Goals: Week 1:  PT Short Term Goal 1 (Week 1): Patient with perform sit<>stand with min A and LRAD.  PT Short Term Goal 2 (Week 1): Patient will ambulate with RW and min A for 150.  PT Short Term Goal 3 (Week 1): Patient will propell WC 1250ft at mod I level in controlled environment.   Skilled Therapeutic Interventions/Progress Updates:    Patient received supine in bed requesting to use restroom on bed pan. PT recommneded BSC, but Patient stated that he would rather use bed pan. Roll L for bed pan placement and removal x2. Roll R and L  X 3 each direction for hygiene with min A for roll. Roll R and left x 2 for brief placement. Supine to sit with min A from PT. Sit stand from bed with mod A from PT with RW. Stand pivot transfer with min A and RW with cues for Safe UE placement. WC mobility for 14300ft with supervision A and 1850ft with on rest break. PT provided min cues for improved BUE propulsion and improved success with obstacle negotiaiton. Gait training for 3390ft with min A and mod A x 1 to prevent anterior LOB. Patient noted to have increased forward trunk flexion compared to previous sessions and maintains wide BOS and decreased step length due to LE edema. Sit<>stand x 5 with rw and min A, as well as cues for improve weight shift. Standing marches with BUE support x 8 BLE. Patient returned to room and [facility] with call bell within reach.    Therapy Documentation Precautions:  Precautions Precautions: Fall Restrictions Weight Bearing Restrictions: No Pain: Pain Assessment Pain Assessment: 0-10 Pain Score: 6  Pain Location: Leg Pain Orientation: Right Pain Onset: On-going Patients Stated Pain Goal: 4 Pain Intervention(s): Medication (See eMAR)   See  Function Navigator for Current Functional Status.   Therapy/Group: Individual Therapy  [name] 11/18/2015, 6:29 PM

## 2015-11-18 NOTE — Progress Notes (Signed)
Physical Therapy Session Note  Patient Details  Name: [name] MRN: [id number] Date of Birth: 07/14/1961  Today's Date: 11/18/2015 PT Individual Time: 1500-1630 PT Individual Time Calculation (min): 90 min   Short Term Goals: Week 1:  PT Short Term Goal 1 (Week 1): Patient with perform sit<>stand with min A and LRAD.  PT Short Term Goal 2 (Week 1): Patient will ambulate with RW and min A for 150.  PT Short Term Goal 3 (Week 1): Patient will propell WC 12650ft at mod I level in controlled environment.   Skilled Therapeutic Interventions/Progress Updates:    Pt received in bed on bed pan. Pt reported 0/10 pain & pt able to roll L<>R with Min A & bed rails multiple times for perineal hygiene. PT observed wound & bleeding on pt's scrotum & notified RN. PT assisted pt with rolling L<>R to allow RN to apply dressing & medication to pt's wound. After bed mobility pt required rest break 2/2 fatigue but pt then able to transfer supine>sit with supervision, significantly extra time, and heavy use of bed rails with HOB elevated. PT provided total A for w/c set up by bed & pt required HHA to complete stand pivot transfer bed>w/c. Pt self propelled w/c with BUE x 230 ft with significantly extra time for cardiovascular endurance training. Pt transferred w/c>nu-step via stand pivot with HHA, as pt with great difficulty advancing RLE. Pt utilized nu-step Level 1 x 10 minutes, reporting 13 on Borg RPE scale, but did not require any rest breaks during task. Pt performed stand pivot nu-step>w/c in same manner as noted above. Pt transported back to room & left in w/c with BLE elevated on leg rests & all needs within reach. Throughout session pt required multiple rest breaks 2/2 fatigue with tasks.  Therapy Documentation Precautions:  Precautions Precautions: Fall Restrictions Weight Bearing Restrictions: No Pain: Pain Assessment Pain Assessment: No/denies pain   See Function Navigator for Current  Functional Status.   Therapy/Group: Individual Therapy  Sandi MariscalVictoria M Emony Dormer 11/18/2015, 3:57 PM

## 2015-11-19 ENCOUNTER — Inpatient Hospital Stay (HOSPITAL_COMMUNITY): Payer: Federal, State, Local not specified - PPO | Admitting: Occupational Therapy

## 2015-11-19 ENCOUNTER — Inpatient Hospital Stay (HOSPITAL_COMMUNITY): Payer: Federal, State, Local not specified - PPO

## 2015-11-19 ENCOUNTER — Encounter (HOSPITAL_COMMUNITY): Payer: Federal, State, Local not specified - PPO

## 2015-11-19 ENCOUNTER — Inpatient Hospital Stay (HOSPITAL_COMMUNITY): Payer: Federal, State, Local not specified - PPO | Admitting: Physical Therapy

## 2015-11-19 LAB — HEPATIC FUNCTION PANEL
ALBUMIN: 2.3 g/dL — AB (ref 3.5–5.0)
ALK PHOS: 60 U/L (ref 38–126)
ALT: 27 U/L (ref 17–63)
AST: 52 U/L — AB (ref 15–41)
BILIRUBIN TOTAL: 11.8 mg/dL — AB (ref 0.3–1.2)
Bilirubin, Direct: 4.1 mg/dL — ABNORMAL HIGH (ref 0.1–0.5)
Indirect Bilirubin: 7.7 mg/dL — ABNORMAL HIGH (ref 0.3–0.9)
Total Protein: 6.4 g/dL — ABNORMAL LOW (ref 6.5–8.1)

## 2015-11-19 LAB — BASIC METABOLIC PANEL
Anion gap: 8 (ref 5–15)
BUN: 27 mg/dL — AB (ref 6–20)
CHLORIDE: 104 mmol/L (ref 101–111)
CO2: 22 mmol/L (ref 22–32)
CREATININE: 1.68 mg/dL — AB (ref 0.61–1.24)
Calcium: 8.2 mg/dL — ABNORMAL LOW (ref 8.9–10.3)
GFR calc Af Amer: 52 mL/min — ABNORMAL LOW (ref >60)
GFR calc non Af Amer: 45 mL/min — ABNORMAL LOW (ref >60)
GLUCOSE: 98 mg/dL (ref 65–99)
POTASSIUM: 5.1 mmol/L (ref 3.5–5.1)
Sodium: 134 mmol/L — ABNORMAL LOW (ref 135–145)

## 2015-11-19 LAB — GLUCOSE, CAPILLARY
GLUCOSE-CAPILLARY: 153 mg/dL — AB (ref 65–99)
Glucose-Capillary: 108 mg/dL — ABNORMAL HIGH (ref 65–99)
Glucose-Capillary: 88 mg/dL (ref 65–99)
Glucose-Capillary: 115 mg/dL — ABNORMAL HIGH (ref 65–99)

## 2015-11-19 LAB — PROTIME-INR
INR: 2.71 — ABNORMAL HIGH (ref 0.00–1.49)
Prothrombin Time: 28.4 seconds — ABNORMAL HIGH (ref 11.6–15.2)

## 2015-11-19 MED ORDER — LORATADINE 10 MG PO TABS
10.0000 mg | ORAL_TABLET | Freq: Every day | ORAL | Status: DC
  Administered 2015-11-20 – 2015-11-30 (×11): 10 mg via ORAL
  Filled 2015-11-19 (×11): qty 1

## 2015-11-19 MED ORDER — LOPERAMIDE HCL 2 MG PO CAPS
2.0000 mg | ORAL_CAPSULE | Freq: Once | ORAL | Status: AC
  Administered 2015-11-19: 2 mg via ORAL
  Filled 2015-11-19: qty 1

## 2015-11-19 MED ORDER — NYSTATIN 100000 UNIT/GM EX POWD: Freq: Two times a day (BID) | CUTANEOUS | Status: DC

## 2015-11-19 MED ORDER — LACTULOSE 10 GM/15ML PO SOLN
30.0000 g | Freq: Every day | ORAL | Status: DC
  Administered 2015-11-21 – 2015-11-30 (×10): 30 g via ORAL
  Filled 2015-11-19 (×10): qty 45

## 2015-11-19 MED ORDER — NYSTATIN 100000 UNIT/GM EX POWD
Freq: Two times a day (BID) | CUTANEOUS | Status: DC
Start: 2015-11-19 — End: 2015-11-30
  Administered 2015-11-19 – 2015-11-30 (×20): via TOPICAL
  Filled 2015-11-19: qty 15

## 2015-11-19 MED ORDER — NYSTATIN 100000 UNIT/GM EX POWD
Freq: Two times a day (BID) | CUTANEOUS | Status: DC
  Filled 2015-11-19: qty 45

## 2015-11-19 MED ORDER — SACCHAROMYCES BOULARDII 250 MG PO CAPS
250.0000 mg | ORAL_CAPSULE | Freq: Two times a day (BID) | ORAL | Status: DC
  Administered 2015-11-19 – 2015-11-29 (×21): 250 mg via ORAL
  Filled 2015-11-19 (×22): qty 1

## 2015-11-19 NOTE — Progress Notes (Signed)
Physical Therapy Weekly Progress Note  Patient Details  Name: [name] MRN: [id number] Date of Birth: 08-22-1960  Beginning of progress report period: November 14, 2015 End of progress report period: Nov 20, 2015  Today's Date: 11/20/2015 PT Individual Time: 0905-1000 PT Individual Time Calculation (min): 55 min   Patient has met 2 of 3 short term goals.  Patient has made slow progress this reporting period due to increased RLE pain, deconditioning, and swelling impacting overall mobility. Patient can require up to mod A for sit > stand from low surfaces and S-min A using RW overall and requires frequent rest breaks. Patient and family education ongoing.   Patient continues to demonstrate the following deficits: abdominal and BLE edema, RLE pain, muscle weakness, muscle joint tightness, decreased cardiorespiratory endurance, decreased coordination, decreased problem solving, decreased safety awareness, decreased memory, decreased standing balance, decreased postural control, and decreased balance strategies and therefore will continue to benefit from skilled PT intervention to enhance overall performance with activity tolerance, balance, postural control, ability to compensate for deficits and functional use of  right upper extremity, right lower extremity, left upper extremity and left lower extremity.  Patient progressing toward long term goals.  Continue plan of care.  PT Short Term Goals Week 1:  PT Short Term Goal 1 (Week 1): Patient with perform sit<>stand with min A and LRAD.  PT Short Term Goal 1 - Progress (Week 1): Met PT Short Term Goal 2 (Week 1): Patient will ambulate with RW and min A for 150.  PT Short Term Goal 2 - Progress (Week 1): Met PT Short Term Goal 3 (Week 1): Patient will propell WC 133f at mod I level in controlled environment.  PT Short Term Goal 3 - Progress (Week 1): Progressing toward goal Week 2:  PT Short Term Goal 1 (Week 2): = LTGs due to anticipated  LOS  Skilled Therapeutic Interventions/Progress Updates:   Patient received in bed with brother present. Patient and brother report that patient has [place], discussed getting measurements from home and car seat height and brother verbalized understanding. Brother also reports they may install small ramp for threshold entry. Patient required increased time to transfer supine > sit with HOB flat and use of rails with verbal cues for technique with supervision. Gait training from room > nursing station using RW approx 100 ft with close supervision before requiring rest break. Patient propelled wheelchair using BUE rest of way to gym with supervision and verbal cues for efficient propulsion and turns. Patient participated in tabletop activity in standing with single LUE support on RW x 2.5 min + 4 min 45 sec with supervision. Patient required prolonged rest breaks with all mobility tasks due to fatigue and increased RLE pain. Instructed in seated LAQ x 20 each LE, seated marching x 10 each LE, and seated heel raises 4 x 10. Patient propelled wheelchair back to room with distant supervision and left sitting in wheelchair with all needs in reach.   Therapy Documentation Precautions:  Precautions Precautions: Fall Restrictions Weight Bearing Restrictions: No Pain: Pain Assessment Pain Assessment: 0-10 Pain Score: 8  Pain Type: Acute pain Pain Location: Leg Pain Orientation: Right Pain Descriptors / Indicators: Aching Pain Onset: On-going (worse with activity) Pain Intervention(s): Rest;RN made aware   See Function Navigator for Current Functional Status.  Therapy/Group: Individual Therapy  Bradley Alstrom5/11/2015, 9:54 AM

## 2015-11-19 NOTE — Progress Notes (Signed)
Occupational Therapy Note  Patient Details  Name: [name] MRN: [id number] Date of Birth: 08/17/1960  Today's Date: 11/19/2015 OT Individual Time: 1300-1400 OT Individual Time Calculation (min): 60 min   Pt c/o increased pain in LLE (6/10) with movement, repositioned Individual Therapy  Pt resting in w/c upon arrival.  Pt stated that he was exhausted, especially his legs, from morning therapies.  Pt agreeable to participate in therapy.  Pt propelled w/c from his room to therapy gym.  Pt initially engaged in sit<>stand and dynamic standing tasks to increase independence with BADLs and functional amb.  Pt c/o increase pain in RLE with activity/standing.  Pt transitioned to BUE therex with 4# weight bar and SciFit to increase overall endurance and BUE strength.  Pt returned to room and remained in w/c with all needs within reach.    [name], Jauan [facility] 11/19/2015, 2:01 PM

## 2015-11-19 NOTE — Progress Notes (Signed)
Social Work Patient ID: [name], male   DOB: 09/24/1960, 55 y.o.   MRN: [id number]   CSW spoke with pt's brother via telephone to update him on pt's targeted d/c date and pt's team conference discussion.  Family is talking about their availability to pt for 24/7 care and brother is looking into other options for pt, including services from the [facility].  Pt will most likely not have a SNF benefit through his Federal BCBC and CSW shared this with pt's brother.  He seemed to already know this and that is why he's looked into [facility] benefits in case pt does not meet supervision level goals and needs more rehab time.  Brother would also like to talk with Dr. Riley KillSwartz about pt's long term prognosis.  He will try to catch him on rounds and CSW will reach out to him to as well.  [name] works at [employer] and he has a good understanding of medical situations and case management.  He is the main contact for pt's family and he shares information with pt's dtrs who live out of town.  CSW to continue to follow and assist as needed.

## 2015-11-19 NOTE — Progress Notes (Signed)
Subjective/Complaints: Having multiple loose stools. (none recorded overnight however??). Feels nauseas   ROS: Pt denies fever, rash/itching, headache, blurred or double vision, chest pain, shortness of breath, palpitations, dysuria, dizziness, neck or back pain, bleeding, anxiety, or depression    Objective: Vital Signs: Blood pressure 106/67, pulse 90, temperature 99.5 F (37.5 C), temperature source Oral, resp. rate 18, height 6' (1.829 m), weight 99.8 kg (220 lb 0.3 oz), SpO2 100 %. US Paracentesis  11/17/2015  INDICATION: History of alcoholic cirrhosis with recurrent abdominal ascites. Request has been made for therapeutic paracentesis. EXAM: ULTRASOUND GUIDED LEFT LOWER QUADRANT PARACENTESIS MEDICATIONS: 1% Lidocaine. COMPLICATIONS: None immediate. PROCEDURE: Informed written consent was obtained from the patient after a discussion of the risks, benefits and alternatives to treatment. A timeout was performed prior to the initiation of the procedure. Initial ultrasound scanning demonstrates a large amount of ascites within the right lower abdominal quadrant. The right lower abdomen was prepped and draped in the usual sterile fashion. 1% lidocaine with epinephrine was used for local anesthesia. Following this, a 19 gauge, 7-cm, Yueh catheter was introduced. An ultrasound image was saved for documentation purposes. The paracentesis was performed. The catheter was removed and a dressing was applied. The patient tolerated the procedure well without immediate post procedural complication. FINDINGS: A total of approximately 4.8 Liters of clear yellow fluid was removed. IMPRESSION: Successful ultrasound-guided paracentesis yielding 4.8 liters of peritoneal fluid. Read by:  Gareth Eagle, PA-C Electronically Signed   By: Markus Daft M.D.   On: 11/17/2015 15:00   Results for orders placed or performed during the hospital encounter of 11/13/15 (from the past 72 hour(s))  Ammonia     Status: None    Collection Time: 11/16/15  9:24 AM  Result Value Ref Range   Ammonia 34 9 - 35 umol/L  Glucose, capillary     Status: Abnormal   Collection Time: 11/16/15 11:40 AM  Result Value Ref Range   Glucose-Capillary 114 (H) 65 - 99 mg/dL  Glucose, capillary     Status: Abnormal   Collection Time: 11/16/15  4:37 PM  Result Value Ref Range   Glucose-Capillary 111 (H) 65 - 99 mg/dL  Glucose, capillary     Status: Abnormal   Collection Time: 11/16/15  9:42 PM  Result Value Ref Range   Glucose-Capillary 102 (H) 65 - 99 mg/dL  Glucose, capillary     Status: Abnormal   Collection Time: 11/17/15  6:38 AM  Result Value Ref Range   Glucose-Capillary 107 (H) 65 - 99 mg/dL  Glucose, capillary     Status: None   Collection Time: 11/17/15 11:31 AM  Result Value Ref Range   Glucose-Capillary 86 65 - 99 mg/dL   Comment 1 Notify RN   Glucose, capillary     Status: None   Collection Time: 11/17/15  4:29 PM  Result Value Ref Range   Glucose-Capillary 86 65 - 99 mg/dL  Glucose, capillary     Status: None   Collection Time: 11/17/15  9:02 PM  Result Value Ref Range   Glucose-Capillary 98 65 - 99 mg/dL   Comment 1 Notify RN   Basic metabolic panel     Status: Abnormal   Collection Time: 11/18/15  5:32 AM  Result Value Ref Range   Sodium 136 135 - 145 mmol/L   Potassium 4.6 3.5 - 5.1 mmol/L   Chloride 105 101 - 111 mmol/L   CO2 22 22 - 32 mmol/L   Glucose, Bld 87 65 - 99  mg/dL   BUN 25 (H) 6 - 20 mg/dL   Creatinine, Ser 1.60 (H) 0.61 - 1.24 mg/dL   Calcium 7.8 (L) 8.9 - 10.3 mg/dL   GFR calc non Af Amer 48 (L) >60 mL/min   GFR calc Af Amer 55 (L) >60 mL/min    Comment: (NOTE) The eGFR has been calculated using the CKD EPI equation. This calculation has not been validated in all clinical situations. eGFR's persistently <60 mL/min signify possible Chronic Kidney Disease.    Anion gap 9 5 - 15  Glucose, capillary     Status: None   Collection Time: 11/18/15  6:35 AM  Result Value Ref Range    Glucose-Capillary 82 65 - 99 mg/dL  Glucose, capillary     Status: Abnormal   Collection Time: 11/18/15 12:13 PM  Result Value Ref Range   Glucose-Capillary 101 (H) 65 - 99 mg/dL  Glucose, capillary     Status: None   Collection Time: 11/18/15  4:41 PM  Result Value Ref Range   Glucose-Capillary 98 65 - 99 mg/dL  Glucose, capillary     Status: Abnormal   Collection Time: 11/18/15  9:51 PM  Result Value Ref Range   Glucose-Capillary 128 (H) 65 - 99 mg/dL  Basic metabolic panel     Status: Abnormal   Collection Time: 11/19/15  5:46 AM  Result Value Ref Range   Sodium 134 (L) 135 - 145 mmol/L   Potassium 5.1 3.5 - 5.1 mmol/L   Chloride 104 101 - 111 mmol/L   CO2 22 22 - 32 mmol/L   Glucose, Bld 98 65 - 99 mg/dL   BUN 27 (H) 6 - 20 mg/dL   Creatinine, Ser 1.68 (H) 0.61 - 1.24 mg/dL   Calcium 8.2 (L) 8.9 - 10.3 mg/dL   GFR calc non Af Amer 45 (L) >60 mL/min   GFR calc Af Amer 52 (L) >60 mL/min    Comment: (NOTE) The eGFR has been calculated using the CKD EPI equation. This calculation has not been validated in all clinical situations. eGFR's persistently <60 mL/min signify possible Chronic Kidney Disease.    Anion gap 8 5 - 15  Protime-INR     Status: Abnormal   Collection Time: 11/19/15  5:46 AM  Result Value Ref Range   Prothrombin Time 28.4 (H) 11.6 - 15.2 seconds   INR 2.71 (H) 0.00 - 1.49  Hepatic function panel     Status: Abnormal   Collection Time: 11/19/15  5:46 AM  Result Value Ref Range   Total Protein 6.4 (L) 6.5 - 8.1 g/dL   Albumin 2.3 (L) 3.5 - 5.0 g/dL   AST 52 (H) 15 - 41 U/L   ALT 27 17 - 63 U/L   Alkaline Phosphatase 60 38 - 126 U/L   Total Bilirubin 11.8 (H) 0.3 - 1.2 mg/dL   Bilirubin, Direct 4.1 (H) 0.1 - 0.5 mg/dL   Indirect Bilirubin 7.7 (H) 0.3 - 0.9 mg/dL  Glucose, capillary     Status: Abnormal   Collection Time: 11/19/15  6:52 AM  Result Value Ref Range   Glucose-Capillary 108 (H) 65 - 99 mg/dL     HEENT: icteric Cardio: RRR and no  murmurs Resp: CTA B/L and unlabored GI: Distention and ascites, abdomen less distended. Minimal pain today Extremity:  Edema edema in both thighs but minimal edema in the feet Skin:   Other jaundice Neuro: Alert/Oriented, Normal Sensory and Abnormal Motor 4/5 bilateral deltoid, biceps, triceps, grip, 2 minus  hip flexor knee extensor 3 minus ankle dorsiflexor and plantar flexor Musc/Skel:  Normal Gen. no acute distress GU: scrotum/penile swelling sl decr. Some irritation in groin   Assessment/Plan: 1. Functional deficits secondary to Debilitation secondary to respiratory failure/sepsis, complications from cirrhosis of liver/ascites. which require 3+ hours per day of interdisciplinary therapy in a comprehensive inpatient rehab setting. Physiatrist is providing close team supervision and 24 hour management of active medical problems listed below. Physiatrist and rehab team continue to assess barriers to discharge/monitor patient progress toward functional and medical goals. FIM: Function - Bathing Bathing activity did not occur: Refused Position: Wheelchair/chair at sink Body parts bathed by patient: Right arm, Left arm, Chest, Abdomen, Right upper leg, Left upper leg Body parts bathed by helper: Front perineal area, Buttocks, Right lower leg, Left lower leg, Back Assist Level: Touching or steadying assistance(Pt > 75%) (patient completed 60%, moderate assist)  Function- Upper Body Dressing/Undressing What is the patient wearing?: Pull over shirt/dress Pull over shirt/dress - Perfomed by patient: Thread/unthread right sleeve, Thread/unthread left sleeve, Put head through opening Pull over shirt/dress - Perfomed by helper: Pull shirt over trunk Assist Level: Supervision or verbal cues Function - Lower Body Dressing/Undressing What is the patient wearing?: Pants, Non-skid slipper socks Position: Wheelchair/chair at sink Underwear - Performed by helper: Thread/unthread right underwear leg,  Thread/unthread left underwear leg, Pull underwear up/down Pants- Performed by patient: Pull pants up/down, Thread/unthread left pants leg, Thread/unthread right pants leg Pants- Performed by helper: Pull pants up/down, Thread/unthread left pants leg, Thread/unthread right pants leg Non-skid slipper socks- Performed by helper: Don/doff right sock, Don/doff left sock Shoes - Performed by helper: Don/doff right shoe, Don/doff left shoe, Fasten right, Fasten left Assist for footwear: Dependant Assist for lower body dressing: Touching or steadying assistance (Pt > 75%)  Function - Toileting Toileting activity did not occur: N/A Toileting steps completed by patient: Performs perineal hygiene Toileting steps completed by helper: Adjust clothing prior to toileting, Adjust clothing after toileting Toileting Assistive Devices: Grab bar or rail Assist level: Touching or steadying assistance (Pt.75%)  Function - Air cabin crew transfer activity did not occur: N/A Toilet transfer assistive device: Walker Assist level to toilet: Touching or steadying assistance (Pt > 75%) (per [place], NT report) Assist level from toilet: Touching or steadying assistance (Pt > 75%)  Function - Chair/bed transfer Chair/bed transfer activity did not occur: N/A Chair/bed transfer method: Stand pivot Chair/bed transfer assist level: Touching or steadying assistance (Pt > 75%) Chair/bed transfer assistive device: Armrests, Walker Chair/bed transfer details: Verbal cues for sequencing, Verbal cues for technique, Verbal cues for precautions/safety, Verbal cues for safe use of DME/AE  Function - Locomotion: Wheelchair Will patient use wheelchair at discharge?: Yes Type: Manual Max wheelchair distance: 150 Assist Level: Supervision or verbal cues Assist Level: Supervision or verbal cues Wheel 150 feet activity did not occur: Safety/medical concerns Assist Level: Supervision or verbal cues Turns  around,maneuvers to table,bed, and toilet,negotiates 3% grade,maneuvers on rugs and over doorsills: No Function - Locomotion: Ambulation Assistive device: Walker-rolling Max distance: 27f Assist level: Touching or steadying assistance (Pt > 75%) Assist level: Touching or steadying assistance (Pt > 75%) Assist level: Touching or steadying assistance (Pt > 75%) Walk 150 feet activity did not occur: Safety/medical concerns Assist level: Moderate assist (Pt 50 - 74%)  Function - Comprehension Comprehension: Auditory Comprehension assist level: Follows basic conversation/direction with no assist  Function - Expression Expression: Verbal Expression assist level: Expresses basic needs/ideas: With no assist  Function -  Social Interaction Social Interaction assist level: Interacts appropriately 90% of the time - Needs monitoring or encouragement for participation or interaction.  Function - Problem Solving Problem solving assist level: Solves basic 75 - 89% of the time/requires cueing 10 - 24% of the time  Function - Memory Memory assist level: Recognizes or recalls 50 - 74% of the time/requires cueing 25 - 49% of the time Patient normally able to recall (first 3 days only): Current season, Location of own room, Staff names and faces, That he or she is in a hospital    Medical Problem List and Plan: 1.  Debilitation secondary to respiratory failure/sepsis, complications from cirrhosis of liver/ascites. Status post paracentesis 11/02/2015             -continue therapies   2.  DVT Prophylaxis/Anticoagulation: SCDs. Monitor for any signs of DVT 3. Pain Management: Tylenol as needed 4. Anemia/thrombocytopenia question DIC related to cirrhosis. Status post transfused multiple units. Follow-up CBC CBC Latest Ref Rng 11/16/2015 11/12/2015 11/11/2015  WBC 4.0 - 10.5 K/uL 11.5(H) 13.3(H) 13.5(H)  Hemoglobin 13.0 - 17.0 g/dL 9.1(L) 8.6(L) 8.6(L)  Hematocrit 39.0 - 52.0 % 27.6(L) 26.3(L) 25.4(L)   Platelets 150 - 400 K/uL 173 149(L) 141(L)    5. Neuropsych: This patient is capable of making decisions on his own behalf. 6. Skin/Wound Care: Routine skin checks 7. Fluids/Electrolytes/Nutrition: encourage po as possible. Appetite better with rx of ascites 8. Hypertension. No current antihypertensive medication. Follow with increased mobility 9. Acute renal insufficiency. Resolving. Follow-up chemistries essentially stable BMP Latest Ref Rng 11/19/2015 11/18/2015 11/16/2015  Glucose 65 - 99 mg/dL 98 87 114(H)  BUN 6 - 20 mg/dL 27(H) 25(H) 25(H)  Creatinine 0.61 - 1.24 mg/dL 1.68(H) 1.60(H) 1.34(H)  Sodium 135 - 145 mmol/L 134(L) 136 137  Potassium 3.5 - 5.1 mmol/L 5.1 4.6 4.1  Chloride 101 - 111 mmol/L 104 105 103  CO2 22 - 32 mmol/L _0 Calcium 8.9 - 10.3 mg/dL 8.2(L) 7.8(L) 8.3(L)    10. Sinus tachycardia with SVT versus atrial fibrillation. Amiodarone 200 mg daily.  11. Liver failure with ascites:  S/p numerous paracenteses  -app GI help  -aldactone added, lasix decreased--adjust further as needed/tolerated   -?TIPS      -LFT's fairly stable, ammonia level ok 12. Penile/scrotal edema----local care/lidocaine gel for comfort, nystatin for skin  -  rx of #11  -continue foley for now 13. Diarrhea  -hold lactulose x 1 day. Imodium prn  -abx already stopped  -diarrhea doesn't appear c/w c diff  -added probiotic  -appreciate any GI recs  LOS (Days) 6 A FACE TO FACE EVALUATION WAS PERFORMED  SWARTZ,ZACHARY T 11/19/2015, 8:33 AM

## 2015-11-19 NOTE — Progress Notes (Signed)
Social Work Patient ID: [name], male   DOB: [birth year], 55 y.o.   MRN: [id number]   CSW met with pt to discuss team conference discussion and targeted d/c date, but had to leave brother a message.  CSW to continue to reach out to brother to discuss the plan for pt at d/c.  CSW did tell pt that he would need 24/7 supervision.  CSW will assist as needed.

## 2015-11-19 NOTE — Plan of Care (Signed)
Problem: RH BLADDER ELIMINATION Goal: RH STG MANAGE BLADDER WITH ASSISTANCE STG Manage Bladder With mod Assistance  Outcome: Not Progressing Patient with coude cath

## 2015-11-19 NOTE — Progress Notes (Signed)
Physical Therapy Session Note  Patient Details  Name: [name] MRN: [id number] Date of Birth: 01/21/1961  Today's Date: 11/19/2015 PT Individual Time: 0900-1000 PT Individual Time Calculation (min): 60 min   Short Term Goals: Week 1:  PT Short Term Goal 1 (Week 1): Patient with perform sit<>stand with min A and LRAD.  PT Short Term Goal 2 (Week 1): Patient will ambulate with RW and min A for 150.  PT Short Term Goal 3 (Week 1): Patient will propell WC 14750ft at mod I level in controlled environment.   Skilled Therapeutic Interventions/Progress Updates:   Session focused on functional mobility training, functional strengthening, and activity tolerance. Patient in bed upon arrival, reporting no sleep due to using bathroom all night, MD aware. Patient transferred to edge of bed with HOB slightly raised and use of rail, improved sequencing with verbal cues for technique. Performed stand pivot transfer bed > wheelchair with steady assist and extra time using bed rails and arm rest. Patient brushed teeth from wheelchair level. Patient propelled wheelchair to gym using BUE with verbal cues for efficient technique x 200 ft with supervision and min questioning cues for path finding. Patient instructed in doffing ELRs with mod verbal cues and locking/unlocking brakes with supervision. Gait training using RW x 50 ft + 75 ft with min guard. Patient instructed in standing bimanual task of completing 2 pipetree puzzles of mild difficulty with RW in front for activity tolerance and supervision x 5 minutes before requiring rest break. Seated LAQ 2 x 10 each LE. Patient ambulated from gym back to room using RW with min guard x 230 ft with no rest breaks and no cues for path finding. Patient required frequent rest breaks throughout session due to fatigue and RLE pain. Patient left sitting in wheelchair with all needs in reach.    Therapy Documentation Precautions:  Precautions Precautions:  Fall Restrictions Weight Bearing Restrictions: No Pain: Pain Assessment Pain Assessment: 0-10 Pain Score: 8  Pain Type: Acute pain Pain Location: Leg Pain Orientation: Right Pain Descriptors / Indicators: Aching Pain Onset: On-going (worse with activity) Pain Intervention(s): Rest;Repositioned;Ambulation/increased activity   See Function Navigator for Current Functional Status.   Therapy/Group: Individual Therapy  [name], Anagabriela Jokerst A 11/19/2015, 9:58 AM

## 2015-11-19 NOTE — Patient Care Conference (Signed)
Inpatient RehabilitationTeam Conference and Plan of Care Update Date: 11/17/2015   Time: 2:30 PM    Patient Name: [name]      Medical Record Number: [id number]  Date of Birth: 1960/08/25 Sex: Male         Room/Bed: 4W01C/4W01C-01 Payor Info: Payor: [employer] / Plan: [employer] PPO / Product Type: *No Product type* /    Admitting Diagnosis: Debility  Admit Date/Time:  11/13/2015  5:50 PM Admission Comments: No comment available   Primary Diagnosis:  Debilitated Principal Problem: Debilitated  Patient Active Problem List   Diagnosis Date Noted  . Ascites   . Debilitated 11/13/2015  . Encephalopathy, hepatic (HCC)   . Goals of care, counseling/discussion   . Counseling regarding goals of care   . Decompensated liver disease (HCC)   . Anasarca   . Bleeding   . Palliative care encounter   . Absolute anemia   . Bilateral lower extremity edema   . Acute respiratory failure with hypoxia (HCC)   . AKI (acute kidney injury) (HCC)   . Alcoholic cirrhosis of liver with ascites (HCC)   . Altered mental status   . Elevated INR   . Lactic acidosis   . Subacute liver failure without hepatic coma   . Protein-calorie malnutrition, severe (HCC) 11/02/2015  . Shock (HCC) 10/31/2015  . Anemia 10/31/2015  . Cirrhosis (HCC) 10/31/2015  . Acute respiratory failure (HCC) 10/31/2015  . Pressure ulcer 10/31/2015  . Thrombocytopenia (HCC) 11/28/2014  . Allergic rhinitis 11/14/2014  . Arthritis 11/14/2014  . Cataracts, bilateral 11/14/2014  . Sleep apnea 11/14/2014  . HTN (hypertension) 11/14/2014  . Neuropathy (HCC) 11/14/2014  . Achilles tendon tear 11/14/2014  . Pedal edema 11/14/2014  . Renal failure 11/12/2013  . Syncope 11/12/2013    Expected Discharge Date: Expected Discharge Date: 12/01/15  Team Members Present: Physician leading conference: Dr. Faith Rogue Social Worker Present: Amada Jupiter, LCSW Nurse Present: Other (comment) Mosetta Pigeon, RN) PT  Present: Bayard Hugger, PT OT Present: Ardis Rowan, COTA;Aijalon Kirtz Katrinka Blazing, OT PPS Coordinator present : Tora Duck, RN, CRRN     Current Status/Progress Goal Weekly Team Focus  Medical   debilithy with encephalopathy, severe ascites. GI following. has been paracentesed twice. pain issue  improve edema and activity tolerance  ascites and anasarca management,    Bowel/Bladder   cont x2. LBM: 11/16/2015 pt is having several loose stools per day. visible blood in some stools. c.diff negative.   remain cont x2   continue with plan of care    Swallow/Nutrition/ Hydration             ADL's   BADLs-mod A/max A overall; functional transfers-min A  mod I/supervision overall  activity tolerance, functional transfers, activity tolernace, standing balance   Mobility   mod A from low surfaces, min A overall, deconditioned, RLE pain  mod I/supervision overall  functional mobility training, standing balance, activity tolerance, edema management, pt education   Communication             Safety/Cognition/ Behavioral Observations            Pain   verbalizes he has pain waste down, espeically in scrotum. lidocaine for foley care, and tylenol PRN. refuses tylenol most of the time.   <2  educate on PRN medications. Continue with tylenol PRN even though pt has severe liver issues?   Skin   paracentesis site draining still- urostomy pouch in place. BLE edema, and scrotum very swollen.  remain free of further breakdown and infection. reduce scrotum swelling.   assess parcentesis site and draininge, routine pressure relief measures, and monitor penis/scrotum swelling     Rehab Goals Patient on target to meet rehab goals: Yes Rehab Goals Revised: none - first conference *See Care Plan and progress notes for long and short-term goals.  Barriers to Discharge: etoh history, cirrhosis of liver    Possible Resolutions to Barriers:  diuresis, paracentesis, GI consult    Discharge Planning/Teaching Needs:   Pt's family is working out how to provide 24/7 supervision.  Family has been through therapy with pt and can come again closer to d/c.   Team Discussion:  Pt with cirrhosis, requiring ongoing paracentesis.  GI is following along with Dr. Riley KillSwartz.  Team is unsure what d/c support pt has at home and CSW to f/u with pt's brother regarding this.  All goals are set for 24/7 supervision.  Pt has pain in right leg with ambulation.  Pt is currently min/mod assist and is very deconditioned.  Revisions to Treatment Plan:  none   Continued Need for Acute Rehabilitation Level of Care: The patient requires daily medical management by a physician with specialized training in physical medicine and rehabilitation for the following conditions: Daily direction of a multidisciplinary physical rehabilitation program to ensure safe treatment while eliciting the highest outcome that is of practical value to the patient.: Yes Daily medical management of patient stability for increased activity during participation in an intensive rehabilitation regime.: Yes Daily analysis of laboratory values and/or radiology reports with any subsequent need for medication adjustment of medical intervention for : Neurological problems;Post surgical problems;Diabetes problems  [name], Vista DeckJennifer Carrillo 11/19/2015, 2:14 PM

## 2015-11-19 NOTE — Progress Notes (Signed)
Occupational Therapy Session Note  Patient Details  Name: [name] MRN: [id number] Date of Birth: 1961/01/10  Today's Date: 11/19/2015 OT Individual Time: 1100-1200 OT Individual Time Calculation (min): 60 min    Short Term Goals: Week 1:  OT Short Term Goal 1 (Week 1): STG= LTG    Skilled Therapeutic Interventions/Progress Updates:    Pt seen this session to facilitate functional mobility and activity tolerance with self care training. Pt declined donning clothing but did want to bathe. He was able to sit to stand several times from chair to sink all with S. Stood for several times with S to bathe self.  Donned hospital gown and needed A with socks. Pt used shampoo cap. He then engaged in UE AROM exercises. Educated pt on simple room exercise plan he can practice throughout the day. Pt in chair with all needs met.  Therapy Documentation Precautions:  Precautions Precautions: Fall Restrictions Weight Bearing Restrictions: No   Pain: Pain Assessment Pain Assessment: 0-10 Pain Score: 8  Pain Type: Acute pain Pain Location: Leg Pain Orientation: Right Pain Descriptors / Indicators: Aching Pain Onset: With Activity Pain Intervention(s): Repositioned ADL:   See Function Navigator for Current Functional Status.   Therapy/Group: Individual Therapy  [place] 11/19/2015, 1:01 PM

## 2015-11-19 NOTE — Progress Notes (Signed)
Occupational Therapy Session Note  Patient Details  Name: [name] MRN: [id number] Date of Birth: 01/09/1961  Today's Date: 11/19/2015 OT Individual Time: [id number] OT Individual Time Calculation (min): 30 min   Short Term Goals: Week 1:  OT Short Term Goal 1 (Week 1): STG= LTG  Skilled Therapeutic Interventions/Progress Updates:  Patient found seated in w/c with family in room. Pt with request to use restroom, however stated he has been using bed pan because toilet seat is too difficult for him to transfer on/off of. Therapist [facility] elevated toilet seat. Pt ambulated from w/c to BR for toilet transfer. Pt with + BM. Pt stood for peri cleaning. Pt then ambulated back to bed and transferred sit to supine with min assist, assistance needed for RLE management. At end of session, left pt supine in bed with all needs within reach and family in room. Notified RN of patient's +BM.   Therapy Documentation Precautions:  Precautions Precautions: Fall Restrictions Weight Bearing Restrictions: No  See Function Navigator for Current Functional Status.  Therapy/Group: Individual Therapy  Edwin CapPatricia Toren Tucholski , MS, OTR/L, CLT  11/19/2015, 4:14 PM

## 2015-11-20 ENCOUNTER — Inpatient Hospital Stay (HOSPITAL_COMMUNITY): Payer: Federal, State, Local not specified - PPO | Admitting: Physical Therapy

## 2015-11-20 ENCOUNTER — Inpatient Hospital Stay (HOSPITAL_COMMUNITY): Payer: Federal, State, Local not specified - PPO

## 2015-11-20 ENCOUNTER — Inpatient Hospital Stay (HOSPITAL_COMMUNITY): Payer: Federal, State, Local not specified - PPO | Admitting: Occupational Therapy

## 2015-11-20 LAB — BASIC METABOLIC PANEL
Anion gap: 11 (ref 5–15)
BUN: 27 mg/dL — ABNORMAL HIGH (ref 6–20)
CO2: 20 mmol/L — ABNORMAL LOW (ref 22–32)
Calcium: 8.2 mg/dL — ABNORMAL LOW (ref 8.9–10.3)
Chloride: 102 mmol/L (ref 101–111)
Creatinine, Ser: 1.52 mg/dL — ABNORMAL HIGH (ref 0.61–1.24)
GFR calc Af Amer: 59 mL/min — ABNORMAL LOW (ref >60)
GFR, EST NON AFRICAN AMERICAN: 51 mL/min — AB (ref >60)
Glucose, Bld: 87 mg/dL (ref 65–99)
POTASSIUM: 5.9 mmol/L — AB (ref 3.5–5.1)
SODIUM: 133 mmol/L — AB (ref 135–145)

## 2015-11-20 LAB — GLUCOSE, CAPILLARY
GLUCOSE-CAPILLARY: 102 mg/dL — AB (ref 65–99)
GLUCOSE-CAPILLARY: 149 mg/dL — AB (ref 65–99)
GLUCOSE-CAPILLARY: 95 mg/dL (ref 65–99)
GLUCOSE-CAPILLARY: 110 mg/dL — AB (ref 65–99)

## 2015-11-20 NOTE — Progress Notes (Signed)
Occupational Therapy Note  Patient Details  Name: [name] MRN: [id number] Date of Birth: 05/08/1961  Today's Date: 11/20/2015 OT Individual Time: 1400-1430 OT Individual Time Calculation (min): 30 min   Pt denied pain Individual Therapy  Pt resting in bed upon arrival but agreeable to participating in therapy.  Pt engaged in w/c mobility tasks including propelling of variety of surfaces and completing an obstacle course in the dayroom.  Pt completed all activities at supervision level but required more than a reasonable amount of time.  Focus on activity tolerance and w/c mobility to increase independence with BADLs.    [name], [name] 11/20/2015, 2:52 PM

## 2015-11-20 NOTE — Progress Notes (Signed)
Occupational Therapy Weekly Progress Note  Patient Details  Name: [name] MRN: [id number] Date of Birth: 10/09/1960  Beginning of progress report period: November 14, 2015 End of progress report period: Nov 20, 2015   LTGs downgraded by OTR (after discussion with this OTA) to supervision.  Pt progressing slowly but steadily with BADLs.  Pt requires more than a reasonable amount of time to complete all tasks with multiple rest breaks.   Patient continues to demonstrate the following deficits: decreased endurance, strength, decreased activity tolerance decreased balance, increased swelling in BLE.  and therefore will continue to benefit from skilled OT intervention to enhance overall performance with BADL and Reduce care partner burden.  Patient not progressing toward long term goals.  See goal revision..  Continue plan of care.  OT Short Term Goals Week 1:  OT Short Term Goal 1 (Week 1): STG= LTG OT Short Term Goal 1 - Progress (Week 1): Other (comment) (OTR downgraded LTGs to supervisoin) Week 2:  OT Short Term Goal 1 (Week 2): continue with downgraded LTG   Therapy Documentation Precautions:  Precautions Precautions: Fall Restrictions Weight Bearing Restrictions: No  See Function Navigator for Current Functional Status.   Therapy/Group: Individual Therapy  Rich BraveLanier, Sabien Chappell 11/20/2015, 3:34 PM

## 2015-11-20 NOTE — Progress Notes (Addendum)
Subjective/Complaints: Feeling better today. Only 1 stool overnight. A little more formed. Appetite good this morning. Tired from therapy. Denies any abdominal pain   ROS: Pt denies fever, rash/itching, headache, blurred or double vision, chest pain, shortness of breath, palpitations, dysuria, dizziness, neck or back pain, bleeding, anxiety, or depression    Objective: Vital Signs: Blood pressure 100/62, pulse 93, temperature 98.6 F (37 C), temperature source Oral, resp. rate 20, height 6' (1.829 m), weight 102.8 kg (226 lb 10.1 oz), SpO2 100 %. No results found. Results for orders placed or performed during the hospital encounter of 11/13/15 (from the past 72 hour(s))  Glucose, capillary     Status: None   Collection Time: 11/17/15 11:31 AM  Result Value Ref Range   Glucose-Capillary 86 65 - 99 mg/dL   Comment 1 Notify RN   Glucose, capillary     Status: None   Collection Time: 11/17/15  4:29 PM  Result Value Ref Range   Glucose-Capillary 86 65 - 99 mg/dL  Glucose, capillary     Status: None   Collection Time: 11/17/15  9:02 PM  Result Value Ref Range   Glucose-Capillary 98 65 - 99 mg/dL   Comment 1 Notify RN   Basic metabolic panel     Status: Abnormal   Collection Time: 11/18/15  5:32 AM  Result Value Ref Range   Sodium 136 135 - 145 mmol/L   Potassium 4.6 3.5 - 5.1 mmol/L   Chloride 105 101 - 111 mmol/L   CO2 22 22 - 32 mmol/L   Glucose, Bld 87 65 - 99 mg/dL   BUN 25 (H) 6 - 20 mg/dL   Creatinine, Ser 1.60 (H) 0.61 - 1.24 mg/dL   Calcium 7.8 (L) 8.9 - 10.3 mg/dL   GFR calc non Af Amer 48 (L) >60 mL/min   GFR calc Af Amer 55 (L) >60 mL/min    Comment: (NOTE) The eGFR has been calculated using the CKD EPI equation. This calculation has not been validated in all clinical situations. eGFR's persistently <60 mL/min signify possible Chronic Kidney Disease.    Anion gap 9 5 - 15  Glucose, capillary     Status: None   Collection Time: 11/18/15  6:35 AM  Result Value  Ref Range   Glucose-Capillary 82 65 - 99 mg/dL  Glucose, capillary     Status: Abnormal   Collection Time: 11/18/15 12:13 PM  Result Value Ref Range   Glucose-Capillary 101 (H) 65 - 99 mg/dL  Glucose, capillary     Status: None   Collection Time: 11/18/15  4:41 PM  Result Value Ref Range   Glucose-Capillary 98 65 - 99 mg/dL  Glucose, capillary     Status: Abnormal   Collection Time: 11/18/15  9:51 PM  Result Value Ref Range   Glucose-Capillary 128 (H) 65 - 99 mg/dL  Basic metabolic panel     Status: Abnormal   Collection Time: 11/19/15  5:46 AM  Result Value Ref Range   Sodium 134 (L) 135 - 145 mmol/L   Potassium 5.1 3.5 - 5.1 mmol/L   Chloride 104 101 - 111 mmol/L   CO2 22 22 - 32 mmol/L   Glucose, Bld 98 65 - 99 mg/dL   BUN 27 (H) 6 - 20 mg/dL   Creatinine, Ser 1.68 (H) 0.61 - 1.24 mg/dL   Calcium 8.2 (L) 8.9 - 10.3 mg/dL   GFR calc non Af Amer 45 (L) >60 mL/min   GFR calc Af Amer 52 (L) >  60 mL/min    Comment: (NOTE) The eGFR has been calculated using the CKD EPI equation. This calculation has not been validated in all clinical situations. eGFR's persistently <60 mL/min signify possible Chronic Kidney Disease.    Anion gap 8 5 - 15  Protime-INR     Status: Abnormal   Collection Time: 11/19/15  5:46 AM  Result Value Ref Range   Prothrombin Time 28.4 (H) 11.6 - 15.2 seconds   INR 2.71 (H) 0.00 - 1.49  Hepatic function panel     Status: Abnormal   Collection Time: 11/19/15  5:46 AM  Result Value Ref Range   Total Protein 6.4 (L) 6.5 - 8.1 g/dL   Albumin 2.3 (L) 3.5 - 5.0 g/dL   AST 52 (H) 15 - 41 U/L   ALT 27 17 - 63 U/L   Alkaline Phosphatase 60 38 - 126 U/L   Total Bilirubin 11.8 (H) 0.3 - 1.2 mg/dL   Bilirubin, Direct 4.1 (H) 0.1 - 0.5 mg/dL   Indirect Bilirubin 7.7 (H) 0.3 - 0.9 mg/dL  Glucose, capillary     Status: Abnormal   Collection Time: 11/19/15  6:52 AM  Result Value Ref Range   Glucose-Capillary 108 (H) 65 - 99 mg/dL  Glucose, capillary     Status:  None   Collection Time: 11/19/15 12:13 PM  Result Value Ref Range   Glucose-Capillary 88 65 - 99 mg/dL   Comment 1 Notify RN   Glucose, capillary     Status: Abnormal   Collection Time: 11/19/15  4:49 PM  Result Value Ref Range   Glucose-Capillary 115 (H) 65 - 99 mg/dL  Glucose, capillary     Status: Abnormal   Collection Time: 11/19/15  8:37 PM  Result Value Ref Range   Glucose-Capillary 153 (H) 65 - 99 mg/dL   Comment 1 Notify RN   Glucose, capillary     Status: Abnormal   Collection Time: 11/20/15  6:36 AM  Result Value Ref Range   Glucose-Capillary 102 (H) 65 - 99 mg/dL   Comment 1 Notify RN      HEENT: icteric Cardio: RRR and no murmurs Resp: CTA B/L and unlabored GI: Distention and ascites, abdomen overall less distended. Minimal pain today Extremity:  Edema edema in both thighs but minimal edema in the feet Skin:   Other jaundice Neuro: Alert/Oriented, Normal Sensory and Abnormal Motor 4/5 bilateral deltoid, biceps, triceps, grip, 2 minus hip flexor knee extensor 3 minus ankle dorsiflexor and plantar flexor Musc/Skel:  Normal Gen. no acute distress GU: scrotum/penile swelling sl decr. Some irritation in groin persists   Assessment/Plan: 1. Functional deficits secondary to Debilitation secondary to respiratory failure/sepsis, complications from cirrhosis of liver/ascites. which require 3+ hours per day of interdisciplinary therapy in a comprehensive inpatient rehab setting. Physiatrist is providing close team supervision and 24 hour management of active medical problems listed below. Physiatrist and rehab team continue to assess barriers to discharge/monitor patient progress toward functional and medical goals. FIM: Function - Bathing Bathing activity did not occur: Refused Position: Wheelchair/chair at sink Body parts bathed by patient: Right arm, Left arm, Chest, Abdomen, Right upper leg, Left upper leg, Front perineal area, Buttocks Body parts bathed by helper:  Right lower leg, Left lower leg, Back Assist Level: Supervision or verbal cues  Function- Upper Body Dressing/Undressing What is the patient wearing?: Hospital gown Pull over shirt/dress - Perfomed by patient: Thread/unthread right sleeve, Thread/unthread left sleeve, Put head through opening Pull over shirt/dress - Perfomed by  helper: Pull shirt over trunk Assist Level: Supervision or verbal cues Function - Lower Body Dressing/Undressing What is the patient wearing?: Hospital Gown, Non-skid slipper socks Position: Wheelchair/chair at sink Underwear - Performed by helper: Thread/unthread right underwear leg, Thread/unthread left underwear leg, Pull underwear up/down Pants- Performed by patient: Pull pants up/down, Thread/unthread left pants leg, Thread/unthread right pants leg Pants- Performed by helper: Pull pants up/down, Thread/unthread left pants leg, Thread/unthread right pants leg Non-skid slipper socks- Performed by helper: Don/doff right sock, Don/doff left sock Shoes - Performed by helper: Don/doff right shoe, Don/doff left shoe, Fasten right, Fasten left Assist for footwear: Dependant Assist for lower body dressing: Touching or steadying assistance (Pt > 75%)  Function - Toileting Toileting activity did not occur: N/A Toileting steps completed by patient: Performs perineal hygiene Toileting steps completed by helper: Adjust clothing prior to toileting, Adjust clothing after toileting Toileting Assistive Devices: Grab bar or rail Assist level: Touching or steadying assistance (Pt.75%)  Function - Air cabin crew transfer activity did not occur: N/A Toilet transfer assistive device: Walker Assist level to toilet: Touching or steadying assistance (Pt > 75%) (per [place] Ingram, NT report) Assist level from toilet: Touching or steadying assistance (Pt > 75%)  Function - Chair/bed transfer Chair/bed transfer activity did not occur: N/A Chair/bed transfer method: Stand  pivot Chair/bed transfer assist level: Touching or steadying assistance (Pt > 75%) Chair/bed transfer assistive device: Armrests, Bedrails Chair/bed transfer details: Verbal cues for sequencing, Verbal cues for technique, Verbal cues for precautions/safety, Verbal cues for safe use of DME/AE  Function - Locomotion: Wheelchair Will patient use wheelchair at discharge?: Yes Type: Manual Max wheelchair distance: 200  Assist Level: Supervision or verbal cues Assist Level: Supervision or verbal cues Wheel 150 feet activity did not occur: Safety/medical concerns Assist Level: Supervision or verbal cues Turns around,maneuvers to table,bed, and toilet,negotiates 3% grade,maneuvers on rugs and over doorsills: No Function - Locomotion: Ambulation Assistive device: Walker-rolling Max distance: 230 ft Assist level: Touching or steadying assistance (Pt > 75%) Assist level: Touching or steadying assistance (Pt > 75%) Assist level: Touching or steadying assistance (Pt > 75%) Walk 150 feet activity did not occur: Safety/medical concerns Assist level: Touching or steadying assistance (Pt > 75%) Assist level: Moderate assist (Pt 50 - 74%)  Function - Comprehension Comprehension: Auditory Comprehension assist level: Understands complex 90% of the time/cues 10% of the time  Function - Expression Expression: Verbal Expression assist level: Expresses basic 90% of the time/requires cueing < 10% of the time.  Function - Social Interaction Social Interaction assist level: Interacts appropriately 90% of the time - Needs monitoring or encouragement for participation or interaction.  Function - Problem Solving Problem solving assist level: Solves basic 75 - 89% of the time/requires cueing 10 - 24% of the time  Function - Memory Memory assist level: Recognizes or recalls 50 - 74% of the time/requires cueing 25 - 49% of the time Patient normally able to recall (first 3 days only): Current season, Location  of own room, Staff names and faces, That he or she is in a hospital    Medical Problem List and Plan: 1.  Debilitation secondary to respiratory failure/sepsis, complications from cirrhosis of liver/ascites. Status post paracentesis 11/02/2015             -continue therapies---he continues to work through GI symptoms 2.  DVT Prophylaxis/Anticoagulation: SCDs. Monitor for any signs of DVT 3. Pain Management: Tylenol as needed 4. Anemia/thrombocytopenia question DIC related to cirrhosis. Status post transfused  multiple units. Follow-up CBC CBC Latest Ref Rng 11/16/2015 11/12/2015 11/11/2015  WBC 4.0 - 10.5 K/uL 11.5(H) 13.3(H) 13.5(H)  Hemoglobin 13.0 - 17.0 g/dL 9.1(L) 8.6(L) 8.6(L)  Hematocrit 39.0 - 52.0 % 27.6(L) 26.3(L) 25.4(L)  Platelets 150 - 400 K/uL 173 149(L) 141(L)    5. Neuropsych: This patient is capable of making decisions on his own behalf. 6. Skin/Wound Care: Routine skin checks 7. Fluids/Electrolytes/Nutrition: encourage po as possible. Appetite better with rx of ascites 8. Hypertension. No current antihypertensive medication. Follow with increased mobility 9. Acute renal insufficiency. BUN/Cr slightly increased due to diuretics  -bmet pending today  BMP Latest Ref Rng 11/19/2015 11/18/2015 11/16/2015  Glucose 65 - 99 mg/dL 98 87 114(H)  BUN 6 - 20 mg/dL 27(H) 25(H) 25(H)  Creatinine 0.61 - 1.24 mg/dL 1.68(H) 1.60(H) 1.34(H)  Sodium 135 - 145 mmol/L 134(L) 136 137  Potassium 3.5 - 5.1 mmol/L 5.1 4.6 4.1  Chloride 101 - 111 mmol/L 104 105 103  CO2 22 - 32 mmol/L 22 22 23   Calcium 8.9 - 10.3 mg/dL 8.2(L) 7.8(L) 8.3(L)    10. Sinus tachycardia with SVT versus atrial fibrillation. Amiodarone 200 mg daily.  11. Liver failure with ascites:  S/p numerous paracenteses  -apprec GI help  -aldactone added, lasix decreased--adjust further as needed/tolerated   -?TIPS  -held lactulose x 1 dose     -LFT's fairly stable, ammonia level ok  -repeat paracentesis per GI 12. Penile/scrotal  edema----local care/lidocaine gel for comfort, nystatin for skin  -  rx of #11  -continue foley for now as edema resolves 13. Diarrhea  -stool more formed  -abx have already been stopped  -diarrhea doesn'Carrillo appear c/w c diff  -added probiotic  -held lactulose x 1 dose  -appreciate any GI recs  LOS (Days) 7 A FACE TO FACE EVALUATION WAS PERFORMED  [name] 11/20/2015, 8:38 AM

## 2015-11-20 NOTE — Progress Notes (Signed)
Daily Rounding Note  11/20/2015, 10:08 AM  LOS: 7 days   SUBJECTIVE:       Urine output of 1.35 liters yesterday, which is steady improvement. Recalls 1 BM last night.   Pain in right hip and leg.  Able to ambulate a bit but right hip and leg are hurting.  Also SOB with walking.  Brother says overall physical condition is better than in weeks PTA when he could not get out of bed. No nausea and appetite better.  Eating 75 to 100% of meals.  Abdominal pain mostly resolved, still feels a bit of tension and wondering if he should get tapped again.  Weight up 220 to 226 in last 24 hours.    OBJECTIVE:         Vital signs in last 24 hours:    Temp:  [98.6 F (37 C)-98.7 F (37.1 C)] 98.6 F (37 C) (05/05 0446) Pulse Rate:  [76-93] 93 (05/05 0446) Resp:  [18-20] 20 (05/05 0446) BP: (100-107)/(62-75) 100/62 mmHg (05/05 0446) SpO2:  [100 %] 100 % (05/05 0446) Weight:  [102.8 kg (226 lb 10.1 oz)] 102.8 kg (226 lb 10.1 oz) (05/05 0446) Last BM Date: 11/19/15 Filed Weights   11/18/15 1251 11/19/15 0533 11/20/15 0446  Weight: 99.927 kg (220 lb 4.8 oz) 99.8 kg (220 lb 0.3 oz) 102.8 kg (226 lb 10.1 oz)   General: looks jaundiced, chronically ill   Heart: RRR Chest: clear bil.  Dyspnea with effort (even just sitting up to side of bed) Abdomen: protuberant, NT, slightly tense though better than earlier this week.  BS present  Extremities: + bil LE edema/anasarca Neuro/Psych:  Oriented x 3.  Fine resting UE tremor, no asterixis.  Not somnolent.  Calm.   Intake/Output from previous day: 05/04 0701 - 05/05 0700 In: 960 [P.O.:960] Out: 1350 [Urine:1350]  Intake/Output this shift:    Lab Results: No results for input(s): WBC, HGB, HCT, PLT in the last 72 hours. BMET  Recent Labs  11/18/15 0532 11/19/15 0546 11/20/15 0759  NA 136 134* 133*  K 4.6 5.1 5.9*  CL 105 104 102  CO2 22 22 20*  GLUCOSE 87 98 87  BUN 25* 27* 27*    CREATININE 1.60* 1.68* 1.52*  CALCIUM 7.8* 8.2* 8.2*   LFT  Recent Labs  11/19/15 0546  PROT 6.4*  ALBUMIN 2.3*  AST 52*  ALT 27  ALKPHOS 60  BILITOT 11.8*  BILIDIR 4.1*  IBILI 7.7*   PT/INR  Recent Labs  11/19/15 0546  LABPROT 28.4*  INR 2.71*   Hepatitis Panel No results for input(s): HEPBSAG, HCVAB, HEPAIGM, HEPBIGM in the last 72 hours.  Studies/Results: No results found.   Scheduled Meds: . amiodarone  200 mg Oral Daily  . folic acid  1 mg Oral Daily  . furosemide  60 mg Oral Daily  . insulin aspart  0-9 Units Subcutaneous TID WC  . [START ON 11/21/2015] lactulose  30 g Oral Daily  . loratadine  10 mg Oral Daily  . nystatin   Topical BID  . pantoprazole  40 mg Oral Daily  . potassium chloride  40 mEq Oral BID  . saccharomyces boulardii  250 mg Oral BID  . spironolactone  150 mg Oral Daily  . 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 Hep ABC negative.  Ascites, s/p paracentesis x 4, latest (4.8 liter tap) was 5/2.  Aldactone 150 added to Lasix 60 on 5/2. Renal function improved.   * Coagulopathy. No overt bleeding.   * AKI.  Oliguria improved.  Renal function slightly improved.   * Severe anemia, multifactorial, long-standing. Though FOBT + on 4/15, no overt CG, blood per NGT and no melena or BPR. No evidence varices on CT scan. EGD deferred by Dr Leone Payor.   * Protein cal malnutrition. Appetite improved.    * Encephalopathy, hepatic and metabolic. Max ammonia of just 57. Much improved MS c/w admission. Having 2 to 3 stools per day on Lactulose 30 gm daily.     PLAN   *  Daily weights. Daily chemistries.  Recheck coags, LFTs on 5/8.   Will check back early next week.    *  Will eventually need outpt EGD.  Has follow up with Dr Rhea Belton set for 5/23.     Jennye Moccasin  11/20/2015, 10:08 AM Pager: [phone]   Attending physician's note   I have taken an interval history, reviewed the chart and examined the patient. I agree with the Advanced Practitioner's note, impression and recommendations. Cr stable. Continue current dose of diuretics and continue to monitor renal function and fluid status. If kidney function continues to remain stable, will consider further increasing the diuretic dose in next few days.   Iona Beard, MD [phone] Mon-Fri 8a-5p [phone] after 5p, weekends, holidays

## 2015-11-20 NOTE — Progress Notes (Signed)
Occupational Therapy Session Note  Patient Details  Name: [name] MRN: [id number] Date of Birth: 04/23/1961  Today's Date: 11/20/2015 OT Individual Time: 1100-1200 OT Individual Time Calculation (min): 60 min    Short Term Goals: Week 1:  OT Short Term Goal 1 (Week 1): STG= LTG  Skilled Therapeutic Interventions/Progress Updates:    Pt resting in w/c upon arrival.  Pt declined bathing or changing hospital gown this morning.  Pt does not currently wear clothing secondary to discomfort secondary to LB edema.  Focus on dynamic standing tasks to increase independence with BADLs at sink.  Pt stated that he completed bathing tasks at home while standing at sink and taking seated rest breaks as needed.  Pt transitioned to BUE therex with 2kg ball and theraband.  Focus on increased activity tolerance and BUE strength to assist with functional transfers and increase endurance.   Therapy Documentation Precautions:  Precautions Precautions: Fall Restrictions Weight Bearing Restrictions: No   Pain: Pain Assessment Pain Assessment: 0-10 Pain Score: Asleep Pain Type: Acute pain Pain Location: Leg Pain Orientation: Right Pain Descriptors / Indicators: Aching Pain Frequency: Intermittent Pain Onset: On-going Patients Stated Pain Goal: 4 Pain Intervention(s):RN aware and admin meds prior to therapy; repositioned See Function Navigator for Current Functional Status.   Therapy/Group: Individual Therapy  Rich BraveLanier, Aeron Chappell 11/20/2015, 12:12 PM

## 2015-11-20 NOTE — Progress Notes (Signed)
Physical Therapy Session Note  Patient Details  Name: Julio Zappia MRN: [id number] Date of Birth: [birth year]  Today's Date: 11/20/2015 PT Individual Time: 1615-1700 PT Individual Time Calculation (min): 45 min   Short Term Goals: Week 1:  PT Short Term Goal 1 (Week 1): Patient with perform sit<>stand with min A and LRAD.  PT Short Term Goal 1 - Progress (Week 1): Met PT Short Term Goal 2 (Week 1): Patient will ambulate with RW and min A for 150.  PT Short Term Goal 2 - Progress (Week 1): Met PT Short Term Goal 3 (Week 1): Patient will propell WC 18f at mod I level in controlled environment.  PT Short Term Goal 3 - Progress (Week 1): Progressing toward goal Week 2:  PT Short Term Goal 1 (Week 2): = LTGs due to anticipated LOS  Skilled Therapeutic Interventions/Progress Updates:    [facility] Patient performed WC mobility with supervision A from PT for 1540f min cues provided for improved UE ROM to increase mechanical advantage with propulsion.  Patient performed WC<>mat table with RW and min A from PT, min cues provide for UE positioning and increased safety with movement. Patient reports feeling significant LE fatigue from therapy today and reports significant pain/tightness in his RLE. Sit<>supine transfer performed with supervision A form PT with cues for LE placement and proper sequencing.   PT performed manual therapy for improved ROM for BLE including PNF contract relax HS stretch x 4 BLE. PNF contract relax for R LE piriformis x 3. And PNF contract relax for R LE hip adductors.  Patient reports decreased pain and stiffness following manual therapy.   [facility] stand<>pivot transfer to bed with min A from PT. And then performed sit>supine transfer into bed with supervision A and cues for LE and pelvic placement.   Call bell left within reach at end of session.   Therapy Documentation Precautions:   Precautions Precautions: Fall Restrictions Weight Bearing Restrictions: No General:   Vital Signs: Therapy Vitals Temp: 99.7 F (37.6 C) Temp Source: Oral Pulse Rate: 93 Resp: 18 BP: 107/70 mmHg Patient Position (if appropriate): Sitting Oxygen Therapy SpO2: 100 % O2 Device: Not Delivered Pain: Pain Assessment Pain Assessment: 0-10 Pain Score: 8  Pain Type: Acute pain Pain Location: Leg Pain Orientation: Right Pain Descriptors / Indicators: Aching Pain Frequency: Intermittent Pain Onset: On-going Patients Stated Pain Goal: 4 Pain Intervention(s): Medication (See eMAR)   See Function Navigator for Current Functional Status.   Therapy/Group: Individual Therapy  AuLorie Phenix/11/2015, 5:59 PM

## 2015-11-20 NOTE — Progress Notes (Signed)
Occupational Therapy Session Note  Patient Details  Name: [name] MRN: [id number] Date of Birth: 07/17/1961  Today's Date: 11/20/2015 OT Individual Time: 1500-1545  (45 min) Short Term Goals: Week 1:  OT Short Term Goal 1 (Week 1): STG= LTG OT Short Term Goal 1 - Progress (Week 1): Other (comment) (OTR downgraded LTGs to supervisoin) Week 2:  OT Short Term Goal 1 (Week 2): continue with downgraded LTG  Skilled Therapeutic Interventions/Progress Updates:    Pt. Engaged in sit to stand , standing balance, functional mobility.  Utilized parallell bars to ambulate for forward Doran Durand/backward /turns x2.  Used yellow ball for UE therapeutic activity.  Practiced unsupported sitting for 2 minutes during UE exercises. Propell wc about 20 feet.  Returned to room and left with all needs in reach.    Therapy Documentation Precautions:  Precautions Precautions: Fall Restrictions Weight Bearing Restrictions: No    Pain:  8/10 RN aware      See Function Navigator for Current Functional Status.   Therapy/Group: Individual Therapy  [name], [name] 11/20/2015, 3:50 PM 0

## 2015-11-21 ENCOUNTER — Inpatient Hospital Stay (HOSPITAL_COMMUNITY): Payer: Federal, State, Local not specified - PPO | Admitting: Occupational Therapy

## 2015-11-21 LAB — BASIC METABOLIC PANEL
Anion gap: 11 (ref 5–15)
BUN: 29 mg/dL — AB (ref 6–20)
CALCIUM: 8.2 mg/dL — AB (ref 8.9–10.3)
CHLORIDE: 101 mmol/L (ref 101–111)
CO2: 19 mmol/L — AB (ref 22–32)
CREATININE: 1.6 mg/dL — AB (ref 0.61–1.24)
GFR calc non Af Amer: 48 mL/min — ABNORMAL LOW (ref >60)
GFR, EST AFRICAN AMERICAN: 55 mL/min — AB (ref >60)
GLUCOSE: 96 mg/dL (ref 65–99)
Potassium: 5.4 mmol/L — ABNORMAL HIGH (ref 3.5–5.1)
Sodium: 131 mmol/L — ABNORMAL LOW (ref 135–145)

## 2015-11-21 LAB — GLUCOSE, CAPILLARY
GLUCOSE-CAPILLARY: 113 mg/dL — AB (ref 65–99)
GLUCOSE-CAPILLARY: 109 mg/dL — AB (ref 65–99)
Glucose-Capillary: 94 mg/dL (ref 65–99)
Glucose-Capillary: 97 mg/dL (ref 65–99)

## 2015-11-21 NOTE — Progress Notes (Signed)
Occupational Therapy Session Note  Patient Details  Name: [name] MRN: [id number] Date of Birth: 10/04/1960  Today's Date: 11/21/2015 OT Individual Time: [id number] OT Individual Time Calculation (min): 60 min    Short Term Goals: Week 1:  OT Short Term Goal 1 (Week 1): STG= LTG OT Short Term Goal 1 - Progress (Week 1): Other (comment) (OTR downgraded LTGs to supervisoin)  Skilled Therapeutic Interventions/Progress Updates:  Patient needed to have a BM and took quite a bit of time to complete.   His w/c to /fr 3:1 transfer was completed with close supervision.  Toileting he completed with close supervision.   UB bathing and dressing with set up; LB bathing and dressing with CGA - he elected not to wash feet or change tredded socks today.  Patient completed grooming with setup.   He was able to complete mild standing balance activities such as pericleansing at toilet and washing periarea in w/c at sink with Fair dynamic balance     Therapy Documentation Precautions:  Precautions Precautions: Fall Restrictions Weight Bearing Restrictions: No   Pain: To this clinician and the RN he c/o in"stomach but I think it needs to be emptied"  RN in the process of addressing his concern during the session    See Function Navigator for Current Functional Status.   Therapy/Group: Individual Therapy  Bud Faceickett, Akima Slaugh [facility] HospitalYe[facility] 11/21/2015, 1:46 PM

## 2015-11-21 NOTE — Progress Notes (Addendum)
Subjective/Complaints: Patient sitting up in bed this morning with significant music. He states he slept well overnight and does not have complaints at pleasant  ROS: Denies CP, SOB, nausea, vomiting, diarrhea.  Objective: Vital Signs: Blood pressure 110/70, pulse 95, temperature 99.2 F (37.3 C), temperature source Oral, resp. rate 18, height 6' (1.829 m), weight 100.6 kg (221 lb 12.5 oz), SpO2 100 %. No results found. Results for orders placed or performed during the hospital encounter of 11/13/15 (from the past 72 hour(s))  Glucose, capillary     Status: Abnormal   Collection Time: 11/18/15 12:13 PM  Result Value Ref Range   Glucose-Capillary 101 (H) 65 - 99 mg/dL  Glucose, capillary     Status: None   Collection Time: 11/18/15  4:41 PM  Result Value Ref Range   Glucose-Capillary 98 65 - 99 mg/dL  Glucose, capillary     Status: Abnormal   Collection Time: 11/18/15  9:51 PM  Result Value Ref Range   Glucose-Capillary 128 (H) 65 - 99 mg/dL  Basic metabolic panel     Status: Abnormal   Collection Time: 11/19/15  5:46 AM  Result Value Ref Range   Sodium 134 (L) 135 - 145 mmol/L   Potassium 5.1 3.5 - 5.1 mmol/L   Chloride 104 101 - 111 mmol/L   CO2 22 22 - 32 mmol/L   Glucose, Bld 98 65 - 99 mg/dL   BUN 27 (H) 6 - 20 mg/dL   Creatinine, Ser 1.68 (H) 0.61 - 1.24 mg/dL   Calcium 8.2 (L) 8.9 - 10.3 mg/dL   GFR calc non Af Amer 45 (L) >60 mL/min   GFR calc Af Amer 52 (L) >60 mL/min    Comment: (NOTE) The eGFR has been calculated using the CKD EPI equation. This calculation has not been validated in all clinical situations. eGFR's persistently <60 mL/min signify possible Chronic Kidney Disease.    Anion gap 8 5 - 15  Protime-INR     Status: Abnormal   Collection Time: 11/19/15  5:46 AM  Result Value Ref Range   Prothrombin Time 28.4 (H) 11.6 - 15.2 seconds   INR 2.71 (H) 0.00 - 1.49  Hepatic function panel     Status: Abnormal   Collection Time: 11/19/15  5:46 AM  Result  Value Ref Range   Total Protein 6.4 (L) 6.5 - 8.1 g/dL   Albumin 2.3 (L) 3.5 - 5.0 g/dL   AST 52 (H) 15 - 41 U/L   ALT 27 17 - 63 U/L   Alkaline Phosphatase 60 38 - 126 U/L   Total Bilirubin 11.8 (H) 0.3 - 1.2 mg/dL   Bilirubin, Direct 4.1 (H) 0.1 - 0.5 mg/dL   Indirect Bilirubin 7.7 (H) 0.3 - 0.9 mg/dL  Glucose, capillary     Status: Abnormal   Collection Time: 11/19/15  6:52 AM  Result Value Ref Range   Glucose-Capillary 108 (H) 65 - 99 mg/dL  Glucose, capillary     Status: None   Collection Time: 11/19/15 12:13 PM  Result Value Ref Range   Glucose-Capillary 88 65 - 99 mg/dL   Comment 1 Notify RN   Glucose, capillary     Status: Abnormal   Collection Time: 11/19/15  4:49 PM  Result Value Ref Range   Glucose-Capillary 115 (H) 65 - 99 mg/dL  Glucose, capillary     Status: Abnormal   Collection Time: 11/19/15  8:37 PM  Result Value Ref Range   Glucose-Capillary 153 (H) 65 - 99  mg/dL   Comment 1 Notify RN   Glucose, capillary     Status: Abnormal   Collection Time: 11/20/15  6:36 AM  Result Value Ref Range   Glucose-Capillary 102 (H) 65 - 99 mg/dL   Comment 1 Notify RN   Basic metabolic panel     Status: Abnormal   Collection Time: 11/20/15  7:59 AM  Result Value Ref Range   Sodium 133 (L) 135 - 145 mmol/L   Potassium 5.9 (H) 3.5 - 5.1 mmol/L   Chloride 102 101 - 111 mmol/L   CO2 20 (L) 22 - 32 mmol/L   Glucose, Bld 87 65 - 99 mg/dL   BUN 27 (H) 6 - 20 mg/dL   Creatinine, Ser 1.52 (H) 0.61 - 1.24 mg/dL   Calcium 8.2 (L) 8.9 - 10.3 mg/dL   GFR calc non Af Amer 51 (L) >60 mL/min   GFR calc Af Amer 59 (L) >60 mL/min    Comment: (NOTE) The eGFR has been calculated using the CKD EPI equation. This calculation has not been validated in all clinical situations. eGFR's persistently <60 mL/min signify possible Chronic Kidney Disease.    Anion gap 11 5 - 15  Glucose, capillary     Status: Abnormal   Collection Time: 11/20/15 11:16 AM  Result Value Ref Range    Glucose-Capillary 149 (H) 65 - 99 mg/dL  Glucose, capillary     Status: None   Collection Time: 11/20/15  5:03 PM  Result Value Ref Range   Glucose-Capillary 95 65 - 99 mg/dL  Glucose, capillary     Status: Abnormal   Collection Time: 11/20/15  8:46 PM  Result Value Ref Range   Glucose-Capillary 110 (H) 65 - 99 mg/dL   Comment 1 Notify RN   Basic metabolic panel     Status: Abnormal   Collection Time: 11/21/15  5:31 AM  Result Value Ref Range   Sodium 131 (L) 135 - 145 mmol/L   Potassium 5.4 (H) 3.5 - 5.1 mmol/L   Chloride 101 101 - 111 mmol/L   CO2 19 (L) 22 - 32 mmol/L   Glucose, Bld 96 65 - 99 mg/dL   BUN 29 (H) 6 - 20 mg/dL   Creatinine, Ser 1.60 (H) 0.61 - 1.24 mg/dL   Calcium 8.2 (L) 8.9 - 10.3 mg/dL   GFR calc non Af Amer 48 (L) >60 mL/min   GFR calc Af Amer 55 (L) >60 mL/min    Comment: (NOTE) The eGFR has been calculated using the CKD EPI equation. This calculation has not been validated in all clinical situations. eGFR's persistently <60 mL/min signify possible Chronic Kidney Disease.    Anion gap 11 5 - 15  Glucose, capillary     Status: None   Collection Time: 11/21/15  6:54 AM  Result Value Ref Range   Glucose-Capillary 94 65 - 99 mg/dL   Comment 1 Notify RN      HEENT: Normocephalic. icteric Cardio: + Tachycardic. Regular rhythm. no murmurs Resp: CTA B/L and unlabored GI: Distention and ascites. Minimal pain  Musculoskeletal:  Edema edema in both thighs but minimal edema in the feet. No tenderness Skin:   Other jaundice. Warm and dry. Neuro: Alert/Oriented Motor: 4+/5 bilateral deltoid, biceps, triceps, grip, 2/5 hip flexor knee extensor 4-/5 ankle dorsiflexor and plantar flexor Gen. no acute distress. Vital signs reviewed.  GU: Not examined today  Assessment/Plan: 1. Functional deficits secondary to Debilitation secondary to respiratory failure/sepsis, complications from cirrhosis of liver/ascites. which require  3+ hours per day of interdisciplinary  therapy in a comprehensive inpatient rehab setting. Physiatrist is providing close team supervision and 24 hour management of active medical problems listed below. Physiatrist and rehab team continue to assess barriers to discharge/monitor patient progress toward functional and medical goals. FIM: Function - Bathing Bathing activity did not occur: Refused Position: Wheelchair/chair at sink Body parts bathed by patient: Right arm, Left arm, Chest, Abdomen, Right upper leg, Left upper leg, Front perineal area, Buttocks Body parts bathed by helper: Right lower leg, Left lower leg, Back Assist Level: Supervision or verbal cues  Function- Upper Body Dressing/Undressing What is the patient wearing?: Hospital gown Pull over shirt/dress - Perfomed by patient: Thread/unthread right sleeve, Thread/unthread left sleeve, Put head through opening Pull over shirt/dress - Perfomed by helper: Pull shirt over trunk Assist Level: Supervision or verbal cues Function - Lower Body Dressing/Undressing What is the patient wearing?: Hospital Gown, Non-skid slipper socks Position: Wheelchair/chair at sink Underwear - Performed by helper: Thread/unthread right underwear leg, Thread/unthread left underwear leg, Pull underwear up/down Pants- Performed by patient: Pull pants up/down, Thread/unthread left pants leg, Thread/unthread right pants leg Pants- Performed by helper: Pull pants up/down, Thread/unthread left pants leg, Thread/unthread right pants leg Non-skid slipper socks- Performed by helper: Don/doff right sock, Don/doff left sock Shoes - Performed by helper: Don/doff right shoe, Don/doff left shoe, Fasten right, Fasten left Assist for footwear: Dependant Assist for lower body dressing: Touching or steadying assistance (Pt > 75%)  Function - Toileting Toileting activity did not occur: N/A Toileting steps completed by patient: Performs perineal hygiene Toileting steps completed by helper: Adjust clothing  prior to toileting, Adjust clothing after toileting Toileting Assistive Devices: Grab bar or rail Assist level: Touching or steadying assistance (Pt.75%)  Function - Archivist transfer activity did not occur: N/A Toilet transfer assistive device: Walker Assist level to toilet: Touching or steadying assistance (Pt > 75%) (per [place] Ingram, NT report) Assist level from toilet: Touching or steadying assistance (Pt > 75%)  Function - Chair/bed transfer Chair/bed transfer activity did not occur: N/A Chair/bed transfer method: Stand pivot Chair/bed transfer assist level: Touching or steadying assistance (Pt > 75%) Chair/bed transfer assistive device: Armrests, Walker Chair/bed transfer details: Verbal cues for technique, Verbal cues for precautions/safety, Verbal cues for safe use of DME/AE  Function - Locomotion: Wheelchair Will patient use wheelchair at discharge?: Yes Type: Manual Max wheelchair distance: 279ft Assist Level: Supervision or verbal cues Assist Level: Supervision or verbal cues Wheel 150 feet activity did not occur: Safety/medical concerns Assist Level: Supervision or verbal cues Turns around,maneuvers to table,bed, and toilet,negotiates 3% grade,maneuvers on rugs and over doorsills: No Function - Locomotion: Ambulation Assistive device: Walker-rolling Max distance: 100 ft Assist level: Supervision or verbal cues Assist level: Supervision or verbal cues Assist level: Supervision or verbal cues Walk 150 feet activity did not occur: Safety/medical concerns Assist level: Touching or steadying assistance (Pt > 75%) Assist level: Moderate assist (Pt 50 - 74%)  Function - Comprehension Comprehension: Auditory Comprehension assist level: Understands complex 90% of the time/cues 10% of the time  Function - Expression Expression: Verbal Expression assist level: Expresses basic needs/ideas: With no assist  Function - Social Interaction Social Interaction  assist level: Interacts appropriately with others with medication or extra time (anti-anxiety, antidepressant).  Function - Problem Solving Problem solving assist level: Solves basic 75 - 89% of the time/requires cueing 10 - 24% of the time  Function - Memory Memory assist level: Recognizes or recalls  50 - 74% of the time/requires cueing 25 - 49% of the time Patient normally able to recall (first 3 days only): Current season, Location of own room, Staff names and faces, That he or she is in a hospital    Medical Problem List and Plan: 1.  Debilitation secondary to respiratory failure/sepsis, complications from cirrhosis of liver/ascites. Status post paracentesis 11/02/2015             -continue CIR 2.  DVT Prophylaxis/Anticoagulation: SCDs. Monitor for any signs of DVT 3. Pain Management: Tylenol as needed 4. Anemia/thrombocytopenia question DIC related to cirrhosis. Status post transfused multiple units. CBC Latest Ref Rng 11/16/2015 11/12/2015 11/11/2015  WBC 4.0 - 10.5 K/uL 11.5(H) 13.3(H) 13.5(H)  Hemoglobin 13.0 - 17.0 g/dL 9.1(L) 8.6(L) 8.6(L)  Hematocrit 39.0 - 52.0 % 27.6(L) 26.3(L) 25.4(L)  Platelets 150 - 400 K/uL 173 149(L) 141(L)    5. Neuropsych: This patient is capable of making decisions on his own behalf. 6. Skin/Wound Care: Routine skin checks 7. Fluids/Electrolytes/Nutrition: encourage po as possible. Appetite better with rx of ascites 8. Hypertension. No current antihypertensive medication. Follow with increased mobility 9. Acute renal insufficiency. BUN/Cr slightly increased due to diuretics BMP Latest Ref Rng 11/21/2015 11/20/2015 11/19/2015  Glucose 65 - 99 mg/dL 96 87 98  BUN 6 - 20 mg/dL 29(H) 27(H) 27(H)  Creatinine 0.61 - 1.24 mg/dL 1.60(H) 1.52(H) 1.68(H)  Sodium 135 - 145 mmol/L 131(L) 133(L) 134(L)  Potassium 3.5 - 5.1 mmol/L 5.4(H) 5.9(H) 5.1  Chloride 101 - 111 mmol/L 101 102 104  CO2 22 - 32 mmol/L 19(L) 20(L) 22  Calcium 8.9 - 10.3 mg/dL 8.2(L) 8.2(L)  8.2(L)    10. Sinus tachycardia with SVT versus atrial fibrillation. Amiodarone 200 mg daily.  11. Liver failure with ascites:  S/p numerous paracenteses  -apprec GI help  -aldactone added, lasix decreased--adjust further as needed/tolerated   -?TIPS    -LFT's fairly stable, ammonia level ok  -repeat paracentesis per GI 12. Penile/scrotal edema----local care/lidocaine gel for comfort, nystatin for skin  -  rx of #11  -continue foley for now as edema resolves 13. Diarrhea: Improving  -abx have already been stopped  -diarrhea doesn't appear c/w c diff  -added probiotic  -appreciate any GI recs  LOS (Days) 8 A FACE TO FACE EVALUATION WAS PERFORMED  [name] [name] 11/21/2015, 8:25 AM

## 2015-11-22 DIAGNOSIS — R195 Other fecal abnormalities: Secondary | ICD-10-CM

## 2015-11-22 LAB — BASIC METABOLIC PANEL
ANION GAP: 11 (ref 5–15)
BUN: 25 mg/dL — AB (ref 6–20)
CALCIUM: 8.2 mg/dL — AB (ref 8.9–10.3)
CO2: 18 mmol/L — ABNORMAL LOW (ref 22–32)
Chloride: 101 mmol/L (ref 101–111)
Creatinine, Ser: 1.67 mg/dL — ABNORMAL HIGH (ref 0.61–1.24)
GFR calc Af Amer: 52 mL/min — ABNORMAL LOW (ref >60)
GFR, EST NON AFRICAN AMERICAN: 45 mL/min — AB (ref >60)
GLUCOSE: 109 mg/dL — AB (ref 65–99)
Potassium: 4.8 mmol/L (ref 3.5–5.1)
Sodium: 130 mmol/L — ABNORMAL LOW (ref 135–145)

## 2015-11-22 LAB — GLUCOSE, CAPILLARY
GLUCOSE-CAPILLARY: 129 mg/dL — AB (ref 65–99)
Glucose-Capillary: 98 mg/dL (ref 65–99)
Glucose-Capillary: 115 mg/dL — ABNORMAL HIGH (ref 65–99)
Glucose-Capillary: 60 mg/dL — ABNORMAL LOW (ref 65–99)
Glucose-Capillary: 116 mg/dL — ABNORMAL HIGH (ref 65–99)

## 2015-11-22 NOTE — Progress Notes (Signed)
Subjective/Complaints: Patient sitting up in bed watching the re-play of the basketball game last night. He states he slept well overnight. He notes that abdomen is distended and inquires about another tap.  ROS: Denies CP, SOB, nausea, vomiting, diarrhea.  Objective: Vital Signs: Blood pressure 111/64, pulse 95, temperature 99.8 F (37.7 C), temperature source Oral, resp. rate 18, height 6' (1.829 m), weight 97.3 kg (214 lb 8.1 oz), SpO2 98 %. No results found. Results for orders placed or performed during the hospital encounter of 11/13/15 (from the past 72 hour(s))  Glucose, capillary     Status: None   Collection Time: 11/19/15 12:13 PM  Result Value Ref Range   Glucose-Capillary 88 65 - 99 mg/dL   Comment 1 Notify RN   Glucose, capillary     Status: Abnormal   Collection Time: 11/19/15  4:49 PM  Result Value Ref Range   Glucose-Capillary 115 (H) 65 - 99 mg/dL  Glucose, capillary     Status: Abnormal   Collection Time: 11/19/15  8:37 PM  Result Value Ref Range   Glucose-Capillary 153 (H) 65 - 99 mg/dL   Comment 1 Notify RN   Glucose, capillary     Status: Abnormal   Collection Time: 11/20/15  6:36 AM  Result Value Ref Range   Glucose-Capillary 102 (H) 65 - 99 mg/dL   Comment 1 Notify RN   Basic metabolic panel     Status: Abnormal   Collection Time: 11/20/15  7:59 AM  Result Value Ref Range   Sodium 133 (L) 135 - 145 mmol/L   Potassium 5.9 (H) 3.5 - 5.1 mmol/L   Chloride 102 101 - 111 mmol/L   CO2 20 (L) 22 - 32 mmol/L   Glucose, Bld 87 65 - 99 mg/dL   BUN 27 (H) 6 - 20 mg/dL   Creatinine, Ser 1.52 (H) 0.61 - 1.24 mg/dL   Calcium 8.2 (L) 8.9 - 10.3 mg/dL   GFR calc non Af Amer 51 (L) >60 mL/min   GFR calc Af Amer 59 (L) >60 mL/min    Comment: (NOTE) The eGFR has been calculated using the CKD EPI equation. This calculation has not been validated in all clinical situations. eGFR's persistently <60 mL/min signify possible Chronic Kidney Disease.    Anion gap 11 5 -  15  Glucose, capillary     Status: Abnormal   Collection Time: 11/20/15 11:16 AM  Result Value Ref Range   Glucose-Capillary 149 (H) 65 - 99 mg/dL  Glucose, capillary     Status: None   Collection Time: 11/20/15  5:03 PM  Result Value Ref Range   Glucose-Capillary 95 65 - 99 mg/dL  Glucose, capillary     Status: Abnormal   Collection Time: 11/20/15  8:46 PM  Result Value Ref Range   Glucose-Capillary 110 (H) 65 - 99 mg/dL   Comment 1 Notify RN   Basic metabolic panel     Status: Abnormal   Collection Time: 11/21/15  5:31 AM  Result Value Ref Range   Sodium 131 (L) 135 - 145 mmol/L   Potassium 5.4 (H) 3.5 - 5.1 mmol/L   Chloride 101 101 - 111 mmol/L   CO2 19 (L) 22 - 32 mmol/L   Glucose, Bld 96 65 - 99 mg/dL   BUN 29 (H) 6 - 20 mg/dL   Creatinine, Ser 1.60 (H) 0.61 - 1.24 mg/dL   Calcium 8.2 (L) 8.9 - 10.3 mg/dL   GFR calc non Af Amer 48 (L) >60  mL/min   GFR calc Af Amer 55 (L) >60 mL/min    Comment: (NOTE) The eGFR has been calculated using the CKD EPI equation. This calculation has not been validated in all clinical situations. eGFR's persistently <60 mL/min signify possible Chronic Kidney Disease.    Anion gap 11 5 - 15  Glucose, capillary     Status: None   Collection Time: 11/21/15  6:54 AM  Result Value Ref Range   Glucose-Capillary 94 65 - 99 mg/dL   Comment 1 Notify RN   Glucose, capillary     Status: None   Collection Time: 11/21/15 11:21 AM  Result Value Ref Range   Glucose-Capillary 97 65 - 99 mg/dL   Comment 1 Notify RN   Glucose, capillary     Status: Abnormal   Collection Time: 11/21/15  5:23 PM  Result Value Ref Range   Glucose-Capillary 113 (H) 65 - 99 mg/dL  Glucose, capillary     Status: Abnormal   Collection Time: 11/21/15  8:40 PM  Result Value Ref Range   Glucose-Capillary 109 (H) 65 - 99 mg/dL   Comment 1 Notify RN   Glucose, capillary     Status: None   Collection Time: 11/22/15  6:38 AM  Result Value Ref Range   Glucose-Capillary 98 65 -  99 mg/dL   Comment 1 Notify RN      HEENT: Normocephalic. icteric Cardio: Regular rate. Regular rhythm. no murmurs Resp: CTA B/L and unlabored GI: Distention and ascites. Minimal pain  Musculoskeletal:  Edema edema in both thighs but minimal edema in the feet. No tenderness Skin:   Other jaundice. Warm and dry. Neuro: Alert/Oriented Motor: 4+/5 bilateral deltoid, biceps, triceps, grip, 2/5 hip flexor knee extensor 4-/5 ankle dorsiflexor and plantar flexor Gen. no acute distress. Vital signs reviewed.  GU: Not examined today  Assessment/Plan: 1. Functional deficits secondary to Debilitation secondary to respiratory failure/sepsis, complications from cirrhosis of liver/ascites. which require 3+ hours per day of interdisciplinary therapy in a comprehensive inpatient rehab setting. Physiatrist is providing close team supervision and 24 hour management of active medical problems listed below. Physiatrist and rehab team continue to assess barriers to discharge/monitor patient progress toward functional and medical goals. FIM: Function - Bathing Bathing activity did not occur: Refused Position: Wheelchair/chair at sink Body parts bathed by patient: Right arm, Left arm, Chest, Abdomen, Right upper leg, Left upper leg, Front perineal area, Buttocks Body parts bathed by helper: Right lower leg, Left lower leg, Back Assist Level: Supervision or verbal cues  Function- Upper Body Dressing/Undressing What is the patient wearing?: Hospital gown Pull over shirt/dress - Perfomed by patient: Thread/unthread right sleeve, Thread/unthread left sleeve, Put head through opening Pull over shirt/dress - Perfomed by helper: Pull shirt over trunk Assist Level: Supervision or verbal cues Function - Lower Body Dressing/Undressing What is the patient wearing?: Hospital Gown, Non-skid slipper socks Position: Wheelchair/chair at sink Underwear - Performed by helper: Thread/unthread right underwear leg,  Thread/unthread left underwear leg, Pull underwear up/down Pants- Performed by patient: Pull pants up/down, Thread/unthread left pants leg, Thread/unthread right pants leg Pants- Performed by helper: Pull pants up/down, Thread/unthread left pants leg, Thread/unthread right pants leg Non-skid slipper socks- Performed by helper: Don/doff right sock, Don/doff left sock Shoes - Performed by helper: Don/doff right shoe, Don/doff left shoe, Fasten right, Fasten left Assist for footwear: Dependant Assist for lower body dressing: Touching or steadying assistance (Pt > 75%)  Function - Toileting Toileting activity did not occur: N/A Toileting steps  completed by patient: Adjust clothing prior to toileting, Adjust clothing after toileting, Performs perineal hygiene Toileting steps completed by helper: Adjust clothing prior to toileting, Adjust clothing after toileting Toileting Assistive Devices: Grab bar or rail Assist level: Touching or steadying assistance (Pt.75%)  Function - Toilet Transfers Toilet transfer activity did not occur: N/A Toilet transfer assistive device: Grab bar Assist level to toilet: Moderate assist (Pt 50 - 74%/lift or lower) Assist level from toilet: Moderate assist (Pt 50 - 74%/lift or lower)  Function - Chair/bed transfer Chair/bed transfer activity did not occur: N/A Chair/bed transfer method: Stand pivot Chair/bed transfer assist level: Touching or steadying assistance (Pt > 75%) Chair/bed transfer assistive device: Armrests, Walker Chair/bed transfer details: Verbal cues for technique, Verbal cues for precautions/safety, Verbal cues for safe use of DME/AE  Function - Locomotion: Wheelchair Will patient use wheelchair at discharge?: Yes Type: Manual Max wheelchair distance: 286f Assist Level: Supervision or verbal cues Assist Level: Supervision or verbal cues Wheel 150 feet activity did not occur: Safety/medical concerns Assist Level: Supervision or verbal  cues Turns around,maneuvers to table,bed, and toilet,negotiates 3% grade,maneuvers on rugs and over doorsills: No Function - Locomotion: Ambulation Assistive device: Walker-rolling Max distance: 100 ft Assist level: Supervision or verbal cues Assist level: Supervision or verbal cues Assist level: Supervision or verbal cues Walk 150 feet activity did not occur: Safety/medical concerns Assist level: Touching or steadying assistance (Pt > 75%) Assist level: Moderate assist (Pt 50 - 74%)  Function - Comprehension Comprehension: Auditory Comprehension assist level: Understands complex 90% of the time/cues 10% of the time  Function - Expression Expression: Verbal Expression assist level: Expresses basic needs/ideas: With no assist  Function - Social Interaction Social Interaction assist level: Interacts appropriately with others with medication or extra time (anti-anxiety, antidepressant).  Function - Problem Solving Problem solving assist level: Solves basic 75 - 89% of the time/requires cueing 10 - 24% of the time  Function - Memory Memory assist level: Recognizes or recalls 50 - 74% of the time/requires cueing 25 - 49% of the time Patient normally able to recall (first 3 days only): Current season, Location of own room, Staff names and faces, That he or she is in a hospital    Medical Problem List and Plan: 1.  Debilitation secondary to respiratory failure/sepsis, complications from cirrhosis of liver/ascites. Status post paracentesis 11/02/2015             -continue CIR 2.  DVT Prophylaxis/Anticoagulation: SCDs. Monitor for any signs of DVT 3. Pain Management: Tylenol as needed 4. Anemia/thrombocytopenia question DIC related to cirrhosis. Status post transfused multiple units. CBC Latest Ref Rng 11/16/2015 11/12/2015 11/11/2015  WBC 4.0 - 10.5 K/uL 11.5(H) 13.3(H) 13.5(H)  Hemoglobin 13.0 - 17.0 g/dL 9.1(L) 8.6(L) 8.6(L)  Hematocrit 39.0 - 52.0 % 27.6(L) 26.3(L) 25.4(L)  Platelets  150 - 400 K/uL 173 149(L) 141(L)    5. Neuropsych: This patient is capable of making decisions on his own behalf. 6. Skin/Wound Care: Routine skin checks 7. Fluids/Electrolytes/Nutrition: encourage po as possible. Appetite better with rx of ascites 8. Hypertension. No current antihypertensive medication. Follow with increased mobility 9. Acute renal insufficiency. BUN/Cr slightly increased due to diuretics BMP Latest Ref Rng 11/21/2015 11/20/2015 11/19/2015  Glucose 65 - 99 mg/dL 96 87 98  BUN 6 - 20 mg/dL 29(H) 27(H) 27(H)  Creatinine 0.61 - 1.24 mg/dL 1.60(H) 1.52(H) 1.68(H)  Sodium 135 - 145 mmol/L 131(L) 133(L) 134(L)  Potassium 3.5 - 5.1 mmol/L 5.4(H) 5.9(H) 5.1  Chloride 101 -  111 mmol/L 101 102 104  CO2 22 - 32 mmol/L 19(L) 20(L) 22  Calcium 8.9 - 10.3 mg/dL 8.2(L) 8.2(L) 8.2(L)    10. Sinus tachycardia with SVT versus atrial fibrillation. Amiodarone 200 mg daily.  11. Liver failure with ascites:  S/p numerous paracenteses  -apprec GI help  -aldactone added, lasix decreased--adjust further as needed/tolerated   -?TIPS    -LFT's fairly stable, ammonia level ok  -repeat paracentesis per GI 12. Penile/scrotal edema----local care/lidocaine gel for comfort, nystatin for skin  -  rx of #11 13. Diarrhea: Improving  -abx have already been stopped  -added probiotic  -appreciate any GI recs  LOS (Days) 9 A FACE TO FACE EVALUATION WAS PERFORMED  Johniece Hornbaker Lorie Phenix 11/22/2015, 7:22 AM

## 2015-11-23 ENCOUNTER — Inpatient Hospital Stay (HOSPITAL_COMMUNITY): Payer: Federal, State, Local not specified - PPO | Admitting: Physical Therapy

## 2015-11-23 ENCOUNTER — Inpatient Hospital Stay (HOSPITAL_COMMUNITY): Payer: Federal, State, Local not specified - PPO | Admitting: Occupational Therapy

## 2015-11-23 LAB — HEPATIC FUNCTION PANEL
ALBUMIN: 2.1 g/dL — AB (ref 3.5–5.0)
ALK PHOS: 62 U/L (ref 38–126)
ALT: 35 U/L (ref 17–63)
AST: 67 U/L — ABNORMAL HIGH (ref 15–41)
BILIRUBIN INDIRECT: 9.4 mg/dL — AB (ref 0.3–0.9)
BILIRUBIN TOTAL: 15.1 mg/dL — AB (ref 0.3–1.2)
Bilirubin, Direct: 5.7 mg/dL — ABNORMAL HIGH (ref 0.1–0.5)
TOTAL PROTEIN: 6.9 g/dL (ref 6.5–8.1)

## 2015-11-23 LAB — GLUCOSE, CAPILLARY
GLUCOSE-CAPILLARY: 107 mg/dL — AB (ref 65–99)
Glucose-Capillary: 85 mg/dL (ref 65–99)
Glucose-Capillary: 99 mg/dL (ref 65–99)
Glucose-Capillary: 108 mg/dL — ABNORMAL HIGH (ref 65–99)

## 2015-11-23 MED ORDER — OXYMETAZOLINE HCL 0.05 % NA SOLN
1.0000 | Freq: Two times a day (BID) | NASAL | Status: DC
  Administered 2015-11-23 – 2015-11-29 (×2): 1 via NASAL
  Filled 2015-11-23: qty 15

## 2015-11-23 NOTE — Progress Notes (Signed)
Subjective/Complaints: Sitting up in chair. Feels uncomfortable. Belly tighter. Doesn't have appetite today.  ROS: Denies CP, SOB, nausea, vomiting, diarrhea.  Objective: Vital Signs: Blood pressure 117/70, pulse 96, temperature 98.5 F (36.9 C), temperature source Oral, resp. rate 18, height 6' (1.829 m), weight 99.1 kg (218 lb 7.6 oz), SpO2 99 %. No results found. Results for orders placed or performed during the hospital encounter of 11/13/15 (from the past 72 hour(s))  Glucose, capillary     Status: Abnormal   Collection Time: 11/20/15 11:16 AM  Result Value Ref Range   Glucose-Capillary 149 (H) 65 - 99 mg/dL  Glucose, capillary     Status: None   Collection Time: 11/20/15  5:03 PM  Result Value Ref Range   Glucose-Capillary 95 65 - 99 mg/dL  Glucose, capillary     Status: Abnormal   Collection Time: 11/20/15  8:46 PM  Result Value Ref Range   Glucose-Capillary 110 (H) 65 - 99 mg/dL   Comment 1 Notify RN   Basic metabolic panel     Status: Abnormal   Collection Time: 11/21/15  5:31 AM  Result Value Ref Range   Sodium 131 (L) 135 - 145 mmol/L   Potassium 5.4 (H) 3.5 - 5.1 mmol/L   Chloride 101 101 - 111 mmol/L   CO2 19 (L) 22 - 32 mmol/L   Glucose, Bld 96 65 - 99 mg/dL   BUN 29 (H) 6 - 20 mg/dL   Creatinine, Ser 1.60 (H) 0.61 - 1.24 mg/dL   Calcium 8.2 (L) 8.9 - 10.3 mg/dL   GFR calc non Af Amer 48 (L) >60 mL/min   GFR calc Af Amer 55 (L) >60 mL/min    Comment: (NOTE) The eGFR has been calculated using the CKD EPI equation. This calculation has not been validated in all clinical situations. eGFR's persistently <60 mL/min signify possible Chronic Kidney Disease.    Anion gap 11 5 - 15  Glucose, capillary     Status: None   Collection Time: 11/21/15  6:54 AM  Result Value Ref Range   Glucose-Capillary 94 65 - 99 mg/dL   Comment 1 Notify RN   Glucose, capillary     Status: None   Collection Time: 11/21/15 11:21 AM  Result Value Ref Range   Glucose-Capillary 97 65  - 99 mg/dL   Comment 1 Notify RN   Glucose, capillary     Status: Abnormal   Collection Time: 11/21/15  5:23 PM  Result Value Ref Range   Glucose-Capillary 113 (H) 65 - 99 mg/dL  Glucose, capillary     Status: Abnormal   Collection Time: 11/21/15  8:40 PM  Result Value Ref Range   Glucose-Capillary 109 (H) 65 - 99 mg/dL   Comment 1 Notify RN   Basic metabolic panel     Status: Abnormal   Collection Time: 11/22/15  5:57 AM  Result Value Ref Range   Sodium 130 (L) 135 - 145 mmol/L   Potassium 4.8 3.5 - 5.1 mmol/L   Chloride 101 101 - 111 mmol/L   CO2 18 (L) 22 - 32 mmol/L   Glucose, Bld 109 (H) 65 - 99 mg/dL   BUN 25 (H) 6 - 20 mg/dL   Creatinine, Ser 1.67 (H) 0.61 - 1.24 mg/dL   Calcium 8.2 (L) 8.9 - 10.3 mg/dL   GFR calc non Af Amer 45 (L) >60 mL/min   GFR calc Af Amer 52 (L) >60 mL/min    Comment: (NOTE) The eGFR has been  calculated using the CKD EPI equation. This calculation has not been validated in all clinical situations. eGFR's persistently <60 mL/min signify possible Chronic Kidney Disease.    Anion gap 11 5 - 15  Glucose, capillary     Status: None   Collection Time: 11/22/15  6:38 AM  Result Value Ref Range   Glucose-Capillary 98 65 - 99 mg/dL   Comment 1 Notify RN   Glucose, capillary     Status: Abnormal   Collection Time: 11/22/15 11:46 AM  Result Value Ref Range   Glucose-Capillary 129 (H) 65 - 99 mg/dL  Glucose, capillary     Status: Abnormal   Collection Time: 11/22/15  4:27 PM  Result Value Ref Range   Glucose-Capillary 115 (H) 65 - 99 mg/dL  Glucose, capillary     Status: Abnormal   Collection Time: 11/22/15  9:08 PM  Result Value Ref Range   Glucose-Capillary 60 (L) 65 - 99 mg/dL  Glucose, capillary     Status: Abnormal   Collection Time: 11/22/15  9:40 PM  Result Value Ref Range   Glucose-Capillary 116 (H) 65 - 99 mg/dL  Hepatic function panel     Status: Abnormal   Collection Time: 11/23/15  6:38 AM  Result Value Ref Range   Total Protein  6.9 6.5 - 8.1 g/dL   Albumin 2.1 (L) 3.5 - 5.0 g/dL   AST 67 (H) 15 - 41 U/L   ALT 35 17 - 63 U/L   Alkaline Phosphatase 62 38 - 126 U/L   Total Bilirubin 15.1 (H) 0.3 - 1.2 mg/dL   Bilirubin, Direct 5.7 (H) 0.1 - 0.5 mg/dL   Indirect Bilirubin 9.4 (H) 0.3 - 0.9 mg/dL  Glucose, capillary     Status: None   Collection Time: 11/23/15  6:58 AM  Result Value Ref Range   Glucose-Capillary 85 65 - 99 mg/dL     HEENT: Normocephalic. icteric Cardio: Regular rate. Regular rhythm. no murmurs Resp: CTA B/L and unlabored GI: Distention and ascites, appears increasd.  Musculoskeletal:  Edema edema in both thighs and the feet. No tenderness Skin:   Other jaundice. Warm and dry. Neuro: Alert/Oriented Motor: 4+/5 bilateral deltoid, biceps, triceps, grip, 2/5 hip flexor knee extensor 4-/5 ankle dorsiflexor and plantar flexor Gen. no acute distress.  GU: scrotal edema  Assessment/Plan: 1. Functional deficits secondary to Debilitation secondary to respiratory failure/sepsis, complications from cirrhosis of liver/ascites. which require 3+ hours per day of interdisciplinary therapy in a comprehensive inpatient rehab setting. Physiatrist is providing close team supervision and 24 hour management of active medical problems listed below. Physiatrist and rehab team continue to assess barriers to discharge/monitor patient progress toward functional and medical goals. FIM: Function - Bathing Bathing activity did not occur: Refused Position: Wheelchair/chair at sink Body parts bathed by patient: Right arm, Left arm, Chest, Abdomen, Right upper leg, Left upper leg, Front perineal area, Buttocks Body parts bathed by helper: Right lower leg, Left lower leg, Back Assist Level: Supervision or verbal cues  Function- Upper Body Dressing/Undressing What is the patient wearing?: Hospital gown Pull over shirt/dress - Perfomed by patient: Thread/unthread right sleeve, Thread/unthread left sleeve, Put head through  opening Pull over shirt/dress - Perfomed by helper: Pull shirt over trunk Assist Level: Supervision or verbal cues Function - Lower Body Dressing/Undressing What is the patient wearing?: Hospital Gown, Non-skid slipper socks Position: Wheelchair/chair at sink Underwear - Performed by helper: Thread/unthread right underwear leg, Thread/unthread left underwear leg, Pull underwear up/down Pants- Performed by patient:  Pull pants up/down, Thread/unthread left pants leg, Thread/unthread right pants leg Pants- Performed by helper: Pull pants up/down, Thread/unthread left pants leg, Thread/unthread right pants leg Non-skid slipper socks- Performed by helper: Don/doff right sock, Don/doff left sock Shoes - Performed by helper: Don/doff right shoe, Don/doff left shoe, Fasten right, Fasten left Assist for footwear: Dependant Assist for lower body dressing: Touching or steadying assistance (Pt > 75%)  Function - Toileting Toileting activity did not occur: N/A Toileting steps completed by patient: Adjust clothing prior to toileting, Adjust clothing after toileting, Performs perineal hygiene Toileting steps completed by helper: Adjust clothing prior to toileting, Adjust clothing after toileting Toileting Assistive Devices: Grab bar or rail Assist level: Touching or steadying assistance (Pt.75%)  Function - Air cabin crew transfer activity did not occur: N/A Toilet transfer assistive device: Grab bar Assist level to toilet: Moderate assist (Pt 50 - 74%/lift or lower) Assist level from toilet: Moderate assist (Pt 50 - 74%/lift or lower)  Function - Chair/bed transfer Chair/bed transfer activity did not occur: N/A Chair/bed transfer method: Stand pivot Chair/bed transfer assist level: Touching or steadying assistance (Pt > 75%) Chair/bed transfer assistive device: Armrests, Walker Chair/bed transfer details: Verbal cues for technique, Verbal cues for precautions/safety, Verbal cues for safe  use of DME/AE  Function - Locomotion: Wheelchair Will patient use wheelchair at discharge?: Yes Type: Manual Max wheelchair distance: 236f Assist Level: Supervision or verbal cues Assist Level: Supervision or verbal cues Wheel 150 feet activity did not occur: Safety/medical concerns Assist Level: Supervision or verbal cues Turns around,maneuvers to table,bed, and toilet,negotiates 3% grade,maneuvers on rugs and over doorsills: No Function - Locomotion: Ambulation Assistive device: Walker-rolling Max distance: 100 ft Assist level: Supervision or verbal cues Assist level: Supervision or verbal cues Assist level: Supervision or verbal cues Walk 150 feet activity did not occur: Safety/medical concerns Assist level: Touching or steadying assistance (Pt > 75%) Assist level: Moderate assist (Pt 50 - 74%)  Function - Comprehension Comprehension: Auditory Comprehension assist level: Understands basic 90% of the time/cues < 10% of the time  Function - Expression Expression: Verbal Expression assist level: Expresses basic needs/ideas: With no assist  Function - Social Interaction Social Interaction assist level: Interacts appropriately with others with medication or extra time (anti-anxiety, antidepressant).  Function - Problem Solving Problem solving assist level: Solves basic 75 - 89% of the time/requires cueing 10 - 24% of the time  Function - Memory Memory assist level: Recognizes or recalls 50 - 74% of the time/requires cueing 25 - 49% of the time Patient normally able to recall (first 3 days only): Current season, Location of own room, Staff names and faces, That he or she is in a hospital    Medical Problem List and Plan: 1.  Debilitation secondary to respiratory failure/sepsis, complications from cirrhosis of liver/ascites. Status post paracentesis 11/02/2015             -continue CIR. He's working through GI sx 2.  DVT Prophylaxis/Anticoagulation: SCDs. Monitor for any  signs of DVT 3. Pain Management: Tylenol as needed 4. Anemia/thrombocytopenia question DIC related to cirrhosis. Status post transfused multiple units. CBC Latest Ref Rng 11/16/2015 11/12/2015 11/11/2015  WBC 4.0 - 10.5 K/uL 11.5(H) 13.3(H) 13.5(H)  Hemoglobin 13.0 - 17.0 g/dL 9.1(L) 8.6(L) 8.6(L)  Hematocrit 39.0 - 52.0 % 27.6(L) 26.3(L) 25.4(L)  Platelets 150 - 400 K/uL 173 149(L) 141(L)    5. Neuropsych: This patient is capable of making decisions on his own behalf. 6. Skin/Wound Care: Routine skin checks  7. Fluids/Electrolytes/Nutrition: encourage po as possible. Appetite better with rx of ascites 8. Hypertension. No current antihypertensive medication. Follow with increased mobility 9. Acute renal insufficiency. BUN/Cr holding despite diuretics BMP Latest Ref Rng 11/22/2015 11/21/2015 11/20/2015  Glucose 65 - 99 mg/dL 109(H) 96 87  BUN 6 - 20 mg/dL 25(H) 29(H) 27(H)  Creatinine 0.61 - 1.24 mg/dL 1.67(H) 1.60(H) 1.52(H)  Sodium 135 - 145 mmol/L 130(L) 131(L) 133(L)  Potassium 3.5 - 5.1 mmol/L 4.8 5.4(H) 5.9(H)  Chloride 101 - 111 mmol/L 101 101 102  CO2 22 - 32 mmol/L 18(L) 19(L) 20(L)  Calcium 8.9 - 10.3 mg/dL 8.2(L) 8.2(L) 8.2(L)    10. Sinus tachycardia with SVT versus atrial fibrillation. Amiodarone 200 mg daily.  11. Liver failure with ascites:  S/p numerous paracenteses  -apprec GI help  -aldactone added, lasix decreased--adjust further as needed/tolerated   -?TIPS    -LFT's fairly stable, ammonia level ok  -repeat paracentesis needed it appears 12. Penile/scrotal edema----local care/lidocaine gel for comfort, nystatin for skin  -  rx of #11 13. Diarrhea: soft to loose now  -continue probiotic  -appreciate any GI recs  LOS (Days) 10 A FACE TO FACE EVALUATION WAS PERFORMED  [name] T 11/23/2015, 8:42 AM

## 2015-11-23 NOTE — Progress Notes (Signed)
Occupational Therapy Session Note  Patient Details  Name: [name] MRN: [id number] Date of Birth: 10/10/1960  Today's Date: 11/23/2015 OT Individual Time:  - 0800-0900  (60 min)      Short Term Goals: Week 1:  OT Short Term Goal 1 (Week 1): STG= LTG OT Short Term Goal 1 - Progress (Week 1): Other (comment) (OTR downgraded LTGs to supervisoin) Week 2:  OT Short Term Goal 1 (Week 2): continue with downgraded LTG  Skilled Therapeutic Interventions/Progress Updates:    Pt. Engaged in sit to stand , standing balance, functional mobility.   Pt.sitting in wc upon OT arrival.     Pt.engaged in bathing at sink.  Performed sit to stand x 3 for 3 minutes each.   Engaged in leg raises for donning socks.  Pt perfromed UB bathing and upper legs with SET up.   Donned shirtt and hosp gown in sitting and set up.  Pt. Fatigued by end of session.  Pt.left in room with all items in reach.   Therapy Documentation Precautions:  Precautions Precautions: Fall Restrictions Weight Bearing Restrictions: No General:   Vital Signs: Therapy Vitals Temp: 98.5 F (36.9 C) Temp Source: Oral Pulse Rate: 96 Resp: 18 BP: 117/70 mmHg Patient Position (if appropriate): Lying Oxygen Therapy SpO2: 99 % O2 Device: Not Delivered Pain:  8/10 stomach.  Informed RN          See Function Navigator for Current Functional Status.   Therapy/Group: Individual Therapy  Humberto Sealsdwards, Vanderbilt Ranieri J 11/23/2015, 7:47 AM

## 2015-11-23 NOTE — Progress Notes (Signed)
Physical Therapy Session Note  Patient Details  Name: [name] MRN: [id number] Date of Birth: Apr 09, 1961  Today's Date: 11/23/2015 PT Individual Time: 6394-3200 PT Individual Time Calculation (min): 56 min   Short Term Goals: Week 2:  PT Short Term Goal 1 (Week 2): = LTGs due to anticipated LOS  Skilled Therapeutic Interventions/Progress Updates:    Handoff from NT in bathroom.  Pt with c/o pain in belly but does not rate, pt with bleeding nose at start of session.    Pt performs sit<>stand throughout session with RW and supervision for safety.  Pt demos appropriate hand placement and forward weight shift with wide BOS 2/2 edema.    PT instructed pt in stair negotiation 2x4 steps with 2 handrails for strengthening and balance with min assist on first stair and supervision for remaining stairs.  Rest break in between sets with pt reporting fatigue level at 6/10 after each set.  PT instructed pt in stair negotiation x4 steps with R handrail only to progress challenge to balance and strength and for progression towards stair negotiation with no handrails as pt does not have them at home.  Pt reporting fatigue level at 7/10 following stair negotiation with 1 rail.    Gait training x225' back to pt's room with RW and close supervision for safety.  Pt with noted increased respiratory rate with fatigue and PT instructed pt in pursed lip breathing, slowing rate of respirations, and pacing.  Pt verbalized understanding and returned demonstration.  Pt positioned in w/c with ELRs positioned in max elevated position with pillows for improved calf support.  Call bell in reach and needs met.   Therapy Documentation Precautions:  Precautions Precautions: Fall Restrictions Weight Bearing Restrictions: No   See Function Navigator for Current Functional Status.   Therapy/Group: Individual Therapy  Earnest Conroy Penven-Crew 11/23/2015, 12:05 PM

## 2015-11-23 NOTE — Progress Notes (Signed)
Occupational Therapy Session Note  Patient Details  Name: [name] MRN: [id number] Date of Birth: 12/17/1960  Today's Date: 11/23/2015 OT Individual Time: 1445-1557 OT Individual Time Calculation (min): 72 min    Short Term Goals: Week 2:  OT Short Term Goal 1 (Week 2): continue with downgraded LTG  Skilled Therapeutic Interventions/Progress Updates:    Upon entering the room, pt seated in wheelchair awaiting therapist with no c/o pain but fatigued from previous therapy sessions. Pt needing encouragement for participation in this session. Pt refusing ambulation secondary to fatigue but agreeable to propel wheelchair 150' to ADL apartment with supervision and increased time. Pt standing with RW and ambulating around bed to make up bed with steady assist and increased time. Pt standing for 5 minutes to complete tasks and required rest break after standing secondary to fatigue. OT educated pt on energy conservation principles for self care and home management. Pt verbalizing understanding and asking question as appropriate. Pt performed 2 sets of 5 wheelchair push ups in order to increase B UE strength. OT educating pt on pursed lip breathing with exercise and needing min verbal cues when pt demonstrated back to therapist. Pt returning to room by propelling wheelchair to room in same manner as above. Pt ambulating 5 feet in room with RW and steady assist to return to bed. Pt needing supervision for sit >supine. Bed alarm activated and call bell within reach upon exiting the room.   Therapy Documentation Precautions:  Precautions Precautions: Fall Restrictions Weight Bearing Restrictions: No  See Function Navigator for Current Functional Status.   Therapy/Group: Individual Therapy  Lowella Gripittman, Keneisha Heckart L 11/23/2015, 5:21 PM

## 2015-11-24 ENCOUNTER — Inpatient Hospital Stay (HOSPITAL_COMMUNITY): Payer: Federal, State, Local not specified - PPO | Admitting: Physical Therapy

## 2015-11-24 ENCOUNTER — Inpatient Hospital Stay (HOSPITAL_COMMUNITY): Payer: Federal, State, Local not specified - PPO | Admitting: Occupational Therapy

## 2015-11-24 ENCOUNTER — Inpatient Hospital Stay (HOSPITAL_COMMUNITY): Payer: Federal, State, Local not specified - PPO

## 2015-11-24 LAB — GLUCOSE, CAPILLARY
GLUCOSE-CAPILLARY: 108 mg/dL — AB (ref 65–99)
GLUCOSE-CAPILLARY: 119 mg/dL — AB (ref 65–99)
Glucose-Capillary: 128 mg/dL — ABNORMAL HIGH (ref 65–99)
Glucose-Capillary: 95 mg/dL (ref 65–99)

## 2015-11-24 NOTE — Progress Notes (Signed)
Occupational Therapy Session Note  Patient Details  Name: [name] MRN: [id number] Date of Birth: 09/21/1960  Today's Date: 11/24/2015 OT Individual Time: 0800-0900 OT Individual Time Calculation (min): 60 min    Short Term Goals: Week 2:  OT Short Term Goal 1 (Week 2): continue with downgraded LTG  Skilled Therapeutic Interventions/Progress Updates:    Pt resting in bed upon arrival and agreeable to therapy.  Pt sat EOB at supervision level. Once seated EOB pt requested use of toilet and amb with RW to bathroom at steady A level. Pt is not currently wearing pants secondary to LB edema but was able to complete toilet hygiene without assistance.  Pt returned to w/c at sink and completed grooming tasks while seated in w/c.  Pt transitioned to BUE therex (PNF) with weighted ball.  Pt required extended rest breaks between sets.  Focus on continued activity tolerance, sit<>stand, standing balance, functional transfers, functional amb with RW, and safety awareness to increase independence with BADLs.  Therapy Documentation Precautions:  Precautions Precautions: Fall Restrictions Weight Bearing Restrictions: No General:   Vital Signs:   Pain:  Pt c/o increased pain in RLE (hamstring?); RN aware and repositioned  See Function Navigator for Current Functional Status.   Therapy/Group: Individual Therapy  [name], [name] 11/24/2015, 9:03 AM

## 2015-11-24 NOTE — Progress Notes (Signed)
Occupational Therapy Session Note  Patient Details  Name: [name] MRN: [id number] Date of Birth: 02/08/1961  Today's Date: 11/24/2015 OT Individual Time: 1445-1530 OT Individual Time Calculation (min): 45 min    Short Term Goals: Week 2:  OT Short Term Goal 1 (Week 2): continue with downgraded LTG  Skilled Therapeutic Interventions/Progress Updates:    Pt originally scheduled at 1300, but was asleep and not willing to get up for therapy. Rescheduled pt for this session. Pt in w/c after PT session stating he did not want to do any standing as his feet were very uncomfortable. Placed folded towel on w/c leg rest peddles for comfort. Pt taken to gym to work on UE exercises for strength and activity tolerance which he was agreeable to.   Basketball holds reaching ball up/down, toss and catch, forward reaching 20 x each  Yellow Level 1 theraband for tricep extensions 15 x 2 Pilates arm circles in sh abd, sh overhead flex and sh forward flex to 90 10 reps each direction forward and back Arm bicycle for 8 min starting at level 1 and progressing to level 7 Self propelling w/c back to room Pt in room with all needs met.  Therapy Documentation Precautions:  Precautions Precautions: Fall Restrictions Weight Bearing Restrictions: No Therapy Vitals Temp: 98.4 F (36.9 C) Temp Source: Oral Pulse Rate: 99 Resp: 20 BP: 131/83 mmHg Patient Position (if appropriate): Sitting Oxygen Therapy SpO2: 100 % O2 Device: Not Delivered Pain: Pain Assessment Pain Assessment: 0-10 Pain Score: 7  Pain Type: Acute pain Pain Location: Leg Pain Orientation: Right Pain Descriptors / Indicators: Aching Pain Frequency: Intermittent Pain Onset: On-going Patients Stated Pain Goal: 2 Pain Intervention(s): Medication (See eMAR);Repositioned Multiple Pain Sites: No ADL:   See Function Navigator for Current Functional Status.   Therapy/Group: Individual Therapy  [place] 11/24/2015, 3:39  PM

## 2015-11-24 NOTE — Progress Notes (Signed)
Subjective/Complaints: Feels about the same, belly tight. Stools in same pattern, loose.  ROS: Denies CP, SOB, nausea, vomiting, diarrhea.  Objective: Vital Signs: Blood pressure 110/67, pulse 99, temperature 99.4 F (37.4 C), temperature source Oral, resp. rate 18, height 6' (1.829 m), weight 99.6 kg (219 lb 9.3 oz), SpO2 99 %. No results found. Results for orders placed or performed during the hospital encounter of 11/13/15 (from the past 72 hour(s))  Glucose, capillary     Status: None   Collection Time: 11/21/15 11:21 AM  Result Value Ref Range   Glucose-Capillary 97 65 - 99 mg/dL   Comment 1 Notify RN   Glucose, capillary     Status: Abnormal   Collection Time: 11/21/15  5:23 PM  Result Value Ref Range   Glucose-Capillary 113 (H) 65 - 99 mg/dL  Glucose, capillary     Status: Abnormal   Collection Time: 11/21/15  8:40 PM  Result Value Ref Range   Glucose-Capillary 109 (H) 65 - 99 mg/dL   Comment 1 Notify RN   Basic metabolic panel     Status: Abnormal   Collection Time: 11/22/15  5:57 AM  Result Value Ref Range   Sodium 130 (L) 135 - 145 mmol/L   Potassium 4.8 3.5 - 5.1 mmol/L   Chloride 101 101 - 111 mmol/L   CO2 18 (L) 22 - 32 mmol/L   Glucose, Bld 109 (H) 65 - 99 mg/dL   BUN 25 (H) 6 - 20 mg/dL   Creatinine, Ser 1.67 (H) 0.61 - 1.24 mg/dL   Calcium 8.2 (L) 8.9 - 10.3 mg/dL   GFR calc non Af Amer 45 (L) >60 mL/min   GFR calc Af Amer 52 (L) >60 mL/min    Comment: (NOTE) The eGFR has been calculated using the CKD EPI equation. This calculation has not been validated in all clinical situations. eGFR's persistently <60 mL/min signify possible Chronic Kidney Disease.    Anion gap 11 5 - 15  Glucose, capillary     Status: None   Collection Time: 11/22/15  6:38 AM  Result Value Ref Range   Glucose-Capillary 98 65 - 99 mg/dL   Comment 1 Notify RN   Glucose, capillary     Status: Abnormal   Collection Time: 11/22/15 11:46 AM  Result Value Ref Range   Glucose-Capillary 129 (H) 65 - 99 mg/dL  Glucose, capillary     Status: Abnormal   Collection Time: 11/22/15  4:27 PM  Result Value Ref Range   Glucose-Capillary 115 (H) 65 - 99 mg/dL  Glucose, capillary     Status: Abnormal   Collection Time: 11/22/15  9:08 PM  Result Value Ref Range   Glucose-Capillary 60 (L) 65 - 99 mg/dL  Glucose, capillary     Status: Abnormal   Collection Time: 11/22/15  9:40 PM  Result Value Ref Range   Glucose-Capillary 116 (H) 65 - 99 mg/dL  Hepatic function panel     Status: Abnormal   Collection Time: 11/23/15  6:38 AM  Result Value Ref Range   Total Protein 6.9 6.5 - 8.1 g/dL   Albumin 2.1 (L) 3.5 - 5.0 g/dL   AST 67 (H) 15 - 41 U/L   ALT 35 17 - 63 U/L   Alkaline Phosphatase 62 38 - 126 U/L   Total Bilirubin 15.1 (H) 0.3 - 1.2 mg/dL   Bilirubin, Direct 5.7 (H) 0.1 - 0.5 mg/dL   Indirect Bilirubin 9.4 (H) 0.3 - 0.9 mg/dL  Glucose, capillary  Status: None   Collection Time: 11/23/15  6:58 AM  Result Value Ref Range   Glucose-Capillary 85 65 - 99 mg/dL  Glucose, capillary     Status: None   Collection Time: 11/23/15 12:13 PM  Result Value Ref Range   Glucose-Capillary 99 65 - 99 mg/dL  Glucose, capillary     Status: Abnormal   Collection Time: 11/23/15  4:54 PM  Result Value Ref Range   Glucose-Capillary 108 (H) 65 - 99 mg/dL  Glucose, capillary     Status: Abnormal   Collection Time: 11/23/15  9:26 PM  Result Value Ref Range   Glucose-Capillary 107 (H) 65 - 99 mg/dL  Glucose, capillary     Status: Abnormal   Collection Time: 11/24/15  6:47 AM  Result Value Ref Range   Glucose-Capillary 108 (H) 65 - 99 mg/dL     HEENT: Normocephalic. icteric Cardio: Regular rate. Regular rhythm. no murmurs Resp: CTA B/L and unlabored GI: Distention and ascites, appears c/w yesterday  Musculoskeletal:  Edema in both thighs and the feet. No tenderness Skin:   Other jaundice. Warm and dry. Neuro: Alert/Oriented Motor: 4+/5 bilateral deltoid, biceps,  triceps, grip, 2/5 hip flexor knee extensor 4-/5 ankle dorsiflexor and plantar flexor Gen. no acute distress.  GU: scrotal edema  Assessment/Plan: 1. Functional deficits secondary to Debilitation secondary to respiratory failure/sepsis, complications from cirrhosis of liver/ascites. which require 3+ hours per day of interdisciplinary therapy in a comprehensive inpatient rehab setting. Physiatrist is providing close team supervision and 24 hour management of active medical problems listed below. Physiatrist and rehab team continue to assess barriers to discharge/monitor patient progress toward functional and medical goals. FIM: Function - Bathing Bathing activity did not occur: Refused Position: Wheelchair/chair at sink Body parts bathed by patient: Right arm, Left arm, Chest, Abdomen, Right upper leg, Left upper leg, Front perineal area Body parts bathed by helper: Right lower leg, Left lower leg, Back, Buttocks Assist Level: Touching or steadying assistance(Pt > 75%)  Function- Upper Body Dressing/Undressing What is the patient wearing?: Pull over shirt/dress, Hospital gown Pull over shirt/dress - Perfomed by patient: Thread/unthread right sleeve, Thread/unthread left sleeve, Put head through opening, Pull shirt over trunk Pull over shirt/dress - Perfomed by helper: Pull shirt over trunk Assist Level: Set up Function - Lower Body Dressing/Undressing What is the patient wearing?: Non-skid slipper socks Position: Wheelchair/chair at sink Underwear - Performed by helper: Thread/unthread right underwear leg, Thread/unthread left underwear leg, Pull underwear up/down Pants- Performed by patient: Pull pants up/down, Thread/unthread left pants leg, Thread/unthread right pants leg Pants- Performed by helper: Pull pants up/down, Thread/unthread left pants leg, Thread/unthread right pants leg Non-skid slipper socks- Performed by helper: Don/doff right sock, Don/doff left sock Shoes - Performed by  helper: Don/doff right shoe, Don/doff left shoe, Fasten right, Fasten left Assist for footwear: Dependant Assist for lower body dressing: Touching or steadying assistance (Pt > 75%)  Function - Toileting Toileting activity did not occur: N/A Toileting steps completed by patient: Adjust clothing prior to toileting, Adjust clothing after toileting, Performs perineal hygiene Toileting steps completed by helper: Adjust clothing prior to toileting, Adjust clothing after toileting Toileting Assistive Devices: Grab bar or rail Assist level: Touching or steadying assistance (Pt.75%)  Function - Air cabin crew transfer activity did not occur: N/A Toilet transfer assistive device: Grab bar Assist level to toilet: Moderate assist (Pt 50 - 74%/lift or lower) Assist level from toilet: Moderate assist (Pt 50 - 74%/lift or lower)  Function - Chair/bed transfer  Chair/bed transfer activity did not occur: N/A Chair/bed transfer method: Stand pivot Chair/bed transfer assist level: Touching or steadying assistance (Pt > 75%) Chair/bed transfer assistive device: Armrests, Walker Chair/bed transfer details: Verbal cues for technique, Verbal cues for precautions/safety, Verbal cues for safe use of DME/AE  Function - Locomotion: Wheelchair Will patient use wheelchair at discharge?: Yes Type: Manual Max wheelchair distance: 263f Assist Level: Supervision or verbal cues Assist Level: Supervision or verbal cues Wheel 150 feet activity did not occur: Safety/medical concerns Assist Level: Supervision or verbal cues Turns around,maneuvers to table,bed, and toilet,negotiates 3% grade,maneuvers on rugs and over doorsills: No Function - Locomotion: Ambulation Assistive device: Walker-rolling Max distance: 225 Assist level: Supervision or verbal cues Assist level: Supervision or verbal cues Assist level: Supervision or verbal cues Walk 150 feet activity did not occur: Safety/medical concerns Assist  level: Supervision or verbal cues Assist level: Moderate assist (Pt 50 - 74%)  Function - Comprehension Comprehension: Auditory Comprehension assist level: Understands basic 90% of the time/cues < 10% of the time  Function - Expression Expression: Verbal Expression assist level: Expresses basic needs/ideas: With no assist  Function - Social Interaction Social Interaction assist level: Interacts appropriately 90% of the time - Needs monitoring or encouragement for participation or interaction.  Function - Problem Solving Problem solving assist level: Solves basic 75 - 89% of the time/requires cueing 10 - 24% of the time  Function - Memory Memory assist level: Recognizes or recalls 50 - 74% of the time/requires cueing 25 - 49% of the time Patient normally able to recall (first 3 days only): Current season, Location of own room, Staff names and faces, That he or she is in a hospital    Medical Problem List and Plan: 1.  Debilitation secondary to respiratory failure/sepsis, complications from cirrhosis of liver/ascites. Status post paracentesis 11/02/2015             -continue CIR. Team conf today 2.  DVT Prophylaxis/Anticoagulation: SCDs. Monitor for any signs of DVT 3. Pain Management: Tylenol as needed 4. Anemia/thrombocytopenia question DIC related to cirrhosis. Status post transfused multiple units. CBC Latest Ref Rng 11/16/2015 11/12/2015 11/11/2015  WBC 4.0 - 10.5 K/uL 11.5(H) 13.3(H) 13.5(H)  Hemoglobin 13.0 - 17.0 g/dL 9.1(L) 8.6(L) 8.6(L)  Hematocrit 39.0 - 52.0 % 27.6(L) 26.3(L) 25.4(L)  Platelets 150 - 400 K/uL 173 149(L) 141(L)    5. Neuropsych: This patient is capable of making decisions on his own behalf. 6. Skin/Wound Care: Routine skin checks 7. Fluids/Electrolytes/Nutrition: encourage po as possible. Appetite better with rx of ascites 8. Hypertension. No current antihypertensive medication. Follow with increased mobility 9. Acute renal insufficiency. BUN/Cr holding  despite diuretics---recheck tomorrow BMP Latest Ref Rng 11/22/2015 11/21/2015 11/20/2015  Glucose 65 - 99 mg/dL 109(H) 96 87  BUN 6 - 20 mg/dL 25(H) 29(H) 27(H)  Creatinine 0.61 - 1.24 mg/dL 1.67(H) 1.60(H) 1.52(H)  Sodium 135 - 145 mmol/L 130(L) 131(L) 133(L)  Potassium 3.5 - 5.1 mmol/L 4.8 5.4(H) 5.9(H)  Chloride 101 - 111 mmol/L 101 101 102  CO2 22 - 32 mmol/L 18(L) 19(L) 20(L)  Calcium 8.9 - 10.3 mg/dL 8.2(L) 8.2(L) 8.2(L)    10. Sinus tachycardia with SVT versus atrial fibrillation. Amiodarone 200 mg daily.  11. Liver failure with ascites:  S/p numerous paracenteses  -apprec GI help---f/u?  -aldactone added, lasix decreased--adjust further as needed/tolerated   -?TIPS    -LFT's fairly stable, recent ammonia level ok----recheck tomorrow---?decrease lactulose sl?  -repeat paracentesis needed it appears 12. Penile/scrotal edema----local care/lidocaine  gel for comfort, nystatin for skin  -  rx of #11 13. Diarrhea: soft to loose now  -continue probiotic  -?sl decr lactulose  LOS (Days) 11 A FACE TO FACE EVALUATION WAS PERFORMED  [name] T 11/24/2015, 8:39 AM

## 2015-11-24 NOTE — Progress Notes (Signed)
Physical Therapy Session Note  Patient Details  Name: [name] MRN: [id number] Date of Birth: 06/21/1961  Today's Date: 11/24/2015 PT Individual Time: 0900-1000 and 1400-1430 PT Individual Time Calculation (min): 60 min and 30 min  Short Term Goals: Week 2:  PT Short Term Goal 1 (Week 2): = LTGs due to anticipated LOS  Skilled Therapeutic Interventions/Progress Updates:   Treatment 1: Session focused on functional mobility training, endurance, and discharge planning. Patient in wheelchair, electing to propel wheelchair to gym x 220 ft with supervision and verbal cues for efficient technique, multiple rest breaks due to fatigue. Patient required max verbal/visual cues for doffing ELRs. Engaged in discussion regarding DME needs, home setup and accessibility, and recommendation for 24/7 supervision with patient reporting that brother and partner will be able to provide intermittent supervision. Gait training using RW 2 x 50 ft with supervision. Instructed in negotiating up/down 6" curb step x 2 to simulate home entry with supervision and verbal cues for safe sequencing and technique. Patient propelled wheelchair back to room with increased time. Patient required prolonged seated rest breaks with all mobility due to fatigue and RLE > LLE pain. Patient left sitting in wheelchair with all needs in reach.   Treatment 2: Patient in bed upon arrival. Transferred to edge of bed with HOB flat and no rail to simulate home environment with supervision. Performed sit <> stand transfers throughout session with supervision. Performed sit <> stand from EOB and patient's gown/bed noted to be wet. Donned clean gown and patient ambulated from room <> BI gym using RW with close supervision and seated rest between trials. Patient required prolonged seated rest break and increased time to perform all mobility due to SOB, fatigue, and increased RLE pain. Patient left sitting in wheelchair, declined to put BLE on ELRs  with all needs in reach.   Therapy Documentation Precautions:  Precautions Precautions: Fall Restrictions Weight Bearing Restrictions: No Pain: Pain Assessment Pain Assessment: 0-10 Pain Score: 8  Pain Type: Acute pain Pain Location: Leg Pain Orientation: Right Pain Descriptors / Indicators: Aching;Tightness Pain Onset: On-going Pain Intervention(s): Repositioned;Ambulation/increased activity;Rest   See Function Navigator for Current Functional Status.   Therapy/Group: Individual Therapy  [name], Jasmyne Lodato A 11/24/2015, 9:55 AM

## 2015-11-25 ENCOUNTER — Inpatient Hospital Stay (HOSPITAL_COMMUNITY): Payer: Federal, State, Local not specified - PPO | Admitting: Physical Therapy

## 2015-11-25 ENCOUNTER — Inpatient Hospital Stay (HOSPITAL_COMMUNITY): Payer: Federal, State, Local not specified - PPO

## 2015-11-25 DIAGNOSIS — K766 Portal hypertension: Secondary | ICD-10-CM

## 2015-11-25 LAB — GLUCOSE, CAPILLARY
GLUCOSE-CAPILLARY: 99 mg/dL (ref 65–99)
Glucose-Capillary: 89 mg/dL (ref 65–99)
Glucose-Capillary: 79 mg/dL (ref 65–99)
Glucose-Capillary: 113 mg/dL — ABNORMAL HIGH (ref 65–99)

## 2015-11-25 LAB — COMPREHENSIVE METABOLIC PANEL
ALBUMIN: 2 g/dL — AB (ref 3.5–5.0)
ALK PHOS: 55 U/L (ref 38–126)
ALT: 34 U/L (ref 17–63)
ANION GAP: 12 (ref 5–15)
AST: 61 U/L — ABNORMAL HIGH (ref 15–41)
BUN: 25 mg/dL — ABNORMAL HIGH (ref 6–20)
CALCIUM: 8 mg/dL — AB (ref 8.9–10.3)
CO2: 18 mmol/L — AB (ref 22–32)
CREATININE: 1.62 mg/dL — AB (ref 0.61–1.24)
Chloride: 102 mmol/L (ref 101–111)
GFR calc Af Amer: 54 mL/min — ABNORMAL LOW (ref >60)
GFR calc non Af Amer: 47 mL/min — ABNORMAL LOW (ref >60)
GLUCOSE: 98 mg/dL (ref 65–99)
Potassium: 4 mmol/L (ref 3.5–5.1)
SODIUM: 132 mmol/L — AB (ref 135–145)
Total Bilirubin: 14.4 mg/dL — ABNORMAL HIGH (ref 0.3–1.2)
Total Protein: 7 g/dL (ref 6.5–8.1)

## 2015-11-25 LAB — AMMONIA: Ammonia: 70 umol/L — ABNORMAL HIGH (ref 9–35)

## 2015-11-25 MED ORDER — TRAMADOL HCL 50 MG PO TABS
25.0000 mg | ORAL_TABLET | Freq: Four times a day (QID) | ORAL | Status: DC | PRN
  Administered 2015-11-25 – 2015-11-27 (×5): 50 mg via ORAL
  Filled 2015-11-25 (×5): qty 1

## 2015-11-25 MED ORDER — ALBUMIN HUMAN 25 % IV SOLN
50.0000 g | Freq: Once | INTRAVENOUS | Status: AC
  Administered 2015-11-26: 50 g via INTRAVENOUS
  Filled 2015-11-25: qty 200

## 2015-11-25 NOTE — Plan of Care (Signed)
Problem: RH Dressing Goal: LTG Patient will perform lower body dressing w/assist (OT) LTG: Patient will perform lower body dressing with assist, with/without cues in positioning using equipment (OT)  LB dressing downgraded to min A.

## 2015-11-25 NOTE — Progress Notes (Signed)
Physical Therapy Session Note  Patient Details  Name: [name] MRN: [id number] Date of Birth: 01/18/1961  Today's Date: 11/25/2015 PT Individual Time: 0800-0900 PT Individual Time Calculation (min): 60 min   Short Term Goals: Week 2:  PT Short Term Goal 1 (Week 2): = LTGs due to anticipated LOS  Skilled Therapeutic Interventions/Progress Updates:   Patient in bed upon arrival, required increased time to sit edge of bed with HOB flat. Brother present at beginning of session and reports he will email therapist home measurements. Patient transferred to wheelchair using RW with supervision and brushed teeth and combed hair at wheelchair level with increased time. Gait training room > therapy gym using RW x 230 ft with supervision and increased time. Patient required prolonged seated rest break due to SOB and RLE pain. During rest break, therapist retrieved clean wheelchair cushion as patient reported that catheter had leaked on previous cushion yesterday. Patient ambulated to NuStep in gym using RW with supervision. Performed NuStep using BUE/BLE at level 4, 2 x 5 min with brief rest break to challenge endurance and ROM BLE. Patient reporting slight improvement in RLE pain after NuStep. Patient transferred back to wheelchair using RW with supervision, declined further ambulation due to fatigue/pain. Patient left sitting in wheelchair with all needs in reach.      Therapy Documentation Precautions:  Precautions Precautions: Fall Restrictions Weight Bearing Restrictions: No Pain: Pain Assessment Pain Assessment: 0-10 Pain Score: 7  Pain Type: Acute pain Pain Location: Leg Pain Orientation: Right Pain Descriptors / Indicators: Aching Pain Onset: On-going Pain Intervention(s): Repositioned;Ambulation/increased activity;Rest   See Function Navigator for Current Functional Status.   Therapy/Group: Individual Therapy  [name], [name] 11/25/2015, 8:56 AM

## 2015-11-25 NOTE — Progress Notes (Signed)
Occupational Therapy Session Note  Patient Details  Name: [name] MRN: [id number] Date of Birth: 10/26/1960  Today's Date: 11/25/2015 OT Individual Time: 1100-1200 OT Individual Time Calculation (min): 60 min    Short Term Goals: Week 2:  OT Short Term Goal 1 (Week 2): continue with downgraded LTG  Skilled Therapeutic Interventions/Progress Updates:    Pt resting in w/c upon arrival.  Pt declined bathing this morning but agreeable to a "sink bath" tomorrow.  Suggested a shower and pt declined stating "it has been so long I don't how I would react." Pt propelled to therapy gym and engaged in w/c mobility and BUE therex on [employer].  Focus on increased activity tolerance, BUE strengthening, w/c mobility, and safety awareness to increase independence with BADLs.  After discussion with OTR, pt's LB dressing LTG downgraded to min A secondary to BLE edema.   Therapy Documentation Precautions:  Precautions Precautions: Fall Restrictions Weight Bearing Restrictions: No  Pain: Pain Assessment Pain Assessment: 0-10 Pain Score: 7  Pain Type: Acute pain Pain Location: Leg Pain Orientation: Right Pain Descriptors / Indicators: Aching Pain Onset: On-going Pain Intervention(s): Repositioned;Ambulation/increased activity;Rest  See Function Navigator for Current Functional Status.   Therapy/Group: Individual Therapy  Rich BraveLanier, Colton Chappell 11/25/2015, 12:05 PM

## 2015-11-25 NOTE — Progress Notes (Signed)
Subjective/Complaints: Belly remains distended. Trying to eat what he can. Noticed some leakage from around foley   ROS: Denies CP, SOB, nausea, vomiting, diarrhea.  Objective: Vital Signs: Blood pressure 112/72, pulse 101, temperature 98.2 F (36.8 C), temperature source Oral, resp. rate 18, height 6' (1.829 m), weight 98.7 kg (217 lb 9.5 oz), SpO2 100 %. No results found. Results for orders placed or performed during the hospital encounter of 11/13/15 (from the past 72 hour(s))  Glucose, capillary     Status: Abnormal   Collection Time: 11/22/15 11:46 AM  Result Value Ref Range   Glucose-Capillary 129 (H) 65 - 99 mg/dL  Glucose, capillary     Status: Abnormal   Collection Time: 11/22/15  4:27 PM  Result Value Ref Range   Glucose-Capillary 115 (H) 65 - 99 mg/dL  Glucose, capillary     Status: Abnormal   Collection Time: 11/22/15  9:08 PM  Result Value Ref Range   Glucose-Capillary 60 (L) 65 - 99 mg/dL  Glucose, capillary     Status: Abnormal   Collection Time: 11/22/15  9:40 PM  Result Value Ref Range   Glucose-Capillary 116 (H) 65 - 99 mg/dL  Hepatic function panel     Status: Abnormal   Collection Time: 11/23/15  6:38 AM  Result Value Ref Range   Total Protein 6.9 6.5 - 8.1 g/dL   Albumin 2.1 (L) 3.5 - 5.0 g/dL   AST 67 (H) 15 - 41 U/L   ALT 35 17 - 63 U/L   Alkaline Phosphatase 62 38 - 126 U/L   Total Bilirubin 15.1 (H) 0.3 - 1.2 mg/dL   Bilirubin, Direct 5.7 (H) 0.1 - 0.5 mg/dL   Indirect Bilirubin 9.4 (H) 0.3 - 0.9 mg/dL  Glucose, capillary     Status: None   Collection Time: 11/23/15  6:58 AM  Result Value Ref Range   Glucose-Capillary 85 65 - 99 mg/dL  Glucose, capillary     Status: None   Collection Time: 11/23/15 12:13 PM  Result Value Ref Range   Glucose-Capillary 99 65 - 99 mg/dL  Glucose, capillary     Status: Abnormal   Collection Time: 11/23/15  4:54 PM  Result Value Ref Range   Glucose-Capillary 108 (H) 65 - 99 mg/dL  Glucose, capillary     Status:  Abnormal   Collection Time: 11/23/15  9:26 PM  Result Value Ref Range   Glucose-Capillary 107 (H) 65 - 99 mg/dL  Glucose, capillary     Status: Abnormal   Collection Time: 11/24/15  6:47 AM  Result Value Ref Range   Glucose-Capillary 108 (H) 65 - 99 mg/dL  Glucose, capillary     Status: Abnormal   Collection Time: 11/24/15 11:31 AM  Result Value Ref Range   Glucose-Capillary 128 (H) 65 - 99 mg/dL   Comment 1 Notify RN   Glucose, capillary     Status: None   Collection Time: 11/24/15  4:32 PM  Result Value Ref Range   Glucose-Capillary 95 65 - 99 mg/dL   Comment 1 Notify RN   Glucose, capillary     Status: Abnormal   Collection Time: 11/24/15  9:08 PM  Result Value Ref Range   Glucose-Capillary 119 (H) 65 - 99 mg/dL  Glucose, capillary     Status: None   Collection Time: 11/25/15  6:40 AM  Result Value Ref Range   Glucose-Capillary 89 65 - 99 mg/dL  Ammonia     Status: Abnormal   Collection Time: 11/25/15  6:41 AM  Result Value Ref Range   Ammonia 70 (H) 9 - 35 umol/L  Comprehensive metabolic panel     Status: Abnormal   Collection Time: 11/25/15  6:41 AM  Result Value Ref Range   Sodium 132 (L) 135 - 145 mmol/L   Potassium 4.0 3.5 - 5.1 mmol/L   Chloride 102 101 - 111 mmol/L   CO2 18 (L) 22 - 32 mmol/L   Glucose, Bld 98 65 - 99 mg/dL   BUN 25 (H) 6 - 20 mg/dL   Creatinine, Ser 1.62 (H) 0.61 - 1.24 mg/dL   Calcium 8.0 (L) 8.9 - 10.3 mg/dL   Total Protein 7.0 6.5 - 8.1 g/dL   Albumin 2.0 (L) 3.5 - 5.0 g/dL   AST 61 (H) 15 - 41 U/L   ALT 34 17 - 63 U/L   Alkaline Phosphatase 55 38 - 126 U/L   Total Bilirubin 14.4 (H) 0.3 - 1.2 mg/dL   GFR calc non Af Amer 47 (L) >60 mL/min   GFR calc Af Amer 54 (L) >60 mL/min    Comment: (NOTE) The eGFR has been calculated using the CKD EPI equation. This calculation has not been validated in all clinical situations. eGFR's persistently <60 mL/min signify possible Chronic Kidney Disease.    Anion gap 12 5 - 15     HEENT:  Normocephalic. icteric Cardio: Regular rate. Regular rhythm. no murmurs Resp: CTA B/L and unlabored GI: Distention and ascites, appears c/w yesterday  Musculoskeletal:  Edema in both thighs and the feet. No tenderness Skin:   Other jaundice. Warm and dry. Neuro: Alert/Oriented Motor: 4+/5 bilateral deltoid, biceps, triceps, grip, 2/5 hip flexor knee extensor 4-/5 ankle dorsiflexor and plantar flexor Gen. no acute distress.  GU: scrotal edema/penile edema unchanged. Dried urine on bedsheet  Assessment/Plan: 1. Functional deficits secondary to Debilitation secondary to respiratory failure/sepsis, complications from cirrhosis of liver/ascites. which require 3+ hours per day of interdisciplinary therapy in a comprehensive inpatient rehab setting. Physiatrist is providing close team supervision and 24 hour management of active medical problems listed below. Physiatrist and rehab team continue to assess barriers to discharge/monitor patient progress toward functional and medical goals. FIM: Function - Bathing Bathing activity did not occur: Refused Position: Wheelchair/chair at sink Body parts bathed by patient: Right arm, Left arm, Chest, Abdomen, Right upper leg, Left upper leg, Front perineal area Body parts bathed by helper: Right lower leg, Left lower leg, Back, Buttocks Assist Level: Touching or steadying assistance(Pt > 75%)  Function- Upper Body Dressing/Undressing What is the patient wearing?: Pull over shirt/dress, Hospital gown Pull over shirt/dress - Perfomed by patient: Thread/unthread right sleeve, Thread/unthread left sleeve, Put head through opening, Pull shirt over trunk Pull over shirt/dress - Perfomed by helper: Pull shirt over trunk Assist Level: Set up Function - Lower Body Dressing/Undressing What is the patient wearing?: Non-skid slipper socks Position: Wheelchair/chair at sink Underwear - Performed by helper: Thread/unthread right underwear leg, Thread/unthread left  underwear leg, Pull underwear up/down Pants- Performed by patient: Pull pants up/down, Thread/unthread left pants leg, Thread/unthread right pants leg Pants- Performed by helper: Pull pants up/down, Thread/unthread left pants leg, Thread/unthread right pants leg Non-skid slipper socks- Performed by helper: Don/doff right sock, Don/doff left sock Shoes - Performed by helper: Don/doff right shoe, Don/doff left shoe, Fasten right, Fasten left Assist for footwear: Dependant Assist for lower body dressing: Touching or steadying assistance (Pt > 75%)  Function - Toileting Toileting activity did not occur: N/A Toileting steps completed  by patient: Adjust clothing prior to toileting, Adjust clothing after toileting Toileting steps completed by helper: Performs perineal hygiene Toileting Assistive Devices: Grab bar or rail Assist level: Touching or steadying assistance (Pt.75%)  Function - Air cabin crew transfer activity did not occur: N/A Toilet transfer assistive device: Elevated toilet seat/BSC over toilet Assist level to toilet: Touching or steadying assistance (Pt > 75%) Assist level from toilet: Touching or steadying assistance (Pt > 75%)  Function - Chair/bed transfer Chair/bed transfer activity did not occur: N/A Chair/bed transfer method: Ambulatory, Stand pivot Chair/bed transfer assist level: Supervision or verbal cues Chair/bed transfer assistive device: Armrests, Walker Chair/bed transfer details: Verbal cues for technique, Verbal cues for precautions/safety, Verbal cues for safe use of DME/AE  Function - Locomotion: Wheelchair Will patient use wheelchair at discharge?: Yes Type: Manual Max wheelchair distance: 220 Assist Level: Supervision or verbal cues Assist Level: Supervision or verbal cues Wheel 150 feet activity did not occur: Safety/medical concerns Assist Level: Supervision or verbal cues Turns around,maneuvers to table,bed, and toilet,negotiates 3%  grade,maneuvers on rugs and over doorsills: No Function - Locomotion: Ambulation Assistive device: Walker-rolling Max distance: 230 ft Assist level: Supervision or verbal cues Assist level: Supervision or verbal cues Assist level: Supervision or verbal cues Walk 150 feet activity did not occur: Safety/medical concerns Assist level: Supervision or verbal cues Assist level: Moderate assist (Pt 50 - 74%)  Function - Comprehension Comprehension: Auditory Comprehension assist level: Understands complex 90% of the time/cues 10% of the time  Function - Expression Expression: Verbal Expression assist level: Expresses basic needs/ideas: With no assist  Function - Social Interaction Social Interaction assist level: Interacts appropriately with others with medication or extra time (anti-anxiety, antidepressant).  Function - Problem Solving Problem solving assist level: Solves basic 75 - 89% of the time/requires cueing 10 - 24% of the time  Function - Memory Memory assist level: Recognizes or recalls 50 - 74% of the time/requires cueing 25 - 49% of the time Patient normally able to recall (first 3 days only): Current season, Location of own room, Staff names and faces, That he or she is in a hospital    Medical Problem List and Plan: 1.  Debilitation secondary to respiratory failure/sepsis, complications from cirrhosis of liver/ascites. Status post paracentesis 11/02/2015             -continue CIR.   2.  DVT Prophylaxis/Anticoagulation: SCDs. Monitor for any signs of DVT 3. Pain Management: Tylenol as needed 4. Anemia/thrombocytopenia question DIC related to cirrhosis. Status post transfused multiple units. CBC Latest Ref Rng 11/16/2015 11/12/2015 11/11/2015  WBC 4.0 - 10.5 K/uL 11.5(H) 13.3(H) 13.5(H)  Hemoglobin 13.0 - 17.0 g/dL 9.1(L) 8.6(L) 8.6(L)  Hematocrit 39.0 - 52.0 % 27.6(L) 26.3(L) 25.4(L)  Platelets 150 - 400 K/uL 173 149(L) 141(L)    5. Neuropsych: This patient is capable of  making decisions on his own behalf. 6. Skin/Wound Care: Routine skin checks 7. Fluids/Electrolytes/Nutrition: encourage po as possible. Appetite better with rx of ascites 8. Hypertension. No current antihypertensive medication. Follow with increased mobility 9. Acute renal insufficiency. BUN/Cr holding despite diuretics---reviewed labs today  BMP Latest Ref Rng 11/25/2015 11/22/2015 11/21/2015  Glucose 65 - 99 mg/dL 98 109(H) 96  BUN 6 - 20 mg/dL 25(H) 25(H) 29(H)  Creatinine 0.61 - 1.24 mg/dL 1.62(H) 1.67(H) 1.60(H)  Sodium 135 - 145 mmol/L 132(L) 130(L) 131(L)  Potassium 3.5 - 5.1 mmol/L 4.0 4.8 5.4(H)  Chloride 101 - 111 mmol/L 102 101 101  CO2 22 - 32  mmol/L 18(L) 18(L) 19(L)  Calcium 8.9 - 10.3 mg/dL 8.0(L) 8.2(L) 8.2(L)    10. Sinus tachycardia with SVT versus atrial fibrillation. Amiodarone 200 mg daily.  11. Liver failure with ascites:  S/p numerous paracenteses  -need GI assistance with long range plan for this patient  -aldactone/lasix   -?TIPS    -LFT's remain fairly stable, recent ammonia level ok-  -repeat paracentesis needed  12. Penile/scrotal edema----local care/lidocaine gel for comfort, nystatin for skin  -  rx of #11  -adjust/change foley to decrease leakage 13. Diarrhea: soft to loose now  -continue probiotic  -?sl decr lactulose---only one loose stool yesterday  LOS (Days) 12 A FACE TO FACE EVALUATION WAS PERFORMED  SWARTZ,ZACHARY T 11/25/2015, 8:47 AM

## 2015-11-25 NOTE — Progress Notes (Signed)
Daily Rounding Note  11/25/2015, 10:39 AM  LOS: 12 days   SUBJECTIVE:       Intermittent nausea, no emesis.  Po intake 75 to 100% of meals.  2 to 3 BMs daily, one this AM.  Generalized abdominal discomfort and bloating.  Getting prn Acetaminophen (APAP), from 650 to 1300 per 24 hours.  Recorded weights indicate decrease to 214 on 5/7, increase to 219# 5/9, 217 today. However weights are done with bed scale Able to walk unassisted using walker around hall into gym.   OBJECTIVE:         Vital signs in last 24 hours:    Temp:  [98.2 F (36.8 C)-98.4 F (36.9 C)] 98.2 F (36.8 C) (05/10 0422) Pulse Rate:  [99-101] 101 (05/10 0422) Resp:  [18-20] 18 (05/10 0422) BP: (112-131)/(72-83) 112/72 mmHg (05/10 0422) SpO2:  [100 %] 100 % (05/10 0422) Weight:  [98.7 kg (217 lb 9.5 oz)] 98.7 kg (217 lb 9.5 oz) (05/10 0422) Last BM Date: 11/23/15 Filed Weights   11/23/15 0453 11/24/15 0404 11/25/15 0422  Weight: 99.1 kg (218 lb 7.6 oz) 99.6 kg (219 lb 9.3 oz) 98.7 kg (217 lb 9.5 oz)   General: jaundiced, more so than last week.     Heart: regular, slightly tachy. Chest: clear bil.  No dyspnea or cough Abdomen: tense, distended, BS tympanitic.  Slightly tender across entire belly.   Extremities: ongoing massive anasarca, LE edema.   Neuro/Psych:  Oriented x 3.   Some fine UE/hand tremor but no asterixis.  Moves all 4s.   Intake/Output from previous day: 05/09 0701 - 05/10 0700 In: 840 [P.O.:840] Out: 1500 [Urine:1500]  Intake/Output this shift: Total I/O In: 480 [P.O.:480] Out: -   Lab Results: No results for input(s): WBC, HGB, HCT, PLT in the last 72 hours. BMET  Recent Labs  11/25/15 0641  NA 132*  K 4.0  CL 102  CO2 18*  GLUCOSE 98  BUN 25*  CREATININE 1.62*  CALCIUM 8.0*   LFT  Recent Labs  11/23/15 0638 11/25/15 0641  PROT 6.9 7.0  ALBUMIN 2.1* 2.0*  AST 67* 61*  ALT 35 34  ALKPHOS 62 55  BILITOT  15.1* 14.4*  BILIDIR 5.7*  --   IBILI 9.4*  --    PT/INR   Ref. Range 11/19/2015 05:46  Prothrombin Time Latest Ref Range: 11.6-15.2 seconds 28.4 (H)  INR Latest Ref Range: 0.00-1.49  2.71 (H)    Hepatitis Panel No results for input(s): HEPBSAG, HCVAB, HEPAIGM, HEPBIGM in the last 72 hours.  Studies/Results: No results found.  Scheduled Meds: . amiodarone  200 mg Oral Daily  . folic acid  1 mg Oral Daily  . furosemide  60 mg Oral Daily  . insulin aspart  0-9 Units Subcutaneous TID WC  . lactulose  30 g Oral Daily  . loratadine  10 mg Oral Daily  . nystatin   Topical BID  . oxymetazoline  1 spray Each Nare BID  . pantoprazole  40 mg Oral Daily  . saccharomyces boulardii  250 mg Oral BID  . spironolactone  150 mg Oral Daily  . 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 Hep ABC negative.  Ascites, s/p paracentesis x 4, latest (4.8 liter tap) was 5/2. No SBP on 4/19 or 4/24 taps.  Aldactone 150 added to Lasix 60 on 5/2. Wt down overall.   Remains jaundiced, t bili overall tripled since admission.   * Coagulopathy. No overt bleeding.   * AKI. Oliguria improved. Renal function compromised but stable GFR, stage 3 CKD.    *  Hyponatremia, improved, non-critical.    * Severe anemia, multifactorial, long-standing. Though FOBT + on 4/15, no overt CG, blood per NGT and no melena or BPR. No evidence varices on CT scan. EGD deferred by Dr Leone PayorGessner.  Hgb improving.   * Protein cal malnutrition. Appetite improved.   * Encephalopathy, hepatic and metabolic. Max ammonia of just 57. Much improved MS c/w admission. Having 2 to 3 stools per day on Lactulose 30 gm daily.    PLAN   *  Leave on current dose of 60/150 lasix to aldactone.   Periodic BMET to follow renal fx, sodium levels.    *  Stop APAP?  And replace with xycodone or other non APAP containing pain reliever.  *  Standing weights so we get most accurate measures.    *  Periodic BMETs.and LFTs.     Jennye MoccasinSarah Maguire Killmer  11/25/2015, 10:39 AM Pager: [phone]

## 2015-11-25 NOTE — Patient Care Conference (Signed)
Inpatient RehabilitationTeam Conference and Plan of Care Update Date: 11/24/2015   Time: 2:50 PM    Patient Name: [name]      Medical Record Number: [id number]  Date of Birth: 11-13-1960 Sex: Male         Room/Bed: 4W01C/4W01C-01 Payor Info: Payor: [employer] / Plan: [employer] PPO / Product Type: *No Product type* /    Admitting Diagnosis: Debility  Admit Date/Time:  11/13/2015  5:50 PM Admission Comments: No comment available   Primary Diagnosis:  Debilitated Principal Problem: Debilitated  Patient Active Problem List   Diagnosis Date Noted  . Loose stools   . Ascites   . Debilitated 11/13/2015  . Encephalopathy, hepatic (HCC)   . Goals of care, counseling/discussion   . Counseling regarding goals of care   . Decompensated liver disease (HCC)   . Anasarca   . Bleeding   . Palliative care encounter   . Absolute anemia   . Bilateral lower extremity edema   . Acute respiratory failure with hypoxia (HCC)   . AKI (acute kidney injury) (HCC)   . Alcoholic cirrhosis of liver with ascites (HCC)   . Altered mental status   . Elevated INR   . Lactic acidosis   . Subacute liver failure without hepatic coma   . Protein-calorie malnutrition, severe (HCC) 11/02/2015  . Shock (HCC) 10/31/2015  . Anemia 10/31/2015  . Cirrhosis (HCC) 10/31/2015  . Acute respiratory failure (HCC) 10/31/2015  . Pressure ulcer 10/31/2015  . Thrombocytopenia (HCC) 11/28/2014  . Allergic rhinitis 11/14/2014  . Arthritis 11/14/2014  . Cataracts, bilateral 11/14/2014  . Sleep apnea 11/14/2014  . HTN (hypertension) 11/14/2014  . Neuropathy (HCC) 11/14/2014  . Achilles tendon tear 11/14/2014  . Pedal edema 11/14/2014  . Renal failure 11/12/2013  . Syncope 11/12/2013    Expected Discharge Date: Expected Discharge Date:  (12-01-15 vs. SNF)  Team Members Present: Physician leading conference: Dr. Faith Rogue Social Worker Present: Amada Jupiter, LCSW Nurse Present: Ronny Bacon, RN PT Present: Bayard Hugger, Nita Sickle, PT OT Present: Ardis Rowan, Daneil Dolin, OT SLP Present: Feliberto Gottron, SLP PPS Coordinator present : Tora Duck, RN, CRRN     Current Status/Progress Goal Weekly Team Focus  Medical   continued ascites which will likely require long term mgt. pain/sx wax and wane. still working through [employer]  see prior---still goal  diuresis/sx management. repeat paracentesis?   Bowel/Bladder   cont x2 LBM 11/23/15. Loose stools, C-Diff neg  Remain cont x2  Monitor Bladder and bowel function   Swallow/Nutrition/ Hydration             ADL's   BADLs-min A/supervision overall; functional transfers-supervision; decreased activity tolerance  supervision overall (downgraded)  activity tolerance, functional transfers, standing balance, continued discharge planning   Mobility   supervision overall  mod I/supervision overall  functional mobility trianing, standing balance, activty tolerance, edema management, pt education   Communication             Safety/Cognition/ Behavioral Observations            Pain   C/o pain legs and scrotum, Tylenol 650 mg prn  Pain  <3  Assess and treat pain during shift   Skin   Abd distended, BLE edema, drhy skin   Free of breakdown  Assess skin q shift    Rehab Goals Patient on target to meet rehab goals: Yes Rehab Goals Revised: none *See Care Plan and progress notes for long and short-term  goals.  Barriers to Discharge: see prior    Possible Resolutions to Barriers:  long term liver disease mgt---establish game plan    Discharge Planning/Teaching Needs:  Pt's family is working out how to provide 24/7 supervision [facility]. [facility] other [facility].  Family can come in for family education if plan changes back to home with 24/7 supervision.   Team Discussion:  Pt with chronic GI issues, likely needing another tap.  Pt is reaching supervision level goals with PT, but requires a lot of extra  time.  He needs min assist with lower body bathing and dressing due to edema.  Pt will need 24/7 supervision.  Revisions to Treatment Plan:  none   Continued Need for Acute Rehabilitation Level of Care: The patient requires daily medical management by a physician with specialized training in physical medicine and rehabilitation for the following conditions: Daily direction of a multidisciplinary physical rehabilitation program to ensure safe treatment while eliciting the highest outcome that is of practical value to the patient.: Yes Daily medical management of patient stability for increased activity during participation in an intensive rehabilitation regime.: Yes Daily analysis of laboratory values and/or radiology reports with any subsequent need for medication adjustment of medical intervention for : Neurological problems;Other  [name], [name] 11/25/2015, 9:15 AM

## 2015-11-25 NOTE — Progress Notes (Signed)
Physical Therapy Session Note  Patient Details  Name: [name] MRN: [id number] Date of Birth: 10/26/1960  Today's Date: 11/25/2015 PT Individual Time: [id number] PT Individual Time Calculation (min): 70 min   Short Term Goals: Week 2:  PT Short Term Goal 1 (Week 2): = LTGs due to anticipated LOS  Skilled Therapeutic Interventions/Progress Updates:   Pt received in w/c, very fatigue but willing to participate.  Assisted pt with donning shoes with total A due to significant edema.  Pt performed w/c mobility for UE strengthening and endurance training >150' with supervision.  Performed BERG balance re-assessment; pt with 5 point improvement but still at high risk for falls.  Pt required multiple prolonged seated rest breaks during BERG due to SOB; pt Sp02 remained at 100% throughout assessment but HR did increase from 102>>123.  Pt reports difficulty breathing due to fluid and pressure in abdomen.  Pt requesting to return to room to use toilet.  In room pt performed sit>stand and ambulated in/out of bathroom with RW and min A due to shuffling gait and flexed posture which caused pt to experience a forwards LOB intermittently.  Performed sit <> stand from toilet with min A and stood with UE support on RW while therapist assisted with hygiene.  Pt ambulated to bed with min A and performed sit > supine with supervision.  Pt left in bed with LE elevated and all items within reach.  Therapy Documentation Precautions:  Precautions Precautions: Fall Restrictions Weight Bearing Restrictions: No Vital Signs: Therapy Vitals Temp: 98.6 F (37 C) Temp Source: Oral Pulse Rate: (!) 115 Resp: 19 BP: 119/80 mmHg Patient Position (if appropriate): Sitting Oxygen Therapy SpO2: 100 % O2 Device: Not Delivered Pain: Pain Assessment Faces Pain Scale: Hurts little more Pain Type: Acute pain Pain Location: Abdomen Pain Orientation: Mid Pain Descriptors / Indicators: Pressure Pain Onset: On-going   Balance: Standardized Balance Assessment Standardized Balance Assessment: Berg Balance Test Berg Balance Test Sit to Stand: Needs minimal aid to stand or to stabilize Standing Unsupported: Able to stand 2 minutes with supervision Sitting with Back Unsupported but Feet Supported on Floor or Stool: Able to sit safely and securely 2 minutes Stand to Sit: Controls descent by using hands Transfers: Needs one person to assist Standing Unsupported with Eyes Closed: Able to stand 10 seconds with supervision Standing Ubsupported with Feet Together: Needs help to attain position but able to stand for 30 seconds with feet together From Standing, Reach Forward with Outstretched Arm: Can reach confidently >25 cm (10") From Standing Position, Pick up Object from Floor: Unable to pick up shoe, but reaches 2-5 cm (1-2") from shoe and balances independently From Standing Position, Turn to Look Behind Over each Shoulder: Looks behind one side only/other side shows less weight shift Turn 360 Degrees: Needs assistance while turning Standing Unsupported, Alternately Place Feet on Step/Stool: Able to complete >2 steps/needs minimal assist Standing Unsupported, One Foot in Front: Able to take small step independently and hold 30 seconds Standing on One Leg: Unable to try or needs assist to prevent fall Total Score: 28 Patient demonstrates increased fall risk as noted by score of 28/56 on Berg Balance Scale.  (<36= high risk for falls, close to 100%; 37-45 significant >80%; 46-51 moderate >50%; 52-55 lower >25%)   See Function Navigator for Current Functional Status.   Therapy/Group: Individual Therapy  Edman CircleHall, [name] 11/25/2015, 3:30 PM

## 2015-11-26 ENCOUNTER — Inpatient Hospital Stay (HOSPITAL_COMMUNITY): Payer: Federal, State, Local not specified - PPO

## 2015-11-26 ENCOUNTER — Inpatient Hospital Stay (HOSPITAL_COMMUNITY): Payer: Federal, State, Local not specified - PPO | Admitting: Occupational Therapy

## 2015-11-26 ENCOUNTER — Inpatient Hospital Stay (HOSPITAL_COMMUNITY): Payer: Federal, State, Local not specified - PPO | Admitting: Physical Therapy

## 2015-11-26 DIAGNOSIS — F4321 Adjustment disorder with depressed mood: Secondary | ICD-10-CM

## 2015-11-26 DIAGNOSIS — Z515 Encounter for palliative care: Secondary | ICD-10-CM

## 2015-11-26 DIAGNOSIS — Z66 Do not resuscitate: Secondary | ICD-10-CM

## 2015-11-26 LAB — GLUCOSE, CAPILLARY
GLUCOSE-CAPILLARY: 98 mg/dL (ref 65–99)
GLUCOSE-CAPILLARY: 110 mg/dL — AB (ref 65–99)
GLUCOSE-CAPILLARY: 122 mg/dL — AB (ref 65–99)
Glucose-Capillary: 94 mg/dL (ref 65–99)

## 2015-11-26 MED ORDER — LIDOCAINE HCL (PF) 1 % IJ SOLN
INTRAMUSCULAR | Status: AC
  Filled 2015-11-26: qty 10

## 2015-11-26 NOTE — Consult Note (Signed)
Consultation Note Date: 11/26/2015   Patient Name: [name]  DOB: 08/06/1960  MRN: [id number]  Age / Sex: 55 y.o., male  PCP: Esperanza RichtersEdward [name], PA-C Referring Physician: Ranelle OysterZachary T Swartz, MD  Reason for Consultation: Establishing goals of care and Psychosocial/spiritual support  HPI/Patient Profile: 55 y.o. male   admitted on 11/13/2015   Alcoholic with hx ETOH hepatitis and steatosis, hepatomegaly, small pelvic ascites (CT 2012) and mild pancreatitis (10/2013) AKI in 10/2013. Anemia (Hgb 10.1 in 12/2010) that PMD planned hematology referral in 11/2014. Thrombocytopenia of 125 in 12/2010. Untreated OSA. EF 50 to 55% by 10/2015 echo.   4/15 - 11/13/15 admission with resp failure, obtundation requiring intubation. + AKI, lactic acidosis, PNA, Afib vs SVT.  Ultrasound showed new dx of cirrhosis. Decompensted: MELD 33, Childs C. Platelets 23 K, coags 40/4.3. Hgb 2.7.  + protein cal malnutrition.  4/28 admitted to CIR for rehabiliation, has improved functionally, today for paracentesis for  6 liters of fluid  High risk for decompensation, plan dc for May 16.  Patient insists on going home with little social support is open to hospice services    Clinical Assessment and Goals of Care:  This NP Lorinda CreedMary Ingra Rother reviewed medical records, received report from team, assessed the patient and then meet at the patient's bedside along with his sister and sister in law  to discuss diagnosis, prognosis, GOC, EOL wishes disposition and options.   A discussion was had today regarding advanced directives, specific to rehospitalization, life prolonging medical interventions for his ESLD.   The difference between a aggressive medical intervention path  and a palliative comfort care path for this patient at this time was had.  Values and goals of care important to patient and family were attempted to be  elicited.  Concept of Hospice and Palliative Care were discussed   MOST form introduced  Natural trajectory and expectations at EOL were discussed.  Questions and concerns addressed.  Hard Choices booklet left for review. Family encouraged to call with questions or concerns.  PMT will continue to support holistically.   HPOA documented/ Bradley LobeLincoln Carrillo- brother    SUMMARY OF RECOMMENDATIONS    - continue with rehabilitation through discharge  -patient plans to retrun home on discharge    Code Status/Advance Care Planning:  DNR   Palliative Prophylaxis:    Aspiration, Bowel Regimen, Frequent Pain Assessment and Oral Care  Additional Recommendations (Limitations, Scope, Preferences):   Avoid Hospitalization  Psycho-social/Spiritual:   Very frank discussion with [name] regarding his plan to return home with little social support, high risk for decompensation and re-exposure to triggers rt his alcohol addiction.   He acknowledges the situation, the likely potential  problems with this plan but is willing to accept the consequences of this choice. Myself and family express concern for him and this plan.  Patient is adament about returning home.   Additional Recommendations: Education on Hospice  Prognosis:    < 6 months  Discharge Planning: Home with Hospice      Primary Diagnoses:  Present on Admission:  . Debilitated . Alcoholic cirrhosis of liver with ascites (HCC) . Bilateral lower extremity edema  I have reviewed the medical record, interviewed the patient and family, and examined the patient. The following aspects are pertinent.  Past Medical History  Diagnosis Date  . Hypertension   . Allergy   . Arthritis   . Cataract   . Heart murmur   . Sleep apnea     Problems with sleeping  . Neuromuscular disorder (HCC)     neuropathy rt lateral calf.  . Anemia 10/31/2015   Social History   Social History  . Marital Status: Single    Spouse Name: N/A  .  Number of Children: N/A  . Years of Education: N/A   Social History Main Topics  . Smoking status: Former Smoker    Types: Cigarettes  . Smokeless tobacco: Never Used  . Alcohol Use: 0.0 oz/week    0 Standard drinks or equivalent per week     Comment: . 4-5 drinks glasses of wine at night.(every night)  . Drug Use: No  . Sexual Activity: Yes   Other Topics Concern  . None   Social History Narrative   Family History  Problem Relation Age of Onset  . Atrial fibrillation Mother   . CVA Mother   . Hypertension Mother   . Emphysema Mother    Scheduled Meds: . albumin human  50 g Intravenous Once  . amiodarone  200 mg Oral Daily  . folic acid  1 mg Oral Daily  . furosemide  60 mg Oral Daily  . insulin aspart  0-9 Units Subcutaneous TID WC  . lactulose  30 g Oral Daily  . lidocaine (PF)      . loratadine  10 mg Oral Daily  . nystatin   Topical BID  . oxymetazoline  1 spray Each Nare BID  . pantoprazole  40 mg Oral Daily  . saccharomyces boulardii  250 mg Oral BID  . spironolactone  150 mg Oral Daily  . thiamine  100 mg Oral Daily   Continuous Infusions:  PRN Meds:.albuterol, lidocaine, ondansetron **OR** ondansetron (ZOFRAN) IV, traMADol Medications Prior to Admission:  Prior to Admission medications   Medication Sig Start Date End Date Taking? Authorizing Provider  amiodarone (PACERONE) 200 MG tablet Take 1 tablet (200 mg total) by mouth daily. 11/13/15   Penny Pia, MD  azithromycin (ZITHROMAX) 500 MG tablet Take 1 tablet (500 mg total) by mouth daily. 11/13/15   Penny Pia, MD  cefdinir (OMNICEF) 300 MG capsule Take 1 capsule (300 mg total) by mouth 2 (two) times daily. 11/13/15   Penny Pia, MD  furosemide (LASIX) 20 MG tablet Take 3 tablets (60 mg total) by mouth daily. 11/13/15   Penny Pia, MD  lactulose (CHRONULAC) 10 GM/15ML solution Take 45 mLs (30 g total) by mouth daily. 11/13/15   Penny Pia, MD  omeprazole (PRILOSEC) 20 MG capsule Take 20 mg by mouth  daily.    Historical Provider, MD  potassium chloride SA (K-DUR,KLOR-CON) 20 MEQ tablet Take 1 tablet (20 mEq total) by mouth once. 11/28/14   Ramon Dredge [name], PA-C  thiamine 100 MG tablet Take 1 tablet (100 mg total) by mouth daily. 11/13/15   Penny Pia, MD   Allergies  Allergen Reactions  . Lactose Intolerance (Gi)     unknown   Review of Systems  Constitutional: Positive for fatigue.    Physical Exam  Constitutional: He appears cachectic. He appears ill.  Cardiovascular:  Tachycardia present.   Pulmonary/Chest: He has decreased breath sounds in the right lower field and the left lower field.  Abdominal: He exhibits distension.  Skin: Skin is warm and dry.  - noted jaundice  Psychiatric: He has a normal mood and affect. His speech is normal and behavior is normal. Cognition and memory are normal. He expresses inappropriate judgment.    Vital Signs: BP 107/67 mmHg  Pulse 101  Temp(Src) 99 F (37.2 C) (Oral)  Resp 20  Ht 6' (1.829 m)  Wt 99.1 kg (218 lb 7.6 oz)  BMI 29.62 kg/m2  SpO2 100% Pain Assessment: No/denies pain   Pain Score: 3    SpO2: SpO2: 100 % O2 Device:SpO2: 100 % O2 Flow Rate: .   IO: Intake/output summary:  Intake/Output Summary (Last 24 hours) at 11/26/15 1342 Last data filed at 11/26/15 0700  Gross per 24 hour  Intake    480 ml  Output   1200 ml  Net   -720 ml    LBM: Last BM Date: 11/23/15 Baseline Weight: Weight: 107.502 kg (237 lb) Most recent weight: Weight: 99.1 kg (218 lb 7.6 oz)      Palliative Assessment/Data: 30 % at best    Discussed with Emogene Morgan with SW  Time In: 1500 Time Out: 1415 Time Total: 75 min Greater than 50%  of this time was spent counseling and coordinating care related to the above assessment and plan.  Signed by: Lorinda Creed, NP   Please contact Palliative Medicine Team phone at [phone] for questions and concerns.  For individual provider: See Loretha Stapler

## 2015-11-26 NOTE — Progress Notes (Signed)
Physical Therapy Note  Patient Details  Name: [name] MRN: [id number] Date of Birth: 05/16/1961 Today's Date: 11/26/2015    Pt missed 45 min of skilled PT due to procedure. Pt just returned from paracentesis and declined any therapy at this time. Pt eating lunch with visitors in the room. Will follow up as able.  Karolee StampsGray, Zeno Hickel Darrol PokeBrescia  Eura Mccauslin B. Mabrey Howland, PT, DPT  11/26/2015, 2:21 PM

## 2015-11-26 NOTE — Patient Care Conference (Addendum)
Inpatient RehabilitationTeam Conference and Plan of Care Update Date: 11/24/2015   Time: 2:45 PM    Patient Name: [name]      Medical Record Number: [id number]  Date of Birth: 04/19/1961 Sex: Male         Room/Bed: 4W01C/4W01C-01 Payor Info: Payor: BLUE CROSS BLUE SHIELD / Plan: BCBS/[employer] PPO / Product Type: *No Product type* /    Admitting Diagnosis: Debility  Admit Date/Time:  11/13/2015  5:50 PM Admission Comments: No comment available   Primary Diagnosis:  Debilitated Principal Problem: Debilitated  Patient Active Problem List   Diagnosis Date Noted  . DNR (do not resuscitate) 11/26/2015  . Adjustment disorder with depressed mood   . Portal hypertension (HCC)   . Loose stools   . Ascites   . Debilitated 11/13/2015  . Encephalopathy, hepatic (HCC)   . Goals of care, counseling/discussion   . Counseling regarding goals of care   . Decompensated liver disease (HCC)   . Anasarca   . Bleeding   . Palliative care encounter   . Absolute anemia   . Bilateral lower extremity edema   . Acute respiratory failure with hypoxia (HCC)   . AKI (acute kidney injury) (HCC)   . Alcoholic cirrhosis of liver with ascites (HCC)   . Altered mental status   . Elevated INR   . Lactic acidosis   . Subacute liver failure without hepatic coma   . Protein-calorie malnutrition, severe (HCC) 11/02/2015  . Shock (HCC) 10/31/2015  . Anemia 10/31/2015  . Cirrhosis (HCC) 10/31/2015  . Acute respiratory failure (HCC) 10/31/2015  . Pressure ulcer 10/31/2015  . Thrombocytopenia (HCC) 11/28/2014  . Allergic rhinitis 11/14/2014  . Arthritis 11/14/2014  . Cataracts, bilateral 11/14/2014  . Sleep apnea 11/14/2014  . HTN (hypertension) 11/14/2014  . Neuropathy (HCC) 11/14/2014  . Achilles tendon tear 11/14/2014  . Pedal edema 11/14/2014  . Renal failure 11/12/2013  . Syncope 11/12/2013    Expected Discharge Date: Expected Discharge Date:  (12-01-15 vs. SNF)  Team Members  Present: Physician leading conference: Dr. Faith RogueZachary Swartz Social Worker Present: Amada JupiterLucy Hoyle, LCSW Nurse Present: Ronny BaconWhitney Reardon, RN PT Present: Bayard Huggerebecca Varner, Nita SicklePT;Rodney Wishart, PT OT Present: Ardis Rowanom Lanier, Daneil DolinOTA;Katie Pittman, OT SLP Present: Feliberto Gottronourtney Payne, SLP PPS Coordinator present : Tora DuckMarie Noel, RN, CRRN     Current Status/Progress Goal Weekly Team Focus  Medical   continued ascites which will likely require long term mgt. pain/sx wax and wane. still working through [employer]  see prior---still goal  diuresis/sx management. repeat paracentesis?   Bowel/Bladder   cont x2 LBM 11/23/15. Loose stools, C-Diff neg  Remain cont x2  Monitor Bladder and bowel function   Swallow/Nutrition/ Hydration             ADL's   BADLs-min A/supervision overall; functional transfers-supervision; decreased activity tolerance  supervision overall (downgraded)  activity tolerance, functional transfers, standing balance, continued discharge planning   Mobility   supervision overall  mod I/supervision overall  functional mobility trianing, standing balance, activty tolerance, edema management, pt education   Communication             Safety/Cognition/ Behavioral Observations            Pain   C/o pain legs and scrotum, Tylenol 650 mg prn  Pain  <3  Assess and treat pain during shift   Skin   Abd distended, BLE edema, drhy skin   Free of breakdown  Assess skin q shift    Rehab Goals Patient  on target to meet rehab goals: Yes Rehab Goals Revised: none *See Care Plan and progress notes for long and short-term goals.  Barriers to Discharge: see prior    Possible Resolutions to Barriers:  long term liver disease mgt---establish game plan    Discharge Planning/Teaching Needs:  Pt's family cannot provide 24/7 supervision so they would like to pursue the [name] [facility] [place].  Pt is not interested in another facility, [facility] and SNF is not an option with pt having federal BCBS.  Pt  is adamant about returning to his home.  Family can come in for family education if plan changes back to home with 24/7 supervision, but at this time, it seems pt will go home with intermittent supervision from his partner.   Team Discussion:  Pt with chronic GI issues and needs another tap.  Pt is supervision with extra time for PT and min A for lower body bathing and dressing with OT.  Team is recommending 24/7 supervision.  [facility], as they cannot provide 24/7 supervision and they do not want him going home alone.  Revisions to Treatment Plan:  none   Continued Need for Acute Rehabilitation Level of Care: The patient requires daily medical management by a physician with specialized training in physical medicine and rehabilitation for the following conditions: Daily direction of a multidisciplinary physical rehabilitation program to ensure safe treatment while eliciting the highest outcome that is of practical value to the patient.: Yes Daily medical management of patient stability for increased activity during participation in an intensive rehabilitation regime.: Yes Daily analysis of laboratory values and/or radiology reports with any subsequent need for medication adjustment of medical intervention for : Neurological problems;Other  Audria Takeshita, Vista Deck 11/26/2015, 2:44 PM

## 2015-11-26 NOTE — Progress Notes (Signed)
Social Work Patient ID: [name], male   DOB: 1961-06-14, 55 y.o.   MRN: [id number]   CSW met with pt and his sister yesterday to f/u on team conference discussion.  Pt was adamant that he was going home at d/c and was not agreeable to [place] [facility] [place] transfer.  Pt does not have a SNF benefit through his federal BCBS plan, so [facility] only [facility].  Pt became irritated with CSW suggesting alternative option to going home and CSW decided to have him think about it overnight and we'd revisit in the morning.  In the meantime, CSW spoke with pt's brother, [name], and he is very concerned about pt returning home without 24/7 supervision.  CSW explained that we are too and that pt would do this against our recommendation, however, Dr. Naaman Plummer has determined that pt can make his own decisions, therefore, CSW cannot go against his wishes.  Brother understood that, but still felt that the medical team should talk to pt with all of the facts of his condition so that he understands what he is making a decision about.  CSW asked Dr. Naaman Plummer to talk with him on rounds today and he did, very directly.  When CSW visited with pt today, he continued to adamantly refuse any other d/c disposition besides home.  CSW spoke with BCBS CM who assists with transitions to update her on situation.  CSW then asked Dr. Naaman Plummer if we could re-consult palliative care and he agreed.  They are coming to meet with pt and family at [facility] today.  Pt's sister is hopeful that pt will at least agree to hospice coming in to help him.  CSW will remain available to assist as needed.

## 2015-11-26 NOTE — Procedures (Signed)
Ultrasound-guided therapeutic paracentesis performed yielding 6 liters of amber colored fluid. No immediate complications.  Staphanie Harbison E 1:43 PM 11/26/2015

## 2015-11-26 NOTE — Progress Notes (Signed)
Occupational Therapy Session Note  Patient Details  Name: [name] MRN: [id number] Date of Birth: 01/09/1961  Today's Date: 11/26/2015 OT Individual Time: 1100-1200 OT Individual Time Calculation (min): 60 min    Short Term Goals: Week 2:  OT Short Term Goal 1 (Week 2): continue with downgraded LTG  Skilled Therapeutic Interventions/Progress Updates:    Pt engaged in BADL retraining including bathing with sit<>stand from w/c at sink.  Pt continues to prefer hospital gown secondary to ascites and LB edema. Pt stood at sink X 5 for approx 3 mins each time.  Pt required assistance bathing lower legs without AE.  Pt requires more than a reasonable amount of time to complete tasks with multiple rest breaks.  Focus on activity tolerance, sit<>stand, standing balance, and safety awareness to increase independence with BADLs.   Therapy Documentation Precautions:  Precautions Precautions: Fall Restrictions Weight Bearing Restrictions: No  Pain:  Pt c/o tightness in abdomen; RN aware  See Function Navigator for Current Functional Status.   Therapy/Group: Individual Therapy  Rich BraveLanier, Erika Chappell 11/26/2015, 12:19 PM

## 2015-11-26 NOTE — Consult Note (Signed)
PSYCHODIAGNOSTIC EVALUATION - [facility]   Mr. [name] is a 55 year old man, who was seen for an initial psychodiagnostic evaluation to assess for potential depression, anxiety, or other mental illness in the setting of physical debility.  According to staff members, he has a history of alcohol abuse and had displayed symptoms of potential depressed mood.  Therefore, a neuropsychological consult was requested.    During the current session, Mr. [name] complained mostly of physical problems (e.g. limited movement in his right foot, urinary frequency, significant daytime fatigue, especially if he takes Claritin later in the morning, etc.).  He commented that owing mostly to urinary frequency, he has poor sleep at night and ends up getting most of his rest during the day.  Emotionally, he mentioned that he has felt "down," secondary to all of his physical changes.  He commented that he has felt frustrated by the process of recovery and general difficulty of physical therapy sessions and overall, he commented that his situation felt "overwhelming."  In the past, Mr. [name] stated that he dealt with stress by exercising, being active, and staying busy.  He has had some trouble coping in the hospital, because it is hard to use the mechanisms that have helped him cope previously.  However, he commented that he is trying to cope by "doing as I'm told."  Despite reported low mood at times, he denied suicidal ideation, as well as any significant worries or concerns.  He stated that he feels as though he has significant social support.  Mr. [name] admitted to his history of alcohol abuse, stating that for several years, he drank half of a bottle of Saki per night to cope with work stress, but he said he decided that he wanted to stop, so he quit; he now drinks coffee and juice and he noted that he wishes he had access to more juice while in the hospital.  He denied any  desire to consume alcohol now.  His responses to self-report measures of mood symptoms were not suggestive of the presence of clinically significant depression or anxiety at this time.    IMPRESSION:  Mr. [name] seems to be experiencing some mild depressed mood at the level of an adjustment disorder.  Time was spent helping him to set goals for his hospital stay in order to optimize motivation.  For example, he wants to get into the wheelchair independently, perform personal hygiene activities, and learn how to better use the wheelchair.  He also noted that to help him deal with frustration, he will talk to his brother.  His physician may consider whether there are alternative medications that will not cause him urinary frequency at night and nursing staff may see whether they can schedule him to receive Claritin first thing each morning.  A follow-up session with the neuropsychologist could be requested should his treatment team feel that it would be beneficial in informing care.    DIAGNOSIS:   Adjustment disorder with depressed mood  Leavy CellaKaren Renate Danh, Psy.D.  Clinical Neuropsychologist

## 2015-11-26 NOTE — Progress Notes (Signed)
Physical Therapy Session Note  Patient Details  Name: [name] MRN: [id number] Date of Birth: 07/26/1960  Today's Date: 11/26/2015 PT Individual Time: 0900-1000 PT Individual Time Calculation (min): 60 min   Short Term Goals: Week 2:  PT Short Term Goal 1 (Week 2): = LTGs due to anticipated LOS  Skilled Therapeutic Interventions/Progress Updates:   Patient in bed upon arrival, transferred to edge of bed with increased time and performed sit > stand after 2 attempts with supervision using RW. RN requesting standing weight, patient stepped on/off scale with steady assist. Patient ambulated to/from bathroom for bowel movement using RW with supervision and stood at sink for hand hygiene and while RN measured abdominal girth. Patient wearing gown but performed hygiene in standing with setup assist. Patient propelled wheelchair to gym using BUE with cues for efficient technique and slow pace. Performed stand pivot transfers wheelchair <> mat table with supervision using RW. Patient engaged in standing game of Zoom ball to challenge standing tolerance, balance, and BUE strength for 2 trials of 3-4 minutes each with seated rest between due to fatigue. Patient ambulated approx 150 ft using RW with supervision back towards room. Patient left sitting in wheelchair with all needs in reach.    Therapy Documentation Precautions:  Precautions Precautions: Fall Restrictions Weight Bearing Restrictions: No Pain:  8/10 RLE pain, RN made aware and administered pain medication   See Function Navigator for Current Functional Status.   Therapy/Group: Individual Therapy  [name], Kaycen Whitworth A 11/26/2015, 10:04 AM

## 2015-11-26 NOTE — Progress Notes (Signed)
Subjective/Complaints: Still with distention. Trying to eat what he can. Able to sleep. No further drainage around foley after adjustment by RN.   ROS: Denies CP, SOB, nausea, vomiting, diarrhea.  Objective: Vital Signs: Blood pressure 107/67, pulse 101, temperature 99 F (37.2 C), temperature source Oral, resp. rate 20, height 6' (1.829 m), weight 98.8 kg (217 lb 13 oz), SpO2 100 %. No results found. Results for orders placed or performed during the hospital encounter of 11/13/15 (from the past 72 hour(s))  Glucose, capillary     Status: None   Collection Time: 11/23/15 12:13 PM  Result Value Ref Range   Glucose-Capillary 99 65 - 99 mg/dL  Glucose, capillary     Status: Abnormal   Collection Time: 11/23/15  4:54 PM  Result Value Ref Range   Glucose-Capillary 108 (H) 65 - 99 mg/dL  Glucose, capillary     Status: Abnormal   Collection Time: 11/23/15  9:26 PM  Result Value Ref Range   Glucose-Capillary 107 (H) 65 - 99 mg/dL  Glucose, capillary     Status: Abnormal   Collection Time: 11/24/15  6:47 AM  Result Value Ref Range   Glucose-Capillary 108 (H) 65 - 99 mg/dL  Glucose, capillary     Status: Abnormal   Collection Time: 11/24/15 11:31 AM  Result Value Ref Range   Glucose-Capillary 128 (H) 65 - 99 mg/dL   Comment 1 Notify RN   Glucose, capillary     Status: None   Collection Time: 11/24/15  4:32 PM  Result Value Ref Range   Glucose-Capillary 95 65 - 99 mg/dL   Comment 1 Notify RN   Glucose, capillary     Status: Abnormal   Collection Time: 11/24/15  9:08 PM  Result Value Ref Range   Glucose-Capillary 119 (H) 65 - 99 mg/dL  Glucose, capillary     Status: None   Collection Time: 11/25/15  6:40 AM  Result Value Ref Range   Glucose-Capillary 89 65 - 99 mg/dL  Ammonia     Status: Abnormal   Collection Time: 11/25/15  6:41 AM  Result Value Ref Range   Ammonia 70 (H) 9 - 35 umol/L  Comprehensive metabolic panel     Status: Abnormal   Collection Time: 11/25/15  6:41 AM   Result Value Ref Range   Sodium 132 (L) 135 - 145 mmol/L   Potassium 4.0 3.5 - 5.1 mmol/L   Chloride 102 101 - 111 mmol/L   CO2 18 (L) 22 - 32 mmol/L   Glucose, Bld 98 65 - 99 mg/dL   BUN 25 (H) 6 - 20 mg/dL   Creatinine, Ser 1.62 (H) 0.61 - 1.24 mg/dL   Calcium 8.0 (L) 8.9 - 10.3 mg/dL   Total Protein 7.0 6.5 - 8.1 g/dL   Albumin 2.0 (L) 3.5 - 5.0 g/dL   AST 61 (H) 15 - 41 U/L   ALT 34 17 - 63 U/L   Alkaline Phosphatase 55 38 - 126 U/L   Total Bilirubin 14.4 (H) 0.3 - 1.2 mg/dL   GFR calc non Af Amer 47 (L) >60 mL/min   GFR calc Af Amer 54 (L) >60 mL/min    Comment: (NOTE) The eGFR has been calculated using the CKD EPI equation. This calculation has not been validated in all clinical situations. eGFR's persistently <60 mL/min signify possible Chronic Kidney Disease.    Anion gap 12 5 - 15  Glucose, capillary     Status: None   Collection Time: 11/25/15 12:06  PM  Result Value Ref Range   Glucose-Capillary 79 65 - 99 mg/dL   Comment 1 Notify RN   Glucose, capillary     Status: Abnormal   Collection Time: 11/25/15  5:16 PM  Result Value Ref Range   Glucose-Capillary 113 (H) 65 - 99 mg/dL  Glucose, capillary     Status: None   Collection Time: 11/25/15  9:18 PM  Result Value Ref Range   Glucose-Capillary 99 65 - 99 mg/dL   Comment 1 Notify RN   Glucose, capillary     Status: None   Collection Time: 11/26/15  6:31 AM  Result Value Ref Range   Glucose-Capillary 98 65 - 99 mg/dL   Comment 1 Notify RN      HEENT: Normocephalic. icteric Cardio: Regular rate. Regular rhythm. no murmurs Resp: CTA B/L and unlabored GI: Distention and ascites, appears c/w yesterday  Musculoskeletal:  Edema in both thighs and the feet. No tenderness Skin:   Other jaundice. Warm and dry. Neuro: Alert/Oriented Motor: 4+/5 bilateral deltoid, biceps, triceps, grip, 2/5 hip flexor knee extensor 4-/5 ankle dorsiflexor and plantar flexor Gen. no acute distress.  GU: scrotal edema/penile edema  unchanged. Dried urine on bedsheet  Assessment/Plan: 1. Functional deficits secondary to Debilitation secondary to respiratory failure/sepsis, complications from cirrhosis of liver/ascites. which require 3+ hours per day of interdisciplinary therapy in a comprehensive inpatient rehab setting. Physiatrist is providing close team supervision and 24 hour management of active medical problems listed below. Physiatrist and rehab team continue to assess barriers to discharge/monitor patient progress toward functional and medical goals. FIM: Function - Bathing Bathing activity did not occur: Refused Position: Wheelchair/chair at sink Body parts bathed by patient: Right arm, Left arm, Chest, Abdomen, Right upper leg, Left upper leg, Front perineal area Body parts bathed by helper: Right lower leg, Left lower leg, Back, Buttocks Assist Level: Touching or steadying assistance(Pt > 75%)  Function- Upper Body Dressing/Undressing What is the patient wearing?: Pull over shirt/dress, Hospital gown Pull over shirt/dress - Perfomed by patient: Thread/unthread right sleeve, Thread/unthread left sleeve, Put head through opening, Pull shirt over trunk Pull over shirt/dress - Perfomed by helper: Pull shirt over trunk Assist Level: Set up Function - Lower Body Dressing/Undressing What is the patient wearing?: Non-skid slipper socks Position: Wheelchair/chair at sink Underwear - Performed by helper: Thread/unthread right underwear leg, Thread/unthread left underwear leg, Pull underwear up/down Pants- Performed by patient: Pull pants up/down, Thread/unthread left pants leg, Thread/unthread right pants leg Pants- Performed by helper: Pull pants up/down, Thread/unthread left pants leg, Thread/unthread right pants leg Non-skid slipper socks- Performed by helper: Don/doff right sock, Don/doff left sock Shoes - Performed by helper: Don/doff right shoe, Don/doff left shoe, Fasten right, Fasten left Assist for footwear:  Dependant Assist for lower body dressing: Touching or steadying assistance (Pt > 75%)  Function - Toileting Toileting activity did not occur: N/A Toileting steps completed by patient: Adjust clothing prior to toileting, Adjust clothing after toileting Toileting steps completed by helper: Performs perineal hygiene Toileting Assistive Devices: Grab bar or rail Assist level: Touching or steadying assistance (Pt.75%)  Function - Air cabin crew transfer activity did not occur: N/A Toilet transfer assistive device: Elevated toilet seat/BSC over toilet Assist level to toilet: Touching or steadying assistance (Pt > 75%) Assist level from toilet: Touching or steadying assistance (Pt > 75%)  Function - Chair/bed transfer Chair/bed transfer activity did not occur: N/A Chair/bed transfer method: Ambulatory, Stand pivot Chair/bed transfer assist level: Touching or steadying assistance (  Pt > 75%) Chair/bed transfer assistive device: Armrests, Walker Chair/bed transfer details: Verbal cues for technique, Verbal cues for precautions/safety, Verbal cues for safe use of DME/AE  Function - Locomotion: Wheelchair Will patient use wheelchair at discharge?: Yes Type: Manual Max wheelchair distance: 150 Assist Level: Supervision or verbal cues Assist Level: Supervision or verbal cues Wheel 150 feet activity did not occur: Safety/medical concerns Assist Level: Supervision or verbal cues Turns around,maneuvers to table,bed, and toilet,negotiates 3% grade,maneuvers on rugs and over doorsills: No Function - Locomotion: Ambulation Assistive device: Walker-rolling Max distance: 230 ft Assist level: Supervision or verbal cues Assist level: Supervision or verbal cues Assist level: Supervision or verbal cues Walk 150 feet activity did not occur: Safety/medical concerns Assist level: Supervision or verbal cues Assist level: Moderate assist (Pt 50 - 74%)  Function - Comprehension Comprehension:  Auditory Comprehension assist level: Understands complex 90% of the time/cues 10% of the time  Function - Expression Expression: Verbal Expression assist level: Expresses basic needs/ideas: With no assist  Function - Social Interaction Social Interaction assist level: Interacts appropriately with others with medication or extra time (anti-anxiety, antidepressant).  Function - Problem Solving Problem solving assist level: Solves basic 75 - 89% of the time/requires cueing 10 - 24% of the time  Function - Memory Memory assist level: Recognizes or recalls 50 - 74% of the time/requires cueing 25 - 49% of the time Patient normally able to recall (first 3 days only): Current season, Location of own room, Staff names and faces, That he or she is in a hospital    Medical Problem List and Plan: 1.  Debilitation secondary to respiratory failure/sepsis, complications from cirrhosis of liver/ascites. Status post paracentesis 11/02/2015             -continue CIR---24 hr care/SNF recommended  -had a long discussion with him regarding the plan moving forward. i expressed to him that it would not be safe for him to go home with out 24 hour supervision on multiple levels. The patient was attentive and listened but I'm not sure there was buy in. He is competent to make decisions. 2.  DVT Prophylaxis/Anticoagulation: SCDs. Monitor for any signs of DVT 3. Pain Management: Tylenol as needed 4. Anemia/thrombocytopenia question DIC related to cirrhosis. Status post transfused multiple units. CBC Latest Ref Rng 11/16/2015 11/12/2015 11/11/2015  WBC 4.0 - 10.5 K/uL 11.5(H) 13.3(H) 13.5(H)  Hemoglobin 13.0 - 17.0 g/dL 9.1(L) 8.6(L) 8.6(L)  Hematocrit 39.0 - 52.0 % 27.6(L) 26.3(L) 25.4(L)  Platelets 150 - 400 K/uL 173 149(L) 141(L)    5. Neuropsych: This patient is capable of making decisions on his own behalf. 6. Skin/Wound Care: Routine skin checks 7. Fluids/Electrolytes/Nutrition: encourage po as possible.  Appetite better with rx of ascites 8. Hypertension. No current antihypertensive medication. Follow with increased mobility 9. Acute renal insufficiency. BUN/Cr holding despite diuretics---reviewed labs today  BMP Latest Ref Rng 11/25/2015 11/22/2015 11/21/2015  Glucose 65 - 99 mg/dL 98 109(H) 96  BUN 6 - 20 mg/dL 25(H) 25(H) 29(H)  Creatinine 0.61 - 1.24 mg/dL 1.62(H) 1.67(H) 1.60(H)  Sodium 135 - 145 mmol/L 132(L) 130(L) 131(L)  Potassium 3.5 - 5.1 mmol/L 4.0 4.8 5.4(H)  Chloride 101 - 111 mmol/L 102 101 101  CO2 22 - 32 mmol/L 18(L) 18(L) 19(L)  Calcium 8.9 - 10.3 mg/dL 8.0(L) 8.2(L) 8.2(L)    10. Sinus tachycardia with SVT versus atrial fibrillation. Amiodarone 200 mg daily.  11. Liver failure with ascites:  S/p numerous paracenteses  -need GI assistance with  long range plan for this patient  -aldactone/lasix   -?TIPS    -LFT's remain fairly stable, recent ammonia level ok-  -repeat paracentesis needed---u/s guided today  12. Penile/scrotal edema----local care/lidocaine gel for comfort, nystatin for skin  -  rx of #11  -adjusted/changedfoley to decrease leakage 13. Diarrhea: soft to loose now  -continue probiotic  -?sl decr lactulose---stools remain loose/near baseline  LOS (Days) 13 A FACE TO FACE EVALUATION WAS PERFORMED  SWARTZ,ZACHARY T 11/26/2015, 8:05 AM

## 2015-11-26 NOTE — Progress Notes (Signed)
Occupational Therapy Note  Patient Details  Name: Teola Bradleyhomas Snipe MRN: [id number] Date of Birth: 04/04/1961  Today's Date: 11/26/2015 OT Missed Time: 30 Minutes Missed Time Reason: Unavailable (comment) (Pt unavailable due to procedure)  Pt missed 30 min of skilled OT due to procedure. Pt just returned from paracentesis. Will attempt make up time as able.    Lewis, Hattie Pine C 11/26/2015, 3:00 PM

## 2015-11-26 NOTE — Progress Notes (Addendum)
Abd.girth 133cm.this AM.

## 2015-11-27 ENCOUNTER — Inpatient Hospital Stay (HOSPITAL_COMMUNITY): Payer: Federal, State, Local not specified - PPO

## 2015-11-27 ENCOUNTER — Inpatient Hospital Stay (HOSPITAL_COMMUNITY): Payer: Federal, State, Local not specified - PPO | Admitting: Occupational Therapy

## 2015-11-27 ENCOUNTER — Inpatient Hospital Stay (HOSPITAL_COMMUNITY): Payer: Federal, State, Local not specified - PPO | Admitting: Physical Therapy

## 2015-11-27 LAB — GLUCOSE, CAPILLARY
GLUCOSE-CAPILLARY: 102 mg/dL — AB (ref 65–99)
GLUCOSE-CAPILLARY: 128 mg/dL — AB (ref 65–99)
GLUCOSE-CAPILLARY: 121 mg/dL — AB (ref 65–99)
GLUCOSE-CAPILLARY: 124 mg/dL — AB (ref 65–99)

## 2015-11-27 LAB — COMPREHENSIVE METABOLIC PANEL
ALBUMIN: 2.2 g/dL — AB (ref 3.5–5.0)
ALK PHOS: 53 U/L (ref 38–126)
ALT: 31 U/L (ref 17–63)
AST: 56 U/L — ABNORMAL HIGH (ref 15–41)
Anion gap: 13 (ref 5–15)
BILIRUBIN TOTAL: 15.2 mg/dL — AB (ref 0.3–1.2)
BUN: 25 mg/dL — AB (ref 6–20)
CALCIUM: 8.2 mg/dL — AB (ref 8.9–10.3)
CO2: 17 mmol/L — ABNORMAL LOW (ref 22–32)
CREATININE: 1.82 mg/dL — AB (ref 0.61–1.24)
Chloride: 102 mmol/L (ref 101–111)
GFR calc non Af Amer: 41 mL/min — ABNORMAL LOW (ref >60)
GFR, EST AFRICAN AMERICAN: 47 mL/min — AB (ref >60)
Glucose, Bld: 103 mg/dL — ABNORMAL HIGH (ref 65–99)
Potassium: 4 mmol/L (ref 3.5–5.1)
Sodium: 132 mmol/L — ABNORMAL LOW (ref 135–145)
TOTAL PROTEIN: 6.6 g/dL (ref 6.5–8.1)

## 2015-11-27 LAB — PROTIME-INR
INR: 2.9 — AB (ref 0.00–1.49)
PROTHROMBIN TIME: 29.8 s — AB (ref 11.6–15.2)

## 2015-11-27 MED ORDER — SPIRONOLACTONE 50 MG PO TABS
150.0000 mg | ORAL_TABLET | Freq: Every day | ORAL | Status: DC
  Administered 2015-11-27 – 2015-11-29 (×3): 150 mg via ORAL
  Filled 2015-11-27: qty 3
  Filled 2015-11-27: qty 6
  Filled 2015-11-27 (×2): qty 3
  Filled 2015-11-27: qty 6
  Filled 2015-11-27: qty 3

## 2015-11-27 MED ORDER — OXYCODONE HCL 5 MG PO TABS
5.0000 mg | ORAL_TABLET | Freq: Four times a day (QID) | ORAL | Status: DC | PRN
  Administered 2015-11-27 – 2015-11-28 (×3): 5 mg via ORAL
  Filled 2015-11-27 (×3): qty 1

## 2015-11-27 NOTE — Progress Notes (Signed)
Physical Therapy Session Note  Patient Details  Name: [name] MRN: [id number] Date of Birth: 05/22/1961  Today's Date: 11/27/2015 PT Individual Time: [id number] PT Individual Time Calculation (min): 45 min   Short Term Goals: Week 2:  PT Short Term Goal 1 (Week 2): = LTGs due to anticipated LOS  Skilled Therapeutic Interventions/Progress Updates:    Pt seen for 45 min makeup session. Pt received seated in w/c, c/o 9/10 abdominal pain and recently took medication; agreeable to treatment. W/c propulsion 1x200' with BUE and increased time; cues for hand placement on RW for increased power production and efficiency, and maintenance of shoulder integrity. Gait x5' with RW and transfer onto bed at 29" height with S. Sit <>supine with modI and increased time. Gait [phone] with R and S with slow speed and cues for upright posture. Remained seated in w/c at completion of session, all needs within reach.   Therapy Documentation Precautions:  Precautions Precautions: Fall Restrictions Weight Bearing Restrictions: No Pain: Pain Assessment Pain Assessment: 0-10 Pain Score: 9  Pain Type: Acute pain Pain Location: Abdomen Pain Orientation: Left Pain Descriptors / Indicators: Sore Pain Frequency: Constant Pain Onset: On-going Patients Stated Pain Goal: 2 Pain Intervention(s): Medication (See eMAR)   See Function Navigator for Current Functional Status.   Therapy/Group: Individual Therapy  Vista Lawmanlizabeth J Tygielski 11/27/2015, 10:31 AM

## 2015-11-27 NOTE — Progress Notes (Signed)
Physical Therapy Session Note  Patient Details  Name: [name] MRN: [id number] Date of Birth: 02/09/1961  Today's Date: 11/27/2015 PT Individual Time: 1500-1530 PT Individual Time Calculation (min): 30 min   Short Term Goals: Week 3:  PT Short Term Goal 1 (Week 3): = LTGs due to anticipated LOS  Skilled Therapeutic Interventions/Progress Updates:    Pt reports being very fatigued but wanting to work on UE therex prior to transfer back to bed. Pt instructed in orange theraband exercises for functional UE strengthening including horizational abduction, PNF diagonals, elbow extension and bicep curls x 10 reps each x 2 sets each with rest breaks as needed due to fatigue. Transfer back to bed with RW with close supervision and returned to supine. Positioned for comfort and all needs in reach.   Therapy Documentation Precautions:  Precautions Precautions: Fall Restrictions Weight Bearing Restrictions: No  Pain: RLE discomfort. C/o fatigue.  See Function Navigator for Current Functional Status.   Therapy/Group: Individual Therapy  Karolee StampsGray, [name]  [name] B. [name], PT, DPT  11/27/2015, 3:44 PM

## 2015-11-27 NOTE — Progress Notes (Signed)
Daily Rounding Note  11/27/2015, 8:26 AM  LOS: 14 days   SUBJECTIVE:       1.25 liters urine output 5/11.  6 liter tap 5/11.  Anywhere from 2 to 5 BMs daily.   Note palliative care revisit 5/11.   No nausea.  Eating 50 to 100% of meals.   OBJECTIVE:         Vital signs in last 24 hours:    Temp:  [98.6 F (37 C)-98.9 F (37.2 C)] 98.9 F (37.2 C) (05/12 0551) Pulse Rate:  [92-96] 94 (05/12 0551) Resp:  [18-22] 18 (05/12 0551) BP: (109-123)/(65-76) 122/65 mmHg (05/12 0551) SpO2:  [99 %-100 %] 99 % (05/12 0551) Weight:  [99.1 kg (218 lb 7.6 oz)-99.9 kg (220 lb 3.8 oz)] 99.9 kg (220 lb 3.8 oz) (05/12 0551) Last BM Date: 11/26/15 Filed Weights   11/26/15 0508 11/26/15 0900 11/27/15 0551  Weight: 98.8 kg (217 lb 13 oz) 99.1 kg (218 lb 7.6 oz) 99.9 kg (220 lb 3.8 oz)   General: jaundiced, alert, comfortable.     Heart: RRR Chest: clear bil.  No labored breathing Abdomen: protuberant and still tense but looks smaller.  NT  Extremities: + large LE edema Neuro/Psych:  Oriented x 3.  Some tremor but no asterixis in hands.  Calm, cooperative.   Intake/Output from previous day: 05/11 0701 - 05/12 0700 In: 120 [P.O.:120] Out: 1250 [Urine:1250]  Intake/Output this shift:    Lab Results: No results for input(s): WBC, HGB, HCT, PLT in the last 72 hours. BMET  Recent Labs  11/25/15 0641 11/27/15 0559  NA 132* 132*  K 4.0 4.0  CL 102 102  CO2 18* 17*  GLUCOSE 98 103*  BUN 25* 25*  CREATININE 1.62* 1.82*  CALCIUM 8.0* 8.2*   LFT  Recent Labs  11/25/15 0641 11/27/15 0559  PROT 7.0 6.6  ALBUMIN 2.0* 2.2*  AST 61* 56*  ALT 34 31  ALKPHOS 55 53  BILITOT 14.4* 15.2*   PT/INR  Recent Labs  11/27/15 0559  LABPROT 29.8*  INR 2.90*   Hepatitis Panel No results for input(s): HEPBSAG, HCVAB, HEPAIGM, HEPBIGM in the last 72 hours.  Studies/Results: US Paracentesis  11/26/2015  INDICATION: Alcoholic  cirrhosis with recurrent ascites. Request has been made for therapeutic paracentesis. EXAM: ULTRASOUND GUIDED THERAPEUTIC PARACENTESIS MEDICATIONS: 1% LIDOCAINE COMPLICATIONS: None immediate. PROCEDURE: Informed written consent was obtained from the patient after a discussion of the risks, benefits and alternatives to treatment. A timeout was performed prior to the initiation of the procedure. Initial ultrasound scanning demonstrates a large amount of ascites within the r left lower abdominal quadrant. The left lower abdomen was prepped and draped in the usual sterile fashion. 1% lidocaine was used for local anesthesia. Following this, a 19 gauge, 7-cm, Yueh catheter was introduced. An ultrasound image was saved for documentation purposes. The paracentesis was performed. The catheter was removed and a dressing was applied. The patient tolerated the procedure well without immediate post procedural complication. FINDINGS: A total of approximately 6 L of amber fluid was removed. IMPRESSION: Successful ultrasound-guided paracentesis yielding 6 liters of peritoneal fluid. Read by: Barnetta Chapel, PA-C Electronically Signed   By: Simonne Come M.D.   On: 11/26/2015 13:47    Scheduled Meds: . amiodarone  200 mg Oral Daily  . folic acid  1 mg Oral Daily  . furosemide  60 mg Oral Daily  . insulin aspart  0-9 Units Subcutaneous TID  WC  . lactulose  30 g Oral Daily  . loratadine  10 mg Oral Daily  . nystatin   Topical BID  . oxymetazoline  1 spray Each Nare BID  . pantoprazole  40 mg Oral Daily  . saccharomyces boulardii  250 mg Oral BID  . spironolactone  150 mg Oral Daily  . thiamine  100 mg Oral Daily   Continuous Infusions:  PRN Meds:.albuterol, lidocaine, ondansetron **OR** ondansetron (ZOFRAN) IV, oxyCODONE   ASSESMENT:   * Decompensated cirrhosis.  Ascites, s/p paracentesis x 5, latest (6 liter tap) on 5/11. No SBP on 4/19 or 4/24 taps.  Aldactone 150, Lasix 60 since 5/2. Wt down overall but  went from 218 to 220 # in previous 24 hours despite paracentesis.  Remains jaundiced, t bili overall tripled since admission.  Ongoing abd pain.  Nausea resolved.   * Coagulopathy, slowly worsening. No overt bleeding.   * AKI. Oliguria improved. renal function today, slightly worse post tap and Albumin infusion of 5/11.     * Hyponatremia, stable.   * Severe anemia, multifactorial, long-standing. Though FOBT + on 4/15, no overt CG, blood per NGT and no melena or BPR. No evidence varices on CT scan. EGD deferred by Dr Leone PayorGessner.   * Protein cal malnutrition. Appetite improved.   * Encephalopathy, hepatic and metabolic. Max ammonia of just 57. Much improved MS c/w admission. Having 2 to 3 stools per day on Lactulose 30 gm daily.    PLAN   *  Overall prognosis is poor.  Pt established as DNR.  Maintain current dose of diuretics.   Follow BMET,  Pt adamant for return to home, living alone.  Spoke with pt's brother Francesco SorLincoln, who is well aware of dim prognosis and that pt is likely to end up back inpt Francesco Sor(Lincoln gives him a week before readmission) *  Dr Elnoria HowardHung on call this weekend.     Jennye MoccasinSarah Jaileen Janelle  11/27/2015, 8:26 AM Pager: [phone]

## 2015-11-27 NOTE — Plan of Care (Signed)
Problem: RH Ambulation Goal: LTG Patient will ambulate in community environment (PT) LTG: Patient will ambulate in community environment, # of feet with assistance (PT).  Outcome: Not Applicable Date Met:  35/67/01 Goal DC'd as patient will be household ambulator at discharge.

## 2015-11-27 NOTE — Progress Notes (Signed)
Physical Therapy Weekly Progress Note  Patient Details  Name: [name] MRN: [id number] Date of Birth: 12/11/1960  Beginning of progress report period: Nov 19, 2015 End of progress report period: Nov 27, 2015  Today's Date: 11/27/2015 PT Individual Time: 0800-0900 PT Individual Time Calculation (min): 60 min   Patient has met  7 of 11 long term goals. Patient has made steady progress toward goals and requires supervision overall using RW but remains limited by decreased endurance, RLE pain, and abdominal discomfort. Discharge planning ongoing. Continue to recommend 24/7 supervision at discharge.   Patient continues to demonstrate the following deficits: abdominal and BLE edema, RLE pain, muscle weakness, muscle joint tightness, decreased cardiorespiratory endurance, decreased coordination, decreased memory, decreased standing balance, and decreased balance strategies  and therefore will continue to benefit from skilled PT intervention to enhance overall performance with activity tolerance, balance, postural control, ability to compensate for deficits and coordination.  See Patient's Care Plan for progression toward long term goals.  Patient progressing toward long term goals.  Continue plan of care.  Skilled Therapeutic Interventions/Progress Updates:   Patient in wheelchair with brother present in room. Continued discharge planning with patient and brother with focus on home setup, home entry, home measurements, and DME needs. Patient propelled wheelchair using BUE x 200 ft with more than reasonable time and no change in technique despite verbal cues for increased efficiency. Patient ambulated in carpeted ADL apartment using RW and performed bed mobility on regular bed with supervision. Patient performed furniture transfers from low compliant couch surface with uncontrolled descent stand > sit and mod A for sit > stand and verbal cues for anterior weight shift. Gait training using RW X 100 ft  with supervision. Patient performed simulated car transfer to sedan height using RW with supervision and increased time. Patient ambulated up/down ramp using RW + 50 ft with supervision. Patient required rest breaks throughout session due to fatigue and SOB. Patient left sitting in wheelchair with all needs in reach.   Therapy Documentation Precautions:  Precautions Precautions: Fall Restrictions Weight Bearing Restrictions: No Pain: Pain Assessment Pain Assessment: 0-10 Pain Score: 9  Pain Type: Acute pain Pain Location: Abdomen Pain Orientation: Left Pain Descriptors / Indicators: Sore Pain Frequency: Constant Pain Onset: On-going Patients Stated Pain Goal: 2 Pain Intervention(s): Medication (See eMAR)   See Function Navigator for Current Functional Status.  Therapy/Group: Individual Therapy  Laretta Alstrom 11/27/2015, 8:54 AM

## 2015-11-27 NOTE — Progress Notes (Signed)
Occupational Therapy Weekly Progress Note  Patient Details  Name: [name] MRN: [id number] Date of Birth: 02-02-1961  Beginning of progress report period: Nov 20, 2015 End of progress report period: Nov 27, 2015  Patient has met  6 of 8 long term goals.  Short term goals not set due to estimated length of stay.  Pt is making slow but steady progress with LTGs. Pt currently fatigues quickly and requires multiple rest breaks during BADLs.  Pt currently bathes at sink and has stated preference to not shower since he won't do this at home.  Pt currently requires min A for bathing tasks and mod A for LB dressing tasks.  Pt has been consulted by [facility] and is receptive to receiving services at home after discharge.  Pt will not be performing cooking or laundry tasks.   Patient continues to demonstrate the following deficits: severe weakness and limited activity tolerance, significant edema in abdomen and legs limiting pt's ability to reach his feet and therefore will continue to benefit from skilled OT intervention to enhance overall performance with BADL.  See Patient's Care Plan for progression toward long term goals.  Patient progressing toward long term goals..  Continue plan of care.  LTGs were downgraded and revised on last progress note on 11/20/15.        Therapy Documentation Precautions:  Precautions Precautions: Fall Restrictions Weight Bearing Restrictions: No  ADL: See Function Navigator for Current Functional Status.    [name], [facility] 11/27/2015, 3:06 PM

## 2015-11-27 NOTE — Progress Notes (Signed)
Occupational Therapy Session Note  Patient Details  Name: [name] MRN: [id number] Date of Birth: 02/07/1961  Today's Date: 11/27/2015 OT Individual Time: 1100-1200 OT Individual Time Calculation (min): 60 min    Short Term Goals: Week 1:  OT Short Term Goal 1 (Week 1): STG= LTG OT Short Term Goal 1 - Progress (Week 1): Other (comment) (OTR downgraded LTGs to supervisoin)  Skilled Therapeutic Interventions/Progress Updates:    Pt resting in w/c upon arrival.  Pt stated he did not get much sleep the previous night.  Pt declined bathing this morning but was agreeable to participating in therapy.  Pt engaged in w/c mobility tasks and therex on Sci[employer] at random level (work load 3; 10 mins X 2). Pt unable to increase RPMs to 30 today and required extended rest break between sets.  Pt requires more than a reasonable amount of time for w/c mobility in cluttered environment. Focus on activity tolerance, w/c mobility, and safety awareness.  Therapy Documentation Precautions:  Precautions Precautions: Fall Restrictions Weight Bearing Restrictions: No   Pain:  Pt c/o abdominal discomfort; RN aware  See Function Navigator for Current Functional Status.   Therapy/Group: Individual Therapy  Rich BraveLanier, Iram Chappell 11/27/2015, 12:07 PM

## 2015-11-27 NOTE — Progress Notes (Signed)
Subjective/Complaints: Feels nauseas this morning. A little weak after large paracentesis. Belly less distended   ROS: Denies CP, SOB, nausea, vomiting, diarrhea.  Objective: Vital Signs: Blood pressure 122/65, pulse 94, temperature 98.9 F (37.2 C), temperature source Oral, resp. rate 18, height 6' (1.829 m), weight 99.9 kg (220 lb 3.8 oz), SpO2 99 %. US Paracentesis  11/26/2015  INDICATION: Alcoholic cirrhosis with recurrent ascites. Request has been made for therapeutic paracentesis. EXAM: ULTRASOUND GUIDED THERAPEUTIC PARACENTESIS MEDICATIONS: 1% LIDOCAINE COMPLICATIONS: None immediate. PROCEDURE: Informed written consent was obtained from the patient after a discussion of the risks, benefits and alternatives to treatment. A timeout was performed prior to the initiation of the procedure. Initial ultrasound scanning demonstrates a large amount of ascites within the r left lower abdominal quadrant. The left lower abdomen was prepped and draped in the usual sterile fashion. 1% lidocaine was used for local anesthesia. Following this, a 19 gauge, 7-cm, Yueh catheter was introduced. An ultrasound image was saved for documentation purposes. The paracentesis was performed. The catheter was removed and a dressing was applied. The patient tolerated the procedure well without immediate post procedural complication. FINDINGS: A total of approximately 6 L of amber fluid was removed. IMPRESSION: Successful ultrasound-guided paracentesis yielding 6 liters of peritoneal fluid. Read by: Saverio Danker, PA-C Electronically Signed   By: Sandi Mariscal M.D.   On: 11/26/2015 13:47   Results for orders placed or performed during the hospital encounter of 11/13/15 (from the past 72 hour(s))  Glucose, capillary     Status: Abnormal   Collection Time: 11/24/15 11:31 AM  Result Value Ref Range   Glucose-Capillary 128 (H) 65 - 99 mg/dL   Comment 1 Notify RN   Glucose, capillary     Status: None   Collection Time: 11/24/15   4:32 PM  Result Value Ref Range   Glucose-Capillary 95 65 - 99 mg/dL   Comment 1 Notify RN   Glucose, capillary     Status: Abnormal   Collection Time: 11/24/15  9:08 PM  Result Value Ref Range   Glucose-Capillary 119 (H) 65 - 99 mg/dL  Glucose, capillary     Status: None   Collection Time: 11/25/15  6:40 AM  Result Value Ref Range   Glucose-Capillary 89 65 - 99 mg/dL  Ammonia     Status: Abnormal   Collection Time: 11/25/15  6:41 AM  Result Value Ref Range   Ammonia 70 (H) 9 - 35 umol/L  Comprehensive metabolic panel     Status: Abnormal   Collection Time: 11/25/15  6:41 AM  Result Value Ref Range   Sodium 132 (L) 135 - 145 mmol/L   Potassium 4.0 3.5 - 5.1 mmol/L   Chloride 102 101 - 111 mmol/L   CO2 18 (L) 22 - 32 mmol/L   Glucose, Bld 98 65 - 99 mg/dL   BUN 25 (H) 6 - 20 mg/dL   Creatinine, Ser 1.62 (H) 0.61 - 1.24 mg/dL   Calcium 8.0 (L) 8.9 - 10.3 mg/dL   Total Protein 7.0 6.5 - 8.1 g/dL   Albumin 2.0 (L) 3.5 - 5.0 g/dL   AST 61 (H) 15 - 41 U/L   ALT 34 17 - 63 U/L   Alkaline Phosphatase 55 38 - 126 U/L   Total Bilirubin 14.4 (H) 0.3 - 1.2 mg/dL   GFR calc non Af Amer 47 (L) >60 mL/min   GFR calc Af Amer 54 (L) >60 mL/min    Comment: (NOTE) The eGFR has been calculated  using the CKD EPI equation. This calculation has not been validated in all clinical situations. eGFR's persistently <60 mL/min signify possible Chronic Kidney Disease.    Anion gap 12 5 - 15  Glucose, capillary     Status: None   Collection Time: 11/25/15 12:06 PM  Result Value Ref Range   Glucose-Capillary 79 65 - 99 mg/dL   Comment 1 Notify RN   Glucose, capillary     Status: Abnormal   Collection Time: 11/25/15  5:16 PM  Result Value Ref Range   Glucose-Capillary 113 (H) 65 - 99 mg/dL  Glucose, capillary     Status: None   Collection Time: 11/25/15  9:18 PM  Result Value Ref Range   Glucose-Capillary 99 65 - 99 mg/dL   Comment 1 Notify RN   Glucose, capillary     Status: None    Collection Time: 11/26/15  6:31 AM  Result Value Ref Range   Glucose-Capillary 98 65 - 99 mg/dL   Comment 1 Notify RN   Glucose, capillary     Status: None   Collection Time: 11/26/15 12:00 PM  Result Value Ref Range   Glucose-Capillary 94 65 - 99 mg/dL   Comment 1 Notify RN   Glucose, capillary     Status: Abnormal   Collection Time: 11/26/15  4:28 PM  Result Value Ref Range   Glucose-Capillary 110 (H) 65 - 99 mg/dL   Comment 1 Notify RN   Glucose, capillary     Status: Abnormal   Collection Time: 11/26/15  8:59 PM  Result Value Ref Range   Glucose-Capillary 122 (H) 65 - 99 mg/dL  Comprehensive metabolic panel     Status: Abnormal   Collection Time: 11/27/15  5:59 AM  Result Value Ref Range   Sodium 132 (L) 135 - 145 mmol/L   Potassium 4.0 3.5 - 5.1 mmol/L   Chloride 102 101 - 111 mmol/L   CO2 17 (L) 22 - 32 mmol/L   Glucose, Bld 103 (H) 65 - 99 mg/dL   BUN 25 (H) 6 - 20 mg/dL   Creatinine, Ser 1.82 (H) 0.61 - 1.24 mg/dL   Calcium 8.2 (L) 8.9 - 10.3 mg/dL   Total Protein 6.6 6.5 - 8.1 g/dL   Albumin 2.2 (L) 3.5 - 5.0 g/dL   AST 56 (H) 15 - 41 U/L   ALT 31 17 - 63 U/L   Alkaline Phosphatase 53 38 - 126 U/L   Total Bilirubin 15.2 (H) 0.3 - 1.2 mg/dL   GFR calc non Af Amer 41 (L) >60 mL/min   GFR calc Af Amer 47 (L) >60 mL/min    Comment: (NOTE) The eGFR has been calculated using the CKD EPI equation. This calculation has not been validated in all clinical situations. eGFR's persistently <60 mL/min signify possible Chronic Kidney Disease.    Anion gap 13 5 - 15  Protime-INR     Status: Abnormal   Collection Time: 11/27/15  5:59 AM  Result Value Ref Range   Prothrombin Time 29.8 (H) 11.6 - 15.2 seconds   INR 2.90 (H) 0.00 - 1.49  Glucose, capillary     Status: Abnormal   Collection Time: 11/27/15  6:48 AM  Result Value Ref Range   Glucose-Capillary 102 (H) 65 - 99 mg/dL     HEENT: Normocephalic. icteric Cardio: Regular rate. Regular rhythm. no murmurs Resp: CTA  B/L and unlabored GI: Distention and ascites, appears decreased. Belly sl tender Musculoskeletal:  Edema in both thighs and the  feet. No tenderness Skin:   Other jaundice. Warm and dry. Neuro: Alert/Oriented Motor: 4+/5 bilateral deltoid, biceps, triceps, grip, 2/5 hip flexor knee extensor 4-/5 ankle dorsiflexor and plantar flexor Gen. no acute distress.  GU: scrotal edema/penile edema unchanged. Dried urine on bedsheet  Assessment/Plan: 1. Functional deficits secondary to Debilitation secondary to respiratory failure/sepsis, complications from cirrhosis of liver/ascites. which require 3+ hours per day of interdisciplinary therapy in a comprehensive inpatient rehab setting. Physiatrist is providing close team supervision and 24 hour management of active medical problems listed below. Physiatrist and rehab team continue to assess barriers to discharge/monitor patient progress toward functional and medical goals. FIM: Function - Bathing Bathing activity did not occur: Refused Position: Wheelchair/chair at sink Body parts bathed by patient: Right arm, Left arm, Chest, Abdomen, Right upper leg, Left upper leg, Front perineal area, Buttocks Body parts bathed by helper: Right lower leg, Left lower leg Assist Level: Touching or steadying assistance(Pt > 75%)  Function- Upper Body Dressing/Undressing What is the patient wearing?: Hospital gown Pull over shirt/dress - Perfomed by patient: Thread/unthread right sleeve, Thread/unthread left sleeve, Put head through opening, Pull shirt over trunk Pull over shirt/dress - Perfomed by helper: Pull shirt over trunk Assist Level: Set up Function - Lower Body Dressing/Undressing What is the patient wearing?: Hospital Gown Position: Wheelchair/chair at sink Underwear - Performed by helper: Thread/unthread right underwear leg, Thread/unthread left underwear leg, Pull underwear up/down Pants- Performed by patient: Pull pants up/down, Thread/unthread left  pants leg, Thread/unthread right pants leg Pants- Performed by helper: Pull pants up/down, Thread/unthread left pants leg, Thread/unthread right pants leg Non-skid slipper socks- Performed by helper: Don/doff right sock, Don/doff left sock Shoes - Performed by helper: Don/doff right shoe, Don/doff left shoe, Fasten right, Fasten left Assist for footwear: Dependant Assist for lower body dressing: Touching or steadying assistance (Pt > 75%)  Function - Toileting Toileting activity did not occur: N/A Toileting steps completed by patient: Adjust clothing prior to toileting, Performs perineal hygiene, Adjust clothing after toileting Toileting steps completed by helper: Performs perineal hygiene Toileting Assistive Devices: Grab bar or rail Assist level: Supervision or verbal cues  Function - Air cabin crew transfer activity did not occur: N/A Toilet transfer assistive device: Elevated toilet seat/BSC over toilet, Walker Assist level to toilet: Supervision or verbal cues Assist level from toilet: Supervision or verbal cues  Function - Chair/bed transfer Chair/bed transfer activity did not occur: N/A Chair/bed transfer method: Ambulatory, Stand pivot Chair/bed transfer assist level: Supervision or verbal cues Chair/bed transfer assistive device: Armrests, Walker Chair/bed transfer details: Verbal cues for technique, Verbal cues for precautions/safety, Verbal cues for safe use of DME/AE  Function - Locomotion: Wheelchair Will patient use wheelchair at discharge?: Yes Type: Manual Max wheelchair distance: 230 ft Assist Level: Supervision or verbal cues Assist Level: Supervision or verbal cues Wheel 150 feet activity did not occur: Safety/medical concerns Assist Level: Supervision or verbal cues Turns around,maneuvers to table,bed, and toilet,negotiates 3% grade,maneuvers on rugs and over doorsills: No Function - Locomotion: Ambulation Assistive device: Walker-rolling Max  distance: 150 ft Assist level: Supervision or verbal cues Assist level: Supervision or verbal cues Assist level: Supervision or verbal cues Walk 150 feet activity did not occur: Safety/medical concerns Assist level: Supervision or verbal cues Assist level: Moderate assist (Pt 50 - 74%)  Function - Comprehension Comprehension: Auditory Comprehension assist level: Understands complex 90% of the time/cues 10% of the time  Function - Expression Expression: Verbal Expression assist level: Expresses basic needs/ideas: With no  assist  Function - Social Interaction Social Interaction assist level: Interacts appropriately with others with medication or extra time (anti-anxiety, antidepressant).  Function - Problem Solving Problem solving assist level: Solves basic 75 - 89% of the time/requires cueing 10 - 24% of the time  Function - Memory Memory assist level: Recognizes or recalls 50 - 74% of the time/requires cueing 25 - 49% of the time Patient normally able to recall (first 3 days only): Current season, Location of own room, Staff names and faces, That he or she is in a hospital    Medical Problem List and Plan: 1.  Debilitation secondary to respiratory failure/sepsis, complications from cirrhosis of liver/ascites. Status post paracentesis 11/02/2015             - 24 hr care/SNF recommended--pt refuses  -appreciate Palliative care assistance. Pt remains determined to go home. Agreeable to hospice.   -team to work on arranging plan   -discussed the fact with him that because he's going home and is DNR, we won't be pursuing any further aggressive care including further paracenteses, etc 2.  DVT Prophylaxis/Anticoagulation: SCDs. Monitor for any signs of DVT 3. Pain Management: Tylenol as needed 4. Anemia/thrombocytopenia question DIC related to cirrhosis. Status post transfused multiple units. CBC Latest Ref Rng 11/16/2015 11/12/2015 11/11/2015  WBC 4.0 - 10.5 K/uL 11.5(H) 13.3(H) 13.5(H)   Hemoglobin 13.0 - 17.0 g/dL 9.1(L) 8.6(L) 8.6(L)  Hematocrit 39.0 - 52.0 % 27.6(L) 26.3(L) 25.4(L)  Platelets 150 - 400 K/uL 173 149(L) 141(L)    5. Neuropsych: This patient is capable of making decisions on his own behalf. 6. Skin/Wound Care: Routine skin checks 7. Fluids/Electrolytes/Nutrition: encourage po as possible. Appetite better with rx of ascites 8. Hypertension. No current antihypertensive medication. Follow with increased mobility 9. Acute renal insufficiency. BUN/Cr geberally holding despite diuretics---reviewed labs today  BMP Latest Ref Rng 11/27/2015 11/25/2015 11/22/2015  Glucose 65 - 99 mg/dL 103(H) 98 109(H)  BUN 6 - 20 mg/dL 25(H) 25(H) 25(H)  Creatinine 0.61 - 1.24 mg/dL 1.82(H) 1.62(H) 1.67(H)  Sodium 135 - 145 mmol/L 132(L) 132(L) 130(L)  Potassium 3.5 - 5.1 mmol/L 4.0 4.0 4.8  Chloride 101 - 111 mmol/L 102 102 101  CO2 22 - 32 mmol/L 17(L) 18(L) 18(L)  Calcium 8.9 - 10.3 mg/dL 8.2(L) 8.0(L) 8.2(L)    10. Sinus tachycardia with SVT versus atrial fibrillation. Amiodarone 200 mg daily.  11. Liver failure with ascites:  S/p numerous paracenteses  -paracentesis for 6L yesterday  -aldactone/lasix   -?TIPS    -LFT's remain fairly stable, recent ammonia level ok-  -d/w appropriate outpt plan considering above with GI 12. Penile/scrotal edema----local care/lidocaine gel for comfort, nystatin for skin  -continue foley at home for comfort 13. Diarrhea: soft to loose now  -continue probiotic   LOS (Days) 14 A FACE TO FACE EVALUATION WAS PERFORMED  [name] T 11/27/2015, 8:24 AM

## 2015-11-27 NOTE — Progress Notes (Addendum)
Occupational Therapy Session Note  Patient Details  Name: [name] MRN: [id number] Date of Birth: 03/28/1961  Today's Date: 11/27/2015 OT Individual Time: [id number] OT Individual Time Calculation (min): 44 min    Skilled Therapeutic Interventions/Progress Updates:   Had pt use his RW to ambulate to the therapy gym during session.  He needed one rest break at the elevators with O2 sats at 91% and HR 105.  Continued to the gym where he utilized the Nustep for 3 intervals of 5 mins.  First interval using both UEs and LEs with resistance set on level 5.  Heart rate increased to 115 after first set with O2 sats at 97% on room air.  Second set completed with resistance at the same level and pt asked to just use the lower extremities.  Average number of steps 50 compared to 44 with use of all extremities.  Finished last set with use of UEs only with average number of steps per minute at 40 as well.  Pt transferred back to the wheelchair stand pivot without use of an assistive device with supervision, but only taking very small steps.  Pt left in wheelchair beside bed per request.    Therapy Documentation Precautions:  Precautions Precautions: Fall Restrictions Weight Bearing Restrictions: No  Pain: Pain Assessment Pain Assessment: 0-10 Pain Score: 8  Pain Type: Acute pain Pain Location: Leg Pain Orientation: Right;Left Pain Intervention(s): Repositioned;Emotional support ADL: See Function Navigator for Current Functional Status.   Therapy/Group: Individual Therapy  Brynja Marker OTR/L 11/27/2015, 3:43 PM

## 2015-11-27 NOTE — Plan of Care (Signed)
Problem: RH PAIN MANAGEMENT Goal: RH STG PAIN MANAGED AT OR BELOW PT'S PAIN GOAL <3 on a 0-10 scale  Outcome: Not Progressing Abdominal pain 5 or higher throughout shift

## 2015-11-28 ENCOUNTER — Inpatient Hospital Stay (HOSPITAL_COMMUNITY): Payer: Federal, State, Local not specified - PPO | Admitting: Physical Therapy

## 2015-11-28 ENCOUNTER — Inpatient Hospital Stay (HOSPITAL_COMMUNITY): Payer: Federal, State, Local not specified - PPO

## 2015-11-28 LAB — CBC WITH DIFFERENTIAL/PLATELET
BASOS ABS: 0.1 10*3/uL (ref 0.0–0.1)
BASOS ABS: 0.1 10*3/uL (ref 0.0–0.1)
Basophils Relative: 1 %
Basophils Relative: 1 %
EOS ABS: 0.8 10*3/uL — AB (ref 0.0–0.7)
EOS ABS: 0.7 10*3/uL (ref 0.0–0.7)
EOS PCT: 5 %
Eosinophils Relative: 5 %
HCT: 14.1 % — ABNORMAL LOW (ref 39.0–52.0)
HEMOGLOBIN: 4.6 g/dL — AB (ref 13.0–17.0)
LYMPHS PCT: 6 %
Lymphocytes Relative: 6 %
Lymphs Abs: 0.9 10*3/uL (ref 0.7–4.0)
Lymphs Abs: 0.8 10*3/uL (ref 0.7–4.0)
MCH: 34.6 pg — AB (ref 26.0–34.0)
MCHC: 32.6 g/dL (ref 30.0–36.0)
MCV: 106 fL — ABNORMAL HIGH (ref 78.0–100.0)
MONO ABS: 2.6 10*3/uL — AB (ref 0.1–1.0)
MONO ABS: 2.5 10*3/uL — AB (ref 0.1–1.0)
Monocytes Relative: 18 %
Monocytes Relative: 18 %
NEUTROS ABS: 10 10*3/uL — AB (ref 1.7–7.7)
NEUTROS PCT: 70 %
Neutro Abs: 10 10*3/uL — ABNORMAL HIGH (ref 1.7–7.7)
Neutrophils Relative %: 70 %
Platelets: 188 10*3/uL (ref 150–400)
RBC: 1.33 MIL/uL — ABNORMAL LOW (ref 4.22–5.81)
WBC: 14.1 10*3/uL — AB (ref 4.0–10.5)

## 2015-11-28 LAB — CBC
HCT: 13.4 % — ABNORMAL LOW (ref 39.0–52.0)
Hemoglobin: 4.5 g/dL — CL (ref 13.0–17.0)
MCH: 35.4 pg — ABNORMAL HIGH (ref 26.0–34.0)
MCHC: 33.6 g/dL (ref 30.0–36.0)
MCV: 105.5 fL — ABNORMAL HIGH (ref 78.0–100.0)
PLATELETS: 182 10*3/uL (ref 150–400)
RBC: 1.27 MIL/uL — ABNORMAL LOW (ref 4.22–5.81)
RDW: 25.5 % — AB (ref 11.5–15.5)
WBC: 15.1 10*3/uL — AB (ref 4.0–10.5)

## 2015-11-28 LAB — BASIC METABOLIC PANEL
Anion gap: 13 (ref 5–15)
BUN: 31 mg/dL — AB (ref 6–20)
CALCIUM: 8.4 mg/dL — AB (ref 8.9–10.3)
CO2: 17 mmol/L — ABNORMAL LOW (ref 22–32)
CREATININE: 2.39 mg/dL — AB (ref 0.61–1.24)
Chloride: 100 mmol/L — ABNORMAL LOW (ref 101–111)
GFR calc Af Amer: 34 mL/min — ABNORMAL LOW (ref >60)
GFR, EST NON AFRICAN AMERICAN: 29 mL/min — AB (ref >60)
Glucose, Bld: 105 mg/dL — ABNORMAL HIGH (ref 65–99)
POTASSIUM: 4 mmol/L (ref 3.5–5.1)
SODIUM: 130 mmol/L — AB (ref 135–145)

## 2015-11-28 LAB — GLUCOSE, CAPILLARY
GLUCOSE-CAPILLARY: 105 mg/dL — AB (ref 65–99)
GLUCOSE-CAPILLARY: 118 mg/dL — AB (ref 65–99)
GLUCOSE-CAPILLARY: 109 mg/dL — AB (ref 65–99)
Glucose-Capillary: 112 mg/dL — ABNORMAL HIGH (ref 65–99)

## 2015-11-28 MED ORDER — OXYCODONE HCL 5 MG PO TABS
5.0000 mg | ORAL_TABLET | Freq: Four times a day (QID) | ORAL | Status: DC | PRN
Start: 2015-11-28 — End: 2015-11-30
  Administered 2015-11-29 (×2): 10 mg via ORAL
  Filled 2015-11-28 (×3): qty 2

## 2015-11-28 MED ORDER — MORPHINE SULFATE (PF) 2 MG/ML IV SOLN: 2.0000 mg | INTRAVENOUS | Status: DC | PRN

## 2015-11-28 NOTE — Progress Notes (Signed)
Subjective: No acute events.  Objective: Vital signs in last 24 hours: Temp:  [98.2 F (36.8 C)-98.4 F (36.9 C)] 98.4 F (36.9 C) (05/13 0516) Pulse Rate:  [102-105] 105 (05/13 0516) Resp:  [18-20] 20 (05/13 0516) BP: (103-104)/(63-64) 103/63 mmHg (05/13 0516) SpO2:  [99 %] 99 % (05/13 0516) Weight:  [93.8 kg (206 lb 12.7 oz)] 93.8 kg (206 lb 12.7 oz) (05/13 0516) Last BM Date: 11/26/15  Intake/Output from previous day: 05/12 0701 - 05/13 0700 In: 360 [P.O.:360] Out: 910 [Urine:910] Intake/Output this shift:    General appearance: alert and no distress GI: soft, distended with ascites, anasarca Extremities: 4+ pitting edema  Lab Results: No results for input(s): WBC, HGB, HCT, PLT in the last 72 hours. BMET  Recent Labs  11/27/15 0559  NA 132*  K 4.0  CL 102  CO2 17*  GLUCOSE 103*  BUN 25*  CREATININE 1.82*  CALCIUM 8.2*   LFT  Recent Labs  11/27/15 0559  PROT 6.6  ALBUMIN 2.2*  AST 56*  ALT 31  ALKPHOS 53  BILITOT 15.2*   PT/INR  Recent Labs  11/27/15 0559  LABPROT 29.8*  INR 2.90*   Hepatitis Panel No results for input(s): HEPBSAG, HCVAB, HEPAIGM, HEPBIGM in the last 72 hours. C-Diff No results for input(s): CDIFFTOX in the last 72 hours. Fecal Lactopherrin No results for input(s): FECLLACTOFRN in the last 72 hours.  Studies/Results: [place] Paracentesis  11/26/2015  INDICATION: Alcoholic cirrhosis with recurrent ascites. Request has been made for therapeutic paracentesis. EXAM: ULTRASOUND GUIDED THERAPEUTIC PARACENTESIS MEDICATIONS: 1% LIDOCAINE COMPLICATIONS: None immediate. PROCEDURE: Informed written consent was obtained from the patient after a discussion of the risks, benefits and alternatives to treatment. A timeout was performed prior to the initiation of the procedure. Initial ultrasound scanning demonstrates a large amount of ascites within the r left lower abdominal quadrant. The left lower abdomen was prepped and draped in the usual  sterile fashion. 1% lidocaine was used for local anesthesia. Following this, a 19 gauge, 7-cm, Yueh catheter was introduced. An ultrasound image was saved for documentation purposes. The paracentesis was performed. The catheter was removed and a dressing was applied. The patient tolerated the procedure well without immediate post procedural complication. FINDINGS: A total of approximately 6 L of amber fluid was removed. IMPRESSION: Successful ultrasound-guided paracentesis yielding 6 liters of peritoneal fluid. Read by: Barnetta ChapelKelly Osborne, PA-C Electronically Signed   By: Simonne ComeJohn  Watts M.D.   On: 11/26/2015 13:47    Medications:  Scheduled: . amiodarone  200 mg Oral Daily  . folic acid  1 mg Oral Daily  . furosemide  60 mg Oral Daily  . insulin aspart  0-9 Units Subcutaneous TID WC  . lactulose  30 g Oral Daily  . loratadine  10 mg Oral Daily  . nystatin   Topical BID  . oxymetazoline  1 spray Each Nare BID  . pantoprazole  40 mg Oral Daily  . saccharomyces boulardii  250 mg Oral BID  . spironolactone  150 mg Oral Daily  . thiamine  100 mg Oral Daily   Continuous:   Assessment/Plan: 1) ETOH cirrhosis. 2) Anasarca. 3) Renal insufficiency.   The creatinine is pending for this AM.  He is on Lasix 60 mg and spironolactone 150 mg.  Pending the results of his creatinine he may need to be discontinued with his diuretics and/or require albumin infusion.  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  His abdomen is distended with ascites and he will  most likely require repeated large volume paracenteses with albumin infusion.    Plan: 1)  Await BMP result. 2) Continue with supportive care.  LOS: 15 days   Cleta Heatley D 11/28/2015, 7:28 AM

## 2015-11-28 NOTE — Progress Notes (Signed)
Addendum.  Pt's hgb confirmed on repeat lab---4.6.  Pt remains asymptomatic/comfortable. HR is recorded in 100's by RN. Was in the 70's to 80's when I auscultated bedside this morning. Will check CT of abdomen to see if there is an acute source of blood loss. Heme check next stool as well. Pt is unsure if he even wants a blood transfusion given plan for hospice---we can take next steps based upon results of work up.    Bradley OysterZachary T. Kiki Bivens, MD, [name] HospitalFAAPMR [facility] 11/28/2015

## 2015-11-28 NOTE — Progress Notes (Signed)
Subjective/Complaints: Feels better today. Slept well. No pain. Has appetite. Tolerated therapy fairly well yesterday   ROS: Denies CP, SOB, palpitations, nausea, vomiting. Continued edema.   Objective: Vital Signs: Blood pressure 103/63, pulse 105, temperature 98.4 F (36.9 C), temperature source Oral, resp. rate 20, height 6' (1.829 m), weight 93.8 kg (206 lb 12.7 oz), SpO2 99 %. US Paracentesis  11/26/2015  INDICATION: Alcoholic cirrhosis with recurrent ascites. Request has been made for therapeutic paracentesis. EXAM: ULTRASOUND GUIDED THERAPEUTIC PARACENTESIS MEDICATIONS: 1% LIDOCAINE COMPLICATIONS: None immediate. PROCEDURE: Informed written consent was obtained from the patient after a discussion of the risks, benefits and alternatives to treatment. A timeout was performed prior to the initiation of the procedure. Initial ultrasound scanning demonstrates a large amount of ascites within the r left lower abdominal quadrant. The left lower abdomen was prepped and draped in the usual sterile fashion. 1% lidocaine was used for local anesthesia. Following this, a 19 gauge, 7-cm, Yueh catheter was introduced. An ultrasound image was saved for documentation purposes. The paracentesis was performed. The catheter was removed and a dressing was applied. The patient tolerated the procedure well without immediate post procedural complication. FINDINGS: A total of approximately 6 L of amber fluid was removed. IMPRESSION: Successful ultrasound-guided paracentesis yielding 6 liters of peritoneal fluid. Read by: Saverio Danker, PA-C Electronically Signed   By: Sandi Mariscal M.D.   On: 11/26/2015 13:47   Results for orders placed or performed during the hospital encounter of 11/13/15 (from the past 72 hour(s))  Glucose, capillary     Status: None   Collection Time: 11/25/15 12:06 PM  Result Value Ref Range   Glucose-Capillary 79 65 - 99 mg/dL   Comment 1 Notify RN   Glucose, capillary     Status: Abnormal    Collection Time: 11/25/15  5:16 PM  Result Value Ref Range   Glucose-Capillary 113 (H) 65 - 99 mg/dL  Glucose, capillary     Status: None   Collection Time: 11/25/15  9:18 PM  Result Value Ref Range   Glucose-Capillary 99 65 - 99 mg/dL   Comment 1 Notify RN   Glucose, capillary     Status: None   Collection Time: 11/26/15  6:31 AM  Result Value Ref Range   Glucose-Capillary 98 65 - 99 mg/dL   Comment 1 Notify RN   Glucose, capillary     Status: None   Collection Time: 11/26/15 12:00 PM  Result Value Ref Range   Glucose-Capillary 94 65 - 99 mg/dL   Comment 1 Notify RN   Glucose, capillary     Status: Abnormal   Collection Time: 11/26/15  4:28 PM  Result Value Ref Range   Glucose-Capillary 110 (H) 65 - 99 mg/dL   Comment 1 Notify RN   Glucose, capillary     Status: Abnormal   Collection Time: 11/26/15  8:59 PM  Result Value Ref Range   Glucose-Capillary 122 (H) 65 - 99 mg/dL  Comprehensive metabolic panel     Status: Abnormal   Collection Time: 11/27/15  5:59 AM  Result Value Ref Range   Sodium 132 (L) 135 - 145 mmol/L   Potassium 4.0 3.5 - 5.1 mmol/L   Chloride 102 101 - 111 mmol/L   CO2 17 (L) 22 - 32 mmol/L   Glucose, Bld 103 (H) 65 - 99 mg/dL   BUN 25 (H) 6 - 20 mg/dL   Creatinine, Ser 1.82 (H) 0.61 - 1.24 mg/dL   Calcium 8.2 (L) 8.9 - 10.3  mg/dL   Total Protein 6.6 6.5 - 8.1 g/dL   Albumin 2.2 (L) 3.5 - 5.0 g/dL   AST 56 (H) 15 - 41 U/L   ALT 31 17 - 63 U/L   Alkaline Phosphatase 53 38 - 126 U/L   Total Bilirubin 15.2 (H) 0.3 - 1.2 mg/dL   GFR calc non Af Amer 41 (L) >60 mL/min   GFR calc Af Amer 47 (L) >60 mL/min    Comment: (NOTE) The eGFR has been calculated using the CKD EPI equation. This calculation has not been validated in all clinical situations. eGFR's persistently <60 mL/min signify possible Chronic Kidney Disease.    Anion gap 13 5 - 15  Protime-INR     Status: Abnormal   Collection Time: 11/27/15  5:59 AM  Result Value Ref Range   Prothrombin  Time 29.8 (H) 11.6 - 15.2 seconds   INR 2.90 (H) 0.00 - 1.49  Glucose, capillary     Status: Abnormal   Collection Time: 11/27/15  6:48 AM  Result Value Ref Range   Glucose-Capillary 102 (H) 65 - 99 mg/dL  Glucose, capillary     Status: Abnormal   Collection Time: 11/27/15 11:53 AM  Result Value Ref Range   Glucose-Capillary 128 (H) 65 - 99 mg/dL   Comment 1 Notify RN   Glucose, capillary     Status: Abnormal   Collection Time: 11/27/15  4:40 PM  Result Value Ref Range   Glucose-Capillary 121 (H) 65 - 99 mg/dL  Glucose, capillary     Status: Abnormal   Collection Time: 11/27/15  8:44 PM  Result Value Ref Range   Glucose-Capillary 124 (H) 65 - 99 mg/dL   Comment 1 Notify RN   Basic metabolic panel     Status: Abnormal   Collection Time: 11/28/15  7:02 AM  Result Value Ref Range   Sodium 130 (L) 135 - 145 mmol/L   Potassium 4.0 3.5 - 5.1 mmol/L   Chloride 100 (L) 101 - 111 mmol/L   CO2 17 (L) 22 - 32 mmol/L   Glucose, Bld 105 (H) 65 - 99 mg/dL   BUN 31 (H) 6 - 20 mg/dL   Creatinine, Ser 2.39 (H) 0.61 - 1.24 mg/dL   Calcium 8.4 (L) 8.9 - 10.3 mg/dL   GFR calc non Af Amer 29 (L) >60 mL/min   GFR calc Af Amer 34 (L) >60 mL/min    Comment: (NOTE) The eGFR has been calculated using the CKD EPI equation. This calculation has not been validated in all clinical situations. eGFR's persistently <60 mL/min signify possible Chronic Kidney Disease.    Anion gap 13 5 - 15  CBC     Status: Abnormal   Collection Time: 11/28/15  7:02 AM  Result Value Ref Range   WBC 15.1 (H) 4.0 - 10.5 K/uL   RBC 1.27 (L) 4.22 - 5.81 MIL/uL   Hemoglobin 4.5 (LL) 13.0 - 17.0 g/dL    Comment: REPEATED TO VERIFY CRITICAL RESULT CALLED TO, READ BACK BY AND VERIFIED WITH: [facility] BABER RN AT 0223 11/28/15 BY WOOLLENK    HCT 13.4 (L) 39.0 - 52.0 %   MCV 105.5 (H) 78.0 - 100.0 fL   MCH 35.4 (H) 26.0 - 34.0 pg   MCHC 33.6 30.0 - 36.0 g/dL   RDW 25.5 (H) 11.5 - 15.5 %   Platelets 182 150 - 400 K/uL  CBC  with Differential/Platelet     Status: Abnormal   Collection Time: 11/28/15  7:02  AM  Result Value Ref Range   WBC DUPLICATE REQUEST 4.0 - 22.0 K/uL   RBC DUPLICATE REQUEST 2.54 - 5.81 MIL/uL   Hemoglobin DUPLICATE REQUEST 27.0 - 62.3 g/dL   HCT DUPLICATE REQUEST 76.2 - 83.1 %   MCV DUPLICATE REQUEST 51.7 - 616.0 fL   MCH DUPLICATE REQUEST 73.7 - 10.6 pg   MCHC DUPLICATE REQUEST 26.9 - 48.5 g/dL   RDW DUPLICATE REQUEST 46.2 - 15.5 %   Platelets DUPLICATE REQUEST 703 - 400 K/uL   Neutrophils Relative % 70 %   Neutro Abs 10.0 (H) 1.7 - 7.7 K/uL   Lymphocytes Relative 6 %   Lymphs Abs 0.9 0.7 - 4.0 K/uL   Monocytes Relative 18 %   Monocytes Absolute 2.6 (H) 0.1 - 1.0 K/uL   Eosinophils Relative 5 %   Eosinophils Absolute 0.8 (H) 0.0 - 0.7 K/uL   Basophils Relative 1 %   Basophils Absolute 0.1 0.0 - 0.1 K/uL  Glucose, capillary     Status: Abnormal   Collection Time: 11/28/15  7:28 AM  Result Value Ref Range   Glucose-Capillary 105 (H) 65 - 99 mg/dL   Comment 1 Notify RN      HEENT: Normocephalic. icteric Cardio: Regular rate. Regular rhythm. no murmurs. HR in 70's. Resp: CTA B/L and unlabored GI: Distention and ascites, appears decreased. Belly minimally tender today Musculoskeletal:  Persistent Edema in both thighs and the feet. No tenderness Skin:   Other jaundice. Warm and dry. Neuro: Alert/Oriented Motor: 4+/5 bilateral deltoid, biceps, triceps, grip, 2/5 hip flexor knee extensor 4-/5 ankle dorsiflexor and plantar flexor Gen. no acute distress. Appears comfortable.  GU: scrotal edema/penile edema unchanged. Dried urine on bedsheet Psych: pleasant, seems to be in good spirits today  Assessment/Plan: 1. Functional deficits secondary to Debilitation secondary to respiratory failure/sepsis, complications from cirrhosis of liver/ascites. which require 3+ hours per day of interdisciplinary therapy in a comprehensive inpatient rehab setting. Physiatrist is providing close team  supervision and 24 hour management of active medical problems listed below. Physiatrist and rehab team continue to assess barriers to discharge/monitor patient progress toward functional and medical goals. FIM: Function - Bathing Bathing activity did not occur: Refused Position: Wheelchair/chair at sink Body parts bathed by patient: Right arm, Left arm, Chest, Abdomen, Right upper leg, Left upper leg, Front perineal area, Buttocks Body parts bathed by helper: Right lower leg, Left lower leg Assist Level: Touching or steadying assistance(Pt > 75%)  Function- Upper Body Dressing/Undressing What is the patient wearing?: Hospital gown Pull over shirt/dress - Perfomed by patient: Thread/unthread right sleeve, Thread/unthread left sleeve, Put head through opening, Pull shirt over trunk Pull over shirt/dress - Perfomed by helper: Pull shirt over trunk Assist Level: Set up Function - Lower Body Dressing/Undressing What is the patient wearing?: Hospital Gown Position: Wheelchair/chair at sink Underwear - Performed by helper: Thread/unthread right underwear leg, Thread/unthread left underwear leg, Pull underwear up/down Pants- Performed by patient: Pull pants up/down, Thread/unthread left pants leg, Thread/unthread right pants leg Pants- Performed by helper: Pull pants up/down, Thread/unthread left pants leg, Thread/unthread right pants leg Non-skid slipper socks- Performed by helper: Don/doff right sock, Don/doff left sock Shoes - Performed by helper: Don/doff right shoe, Don/doff left shoe, Fasten right, Fasten left Assist for footwear: Dependant Assist for lower body dressing: Touching or steadying assistance (Pt > 75%)  Function - Toileting Toileting activity did not occur: N/A Toileting steps completed by patient: Adjust clothing prior to toileting, Performs perineal hygiene, Adjust clothing after  toileting Toileting steps completed by helper: Performs perineal hygiene Toileting Assistive  Devices: Grab bar or rail Assist level: Supervision or verbal cues  Function - Air cabin crew transfer activity did not occur: N/A Toilet transfer assistive device: Elevated toilet seat/BSC over toilet, Walker Assist level to toilet: Supervision or verbal cues Assist level from toilet: Supervision or verbal cues  Function - Chair/bed transfer Chair/bed transfer activity did not occur: N/A Chair/bed transfer method: Ambulatory, Stand pivot Chair/bed transfer assist level: Supervision or verbal cues Chair/bed transfer assistive device: Armrests, Walker Chair/bed transfer details: Verbal cues for technique, Verbal cues for precautions/safety, Verbal cues for safe use of DME/AE  Function - Locomotion: Wheelchair Will patient use wheelchair at discharge?: Yes Type: Manual Max wheelchair distance: 200 Assist Level: Supervision or verbal cues Assist Level: Supervision or verbal cues Wheel 150 feet activity did not occur: Safety/medical concerns Assist Level: Supervision or verbal cues Turns around,maneuvers to table,bed, and toilet,negotiates 3% grade,maneuvers on rugs and over doorsills: No Function - Locomotion: Ambulation Assistive device: Walker-rolling Max distance: 175 Assist level: Supervision or verbal cues Assist level: Supervision or verbal cues Assist level: Supervision or verbal cues Walk 150 feet activity did not occur: Safety/medical concerns Assist level: Supervision or verbal cues Assist level: Moderate assist (Pt 50 - 74%)  Function - Comprehension Comprehension: Auditory Comprehension assist level: Understands complex 90% of the time/cues 10% of the time  Function - Expression Expression: Verbal Expression assist level: Expresses basic needs/ideas: With no assist  Function - Social Interaction Social Interaction assist level: Interacts appropriately with others with medication or extra time (anti-anxiety, antidepressant).  Function - Problem  Solving Problem solving assist level: Solves basic 75 - 89% of the time/requires cueing 10 - 24% of the time  Function - Memory Memory assist level: Recognizes or recalls 50 - 74% of the time/requires cueing 25 - 49% of the time Patient normally able to recall (first 3 days only): Current season, Location of own room, Staff names and faces, That he or she is in a hospital    Medical Problem List and Plan: 1.  Debilitation secondary to respiratory failure/sepsis, complications from cirrhosis of liver/ascites. Status post paracentesis 11/02/2015             - 24 hr care/SNF recommended--pt refuses  -appreciate Palliative care assistance. Pt remains determined to go home. Agreeable to hospice.   -team to work on arranging plan   -discussed the fact with him that because he's going home and is DNR, we won't be pursuing any further aggressive care including further paracenteses, etc 2.  DVT Prophylaxis/Anticoagulation: SCDs. Monitor for any signs of DVT 3. Pain Management: Tylenol as needed 4. Anemia/thrombocytopenia question DIC related to cirrhosis. Status post transfused multiple units.  -I question validity of labs today. Repeat testing requested and pending.  -pt shows no signs of acute blood loss this morning CBC Latest Ref Rng 11/28/2015 11/28/2015 11/16/2015  WBC 4.0 - 41.7 K/uL DUPLICATE REQUEST 15.1(H) 11.5(H)  Hemoglobin 40.8 - 14.4 g/dL DUPLICATE REQUEST 8.1(EH) 9.1(L)  Hematocrit 63.1 - 49.7 % DUPLICATE REQUEST 13.4(L) 27.6(L)  Platelets 026 - 378 K/uL DUPLICATE REQUEST [phone]    5. Neuropsych: This patient is capable of making decisions on his own behalf. 6. Skin/Wound Care: Routine skin checks 7. Fluids/Electrolytes/Nutrition: encourage po as possible. Appetite better with rx of ascites 8. Hypertension. No current antihypertensive medication. Follow with increased mobility 9. Acute renal insufficiency. BUN/Cr geberally holding despite diuretics---reviewed labs today  BMP Latest  Ref  Rng 11/28/2015 11/27/2015 11/25/2015  Glucose 65 - 99 mg/dL 105(H) 103(H) 98  BUN 6 - 20 mg/dL 31(H) 25(H) 25(H)  Creatinine 0.61 - 1.24 mg/dL 2.39(H) 1.82(H) 1.62(H)  Sodium 135 - 145 mmol/L 130(L) 132(L) 132(L)  Potassium 3.5 - 5.1 mmol/L 4.0 4.0 4.0  Chloride 101 - 111 mmol/L 100(L) 102 102  CO2 22 - 32 mmol/L 17(L) 17(L) 18(L)  Calcium 8.9 - 10.3 mg/dL 8.4(L) 8.2(L) 8.0(L)    10. Sinus tachycardia with SVT versus atrial fibrillation. Amiodarone 200 mg daily.  11. Liver failure with ascites:  S/p numerous paracenteses  -paracentesis for 6L most recently  -aldactone/lasix   -LFT's remain fairly stable, recent ammonia level ok-  -d/w appropriate outpt plan considering above with GI 12. Penile/scrotal edema----some improvement  -continue foley at home for comfort 13. Diarrhea: stool ranges from soft to loose   -continue probiotic   LOS (Days) 15 A FACE TO FACE EVALUATION WAS PERFORMED  SWARTZ,ZACHARY T 11/28/2015, 8:47 AM

## 2015-11-28 NOTE — Progress Notes (Signed)
Reviewed CT findings with radiology. Increased ascites with hemoperitoneum accounting for blood loss. Discussed options with Dr. Elnoria HowardHung who feels that given patient's hospice status and poor clinical picture that further intervention is not appropriate. He agrees with comfort care. I discussed with the patient who does not wish to pursue further intervention or blood transfusions either. We will attempt to keep him comfortable as possible and make plans for home hospice. I called the patient's brother to keep him informed of the patient's status and the plan. He was appreciative. Will re-check hgb in am to see if he stabilizes at all. Nursing aware of plan as well.  Ranelle OysterZachary T. Swartz, MD, [facility] 11/28/2015

## 2015-11-28 NOTE — Progress Notes (Signed)
Physical Therapy Note  Patient Details  Name: [name] MRN: [id number] Date of Birth: 11/10/1960 Today's Date: 11/28/2015    Pt scheduled for PT at 1300. Notified by pt's nurse redraw hemoglobin 4.6. Treatment held as per nursing. Pt's nurse to call MD for further orders.    [name], [name] 11/28/2015, 1:11 PM

## 2015-11-29 ENCOUNTER — Inpatient Hospital Stay (HOSPITAL_COMMUNITY): Payer: Federal, State, Local not specified - PPO | Admitting: Physical Therapy

## 2015-11-29 LAB — HEMOGLOBIN AND HEMATOCRIT, BLOOD
HCT: 14.2 % — ABNORMAL LOW (ref 39.0–52.0)
Hemoglobin: 4.8 g/dL — CL (ref 13.0–17.0)

## 2015-11-29 LAB — GLUCOSE, CAPILLARY
GLUCOSE-CAPILLARY: 117 mg/dL — AB (ref 65–99)
GLUCOSE-CAPILLARY: 108 mg/dL — AB (ref 65–99)
GLUCOSE-CAPILLARY: 105 mg/dL — AB (ref 65–99)
Glucose-Capillary: 86 mg/dL (ref 65–99)

## 2015-11-29 NOTE — Plan of Care (Signed)
Problem: RH BOWEL ELIMINATION Goal: RH STG MANAGE BOWEL WITH ASSISTANCE STG Manage Bowel with mod Assistance.  Outcome: Not Progressing Lactulose daily with no BM for 3 days now.  Problem: RH BLADDER ELIMINATION Goal: RH STG MANAGE BLADDER WITH ASSISTANCE STG Manage Bladder With mod Assistance  Outcome: Not Progressing Coude foley catheter due to acute urinary retention; most likely will go home with foley

## 2015-11-29 NOTE — Progress Notes (Signed)
Subjective/Complaints: Fairly uneventful night. Able to sleep. Pain controlled. Only used oxy 21m for pain. Brother at bedside   ROS: Denies CP, SOB, palpitations, nausea, vomiting. Continued edema.   Objective: Vital Signs: Blood pressure 115/65, pulse 96, temperature 100 F (37.8 C), temperature source Oral, resp. rate 20, height 6' (1.829 m), weight 93.6 kg (206 lb 5.6 oz), SpO2 99 %. Ct Abdomen Pelvis Wo Contrast  11/28/2015  ADDENDUM REPORT: 11/28/2015 17:32 ADDENDUM: These results were called by telephone at the time of interpretation on 11/28/2015 at 5:32 pm to Dr. ZAlger Simons, who verbally acknowledged these results. Electronically Signed   By: CFranchot GalloM.D.   On: 11/28/2015 17:32  11/28/2015  CLINICAL DATA:  Low hemoglobin. Recent paracentesis 11/26/2015. Cirrhosis with ascites. EXAM: CT ABDOMEN AND PELVIS WITHOUT CONTRAST TECHNIQUE: Multidetector CT imaging of the abdomen and pelvis was performed following the standard protocol without IV contrast. COMPARISON:  CT abdomen pelvis 11/06/2015 FINDINGS: Lower chest:  Lung bases clear.  Elevated right hemidiaphragm Hepatobiliary: Cirrhotic liver. Small irregular liver without mass lesion. Large amount of ascites in the abdomen has increased since the prior study. There is fluid in all 4 quadrants which has progressed. The fluid in the pelvis shows slightly diffuse increased density suggesting possible hemorrhage. There is layering high density material in the left pelvis which is consistent with clot. This was not present previously. This would account for the patient's drop in hemoglobin. High density within the gallbladder is unchanged most consistent with small stones. Pancreas: Negative Spleen: Normal splenic size Adrenals/Urinary Tract: Negative for renal mass or obstruction. No renal calculi. Foley catheter in the bladder which is decompressed. Stomach/Bowel: Colonic dilatation compatible with ileus. No bowel thickening. Small bowel  nondilated. Gas in the rectum. Vascular/Lymphatic: Negative for aortic aneurysm. No mass or lymphadenopathy. Reproductive: Negative Other: Negative Musculoskeletal: Negative IMPRESSION: Cirrhotic liver. Large amount of ascites has progressed since 11/06/2015. The patient had recent paracentesis on 11/26/2015. There is blood clot in the ascitic fluid in the pelvis compatible with hemoperitoneum and ascites. Colonic ileus. Electronically Signed: By: CFranchot GalloM.D. On: 11/28/2015 17:22   Results for orders placed or performed during the hospital encounter of 11/13/15 (from the past 72 hour(s))  Glucose, capillary     Status: None   Collection Time: 11/26/15 12:00 PM  Result Value Ref Range   Glucose-Capillary 94 65 - 99 mg/dL   Comment 1 Notify RN   Glucose, capillary     Status: Abnormal   Collection Time: 11/26/15  4:28 PM  Result Value Ref Range   Glucose-Capillary 110 (H) 65 - 99 mg/dL   Comment 1 Notify RN   Glucose, capillary     Status: Abnormal   Collection Time: 11/26/15  8:59 PM  Result Value Ref Range   Glucose-Capillary 122 (H) 65 - 99 mg/dL  Comprehensive metabolic panel     Status: Abnormal   Collection Time: 11/27/15  5:59 AM  Result Value Ref Range   Sodium 132 (L) 135 - 145 mmol/L   Potassium 4.0 3.5 - 5.1 mmol/L   Chloride 102 101 - 111 mmol/L   CO2 17 (L) 22 - 32 mmol/L   Glucose, Bld 103 (H) 65 - 99 mg/dL   BUN 25 (H) 6 - 20 mg/dL   Creatinine, Ser 1.82 (H) 0.61 - 1.24 mg/dL   Calcium 8.2 (L) 8.9 - 10.3 mg/dL   Total Protein 6.6 6.5 - 8.1 g/dL   Albumin 2.2 (L) 3.5 - 5.0 g/dL  AST 56 (H) 15 - 41 U/L   ALT 31 17 - 63 U/L   Alkaline Phosphatase 53 38 - 126 U/L   Total Bilirubin 15.2 (H) 0.3 - 1.2 mg/dL   GFR calc non Af Amer 41 (L) >60 mL/min   GFR calc Af Amer 47 (L) >60 mL/min    Comment: (NOTE) The eGFR has been calculated using the CKD EPI equation. This calculation has not been validated in all clinical situations. eGFR's persistently <60 mL/min  signify possible Chronic Kidney Disease.    Anion gap 13 5 - 15  Protime-INR     Status: Abnormal   Collection Time: 11/27/15  5:59 AM  Result Value Ref Range   Prothrombin Time 29.8 (H) 11.6 - 15.2 seconds   INR 2.90 (H) 0.00 - 1.49  Glucose, capillary     Status: Abnormal   Collection Time: 11/27/15  6:48 AM  Result Value Ref Range   Glucose-Capillary 102 (H) 65 - 99 mg/dL  Glucose, capillary     Status: Abnormal   Collection Time: 11/27/15 11:53 AM  Result Value Ref Range   Glucose-Capillary 128 (H) 65 - 99 mg/dL   Comment 1 Notify RN   Glucose, capillary     Status: Abnormal   Collection Time: 11/27/15  4:40 PM  Result Value Ref Range   Glucose-Capillary 121 (H) 65 - 99 mg/dL  Glucose, capillary     Status: Abnormal   Collection Time: 11/27/15  8:44 PM  Result Value Ref Range   Glucose-Capillary 124 (H) 65 - 99 mg/dL   Comment 1 Notify RN   Basic metabolic panel     Status: Abnormal   Collection Time: 11/28/15  7:02 AM  Result Value Ref Range   Sodium 130 (L) 135 - 145 mmol/L   Potassium 4.0 3.5 - 5.1 mmol/L   Chloride 100 (L) 101 - 111 mmol/L   CO2 17 (L) 22 - 32 mmol/L   Glucose, Bld 105 (H) 65 - 99 mg/dL   BUN 31 (H) 6 - 20 mg/dL   Creatinine, Ser 2.39 (H) 0.61 - 1.24 mg/dL   Calcium 8.4 (L) 8.9 - 10.3 mg/dL   GFR calc non Af Amer 29 (L) >60 mL/min   GFR calc Af Amer 34 (L) >60 mL/min    Comment: (NOTE) The eGFR has been calculated using the CKD EPI equation. This calculation has not been validated in all clinical situations. eGFR's persistently <60 mL/min signify possible Chronic Kidney Disease.    Anion gap 13 5 - 15  CBC     Status: Abnormal   Collection Time: 11/28/15  7:02 AM  Result Value Ref Range   WBC 15.1 (H) 4.0 - 10.5 K/uL   RBC 1.27 (L) 4.22 - 5.81 MIL/uL   Hemoglobin 4.5 (LL) 13.0 - 17.0 g/dL    Comment: REPEATED TO VERIFY CRITICAL RESULT CALLED TO, READ BACK BY AND VERIFIED WITH: [facility] BABER RN AT 9628 11/28/15 BY WOOLLENK    HCT 13.4  (L) 39.0 - 52.0 %   MCV 105.5 (H) 78.0 - 100.0 fL   MCH 35.4 (H) 26.0 - 34.0 pg   MCHC 33.6 30.0 - 36.0 g/dL   RDW 25.5 (H) 11.5 - 15.5 %   Platelets 182 150 - 400 K/uL  CBC with Differential/Platelet     Status: Abnormal   Collection Time: 11/28/15  7:02 AM  Result Value Ref Range   WBC DUPLICATE REQUEST 4.0 - 36.6 K/uL   RBC DUPLICATE REQUEST 2.94 -  5.81 MIL/uL   Hemoglobin DUPLICATE REQUEST 33.3 - 54.5 g/dL   HCT DUPLICATE REQUEST 62.5 - 63.8 %   MCV DUPLICATE REQUEST 93.7 - 342.8 fL   MCH DUPLICATE REQUEST 76.8 - 11.5 pg   MCHC DUPLICATE REQUEST 72.6 - 20.3 g/dL   RDW DUPLICATE REQUEST 55.9 - 15.5 %   Platelets DUPLICATE REQUEST 741 - 400 K/uL   Neutrophils Relative % 70 %   Neutro Abs 10.0 (H) 1.7 - 7.7 K/uL   Lymphocytes Relative 6 %   Lymphs Abs 0.9 0.7 - 4.0 K/uL   Monocytes Relative 18 %   Monocytes Absolute 2.6 (H) 0.1 - 1.0 K/uL   Eosinophils Relative 5 %   Eosinophils Absolute 0.8 (H) 0.0 - 0.7 K/uL   Basophils Relative 1 %   Basophils Absolute 0.1 0.0 - 0.1 K/uL  Glucose, capillary     Status: Abnormal   Collection Time: 11/28/15  7:28 AM  Result Value Ref Range   Glucose-Capillary 105 (H) 65 - 99 mg/dL   Comment 1 Notify RN   Glucose, capillary     Status: Abnormal   Collection Time: 11/28/15 11:54 AM  Result Value Ref Range   Glucose-Capillary 118 (H) 65 - 99 mg/dL   Comment 1 Notify RN   CBC with Differential/Platelet     Status: Abnormal   Collection Time: 11/28/15 12:39 PM  Result Value Ref Range   WBC 14.1 (H) 4.0 - 10.5 K/uL   RBC 1.33 (L) 4.22 - 5.81 MIL/uL   Hemoglobin 4.6 (LL) 13.0 - 17.0 g/dL    Comment: REPEATED TO VERIFY CRITICAL RESULT CALLED TO, READ BACK BY AND VERIFIED WITH: ROYAL,A RN 11/28/15 1255 WOOTEN,K    HCT 14.1 (L) 39.0 - 52.0 %   MCV 106.0 (H) 78.0 - 100.0 fL   MCH 34.6 (H) 26.0 - 34.0 pg   MCHC 32.6 30.0 - 36.0 g/dL   Platelets 188 150 - 400 K/uL    Comment: PLATELET COUNT CONFIRMED BY SMEAR   Neutrophils Relative % 70 %    Lymphocytes Relative 6 %   Monocytes Relative 18 %   Eosinophils Relative 5 %   Basophils Relative 1 %   Neutro Abs 10.0 (H) 1.7 - 7.7 K/uL   Lymphs Abs 0.8 0.7 - 4.0 K/uL   Monocytes Absolute 2.5 (H) 0.1 - 1.0 K/uL   Eosinophils Absolute 0.7 0.0 - 0.7 K/uL   Basophils Absolute 0.1 0.0 - 0.1 K/uL   RBC Morphology MARKED POLYCHROMASIA     Comment: SCHISTOCYTES NOTED ON SMEAR ACANTHOCYTES TARGET CELLS   Glucose, capillary     Status: Abnormal   Collection Time: 11/28/15  4:57 PM  Result Value Ref Range   Glucose-Capillary 109 (H) 65 - 99 mg/dL   Comment 1 Notify RN   Glucose, capillary     Status: Abnormal   Collection Time: 11/28/15  9:02 PM  Result Value Ref Range   Glucose-Capillary 112 (H) 65 - 99 mg/dL   Comment 1 Notify RN   Hemoglobin and hematocrit, blood     Status: Abnormal   Collection Time: 11/29/15  6:48 AM  Result Value Ref Range   Hemoglobin 4.8 (LL) 13.0 - 17.0 g/dL    Comment: REPEATED TO VERIFY CRITICAL VALUE NOTED.  VALUE IS CONSISTENT WITH PREVIOUSLY REPORTED AND CALLED VALUE.    HCT 14.2 (L) 39.0 - 52.0 %  Glucose, capillary     Status: None   Collection Time: 11/29/15  7:07 AM  Result Value  Ref Range   Glucose-Capillary 86 65 - 99 mg/dL   Comment 1 Notify RN      HEENT: Normocephalic. icteric Cardio: Regular rate. Regular rhythm. no murmurs. HR in 70's. Resp: CTA B/L and unlabored GI: Distention and ascites, persists. No tenderness Musculoskeletal:  Persistent Edema in both thighs and the feet. No tenderness Skin:   Other jaundice. Warm and dry. Neuro: Alert/Oriented Motor: 4+/5 bilateral deltoid, biceps, triceps, grip, 2/5 hip flexor knee extensor 4-/5 ankle dorsiflexor and plantar flexor Gen. no acute distress. Appears comfortable.  GU: scrotal edema/penile edema unchanged. Dried urine on bedsheet Psych: pleasant, seems to be in good spirits today again  Assessment/Plan: 1. Functional deficits secondary to Debilitation secondary to respiratory  failure/sepsis, complications from cirrhosis of liver/ascites. which require 3+ hours per day of interdisciplinary therapy in a comprehensive inpatient rehab setting. Physiatrist is providing close team supervision and 24 hour management of active medical problems listed below. Physiatrist and rehab team continue to assess barriers to discharge/monitor patient progress toward functional and medical goals. FIM: Function - Bathing Bathing activity did not occur: Refused Position: Wheelchair/chair at sink Body parts bathed by patient: Right arm, Left arm, Chest, Abdomen, Right upper leg, Left upper leg, Front perineal area, Buttocks Body parts bathed by helper: Right lower leg, Left lower leg Assist Level: Touching or steadying assistance(Pt > 75%)  Function- Upper Body Dressing/Undressing What is the patient wearing?: Hospital gown Pull over shirt/dress - Perfomed by patient: Thread/unthread right sleeve, Thread/unthread left sleeve, Put head through opening, Pull shirt over trunk Pull over shirt/dress - Perfomed by helper: Pull shirt over trunk Assist Level: Set up Function - Lower Body Dressing/Undressing What is the patient wearing?: Hospital Gown Position: Wheelchair/chair at sink Underwear - Performed by helper: Thread/unthread right underwear leg, Thread/unthread left underwear leg, Pull underwear up/down Pants- Performed by patient: Pull pants up/down, Thread/unthread left pants leg, Thread/unthread right pants leg Pants- Performed by helper: Pull pants up/down, Thread/unthread left pants leg, Thread/unthread right pants leg Non-skid slipper socks- Performed by helper: Don/doff right sock, Don/doff left sock Shoes - Performed by helper: Don/doff right shoe, Don/doff left shoe, Fasten right, Fasten left Assist for footwear: Dependant Assist for lower body dressing: Touching or steadying assistance (Pt > 75%)  Function - Toileting Toileting activity did not occur: N/A Toileting steps  completed by patient: Adjust clothing prior to toileting, Performs perineal hygiene, Adjust clothing after toileting Toileting steps completed by helper: Performs perineal hygiene Toileting Assistive Devices: Grab bar or rail Assist level: Supervision or verbal cues  Function - Air cabin crew transfer activity did not occur: N/A Toilet transfer assistive device: Elevated toilet seat/BSC over toilet, Walker Assist level to toilet: Supervision or verbal cues Assist level from toilet: Supervision or verbal cues  Function - Chair/bed transfer Chair/bed transfer activity did not occur: N/A Chair/bed transfer method: Ambulatory, Stand pivot Chair/bed transfer assist level: Supervision or verbal cues Chair/bed transfer assistive device: Armrests, Walker Chair/bed transfer details: Verbal cues for technique, Verbal cues for precautions/safety, Verbal cues for safe use of DME/AE  Function - Locomotion: Wheelchair Will patient use wheelchair at discharge?: Yes Type: Manual Max wheelchair distance: 200 Assist Level: Supervision or verbal cues Assist Level: Supervision or verbal cues Wheel 150 feet activity did not occur: Safety/medical concerns Assist Level: Supervision or verbal cues Turns around,maneuvers to table,bed, and toilet,negotiates 3% grade,maneuvers on rugs and over doorsills: No Function - Locomotion: Ambulation Assistive device: Walker-rolling Max distance: 175 Assist level: Supervision or verbal cues Assist level: Supervision  or verbal cues Assist level: Supervision or verbal cues Walk 150 feet activity did not occur: Safety/medical concerns Assist level: Supervision or verbal cues Assist level: Moderate assist (Pt 50 - 74%)  Function - Comprehension Comprehension: Auditory Comprehension assist level: Understands complex 90% of the time/cues 10% of the time  Function - Expression Expression: Verbal Expression assist level: Expresses basic needs/ideas: With no  assist  Function - Social Interaction Social Interaction assist level: Interacts appropriately with others with medication or extra time (anti-anxiety, antidepressant).  Function - Problem Solving Problem solving assist level: Solves basic 75 - 89% of the time/requires cueing 10 - 24% of the time  Function - Memory Memory assist level: Recognizes or recalls 50 - 74% of the time/requires cueing 25 - 49% of the time Patient normally able to recall (first 3 days only): Current season, Location of own room, Staff names and faces, That he or she is in a hospital    Medical Problem List and Plan: 1.  Debilitation secondary to respiratory failure/sepsis, complications from cirrhosis of liver/ascites. Status post paracentesis 11/02/2015             -bedrest to bathroom only  -appreciate Palliative care assistance. Pt remains determined to go home. Agreeable to hospice.  -home with home hospice tomorrow or Tuesday (I would prefer tomorrow given events of weekend---no benefit from continuing another day on rehab. 2.  DVT Prophylaxis/Anticoagulation: SCDs. Monitor for any signs of DVT 3. Pain Management: oxycodone and MS IR IV for pain. Controlled at present 4. Anemia/thrombocytopenia question DIC related to cirrhosis. Status post transfused multiple units.  -hgb up to 4.8 this morning. Patient appears comfortable at moment. No blood transfusion  -no outward signs of bleeding  -CT of belly shows hemoperitoneum/increased ascites-----no further intervention appropriate at this point CBC Latest Ref Rng 11/29/2015 11/28/2015 11/28/2015  WBC 4.0 - 10.5 K/uL - 14.1(H) DUPLICATE REQUEST  Hemoglobin 13.0 - 17.0 g/dL 4.8(LL) 4.6(LL) DUPLICATE REQUEST  Hematocrit 39.0 - 52.0 % 14.2(L) 14.1(L) DUPLICATE REQUEST  Platelets 150 - 828 K/uL - 003 DUPLICATE REQUEST    5. Neuropsych: This patient is capable of making decisions on his own behalf. 6. Skin/Wound Care: Routine skin checks 7.  Fluids/Electrolytes/Nutrition: encourage po as possible. Appetite better with rx of ascites 8. Hypertension. No current antihypertensive medication. Follow with increased mobility 9. Acute renal insufficiency. BUN/Cr geberally holding despite diuretics---reviewed labs today  BMP Latest Ref Rng 11/28/2015 11/27/2015 11/25/2015  Glucose 65 - 99 mg/dL 105(H) 103(H) 98  BUN 6 - 20 mg/dL 31(H) 25(H) 25(H)  Creatinine 0.61 - 1.24 mg/dL 2.39(H) 1.82(H) 1.62(H)  Sodium 135 - 145 mmol/L 130(L) 132(L) 132(L)  Potassium 3.5 - 5.1 mmol/L 4.0 4.0 4.0  Chloride 101 - 111 mmol/L 100(L) 102 102  CO2 22 - 32 mmol/L 17(L) 17(L) 18(L)  Calcium 8.9 - 10.3 mg/dL 8.4(L) 8.2(L) 8.0(L)    10. Sinus tachycardia with SVT versus atrial fibrillation. Amiodarone 200 mg daily.  11. Liver failure with ascites:  Now with hemoperitoneum   -paracentesis for 6L most recently  -aldactone/lasix   -no further GI intervention 12. Penile/scrotal edema----some improvement  -continue foley at home for comfort 13. Diarrhea: stool ranges from soft to loose   -continue probiotic   LOS (Days) 16 A FACE TO FACE EVALUATION WAS PERFORMED  Rekha Hobbins T 11/29/2015, 8:21 AM

## 2015-11-29 NOTE — Progress Notes (Signed)
Physical Therapy Note  Patient Details  Name: Teola Bradleyhomas Sieg MRN: [id number] Date of Birth: 03/02/1961 Today's Date: 11/29/2015  Patient missed 45 min skilled physical therapy on bed rest with low hemoglobin.    Bayard HuggerVarner, Lataja Newland A 11/29/2015, 10:52 AM

## 2015-11-30 ENCOUNTER — Inpatient Hospital Stay (HOSPITAL_COMMUNITY): Payer: Federal, State, Local not specified - PPO

## 2015-11-30 ENCOUNTER — Inpatient Hospital Stay (HOSPITAL_COMMUNITY): Payer: Federal, State, Local not specified - PPO | Admitting: Occupational Therapy

## 2015-11-30 DIAGNOSIS — R1084 Generalized abdominal pain: Secondary | ICD-10-CM | POA: Insufficient documentation

## 2015-11-30 DIAGNOSIS — R17 Unspecified jaundice: Secondary | ICD-10-CM | POA: Insufficient documentation

## 2015-11-30 DIAGNOSIS — K7290 Hepatic failure, unspecified without coma: Secondary | ICD-10-CM

## 2015-11-30 LAB — GLUCOSE, CAPILLARY
Glucose-Capillary: 136 mg/dL — ABNORMAL HIGH (ref 65–99)
Glucose-Capillary: 137 mg/dL — ABNORMAL HIGH (ref 65–99)

## 2015-11-30 LAB — PROTIME-INR
INR: 2.87 — ABNORMAL HIGH (ref 0.00–1.49)
PROTHROMBIN TIME: 29.6 s — AB (ref 11.6–15.2)

## 2015-11-30 MED ORDER — LORAZEPAM 1 MG PO TABS
1.0000 mg | ORAL_TABLET | ORAL | Status: DC | PRN
Start: 2015-11-30 — End: 2015-11-30

## 2015-11-30 MED ORDER — LACTULOSE 10 GM/15ML PO SOLN: 30.0000 g | Freq: Every day | ORAL | Status: AC

## 2015-11-30 MED ORDER — OXYCODONE HCL 5 MG PO TABS: 5.0000 mg | ORAL_TABLET | Freq: Four times a day (QID) | ORAL | Status: DC | PRN

## 2015-11-30 MED ORDER — LORATADINE 10 MG PO TABS: 10.0000 mg | ORAL_TABLET | Freq: Every day | ORAL | Status: AC

## 2015-11-30 MED ORDER — MORPHINE SULFATE (CONCENTRATE) 10 MG/0.5ML PO SOLN: 5.0000 mg | ORAL | Status: AC | PRN

## 2015-11-30 MED ORDER — FOLIC ACID 1 MG PO TABS: 1.0000 mg | ORAL_TABLET | Freq: Every day | ORAL | Status: AC

## 2015-11-30 MED ORDER — OMEPRAZOLE 20 MG PO CPDR: 20.0000 mg | DELAYED_RELEASE_CAPSULE | Freq: Every day | ORAL | Status: AC

## 2015-11-30 MED ORDER — AMIODARONE HCL 200 MG PO TABS: 200.0000 mg | ORAL_TABLET | Freq: Every day | ORAL | Status: AC

## 2015-11-30 MED ORDER — LORAZEPAM 1 MG PO TABS: 1.0000 mg | ORAL_TABLET | ORAL | Status: AC | PRN

## 2015-11-30 MED ORDER — MORPHINE SULFATE (CONCENTRATE) 10 MG/0.5ML PO SOLN
5.0000 mg | ORAL | Status: DC | PRN
  Administered 2015-11-30: 5 mg via ORAL
  Filled 2015-11-30: qty 0.5

## 2015-11-30 MED ORDER — THIAMINE HCL 100 MG PO TABS: 100.0000 mg | ORAL_TABLET | Freq: Every day | ORAL | Status: AC

## 2015-11-30 NOTE — Progress Notes (Signed)
Daily Progress Note   Patient Name: [name]       Date: 11/30/2015 DOB: 07/07/1961  Age: 55 y.o. MRN#: [id number] Attending Physician: Ranelle OysterZachary T Swartz, MD Primary Care Physician: Marisue BrooklynSaguier, Edward, PA-C Admit Date: 11/13/2015  Reason for Consultation/Follow-up: Establishing goals of care, Hospice Evaluation and Psychosocial/spiritual support  Subjective: - plan is for home with hospice today.  Continued conversation with patient regarding his GOC anticipatory care needs.   I also spoke with brother/main support person/HPOA Benjaman LobeLincoln Broadax by telephone. All understand the limited prognosis.  Patient refused possibility of [facility]/hospice facility   Patient desires full comfort path.  No further labs, transfusions, avoid re hospitalizations.    Length of Stay: 17  Current Medications: Scheduled Meds:  . amiodarone  200 mg Oral Daily  . folic acid  1 mg Oral Daily  . insulin aspart  0-9 Units Subcutaneous TID WC  . lactulose  30 g Oral Daily  . loratadine  10 mg Oral Daily  . nystatin   Topical BID  . oxymetazoline  1 spray Each Nare BID  . pantoprazole  40 mg Oral Daily  . thiamine  100 mg Oral Daily    Continuous Infusions:    PRN Meds: albuterol, lidocaine, morphine injection, ondansetron **OR** ondansetron (ZOFRAN) IV, oxyCODONE  Physical Exam  Constitutional: He appears cachectic. He is cooperative. He appears ill.  Cardiovascular: Normal rate, regular rhythm and normal heart sounds.   Pulmonary/Chest: He has decreased breath sounds in the right lower field and the left lower field.  Abdominal: He exhibits distension.  Skin: Skin is warm and dry.  -jaundice  Psychiatric:  - alert and oriented and clear/ with insight about his decision to "go home and die"         Vital Signs: BP 126/81 mmHg  Pulse 94  Temp(Src) 99.1 F (37.3 C) (Oral)  Resp 20  Ht 6' (1.829 m)  Wt 93.2 kg (205 lb 7.5 oz)  BMI 27.86 kg/m2  SpO2 99% SpO2: SpO2: 99 % O2 Device: O2 Device: Not Delivered O2 Flow Rate:    Intake/output summary:  Intake/Output Summary (Last 24 hours) at 11/30/15 1016 Last data filed at 11/30/15 1001  Gross per 24 hour  Intake    360 ml  Output   2500 ml  Net  -2140 ml  LBM: Last BM Date: 11/26/15 Baseline Weight: Weight: 107.502 kg (237 lb) Most recent weight: Weight: 93.2 kg (205 lb 7.5 oz)       Palliative Assessment/Data:    Flowsheet Rows        Most Recent Value   Intake Tab    Referral Department  -- [CIR]   Unit at Time of Referral  Other (Comment) [CIR]   Palliative Care Primary Diagnosis  Other (Comment) [GI]   Date Notified  11/26/15   Palliative Care Type  Return patient Palliative Care   Reason for referral  Clarify Goals of Care   Date of Admission  11/13/15   Date first seen by Palliative Care  11/26/15   # of days Palliative referral response time  0 Day(s)   # of days IP prior to Palliative referral  13   Clinical Assessment    Psychosocial & Spiritual Assessment    Palliative Care Outcomes       Patient Active Problem List   Diagnosis Date Noted  . DNR (do not resuscitate) 11/26/2015  . Adjustment disorder with depressed mood   . Portal hypertension (HCC)   . Loose stools   . Ascites   . Debilitated 11/13/2015  . Encephalopathy, hepatic (HCC)   . Goals of care, counseling/discussion   . Counseling regarding goals of care   . Decompensated liver disease (HCC)   . Anasarca   . Bleeding   . Palliative care encounter   . Absolute anemia   . Bilateral lower extremity edema   . Acute respiratory failure with hypoxia (HCC)   . AKI (acute kidney injury) (HCC)   . Alcoholic cirrhosis of liver with ascites (HCC)   . Altered mental status   . Elevated INR   . Lactic acidosis   . Subacute liver  failure without hepatic coma   . Protein-calorie malnutrition, severe (HCC) 11/02/2015  . Shock (HCC) 10/31/2015  . Anemia 10/31/2015  . Cirrhosis (HCC) 10/31/2015  . Acute respiratory failure (HCC) 10/31/2015  . Pressure ulcer 10/31/2015  . Thrombocytopenia (HCC) 11/28/2014  . Allergic rhinitis 11/14/2014  . Arthritis 11/14/2014  . Cataracts, bilateral 11/14/2014  . Sleep apnea 11/14/2014  . HTN (hypertension) 11/14/2014  . Neuropathy (HCC) 11/14/2014  . Achilles tendon tear 11/14/2014  . Pedal edema 11/14/2014  . Renal failure 11/12/2013  . Syncope 11/12/2013    Palliative Care Assessment & Plan    Assessment:   ESLD, patient wishes to focus on comfort, no further life prolonging measure.  Discharge home with hospice services     Goals of Care and Additional Recommendations:  Limitations on Scope of Treatment: Avoid Hospitalization and Full Comfort Care  Code Status:    Code Status Orders        Start     Ordered   11/13/15 1754  Do not attempt resuscitation (DNR)   Continuous    Question Answer Comment  In the event of cardiac or respiratory ARREST Do not call a "code blue"   In the event of cardiac or respiratory ARREST Do not perform Intubation, CPR, defibrillation or ACLS   In the event of cardiac or respiratory ARREST Use medication by any route, position, wound care, and other measures to relive pain and suffering. May use oxygen, suction and manual treatment of airway obstruction as needed for comfort.      11/13/15 1753    Code Status History    Date Active Date Inactive Code Status Order ID Comments User Context  11/13/2015  5:53 PM  DNR [id number]  Charlton Amor, PA-C Inpatient   11/04/2015  1:01 PM 11/13/2015  5:53 PM DNR [id number]  Gust Rung, DO Inpatient   10/31/2015  9:43 PM 11/04/2015  1:01 PM Partial Code [id number]  Oretha Milch, MD Inpatient   10/31/2015  8:37 PM 10/31/2015  9:43 PM Full Code [id number]  Leslye Peer, MD Inpatient    11/12/2013 11:17 PM 11/14/2013  3:18 PM Full Code [id number]  Jacquelin Hawking, MD Inpatient    Advance Directive Documentation        Most Recent Value   Type of Advance Directive  Healthcare Power of Attorney   Pre-existing out of facility DNR order (yellow form or pink MOST form)     "MOST" Form in Place?         Prognosis:   < 2 weeks  Discharge Planning:  Home with Hospice  Care plan was discussed with Amada Jupiter SW  Thank you for allowing the Palliative Medicine Team to assist in the care of this patient.   Time In: 0930 Time Out: 1015 Total Time 45 min Prolonged Time Billed  no       Greater than 50%  of this time was spent counseling and coordinating care related to the above assessment and plan.  Lorinda Creed, NP  Please contact Palliative Medicine Team phone at [phone] for questions and concerns.

## 2015-11-30 NOTE — Progress Notes (Signed)
Physical Therapy Discharge Summary  Patient Details  Name: [name] MRN: [id number] Date of Birth: 12/19/1960  Patient has met 10 of 10 long term goals as of 11/27/15, but pt with medical decline over the weekend and placed on bed rest  (due to low Hg) with plan to d/c home with hospice. Patient to discharge at w/c and ambulatory level Supervision using RW for transfers and gait to tolerance. Patient's care partner unavailable to provide the necessary physical assistance at discharge, therefore plan for hospice care at home.  Reasons goals not met: goals were met as of 5/12 - bed rest since then due to medical issues.  Recommendation:  Pt will be receiving hospice services upon discharge.   Equipment: 18x18 w/c with cushion and RW  Reasons for discharge: treatment goals met and discharge from hospital  Patient/family agrees with progress made and goals achieved: Yes  PT Discharge Precautions/Restrictions Restrictions Weight Bearing Restrictions: No Vision/Perception   No change from baseline  Cognition Overall Cognitive Status: Within Functional Limits for tasks assessed Arousal/Alertness: Awake/alert Orientation Level: Oriented X4 Attention: Selective Selective Attention: Appears intact Memory: Impaired Memory Impairment: Decreased short term memory Awareness: Appears intact Problem Solving: Appears intact Sensation Sensation Light Touch: Appears Intact Stereognosis: Not tested Hot/Cold: Appears Intact Proprioception: Appears Intact Coordination Gross Motor Movements are Fluid and Coordinated: No Fine Motor Movements are Fluid and Coordinated: No Motor  Motor Motor: Within Functional Limits Motor - Discharge Observations: generalized weakness and deconditioning, limited by ongoing RLE and abdominal pain Trunk/Postural Assessment  Cervical Assessment Cervical Assessment: Within Functional Limits Thoracic Assessment Thoracic Assessment: Within Functional  Limits Lumbar Assessment Lumbar Assessment: Exceptions to [facility] Lumbar AROM Overall Lumbar AROM Comments: posterior pelvic tilt in sitting  Extremity Assessment  RUE Assessment RUE Assessment: Within Functional Limits LUE Assessment LUE Assessment: Within Functional Limits     See Function Navigator for Current Functional Status.  Lars Masson, PT, DPT  11/30/2015, 12:14 PM

## 2015-11-30 NOTE — Progress Notes (Signed)
Occupational Therapy Discharge Summary  Patient Details  Name: [name] MRN: [id number] Date of Birth: 08/14/1960   Patient has met 9 of 11 long term goals due to improved activity tolerance, improved balance and ability to compensate for deficits.  Pt made steady progress during admission up until 5/13 at which time pt experienced medical complications requiring bed rest.  Pt met 9/11 LTGs as of Friday, 5/12. Pt did not achieve home making and laundry goals secondary to medical prognosis and pt's desire to not address these goals. Pt discharging home today with Hospice services continuing care. Pt's family have not participated in therapy. Patient to discharge at overall min- Mod Assist level.  Patient's care partner is independent to provide the necessary physical assistance at discharge.    Reasons goals not met: medical complications, severe weakness, pt now on Hospice care  Recommendation:  No further OT services recommended at this time.  Equipment: No equipment provided Hospice to provide necessary equipment  Reasons for discharge: treatment goals met and discharge from hospital  Patient/family agrees with progress made and goals achieved: Yes  OT Discharge   Vision/Perception  Vision- History Baseline Vision/History: Wears glasses Wears Glasses: Reading only Patient Visual Report: No change from baseline Vision- Assessment Vision Assessment?: No apparent visual deficits  Cognition Overall Cognitive Status: Within Functional Limits for tasks assessed Arousal/Alertness: Awake/alert Orientation Level: Oriented X4 Attention: Selective Selective Attention: Appears intact Memory: Impaired Memory Impairment: Decreased short term memory Awareness: Appears intact Problem Solving: Appears intact Sensation Sensation Light Touch: Appears Intact Stereognosis: Not tested Hot/Cold: Appears Intact Proprioception: Appears Intact Coordination Gross Motor Movements are Fluid  and Coordinated: No Fine Motor Movements are Fluid and Coordinated: No Trunk/Postural Assessment  Cervical Assessment Cervical Assessment: Within Functional Limits Thoracic Assessment Thoracic Assessment: Within Functional Limits Lumbar Assessment Lumbar Assessment: Exceptions to [facility] Lumbar AROM Overall Lumbar AROM Comments: posterior pelvic tilt in sitting    Extremity/Trunk Assessment RUE Assessment RUE Assessment: Within Functional Limits LUE Assessment LUE Assessment: Within Functional Limits   See Function Navigator for Current Functional Status.  Oluwadarasimi, [facility] 11/30/2015, 2:01 PM

## 2015-11-30 NOTE — Discharge Summary (Signed)
Discharge summary job # [id number]

## 2015-11-30 NOTE — Progress Notes (Signed)
Social Work Patient ID: Nihar Klus, male   DOB: [birth year], 55 y.o.   MRN: [id number]   Alerted by Dr. Naaman Plummer this morning that he is clearing pt for d/c today if all arrangements could be put in place with Hospice at home.  Wadie Lessen, NP with Palliative Care met with pt and confirms his desire to go home today with Hospice.  She and I have also spoken with pt's brother, [name], who is in agreement.  He plans to be here at the hospital at 3:00 to go over d/c instructions and to pick up DME to take to the home.  Pt is agreeable to travel home via ambulance due to medical needs - set for 4:00 pick up.  [facility] has taken pt's case and hospital liaison has met with pt today as well.  All arrangements in place and pt looking forward to going home.  Chris Narasimhan, LCSW

## 2015-11-30 NOTE — Progress Notes (Signed)
Physical Therapy Note  Patient Details  Name: [name] MRN: [id number] Date of Birth: 07/05/1961 Today's Date: 11/30/2015    Pt continues to be on medical hold and awaiting finalizing d/c plan. Pt missed 45 min of skilled PT. Will continue to follow up as appropriate.  Spoke with patient briefly about any questions in regards to d/c or equipment delivered yesterday. He denies any concerns and states he is ready to leave whenever he is cleared.     Karolee StampsGray, Wyonia Fontanella Darrol PokeBrescia  Muskan Bolla B. Taylorann Tkach, PT, DPT  11/30/2015, 9:25 AM

## 2015-11-30 NOTE — Progress Notes (Addendum)
Subjective/Complaints: Feeling comfortable this morning. In good spirits.    ROS: Denies CP, SOB, palpitations, nausea, vomiting. Continued edema.   Objective: Vital Signs: Blood pressure 126/81, pulse 94, temperature 99.1 F (37.3 C), temperature source Oral, resp. rate 20, height 6' (1.829 m), weight 93.2 kg (205 lb 7.5 oz), SpO2 99 %. Ct Abdomen Pelvis Wo Contrast  11/28/2015  ADDENDUM REPORT: 11/28/2015 17:32 ADDENDUM: These results were called by telephone at the time of interpretation on 11/28/2015 at 5:32 pm to Dr. Alger Simons , who verbally acknowledged these results. Electronically Signed   By: Franchot Gallo M.D.   On: 11/28/2015 17:32  11/28/2015  CLINICAL DATA:  Low hemoglobin. Recent paracentesis 11/26/2015. Cirrhosis with ascites. EXAM: CT ABDOMEN AND PELVIS WITHOUT CONTRAST TECHNIQUE: Multidetector CT imaging of the abdomen and pelvis was performed following the standard protocol without IV contrast. COMPARISON:  CT abdomen pelvis 11/06/2015 FINDINGS: Lower chest:  Lung bases clear.  Elevated right hemidiaphragm Hepatobiliary: Cirrhotic liver. Small irregular liver without mass lesion. Large amount of ascites in the abdomen has increased since the prior study. There is fluid in all 4 quadrants which has progressed. The fluid in the pelvis shows slightly diffuse increased density suggesting possible hemorrhage. There is layering high density material in the left pelvis which is consistent with clot. This was not present previously. This would account for the patient's drop in hemoglobin. High density within the gallbladder is unchanged most consistent with small stones. Pancreas: Negative Spleen: Normal splenic size Adrenals/Urinary Tract: Negative for renal mass or obstruction. No renal calculi. Foley catheter in the bladder which is decompressed. Stomach/Bowel: Colonic dilatation compatible with ileus. No bowel thickening. Small bowel nondilated. Gas in the rectum. Vascular/Lymphatic:  Negative for aortic aneurysm. No mass or lymphadenopathy. Reproductive: Negative Other: Negative Musculoskeletal: Negative IMPRESSION: Cirrhotic liver. Large amount of ascites has progressed since 11/06/2015. The patient had recent paracentesis on 11/26/2015. There is blood clot in the ascitic fluid in the pelvis compatible with hemoperitoneum and ascites. Colonic ileus. Electronically Signed: By: Franchot Gallo M.D. On: 11/28/2015 17:22   Results for orders placed or performed during the hospital encounter of 11/13/15 (from the past 72 hour(s))  Glucose, capillary     Status: Abnormal   Collection Time: 11/27/15 11:53 AM  Result Value Ref Range   Glucose-Capillary 128 (H) 65 - 99 mg/dL   Comment 1 Notify RN   Glucose, capillary     Status: Abnormal   Collection Time: 11/27/15  4:40 PM  Result Value Ref Range   Glucose-Capillary 121 (H) 65 - 99 mg/dL  Glucose, capillary     Status: Abnormal   Collection Time: 11/27/15  8:44 PM  Result Value Ref Range   Glucose-Capillary 124 (H) 65 - 99 mg/dL   Comment 1 Notify RN   Basic metabolic panel     Status: Abnormal   Collection Time: 11/28/15  7:02 AM  Result Value Ref Range   Sodium 130 (L) 135 - 145 mmol/L   Potassium 4.0 3.5 - 5.1 mmol/L   Chloride 100 (L) 101 - 111 mmol/L   CO2 17 (L) 22 - 32 mmol/L   Glucose, Bld 105 (H) 65 - 99 mg/dL   BUN 31 (H) 6 - 20 mg/dL   Creatinine, Ser 2.39 (H) 0.61 - 1.24 mg/dL   Calcium 8.4 (L) 8.9 - 10.3 mg/dL   GFR calc non Af Amer 29 (L) >60 mL/min   GFR calc Af Amer 34 (L) >60 mL/min    Comment: (NOTE)  The eGFR has been calculated using the CKD EPI equation. This calculation has not been validated in all clinical situations. eGFR's persistently <60 mL/min signify possible Chronic Kidney Disease.    Anion gap 13 5 - 15  CBC     Status: Abnormal   Collection Time: 11/28/15  7:02 AM  Result Value Ref Range   WBC 15.1 (H) 4.0 - 10.5 K/uL   RBC 1.27 (L) 4.22 - 5.81 MIL/uL   Hemoglobin 4.5 (LL) 13.0 -  17.0 g/dL    Comment: REPEATED TO VERIFY CRITICAL RESULT CALLED TO, READ BACK BY AND VERIFIED WITH: MARYANN BABER RN AT [phone] 11/28/15 BY WOOLLENK    HCT 13.4 (L) 39.0 - 52.0 %   MCV 105.5 (H) 78.0 - 100.0 fL   MCH 35.4 (H) 26.0 - 34.0 pg   MCHC 33.6 30.0 - 36.0 g/dL   RDW 25.5 (H) 11.5 - 15.5 %   Platelets 182 150 - 400 K/uL  CBC with Differential/Platelet     Status: Abnormal   Collection Time: 11/28/15  7:02 AM  Result Value Ref Range   WBC DUPLICATE REQUEST 4.0 - 46.5 K/uL   RBC DUPLICATE REQUEST 0.35 - 5.81 MIL/uL   Hemoglobin DUPLICATE REQUEST 46.5 - 68.1 g/dL   HCT DUPLICATE REQUEST 27.5 - 17.0 %   MCV DUPLICATE REQUEST 01.7 - 494.4 fL   MCH DUPLICATE REQUEST 96.7 - 59.1 pg   MCHC DUPLICATE REQUEST 63.8 - 46.6 g/dL   RDW DUPLICATE REQUEST 59.9 - 15.5 %   Platelets DUPLICATE REQUEST 357 - 400 K/uL   Neutrophils Relative % 70 %   Neutro Abs 10.0 (H) 1.7 - 7.7 K/uL   Lymphocytes Relative 6 %   Lymphs Abs 0.9 0.7 - 4.0 K/uL   Monocytes Relative 18 %   Monocytes Absolute 2.6 (H) 0.1 - 1.0 K/uL   Eosinophils Relative 5 %   Eosinophils Absolute 0.8 (H) 0.0 - 0.7 K/uL   Basophils Relative 1 %   Basophils Absolute 0.1 0.0 - 0.1 K/uL  Glucose, capillary     Status: Abnormal   Collection Time: 11/28/15  7:28 AM  Result Value Ref Range   Glucose-Capillary 105 (H) 65 - 99 mg/dL   Comment 1 Notify RN   Glucose, capillary     Status: Abnormal   Collection Time: 11/28/15 11:54 AM  Result Value Ref Range   Glucose-Capillary 118 (H) 65 - 99 mg/dL   Comment 1 Notify RN   CBC with Differential/Platelet     Status: Abnormal   Collection Time: 11/28/15 12:39 PM  Result Value Ref Range   WBC 14.1 (H) 4.0 - 10.5 K/uL   RBC 1.33 (L) 4.22 - 5.81 MIL/uL   Hemoglobin 4.6 (LL) 13.0 - 17.0 g/dL    Comment: REPEATED TO VERIFY CRITICAL RESULT CALLED TO, READ BACK BY AND VERIFIED WITH: ROYAL,A RN 11/28/15 1255 WOOTEN,K    HCT 14.1 (L) 39.0 - 52.0 %   MCV 106.0 (H) 78.0 - 100.0 fL   MCH  34.6 (H) 26.0 - 34.0 pg   MCHC 32.6 30.0 - 36.0 g/dL   Platelets 188 150 - 400 K/uL    Comment: PLATELET COUNT CONFIRMED BY SMEAR   Neutrophils Relative % 70 %   Lymphocytes Relative 6 %   Monocytes Relative 18 %   Eosinophils Relative 5 %   Basophils Relative 1 %   Neutro Abs 10.0 (H) 1.7 - 7.7 K/uL   Lymphs Abs 0.8 0.7 - 4.0 K/uL  Monocytes Absolute 2.5 (H) 0.1 - 1.0 K/uL   Eosinophils Absolute 0.7 0.0 - 0.7 K/uL   Basophils Absolute 0.1 0.0 - 0.1 K/uL   RBC Morphology MARKED POLYCHROMASIA     Comment: SCHISTOCYTES NOTED ON SMEAR ACANTHOCYTES TARGET CELLS   Glucose, capillary     Status: Abnormal   Collection Time: 11/28/15  4:57 PM  Result Value Ref Range   Glucose-Capillary 109 (H) 65 - 99 mg/dL   Comment 1 Notify RN   Glucose, capillary     Status: Abnormal   Collection Time: 11/28/15  9:02 PM  Result Value Ref Range   Glucose-Capillary 112 (H) 65 - 99 mg/dL   Comment 1 Notify RN   Hemoglobin and hematocrit, blood     Status: Abnormal   Collection Time: 11/29/15  6:48 AM  Result Value Ref Range   Hemoglobin 4.8 (LL) 13.0 - 17.0 g/dL    Comment: REPEATED TO VERIFY CRITICAL VALUE NOTED.  VALUE IS CONSISTENT WITH PREVIOUSLY REPORTED AND CALLED VALUE.    HCT 14.2 (L) 39.0 - 52.0 %  Glucose, capillary     Status: None   Collection Time: 11/29/15  7:07 AM  Result Value Ref Range   Glucose-Capillary 86 65 - 99 mg/dL   Comment 1 Notify RN   Glucose, capillary     Status: Abnormal   Collection Time: 11/29/15 11:35 AM  Result Value Ref Range   Glucose-Capillary 117 (H) 65 - 99 mg/dL  Glucose, capillary     Status: Abnormal   Collection Time: 11/29/15  4:41 PM  Result Value Ref Range   Glucose-Capillary 108 (H) 65 - 99 mg/dL   Comment 1 Notify RN   Glucose, capillary     Status: Abnormal   Collection Time: 11/29/15  8:35 PM  Result Value Ref Range   Glucose-Capillary 105 (H) 65 - 99 mg/dL   Comment 1 Notify RN   Glucose, capillary     Status: Abnormal   Collection  Time: 11/30/15  6:47 AM  Result Value Ref Range   Glucose-Capillary 136 (H) 65 - 99 mg/dL   Comment 1 Notify RN   Protime-INR     Status: Abnormal   Collection Time: 11/30/15  6:51 AM  Result Value Ref Range   Prothrombin Time 29.6 (H) 11.6 - 15.2 seconds   INR 2.87 (H) 0.00 - 1.49     HEENT: Normocephalic. icteric Cardio: Regular rate. Regular rhythm. no murmurs. HR in 70's. Resp: CTA B/L and unlabored GI: Distention and ascites actually appears less. No tenderness Musculoskeletal:  Persistent Edema in both thighs and the feet. No tenderness Skin:   Other jaundice. Warm and dry. Neuro: Alert/Oriented Motor: 4+/5 bilateral deltoid, biceps, triceps, grip, 2/5 hip flexor knee extensor 4-/5 ankle dorsiflexor and plantar flexor Gen. no acute distress. Appears comfortable.  GU: scrotal edema/penile edema decreased Psych: pleasant, seems to be in good spirits today again  Assessment/Plan: 1. Functional deficits secondary to Debilitation secondary to respiratory failure/sepsis, complications from cirrhosis of liver/ascites.  Physiatrist is providing close team supervision and 24 hour management of active medical problems listed below. Physiatrist and rehab team continue to assess barriers to discharge/monitor patient progress toward functional and medical goals. FIM: Function - Bathing Bathing activity did not occur: Refused Position: Wheelchair/chair at sink Body parts bathed by patient: Right arm, Left arm, Chest, Abdomen, Right upper leg, Left upper leg, Front perineal area, Buttocks Body parts bathed by helper: Right lower leg, Left lower leg Assist Level: Touching or steadying  assistance(Pt > 75%)  Function- Upper Body Dressing/Undressing What is the patient wearing?: [place] over shirt/dress - Perfomed by patient: Thread/unthread right sleeve, Thread/unthread left sleeve, Put head through opening, Pull shirt over trunk Pull over shirt/dress - Perfomed by helper: Pull  shirt over trunk Assist Level: Set up Function - Lower Body Dressing/Undressing What is the patient wearing?: Hospital Gown Position: Wheelchair/chair at sink Underwear - Performed by helper: Thread/unthread right underwear leg, Thread/unthread left underwear leg, Pull underwear up/down Pants- Performed by patient: Pull pants up/down, Thread/unthread left pants leg, Thread/unthread right pants leg Pants- Performed by helper: Pull pants up/down, Thread/unthread left pants leg, Thread/unthread right pants leg Non-skid slipper socks- Performed by helper: Don/doff right sock, Don/doff left sock Shoes - Performed by helper: Don/doff right shoe, Don/doff left shoe, Fasten right, Fasten left Assist for footwear: Dependant Assist for lower body dressing: Touching or steadying assistance (Pt > 75%)  Function - Toileting Toileting activity did not occur: N/A Toileting steps completed by patient: Adjust clothing prior to toileting, Performs perineal hygiene, Adjust clothing after toileting Toileting steps completed by helper: Performs perineal hygiene Toileting Assistive Devices: Grab bar or rail Assist level: Supervision or verbal cues  Function - Air cabin crew transfer activity did not occur: N/A Toilet transfer assistive device: Elevated toilet seat/BSC over toilet, Walker Assist level to toilet: Supervision or verbal cues Assist level from toilet: Supervision or verbal cues  Function - Chair/bed transfer Chair/bed transfer activity did not occur: N/A Chair/bed transfer method: Ambulatory, Stand pivot Chair/bed transfer assist level: Supervision or verbal cues Chair/bed transfer assistive device: Armrests, Walker Chair/bed transfer details: Verbal cues for technique, Verbal cues for precautions/safety, Verbal cues for safe use of DME/AE  Function - Locomotion: Wheelchair Will patient use wheelchair at discharge?: Yes Type: Manual Max wheelchair distance: 200 Assist Level:  Supervision or verbal cues Assist Level: Supervision or verbal cues Wheel 150 feet activity did not occur: Safety/medical concerns Assist Level: Supervision or verbal cues Turns around,maneuvers to table,bed, and toilet,negotiates 3% grade,maneuvers on rugs and over doorsills: No Function - Locomotion: Ambulation Assistive device: Walker-rolling Max distance: 175 Assist level: Supervision or verbal cues Assist level: Supervision or verbal cues Assist level: Supervision or verbal cues Walk 150 feet activity did not occur: Safety/medical concerns Assist level: Supervision or verbal cues Assist level: Moderate assist (Pt 50 - 74%)  Function - Comprehension Comprehension: Auditory Comprehension assist level: Understands complex 90% of the time/cues 10% of the time  Function - Expression Expression: Verbal Expression assist level: Expresses basic 90% of the time/requires cueing < 10% of the time.  Function - Social Interaction Social Interaction assist level: Interacts appropriately 90% of the time - Needs monitoring or encouragement for participation or interaction.  Function - Problem Solving Problem solving assist level: Solves basic 75 - 89% of the time/requires cueing 10 - 24% of the time  Function - Memory Memory assist level: Recognizes or recalls 50 - 74% of the time/requires cueing 25 - 49% of the time Patient normally able to recall (first 3 days only): Current season, Location of own room, Staff names and faces, That he or she is in a hospital    Medical Problem List and Plan: 1.  Debilitation secondary to respiratory failure/sepsis, complications from cirrhosis of liver/ascites. Status post paracentesis 11/02/2015             -bedrest to bathroom only  -appreciate Palliative care assistance. Pt remains determined to go home. Agreeable to hospice.  -home with home  hospice today if possible or Tuesday (preferrably today given events of weekend---no benefit from continuing  another day on rehab. 2.  DVT Prophylaxis/Anticoagulation: SCDs. Monitor for any signs of DVT 3. Pain Management: oxycodone and MS IR IV for pain. Controlled at present 4. Anemia/thrombocytopenia question DIC related to cirrhosis. Status post transfused multiple units.  -hgb up to 4.8 yesterday. No recheck today.  Patient appears comfortable at moment.   -no outward signs of bleeding  -CT of belly shows hemoperitoneum/increased ascites-----no further intervention appropriate at this point CBC Latest Ref Rng 11/29/2015 11/28/2015 11/28/2015  WBC 4.0 - 10.5 K/uL - 14.1(H) DUPLICATE REQUEST  Hemoglobin 13.0 - 17.0 g/dL 4.8(LL) 4.6(LL) DUPLICATE REQUEST  Hematocrit 39.0 - 52.0 % 14.2(L) 14.1(L) DUPLICATE REQUEST  Platelets 150 - 414 K/uL - 239 DUPLICATE REQUEST    5. Neuropsych: This patient is capable of making decisions on his own behalf. 6. Skin/Wound Care: Routine skin checks 7. Fluids/Electrolytes/Nutrition: encourage po as possible. Appetite better with rx of ascites 8. Hypertension. No current antihypertensive medication. Follow with increased mobility 9. Acute renal insufficiency.    BMP Latest Ref Rng 11/28/2015 11/27/2015 11/25/2015  Glucose 65 - 99 mg/dL 105(H) 103(H) 98  BUN 6 - 20 mg/dL 31(H) 25(H) 25(H)  Creatinine 0.61 - 1.24 mg/dL 2.39(H) 1.82(H) 1.62(H)  Sodium 135 - 145 mmol/L 130(L) 132(L) 132(L)  Potassium 3.5 - 5.1 mmol/L 4.0 4.0 4.0  Chloride 101 - 111 mmol/L 100(L) 102 102  CO2 22 - 32 mmol/L 17(L) 17(L) 18(L)  Calcium 8.9 - 10.3 mg/dL 8.4(L) 8.2(L) 8.0(L)    10. Sinus tachycardia with SVT versus atrial fibrillation. Amiodarone 200 mg daily.  11. Liver failure with ascites:  Now with hemoperitoneum   -paracentesis for 6L most recently  -aldactone/lasix   -no further GI intervention 12. Penile/scrotal edema----some improvement  -continue foley at home for comfort 13. Diarrhea: stool ranges from soft to loose   -continue probiotic   LOS (Days) 17 A FACE TO FACE  EVALUATION WAS PERFORMED  Marv Alfrey T 11/30/2015, 8:46 AM

## 2015-11-30 NOTE — Progress Notes (Signed)
Occupational Therapy Note  Patient Details  Name: [name] MRN: [id number] Date of Birth: 02/09/1961  Today's Date: 11/30/2015 OT Missed Time: 90 Minutes Missed Time Reason: Patient on bedrest  Per orders, pt on bedrest with bathroom privileges only. Patients hgb=4.8 and INR=2.87.   Edwin CapPatricia Kale Rondeau , MS, OTR/L, CLT  Pager: [phone]  11/30/2015, 12:54 PM

## 2015-11-30 NOTE — Progress Notes (Signed)
Notified by Halford ChessmanLucy, CMRN of family request [facility]. Chart and patient information currently under review to confirm hospice eligibility.   Spoke with patient at bedside to initiate education related to hospice philosophy, services and team approach to care. Patient verbalized understanding of the information provided. Per discussion, plan is for discharge to home by [facility] at 4pm today.   Please send signed completed DNR form home with patient.  Patient will need prescriptions for discharge comfort medications.   DME needs discussed and patient denied any needs.  He already has W/C, walker and BCS that we will be taking home.  His brother plans to pick up this equipment at 3pm and bring his house key, so he can get into his home.  [facility] aware of the above.  Completed discharge [facility] at [phone] when final.  Please notify [facility] when patient is ready to leave unit at discharge-call [phone].   [facility] information and contact numbers [facility].    Please call with any questions.  Thank You,  Hessie KnowsStacie Wilkinson RN, BSN  [facility] Hospital Liaison  [phone]

## 2015-11-30 NOTE — Discharge Instructions (Signed)
Inpatient Rehab Discharge Instructions  Teola Bradleyhomas Lopiccolo Discharge date and time: No discharge date for patient encounter.   Activities/Precautions/ Functional Status: Activity: activity as tolerated Diet: regular diet Wound Care: none needed Functional status:  ___ No restrictions     ___ Walk up steps independently ___ 24/7 supervision/assistance   ___ Walk up steps with assistance ___ Intermittent supervision/assistance  ___ Bathe/dress independently ___ Walk with walker     _x__ Bathe/dress with assistance ___ Walk Independently    ___ Shower independently ___ Walk with assistance    ___ Shower with assistance ___ No alcohol     ___ Return to work/school ________  Special Instructions: Hospice follow-up as needed   My questions have been answered and I understand these instructions. I will adhere to these goals and the provided educational materials after my discharge from the hospital.  Patient/Caregiver Signature _______________________________ Date __________  Clinician Signature _______________________________________ Date __________  Please bring this form and your medication list with you to all your follow-up doctor's appointments.

## 2015-11-30 NOTE — Progress Notes (Signed)
Daily Rounding Note  11/30/2015, 8:41 AM  LOS: 17 days   SUBJECTIVE:       Though belly is tight and distended, pt denies abdominal pain.  No nausea.   Denies chest pain, SOB or dizziness.  Has not been out of bed today.  ~ 2 BMs yesterday. Appetite declined.     OBJECTIVE:         Vital signs in last 24 hours:    Temp:  [98.8 F (37.1 C)-99.1 F (37.3 C)] 99.1 F (37.3 C) (05/15 0504) Pulse Rate:  [92-94] 94 (05/15 0504) Resp:  [18-20] 20 (05/15 0504) BP: (116-126)/(77-81) 126/81 mmHg (05/15 0504) SpO2:  [99 %] 99 % (05/15 0504) Weight:  [93.2 kg (205 lb 7.5 oz)] 93.2 kg (205 lb 7.5 oz) (05/15 0504) Last BM Date: 11/26/15 Filed Weights   11/28/15 0516 11/29/15 0538 11/30/15 0504  Weight: 93.8 kg (206 lb 12.7 oz) 93.6 kg (206 lb 5.6 oz) 93.2 kg (205 lb 7.5 oz)   General: jaundiced.  Comfortable, alert.    Heart: RRR Chest: clear bil.  No SOB Abdomen: tense, distended.  BS hypoactive.  NT.    Extremities: large LE edema Neuro/Psych:  Oriented x 3.  Alert.  Some fine UE tremor.  No asterixis.    Intake/Output from previous day: 05/14 0701 - 05/15 0700 In: 360 [P.O.:360] Out: 1900 [Urine:1900]  Intake/Output this shift:    Lab Results:  Recent Labs  11/28/15 0702 11/28/15 1239 11/29/15 0648  WBC DUPLICATE REQUEST  15.1* 14.1*  --   HGB DUPLICATE REQUEST  4.5* 4.6* 4.8*  HCT DUPLICATE REQUEST  13.4* 14.1* 14.2*  PLT DUPLICATE REQUEST  182 188  --    BMET  Recent Labs  11/28/15 0702  NA 130*  K 4.0  CL 100*  CO2 17*  GLUCOSE 105*  BUN 31*  CREATININE 2.39*  CALCIUM 8.4*   LFT No results for input(s): PROT, ALBUMIN, AST, ALT, ALKPHOS, BILITOT, BILIDIR, IBILI in the last 72 hours. PT/INR  Recent Labs  11/30/15 0651  LABPROT 29.6*  INR 2.87*   Hepatitis Panel No results for input(s): HEPBSAG, HCVAB, HEPAIGM, HEPBIGM in the last 72 hours.  Studies/Results: Ct Abdomen Pelvis Wo  Contrast  11/28/2015  ADDENDUM REPORT: 11/28/2015 17:32 ADDENDUM: These results were called by telephone at the time of interpretation on 11/28/2015 at 5:32 pm to Dr. Faith Rogue , who verbally acknowledged these results. Electronically Signed   By: Marlan Palau M.D.   On: 11/28/2015 17:32  11/28/2015  CLINICAL DATA:  Low hemoglobin. Recent paracentesis 11/26/2015. Cirrhosis with ascites. EXAM: CT ABDOMEN AND PELVIS WITHOUT CONTRAST TECHNIQUE: Multidetector CT imaging of the abdomen and pelvis was performed following the standard protocol without IV contrast. COMPARISON:  CT abdomen pelvis 11/06/2015 FINDINGS: Lower chest:  Lung bases clear.  Elevated right hemidiaphragm Hepatobiliary: Cirrhotic liver. Small irregular liver without mass lesion. Large amount of ascites in the abdomen has increased since the prior study. There is fluid in all 4 quadrants which has progressed. The fluid in the pelvis shows slightly diffuse increased density suggesting possible hemorrhage. There is layering high density material in the left pelvis which is consistent with clot. This was not present previously. This would account for the patient's drop in hemoglobin. High density within the gallbladder is unchanged most consistent with small stones. Pancreas: Negative Spleen: Normal splenic size Adrenals/Urinary Tract: Negative for renal mass or obstruction. No renal calculi. Foley catheter in  the bladder which is decompressed. Stomach/Bowel: Colonic dilatation compatible with ileus. No bowel thickening. Small bowel nondilated. Gas in the rectum. Vascular/Lymphatic: Negative for aortic aneurysm. No mass or lymphadenopathy. Reproductive: Negative Other: Negative Musculoskeletal: Negative IMPRESSION: Cirrhotic liver. Large amount of ascites has progressed since 11/06/2015. The patient had recent paracentesis on 11/26/2015. There is blood clot in the ascitic fluid in the pelvis compatible with hemoperitoneum and ascites. Colonic  ileus. Electronically Signed: By: Marlan Palauharles  Clark M.D. On: 11/28/2015 17:22   Scheduled Meds: . amiodarone  200 mg Oral Daily  . folic acid  1 mg Oral Daily  . furosemide  60 mg Oral Daily  . insulin aspart  0-9 Units Subcutaneous TID WC  . lactulose  30 g Oral Daily  . loratadine  10 mg Oral Daily  . nystatin   Topical BID  . oxymetazoline  1 spray Each Nare BID  . pantoprazole  40 mg Oral Daily  . saccharomyces boulardii  250 mg Oral BID  . spironolactone  150 mg Oral Daily  . thiamine  100 mg Oral Daily   Continuous Infusions:  PRN Meds:.albuterol, lidocaine, morphine injection, ondansetron **OR** ondansetron (ZOFRAN) IV, oxyCODONE   ASSESMENT:    * Decompensated cirrhosis.  Ascites, s/p paracentesis x 5, latest (6 liter tap) on 5/11. No SBP on 4/19 or 4/24 taps.  Aldactone 150, Lasix 60 since 5/2.  Remains jaundiced, t bili overall tripled since admission.  Ongoing abd pain. Nausea resolved.   *  Blood loss anemia.  Hgb remain in 4s.  Received at least 13 PRBCs during recent CCM admission. No more PRBCs planned,  Hemoperitoneum post 5/11 paracentesis.   * Coagulopathy, slowly worsening. No overt bleeding.   * AKI.  ? HRS. Oliguria improved. 1.9 liters urine yesterday.   * Hyponatremia, stable.   * Protein cal malnutrition. Appetite improved.   * Encephalopathy, hepatic and metabolic. Max ammonia of just 57. Much improved MS c/w admission. Having 2 to 3 stools per day on Lactulose 30 gm daily.    PLAN   *  Plan is home with hospice.  Stopping diuretics given worsening renal function.    Wonder if we should nix the low NA diet given the circumstances?      Jennye MoccasinSarah Everlee Quakenbush  11/30/2015, 8:41 AM Pager: [phone]

## 2015-11-30 NOTE — Progress Notes (Signed)
Occupational Therapy Note  Patient Details  Name: [name] MRN: [id number] Date of Birth: 08/22/1960  Today's Date: 11/30/2015 OT Missed Time: 60 Minutes Missed Time Reason: Patient on bedrest  Pt missed 60 mins skilled OT services secondary to MD placed on bed rest (low hemoglobin)   Rich BraveLanier, Naitik Chappell 11/30/2015, 11:42 AM

## 2015-12-01 NOTE — Discharge Summary (Signed)
[name]:  [name], [name]             ACCOUNT NO.:  [id number]  MEDICAL RECORD NO.:  [id number]  LOCATION:  [id number]                        FACILITY:  [facility]  PHYSICIAN:  [name], P.A.  DATE OF BIRTH:  [birth year]  DATE OF ADMISSION:  11/13/2015 DATE OF DISCHARGE:  11/30/2015                              DISCHARGE SUMMARY   DISCHARGE DIAGNOSES: 1. Debilitation secondary to respiratory failure, sepsis     complications, cirrhosis, and liver ascites, status post multiple     paracenteses. 2. SCDs for DVT prophylaxis. 3. Pain management. 4. Anemia, thrombocytopenia. 5. Hypertension. 6. Acute renal insufficiency. 7. Sinus tachycardia with supraventricular tachycardia. 8. Liver failure with ascites. 9. Penile scrotal edema.  HISTORY OF PRESENT ILLNESS:  This is a 55 year old right-handed male, history of alcohol abuse with associated liver disease.  Per chart review, lives alone with a supportive brother.  Presented on October 31, 2015 with increasing shortness of breath.  Required intubation for airway protection.  Became obtunded.  Developed hypotension.  Stat CBC, hemoglobin 2.7.  No obvious source of bleeding.  Lactic acid 14. Creatinine 2.51 and INR 4.35.  CT of the head negative for acute changes.  Chest x-ray with extensive atelectasis, right middle lobe and lower lobes.  EKG, sinus tachycardia with SVT versus atrial fibrillation.  Placed on amiodarone.  Echocardiogram with ejection fraction of 55%.  No wall motion abnormalities.  Ultrasound of the abdomen showed cirrhosis.  No liver mass detected.  Moderate volume ascites.  Followup CT of abdomen and pelvis showed large hematoma in left upper thigh, he was transfused.  Hemoglobin stabilized at 7.4, 6 units packed red blood cells.  Underwent paracentesis with 3 L removed on November 02, 2015.  Gastroenterology as well as Hematology followup for anemia related to cirrhosis.  No current plan for EGD.  Creatinine stabilized at  1.44.  Slowly extubated.  Palliative Care consulted for goals of care.  The patient was admitted for comprehensive rehab program.  PAST MEDICAL HISTORY:  See discharge diagnoses.  SOCIAL HISTORY:  Lives alone with supportive brother.  FUNCTIONAL STATUS:  Upon admission to rehab services, min-to-mod assist for limited mobility, minimal assist sit to supine; min-mod assist activities of daily living.  PHYSICAL EXAMINATION:  VITAL SIGNS:  Blood pressure 123/86, pulse 92, temperature 98, and respirations 18. GENERAL:  This was an alert male, oriented to person, place, and time. LUNGS:  Decreased breath sounds.  Clear to auscultation. CARDIAC:  Regular rate and rhythm. ABDOMEN:  Soft and nontender.  Good bowel sounds. EXTREMITIES:  Bilateral lower extremities 2+ edema.  REHABILITATION HOSPITAL COURSE:  The patient was admitted to inpatient rehab services with therapies initiated on a 3-hour daily basis consisting of physical therapy, occupational therapy, and rehabilitation nursing.  The following issues were addressed during the patient's rehabilitation stay.  Pertaining to Mr. [name]'s debilitation secondary to respiratory failure, sepsis, complications from cirrhosis, liver ascites, Palliative Care had been consulted as well as Gastroenterology Services.  The patient with multiple paracenteses x5, latest being a 6 L tap on Nov 26, 2015.  Initially maintained on Aldactone and Lasix for decompensation.  Blood count remained stable. Coagulopathy with no overt bleeding.  Hospice as noted.  Continue  to follow with arrangements made for home.  Diuretics stopped given worsening renal function.  Multiple tox held with the patient.  He was adamant about going home.  Discussed at length with brother.  Plan was for home with hospice care.  All issues and established goals of care have been discussed with the patient.  He remained on low-dose amiodarone for supraventricular tachycardia.  No  chest pain or shortness of breath.  Chronulac 30 g daily monitoring for any increased diarrhea. The patient received weekly collaborative interdisciplinary team conferences to discuss estimated length of stay, family teaching, any barriers to discharge.  Therapies remained limited due to ongoing medical conditions.  He performed wheelchair mobility with upper extremities and supervision.  Berg balance reassessment, he had scored 5 points improvement, but still at high risk for falls.  Close monitoring of heart rate with increased activities to 123.  Ambulated in and out of the bathroom with rolling walker at minimal assistance, again needing rest breaks for fatigue factors.  He could gather belongings for activities of daily living, home making.  Needed some assistance for lower body dressing.  Arrangements made for discharge on Nov 30, 2015 with hospice care.  DISCHARGE MEDICATIONS: 1. Amiodarone 200 mg p.o. daily. 2. Folic acid 1 mg p.o. daily. 3. Chronulac 30 g p.o. daily. 4. Claritin 10 mg p.o. daily. 5. Morphine concentrate solution 5 mg every hour as needed for     moderate pain.  DIET:  Regular.  SPECIAL INSTRUCTIONS:  The patient to follow up per hospice care.     Bradley Dollar, P.A.     DA/MEDQ  D:  11/30/2015  T:  12/01/2015  Job:  [id number]  cc:   [name] Dr. Willaim Bane, DO [name] Dr. Alvira Monday, PA-C

## 2015-12-02 NOTE — Progress Notes (Signed)
Social Work Discharge Note  The overall goal for the admission was met for:   Discharge location: Yes - home  Length of Stay: Yes - 17 days  Discharge activity level: No - min - mod assist for OT; supervision for w/c and ambulation  Home/community participation: Yes  Services provided included: MD, RD, PT, OT, RN, Pharmacy, Neuropsych and SW  Financial Services: Private Insurance: [employer]  Follow-up services arranged: DME: 18"x18" w/c with basic cushion; walker; 3-in-1 commode from [place], Other: Hospice and [facility] to follow pt at home and Patient/Family has no preference for HH/DME agencies  Comments (or additional information): Pt decided he wanted to return home and declined any other options, including going to the [place] or [employer].  Pt's family came in to receive d/c instructions and pt agreed to hospice services at home.  They will evaluate how pt is doing at home and can always transfer him to [facility], should need arise.  Pt was adamant about going home, so agreeing to hospice care was the best f/u for him.    Patient/Family verbalized understanding of follow-up arrangements: Yes  Individual responsible for coordination of the follow-up plan: pt's daughters and his brother [name]  Confirmed correct DME delivered: Trey Sailors 12/02/2015    Naylee Frankowski, Silvestre Mesi

## 2015-12-08 ENCOUNTER — Ambulatory Visit: Payer: Federal, State, Local not specified - PPO | Admitting: Internal Medicine

## 2015-12-17 DEATH — deceased

## 2017-01-09 IMAGING — US US ABDOMEN COMPLETE
1 series · 13 of 13 positions shown · non-contrast
Comparison: 01/07/2011 CT abdomen/ pelvis.

CLINICAL DATA: Inpatient with cirrhosis, shock and renal failure.

EXAM:
ABDOMEN ULTRASOUND COMPLETE

[Series 1: us abdomen complete · 0.26mm/px · 13 of 13 slices shown]
[im 1/13]
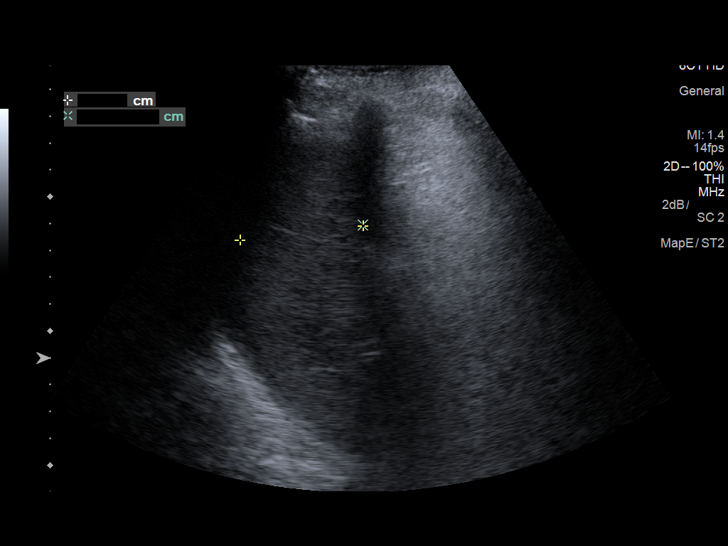
[im 2/13]
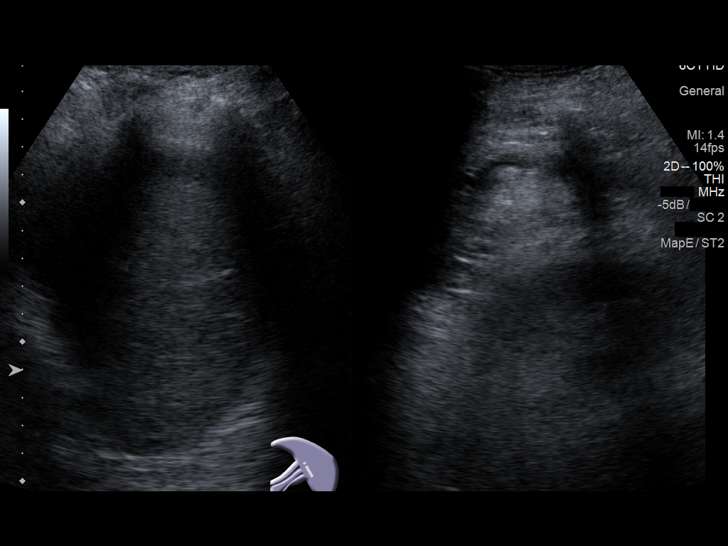
[im 3/13]
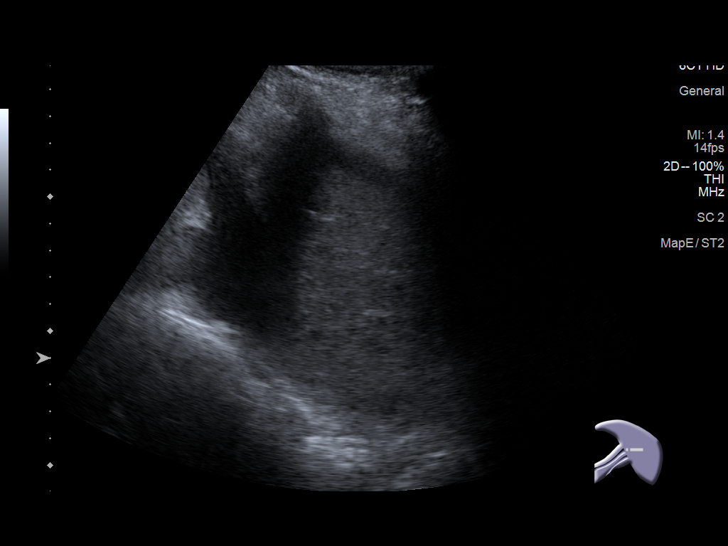
[im 4/13]
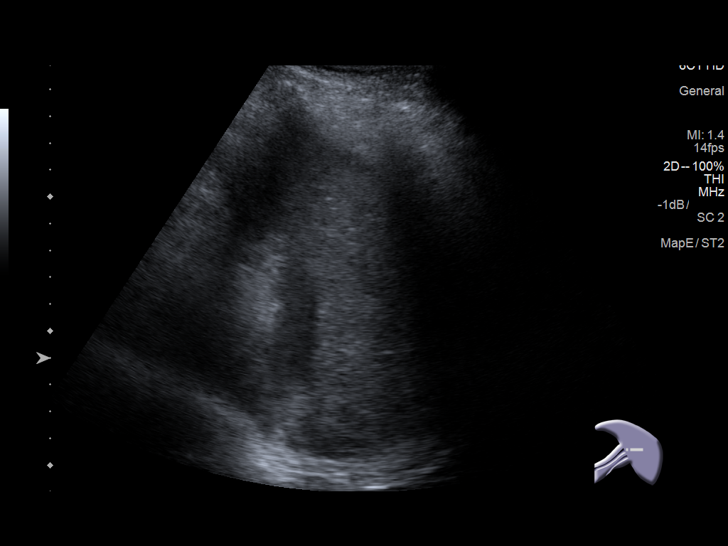
[im 5/13]
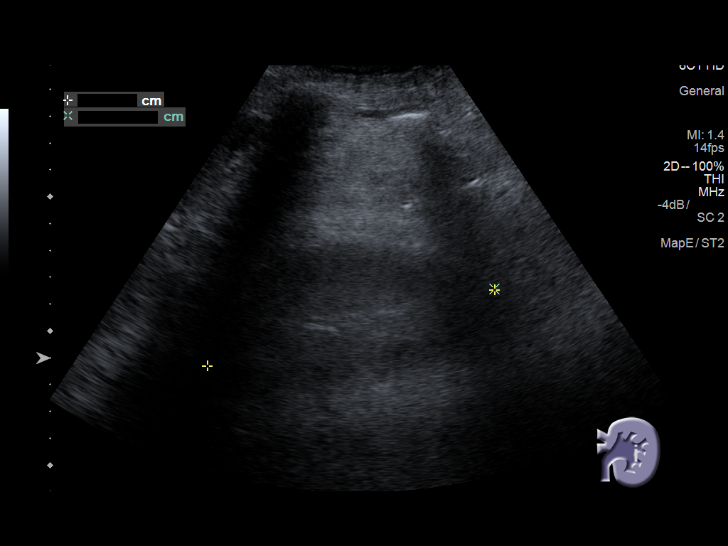
[im 6/13]
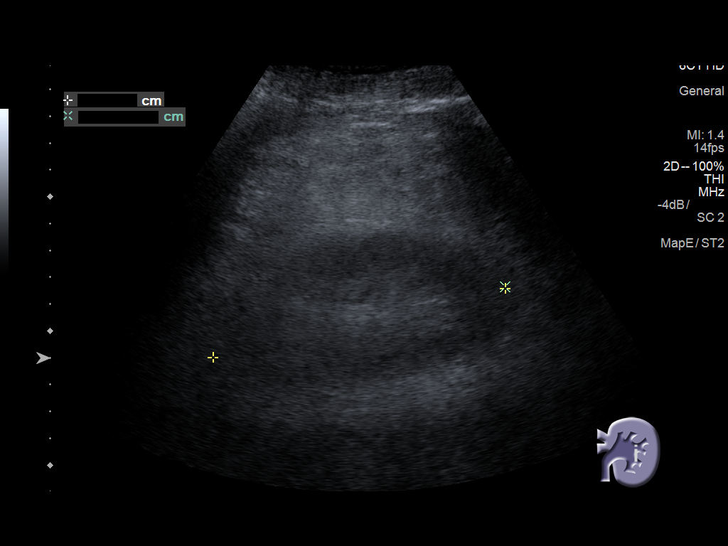
[im 7/13]
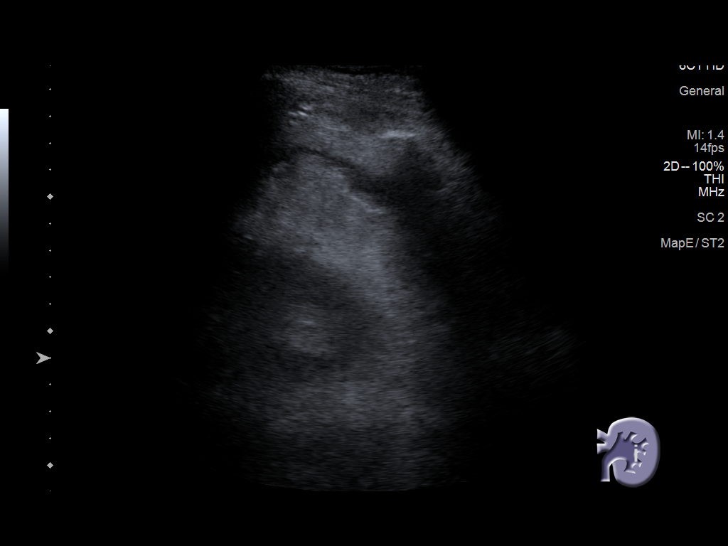
[im 8/13]
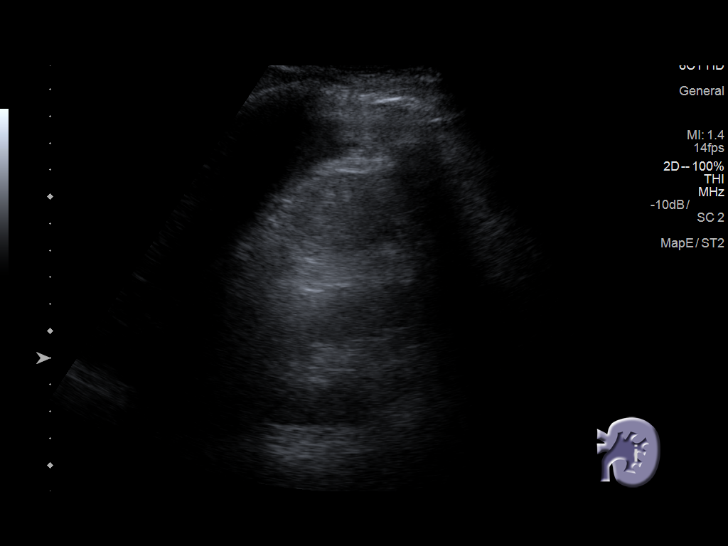
[im 9/13]
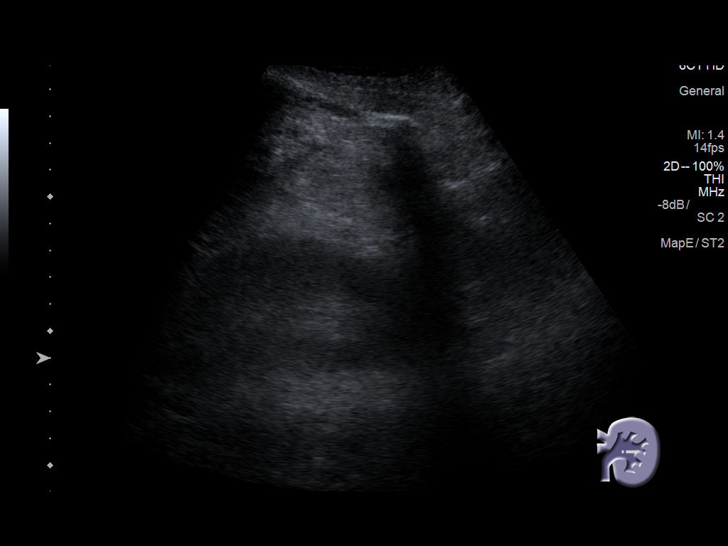
[im 10/13]
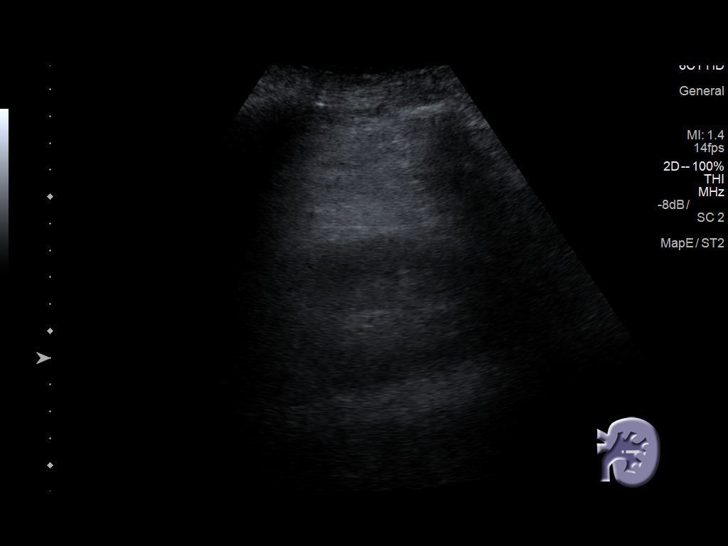
[im 11/13]
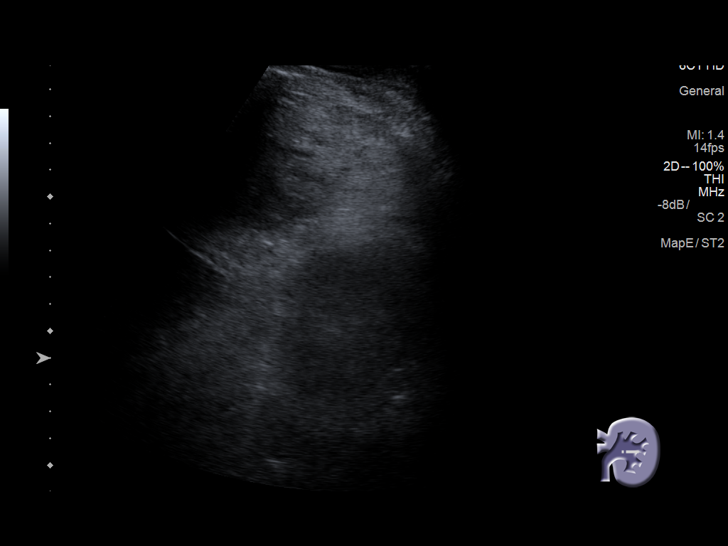
[im 12/13]
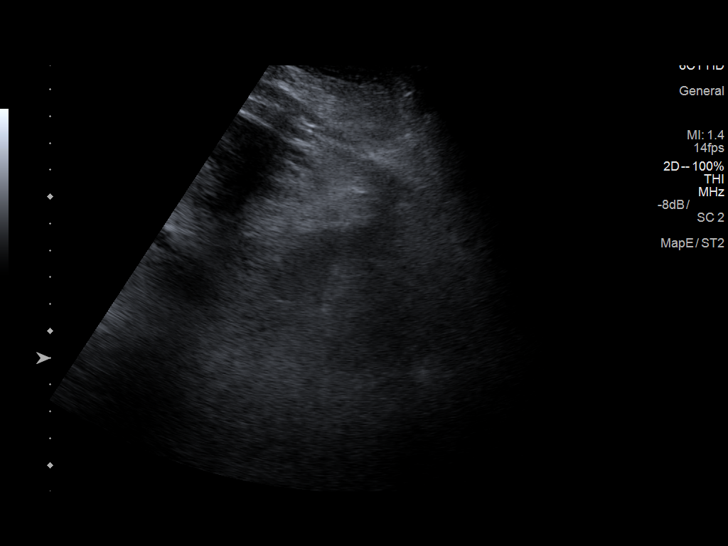
[im 13/13]
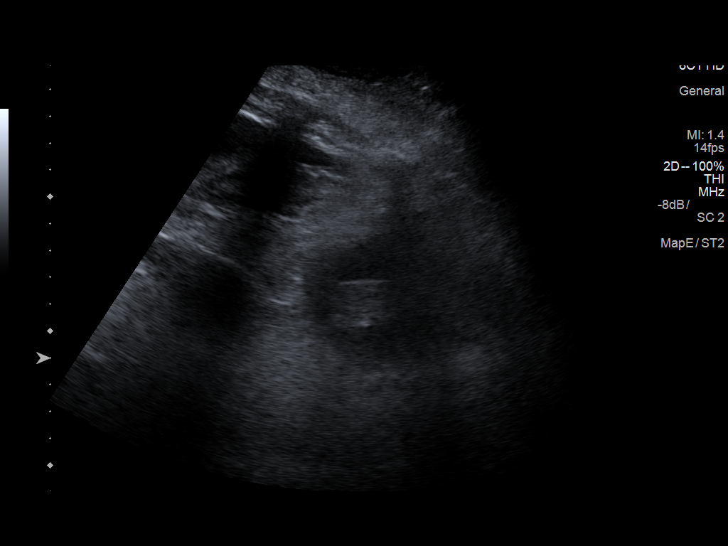

[13 of 13 positions shown; findings below may reference images not displayed]

FINDINGS: Gallbladder: No gallstones or wall thickening visualized. No
sonographic Murphy sign noted by sonographer.

Common bile duct: Diameter: 4 mm

Liver: Liver parenchyma is diffusely heterogeneous in echotexture in
the liver surface is diffusely irregular in contour, in keeping with
cirrhosis. There is reversal of flow in the main portal vein. No
liver masses demonstrated.

IVC: Not visualized.

Pancreas: Not well visualized due to overlying bowel gas and other
patient related factors for

Spleen: Size and appearance within normal limits.

Right Kidney: Length: 10.6 cm. Echogenicity within normal limits. No
mass or hydronephrosis visualized.

Left Kidney: Length: 11.2 cm. Echogenicity within normal limits. No
mass or hydronephrosis visualized.

Abdominal aorta: No aneurysm visualized.

Other findings: Moderate volume ascites.
IMPRESSION: 1. Cirrhosis.  No liver mass detected.
2. Reversed (hepatofugal) flow in the main portal vein. Moderate
volume ascites. No splenomegaly .
3. Normal gallbladder with no cholelithiasis. No biliary ductal
dilatation.

## 2017-06-23 IMAGING — CR DG CHEST 1V PORT
1 series · 1 of 1 positions shown · non-contrast
Comparison: Chest x-ray 11/13/2013.

CLINICAL DATA: 55-year-old male status post intubation.

EXAM:
PORTABLE CHEST 1 VIEW

[AP]
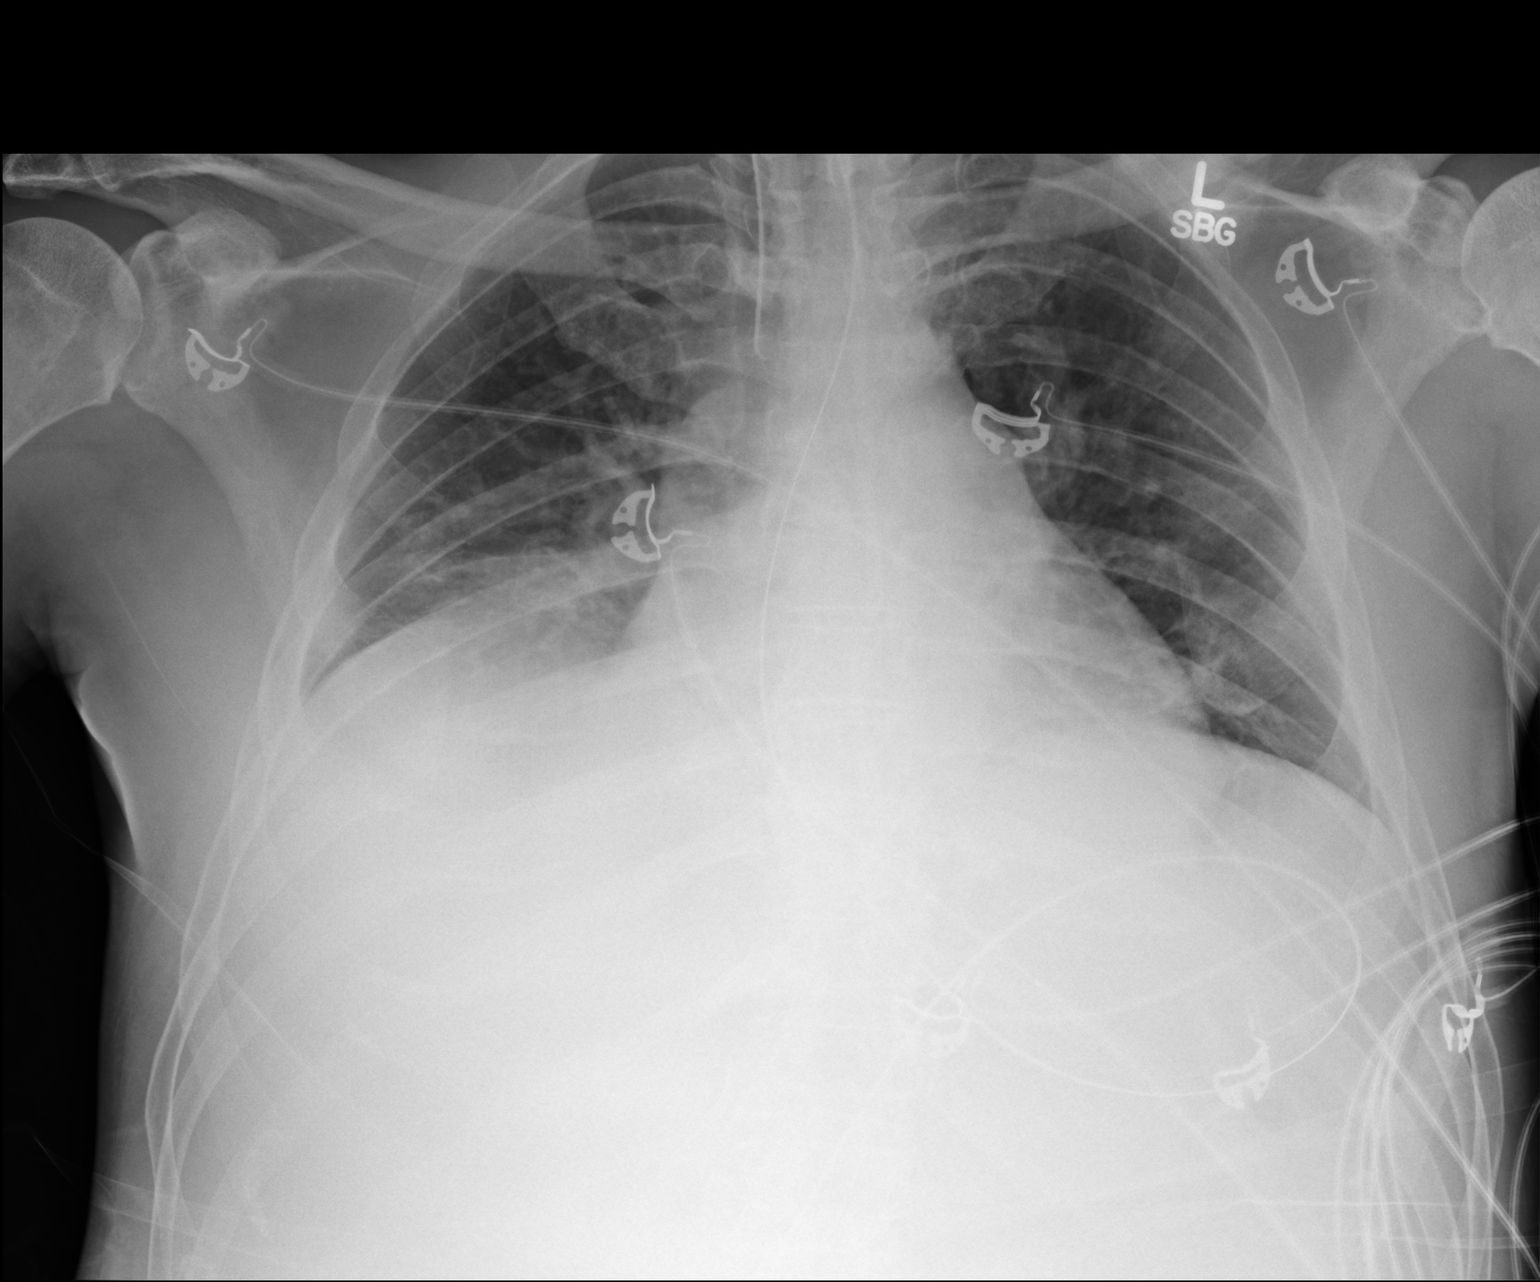

[1 of 1 positions shown; findings below may reference images not displayed]

FINDINGS: An endotracheal tube is in place with tip 2.8 cm above the carina. A
nasogastric tube is seen extending into the stomach, however, the
tip of the nasogastric tube extends below the lower margin of the
image. Lung volumes are very low. Opacity at the right base
presumably reflects atelectasis in the right middle and lower lobes.
Left lung appears relatively clear with minimal subsegmental
atelectasis in the left lower lobe. No definite pleural effusions.
No evidence of pulmonary edema. Heart size is normal. The patient is
rotated to the right on today's exam, resulting in distortion of the
mediastinal contours and reduced diagnostic sensitivity and
specificity for mediastinal pathology.
IMPRESSION: 1. Support apparatus, as above.
2. Low lung volumes with extensive atelectasis in the right middle
and lower lobes.

## 2017-06-24 IMAGING — CR DG CHEST 1V PORT
1 series · 1 of 1 positions shown · non-contrast
Comparison: Yesterday.

CLINICAL DATA: Central line placement.

EXAM:
PORTABLE CHEST 1 VIEW

[AP]
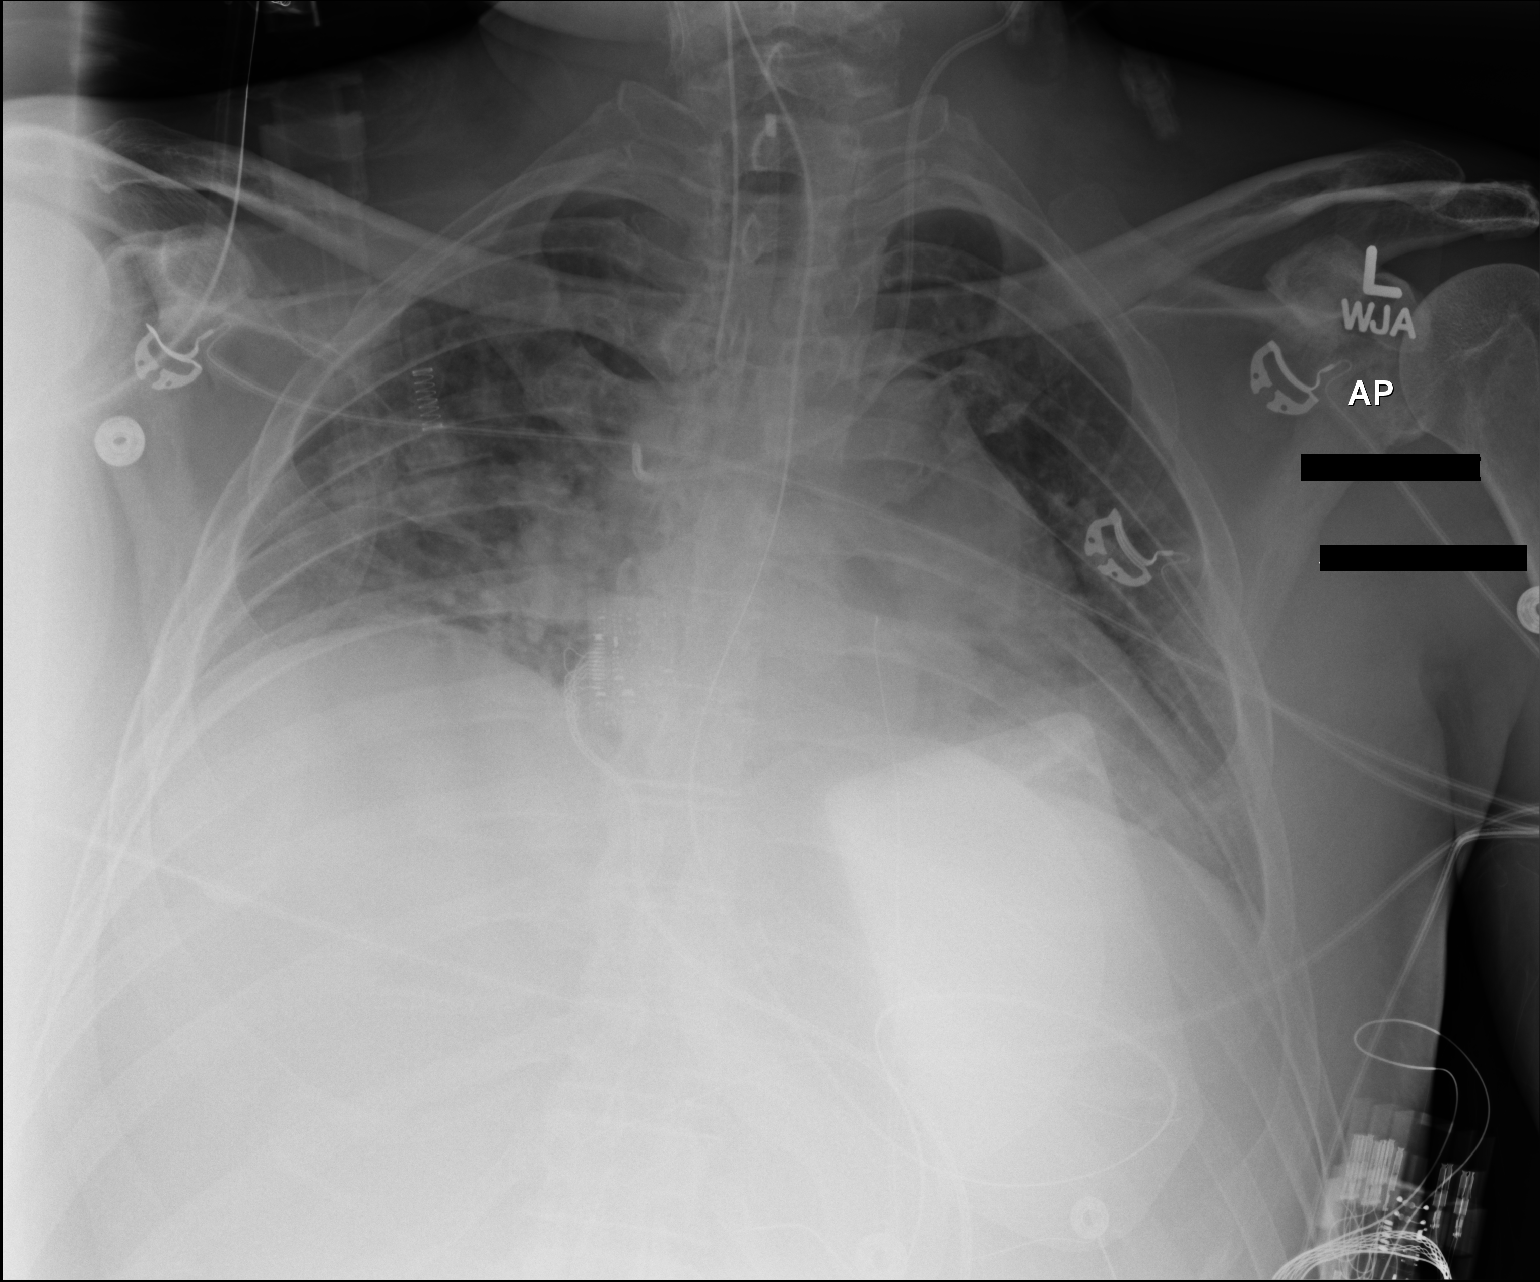

[1 of 1 positions shown; findings below may reference images not displayed]

FINDINGS: Endotracheal tube in satisfactory position. Nasogastric tube
extending into the stomach. Interval left jugular catheter with its
tip directed superiorly.

Poor inspiration. Normal sized heart. Mild increase in bibasilar
opacity. Unremarkable bones.
IMPRESSION: 1. Poor inspiration with increased bibasilar atelectasis.
2. Left jugular catheter tip directed superiorly. This may be in the
right innominate vein, azygos vein or proximal superior vena cava.
3. No pneumothorax.

## 2017-06-28 IMAGING — CR DG CHEST 1V PORT
1 series · 1 of 1 positions shown · non-contrast
Comparison: 11/02/2015

CLINICAL DATA: Dyspnea

EXAM:
PORTABLE CHEST 1 VIEW

[AP]
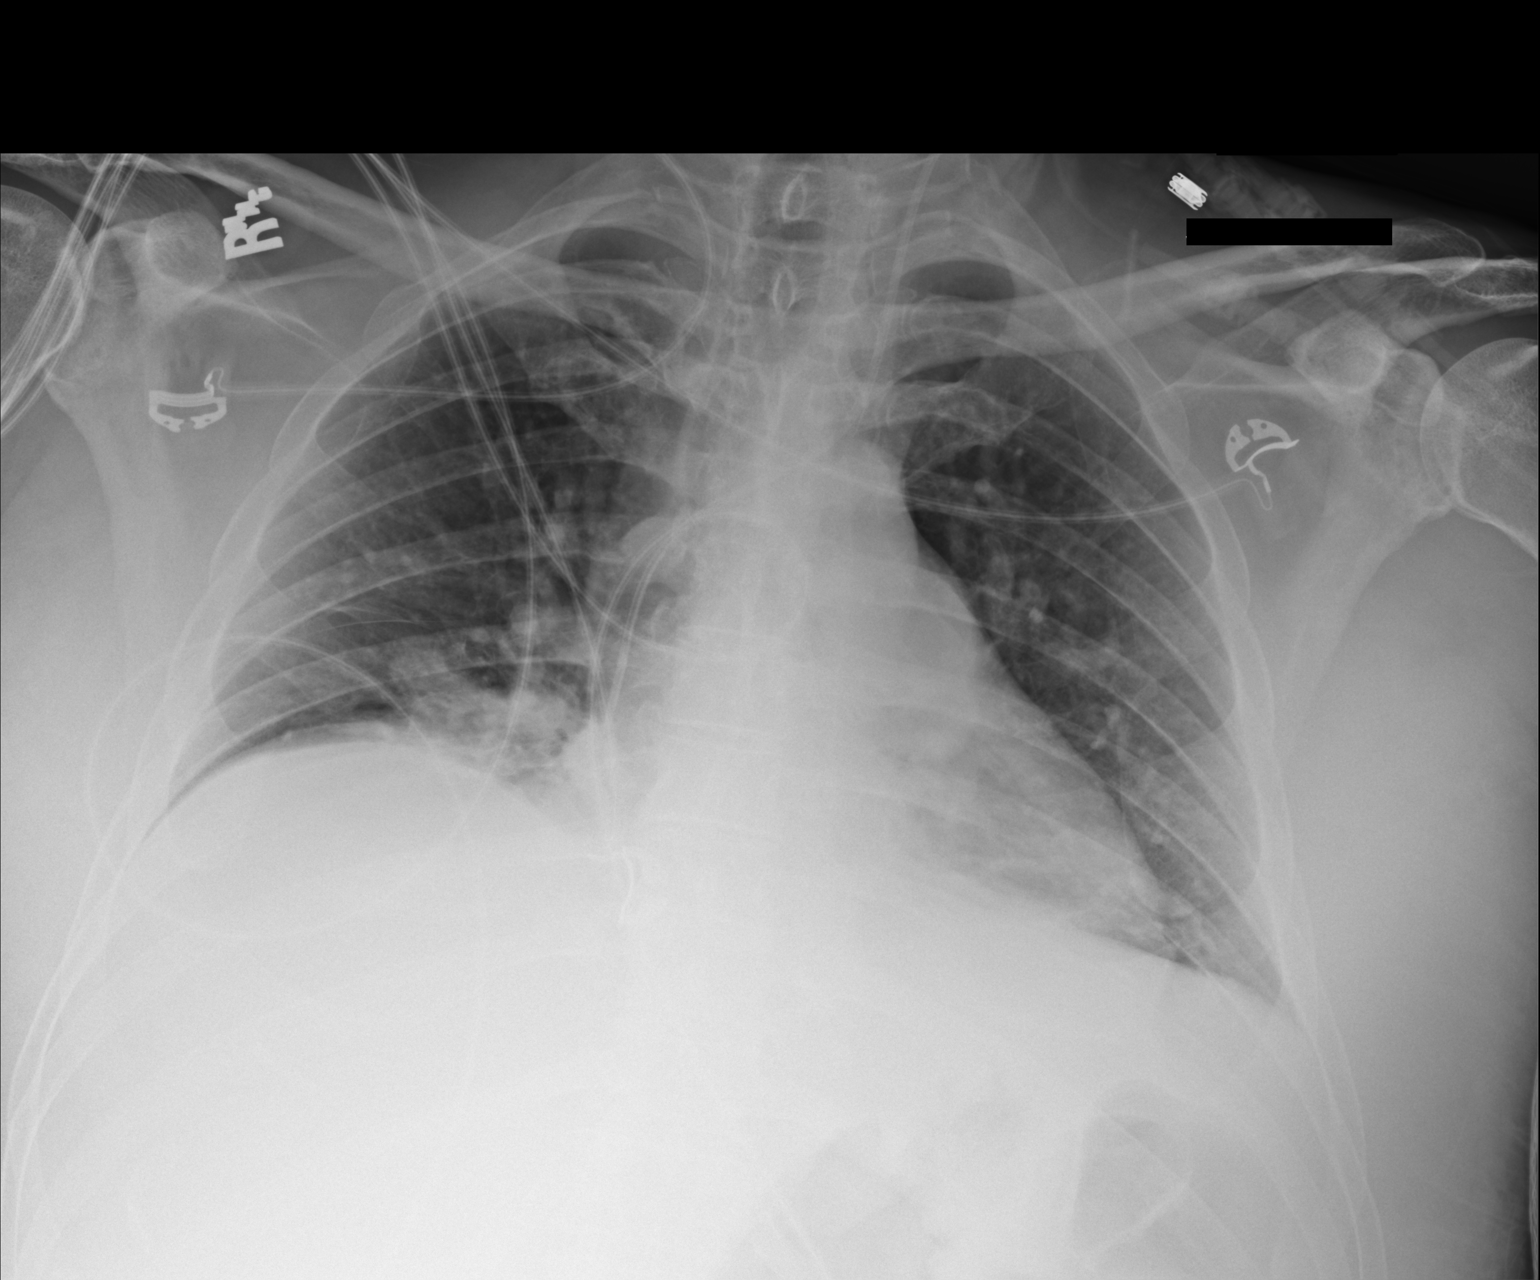

[1 of 1 positions shown; findings below may reference images not displayed]

FINDINGS: Left internal jugular central line again identified. Tip obscured by
overlying EKG leads. Heart size and vascular pattern normal. Low
lung volumes. Bilateral lower lobe opacities right worse than left.
There has been mild improvement in bilateral lung base aeration.
IMPRESSION: Persistent but improved bibasilar opacities.

## 2017-06-29 IMAGING — CR DG CHEST 1V PORT
1 series · 1 of 1 positions shown · non-contrast
Comparison: Portable chest x-ray November 05, 2015

CLINICAL DATA: Pulmonary edema, acute respiratory failure,
cirrhosis, renal failure

EXAM:
PORTABLE CHEST 1 VIEW

[AP]
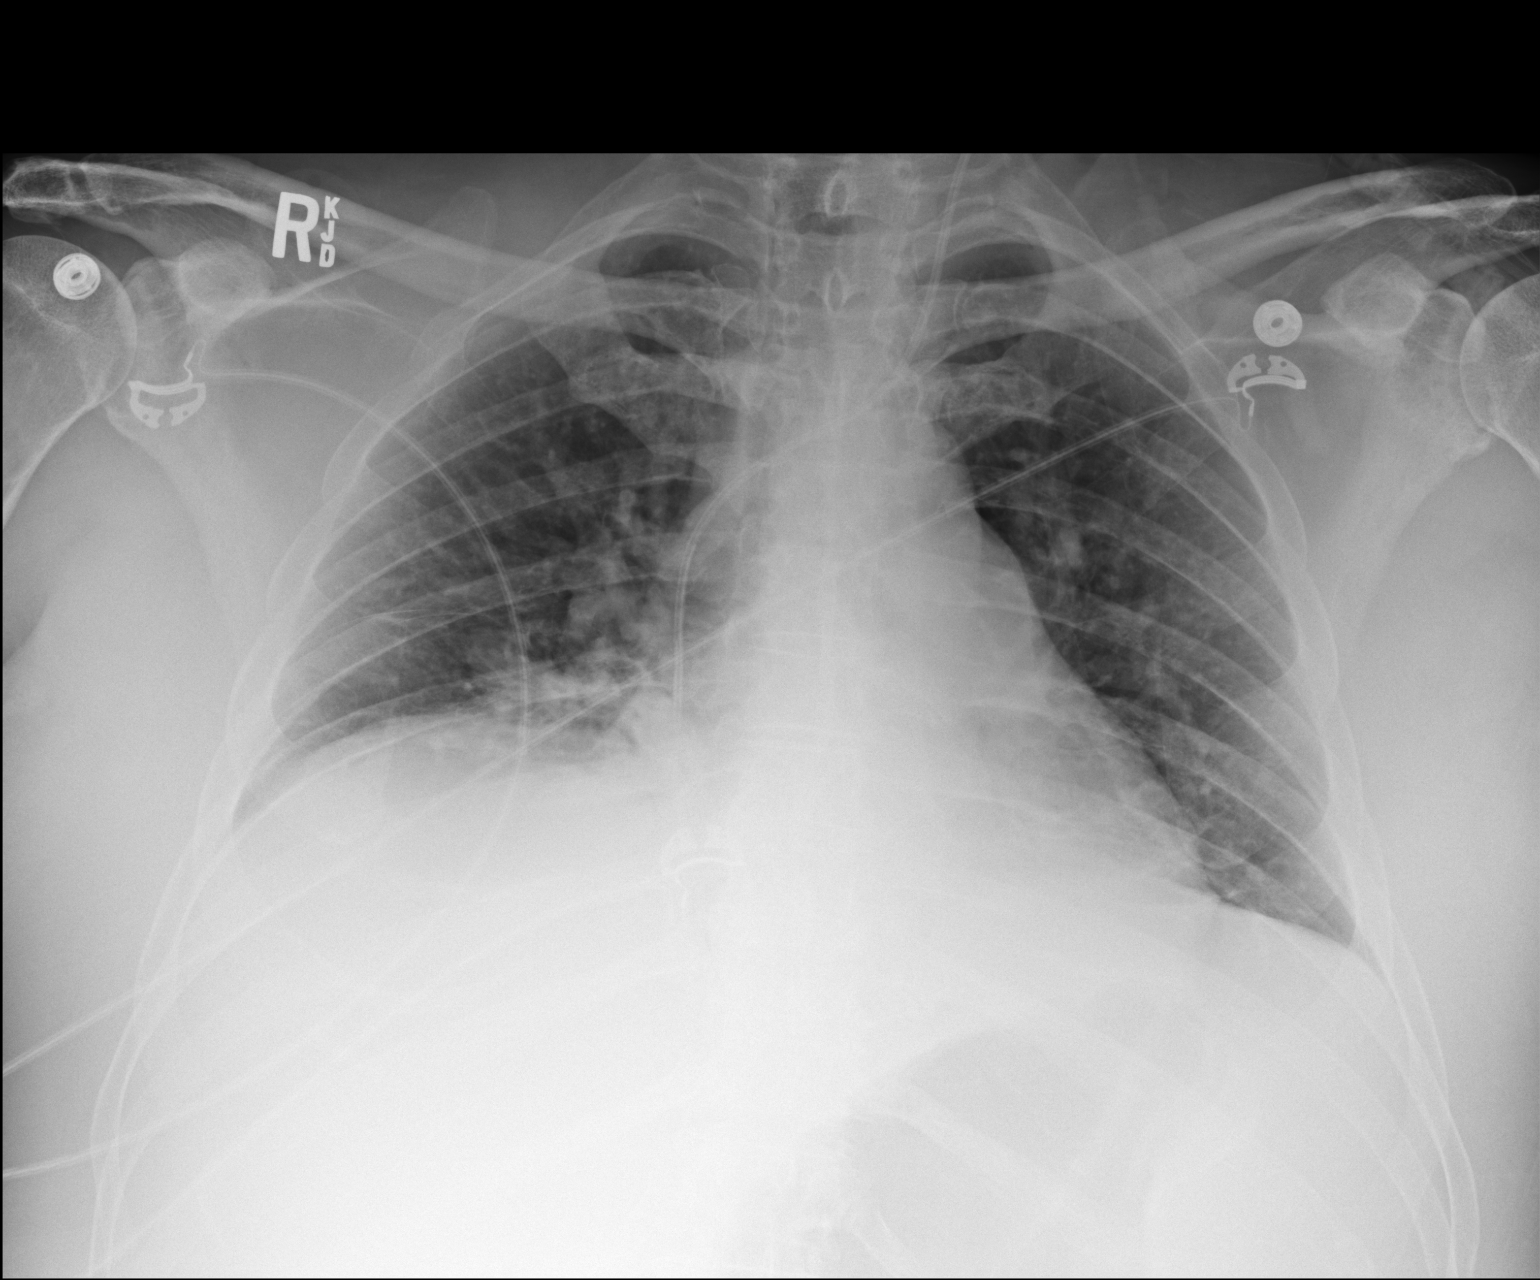

[1 of 1 positions shown; findings below may reference images not displayed]

FINDINGS: The lungs are mildly hypoinflated. Parenchymal density medially at
the right lung base persists. The cardiac silhouette is top-normal
in size. The central pulmonary vascularity is mildly prominent. No
pleural effusion or pneumothorax is observed. The left internal
jugular venous catheter tip projects over the mid portion of the
SVC.
IMPRESSION: Mild pulmonary interstitial prominence stable likely reflecting mild
interstitial edema. Confluent alveolar opacity at the right lung
base medially consistent with atelectasis or pneumonia. No
significant pleural effusion and no pneumothorax.
